# Patient Record
Sex: Female | Born: 1960 | Race: White | Hispanic: No | Marital: Married | Smoking: Never smoker
Health system: Southern US, Community
[De-identification: ages and names within clinical notes are randomized; demographics above are authoritative.]

## PROBLEM LIST (undated history)

## (undated) DIAGNOSIS — C44310 Basal cell carcinoma of skin of unspecified parts of face: Secondary | ICD-10-CM

## (undated) DIAGNOSIS — Z8041 Family history of malignant neoplasm of ovary: Secondary | ICD-10-CM

## (undated) DIAGNOSIS — Z780 Asymptomatic menopausal state: Secondary | ICD-10-CM

## (undated) DIAGNOSIS — N898 Other specified noninflammatory disorders of vagina: Principal | ICD-10-CM

## (undated) DIAGNOSIS — B9689 Other specified bacterial agents as the cause of diseases classified elsewhere: Secondary | ICD-10-CM

## (undated) DIAGNOSIS — N760 Acute vaginitis: Secondary | ICD-10-CM

## (undated) DIAGNOSIS — C4492 Squamous cell carcinoma of skin, unspecified: Secondary | ICD-10-CM

## (undated) DIAGNOSIS — Z808 Family history of malignant neoplasm of other organs or systems: Secondary | ICD-10-CM

## (undated) DIAGNOSIS — Z803 Family history of malignant neoplasm of breast: Secondary | ICD-10-CM

## (undated) DIAGNOSIS — Z923 Personal history of irradiation: Secondary | ICD-10-CM

## (undated) DIAGNOSIS — F329 Major depressive disorder, single episode, unspecified: Principal | ICD-10-CM

## (undated) DIAGNOSIS — Z800 Family history of malignant neoplasm of digestive organs: Secondary | ICD-10-CM

## (undated) DIAGNOSIS — D649 Anemia, unspecified: Secondary | ICD-10-CM

## (undated) DIAGNOSIS — N952 Postmenopausal atrophic vaginitis: Principal | ICD-10-CM

## (undated) HISTORY — DX: Family history of malignant neoplasm of digestive organs: Z80.0

## (undated) HISTORY — DX: Postmenopausal atrophic vaginitis: N95.2

## (undated) HISTORY — DX: Asymptomatic menopausal state: Z78.0

## (undated) HISTORY — DX: Basal cell carcinoma of skin of unspecified parts of face: C44.310

## (undated) HISTORY — PX: BREAST ENHANCEMENT SURGERY: SHX7

## (undated) HISTORY — DX: Personal history of irradiation: Z92.3

## (undated) HISTORY — PX: OTHER SURGICAL HISTORY: SHX169

## (undated) HISTORY — DX: Other specified bacterial agents as the cause of diseases classified elsewhere: B96.89

## (undated) HISTORY — DX: Acute vaginitis: N76.0

## (undated) HISTORY — DX: Major depressive disorder, single episode, unspecified: F32.9

## (undated) HISTORY — DX: Other specified noninflammatory disorders of vagina: N89.8

## (undated) HISTORY — DX: Family history of malignant neoplasm of breast: Z80.3

## (undated) HISTORY — DX: Family history of malignant neoplasm of other organs or systems: Z80.8

## (undated) HISTORY — DX: Squamous cell carcinoma of skin, unspecified: C44.92

## (undated) HISTORY — PX: CARPAL TUNNEL RELEASE: SHX101

## (undated) HISTORY — DX: Family history of malignant neoplasm of ovary: Z80.41

---

## 2001-10-18 ENCOUNTER — Encounter: Payer: Self-pay | Admitting: Family Medicine

## 2001-10-18 ENCOUNTER — Ambulatory Visit (HOSPITAL_COMMUNITY): Admission: RE | Admit: 2001-10-18 | Discharge: 2001-10-18 | Payer: Self-pay | Admitting: Family Medicine

## 2004-12-04 ENCOUNTER — Ambulatory Visit (HOSPITAL_COMMUNITY): Admission: RE | Admit: 2004-12-04 | Discharge: 2004-12-04 | Payer: Self-pay | Admitting: Family Medicine

## 2005-10-21 ENCOUNTER — Ambulatory Visit (HOSPITAL_COMMUNITY): Admission: RE | Admit: 2005-10-21 | Discharge: 2005-10-21 | Payer: Self-pay | Admitting: Family Medicine

## 2006-09-16 ENCOUNTER — Emergency Department (HOSPITAL_COMMUNITY): Admission: EM | Admit: 2006-09-16 | Discharge: 2006-09-16 | Payer: Self-pay | Admitting: Emergency Medicine

## 2006-10-06 ENCOUNTER — Ambulatory Visit (HOSPITAL_COMMUNITY): Admission: RE | Admit: 2006-10-06 | Discharge: 2006-10-06 | Payer: Self-pay | Admitting: Family Medicine

## 2006-10-22 ENCOUNTER — Ambulatory Visit: Payer: Self-pay | Admitting: Orthopaedic Surgery

## 2006-10-26 ENCOUNTER — Ambulatory Visit: Payer: Self-pay | Admitting: Orthopaedic Surgery

## 2008-06-21 ENCOUNTER — Ambulatory Visit (HOSPITAL_COMMUNITY): Admission: RE | Admit: 2008-06-21 | Discharge: 2008-06-21 | Payer: Self-pay | Admitting: Family Medicine

## 2008-10-26 ENCOUNTER — Ambulatory Visit (HOSPITAL_COMMUNITY): Admission: RE | Admit: 2008-10-26 | Discharge: 2008-10-26 | Payer: Self-pay | Admitting: Family Medicine

## 2009-04-25 ENCOUNTER — Encounter: Admission: RE | Admit: 2009-04-25 | Discharge: 2009-04-25 | Payer: Self-pay | Admitting: Family Medicine

## 2010-01-14 ENCOUNTER — Ambulatory Visit (HOSPITAL_COMMUNITY): Admission: RE | Admit: 2010-01-14 | Discharge: 2010-01-14 | Payer: Self-pay | Admitting: Family Medicine

## 2010-04-26 ENCOUNTER — Encounter: Admission: RE | Admit: 2010-04-26 | Discharge: 2010-04-26 | Payer: Self-pay | Admitting: Family Medicine

## 2011-03-03 ENCOUNTER — Ambulatory Visit (HOSPITAL_BASED_OUTPATIENT_CLINIC_OR_DEPARTMENT_OTHER)
Admission: RE | Admit: 2011-03-03 | Discharge: 2011-03-03 | Disposition: A | Discharge status: Home / Self Care | Payer: Managed Care, Other (non HMO) | Source: Ambulatory Visit | Attending: Orthopedic Surgery | Admitting: Orthopedic Surgery

## 2011-03-03 DIAGNOSIS — Z01812 Encounter for preprocedural laboratory examination: Secondary | ICD-10-CM | POA: Insufficient documentation

## 2011-03-03 DIAGNOSIS — G5600 Carpal tunnel syndrome, unspecified upper limb: Secondary | ICD-10-CM | POA: Insufficient documentation

## 2011-03-25 NOTE — Op Note (Signed)
  Melissa Mcgrath, MARION NO.:  [id number]  MEDICAL RECORD NO.:  [id number]  LOCATION:                                 FACILITY:  PHYSICIAN:  Cindee Salt, M.D.       DATE OF BIRTH:  August 23, 1960  DATE OF PROCEDURE:  03/03/2011 DATE OF DISCHARGE:                              OPERATIVE REPORT   PREOPERATIVE DIAGNOSIS:  Carpal tunnel syndrome, right hand.  POSTOPERATIVE DIAGNOSIS:  Carpal tunnel syndrome, right hand.  OPERATION:  Decompression of right median nerve.  SURGEON:  Cindee Salt, MD  ASSISTANT:  None.  ANESTHESIA:  Forearm based IV regional local infiltration.  DATE OF OPERATION:  March 03, 2011  ANESTHESIOLOGIST:  Janetta Hora. Frederick, MD.  HISTORY:  The patient is a 50 year old female with a history of carpal tunnel syndrome, EMG nerve conductions positive which has not responded to conservative treatment.  She has elected to undergo surgical decompression.  Pre, peri, and postoperative course have been discussed along with risks and complications.  She is aware there is no guarantee with the surgery, possibility of infection, recurrence injury to arteries, nerves, tendons incomplete relief of symptoms, dystrophy. Preoperative area, the patient was seen.  The extremity was marked by both the patient and surgeon.  Antibiotic given.  PROCEDURE:  The patient was brought to the operating room where a forearm based IV regional anesthetic was carried out without difficulty. She was prepped using ChloraPrep, supine position, right arm free.  A 3- minute dry time was allowed.  Time-out taken confirming the patient procedure.  A longitudinal incision was made in the palm, carried down through subcutaneous tissue.  Bleeders were electrocauterized.  The palmar fascia was split.  Superficial palmar arch identified.  The flexor tendon to the ring little finger identified to the ulnar side of median nerve.  The carpal retinaculum was incised with sharp  dissection. Right angle and Sewall retractor were placed between skin and forearm fascia.  Fascia was released for approximately a centimeter and half proximal to the wrist crease under direct vision.  Canal was explored. Air compression to the nerve was apparent.  No further lesions were identified.  The wound was irrigated.  The skin was then closed with interrupted 4-0 Vicryl Rapide sutures.  A local infiltration with 0.25% Marcaine without epinephrine was given.  Approximately 5 mL was used.  A sterile compressive dressing was applied with the fingers free.  On deflation of the tourniquet, all fingers immediately pinked.  She was taken to the recovery room for observation in satisfactory condition.  She will be discharged home to return to the [facility] [place] in 1 week on Vicodin.          ______________________________ Cindee Salt, M.D.     GK/MEDQ  D:  03/03/2011  T:  03/04/2011  Job:  [id number]  Electronically Signed by Cindee Salt M.D. on 03/25/2011 04:39:44 PM

## 2011-05-26 ENCOUNTER — Other Ambulatory Visit: Payer: Self-pay | Admitting: Family Medicine

## 2011-05-26 DIAGNOSIS — Z1231 Encounter for screening mammogram for malignant neoplasm of breast: Secondary | ICD-10-CM

## 2011-06-18 ENCOUNTER — Ambulatory Visit: Payer: Managed Care, Other (non HMO)

## 2011-07-03 ENCOUNTER — Ambulatory Visit
Admission: RE | Admit: 2011-07-03 | Discharge: 2011-07-03 | Disposition: A | Discharge status: Home / Self Care | Payer: Managed Care, Other (non HMO) | Source: Ambulatory Visit | Attending: Family Medicine | Admitting: Family Medicine

## 2011-07-03 DIAGNOSIS — Z1231 Encounter for screening mammogram for malignant neoplasm of breast: Secondary | ICD-10-CM

## 2012-10-18 ENCOUNTER — Other Ambulatory Visit: Payer: Self-pay

## 2012-10-18 DIAGNOSIS — Z1231 Encounter for screening mammogram for malignant neoplasm of breast: Secondary | ICD-10-CM

## 2012-11-11 ENCOUNTER — Ambulatory Visit
Admission: RE | Admit: 2012-11-11 | Discharge: 2012-11-11 | Disposition: A | Discharge status: Home / Self Care | Payer: No Typology Code available for payment source | Source: Ambulatory Visit

## 2012-11-11 DIAGNOSIS — Z1231 Encounter for screening mammogram for malignant neoplasm of breast: Secondary | ICD-10-CM

## 2013-03-06 ENCOUNTER — Emergency Department: Payer: Self-pay | Admitting: Emergency Medicine

## 2013-12-20 ENCOUNTER — Encounter: Payer: Self-pay | Admitting: Nurse Practitioner

## 2013-12-20 ENCOUNTER — Ambulatory Visit (INDEPENDENT_AMBULATORY_CARE_PROVIDER_SITE_OTHER): Payer: No Typology Code available for payment source | Admitting: Nurse Practitioner

## 2013-12-20 VITALS — BP 112/76 | Temp 98.4°F | Ht 68.0 in | Wt 153.0 lb

## 2013-12-20 DIAGNOSIS — G609 Hereditary and idiopathic neuropathy, unspecified: Secondary | ICD-10-CM

## 2013-12-20 DIAGNOSIS — R5383 Other fatigue: Principal | ICD-10-CM

## 2013-12-20 DIAGNOSIS — Z13220 Encounter for screening for lipoid disorders: Secondary | ICD-10-CM

## 2013-12-20 DIAGNOSIS — G629 Polyneuropathy, unspecified: Secondary | ICD-10-CM

## 2013-12-20 DIAGNOSIS — R5381 Other malaise: Secondary | ICD-10-CM

## 2013-12-20 DIAGNOSIS — R55 Syncope and collapse: Secondary | ICD-10-CM

## 2013-12-20 DIAGNOSIS — R110 Nausea: Secondary | ICD-10-CM

## 2013-12-20 LAB — HEPATIC FUNCTION PANEL
ALBUMIN: 4.2 g/dL (ref 3.5–5.2)
ALT: 13 U/L (ref 0–35)
AST: 16 U/L (ref 0–37)
Alkaline Phosphatase: 83 U/L (ref 39–117)
Bilirubin, Direct: 0.1 mg/dL (ref 0.0–0.3)
Indirect Bilirubin: 0.4 mg/dL (ref 0.2–1.2)
Total Bilirubin: 0.5 mg/dL (ref 0.2–1.2)
Total Protein: 7 g/dL (ref 6.0–8.3)

## 2013-12-20 LAB — CBC WITH DIFFERENTIAL/PLATELET
BASOS ABS: 0 10*3/uL (ref 0.0–0.1)
BASOS PCT: 1 % (ref 0–1)
EOS ABS: 0.1 10*3/uL (ref 0.0–0.7)
Eosinophils Relative: 2 % (ref 0–5)
HEMATOCRIT: 37.3 % (ref 36.0–46.0)
HEMOGLOBIN: 12.8 g/dL (ref 12.0–15.0)
Lymphocytes Relative: 36 % (ref 12–46)
Lymphs Abs: 1.7 10*3/uL (ref 0.7–4.0)
MCH: 31 pg (ref 26.0–34.0)
MCHC: 34.3 g/dL (ref 30.0–36.0)
MCV: 90.3 fL (ref 78.0–100.0)
MONO ABS: 0.5 10*3/uL (ref 0.1–1.0)
MONOS PCT: 10 % (ref 3–12)
NEUTROS ABS: 2.4 10*3/uL (ref 1.7–7.7)
NEUTROS PCT: 51 % (ref 43–77)
Platelets: 253 10*3/uL (ref 150–400)
RBC: 4.13 MIL/uL (ref 3.87–5.11)
RDW: 13.1 % (ref 11.5–15.5)
WBC: 4.7 10*3/uL (ref 4.0–10.5)

## 2013-12-20 LAB — LIPID PANEL
CHOLESTEROL: 184 mg/dL (ref 0–200)
HDL: 51 mg/dL (ref >39)
LDL Cholesterol: 117 mg/dL — ABNORMAL HIGH (ref 0–99)
TRIGLYCERIDES: 82 mg/dL (ref <150)
Total CHOL/HDL Ratio: 3.6 Ratio
VLDL: 16 mg/dL (ref 0–40)

## 2013-12-20 LAB — BASIC METABOLIC PANEL
BUN: 10 mg/dL (ref 6–23)
CALCIUM: 9.3 mg/dL (ref 8.4–10.5)
CHLORIDE: 105 meq/L (ref 96–112)
CO2: 29 meq/L (ref 19–32)
CREATININE: 0.76 mg/dL (ref 0.50–1.10)
GLUCOSE: 88 mg/dL (ref 70–99)
Potassium: 4.1 mEq/L (ref 3.5–5.3)
Sodium: 142 mEq/L (ref 135–145)

## 2013-12-20 LAB — FOLATE: FOLATE: 18.4 ng/mL

## 2013-12-20 LAB — POCT URINE PREGNANCY: PREG TEST UR: NEGATIVE

## 2013-12-20 LAB — VITAMIN B12: VITAMIN B 12: 380 pg/mL (ref 211–911)

## 2013-12-20 LAB — TSH: TSH: 1.932 u[IU]/mL (ref 0.350–4.500)

## 2013-12-20 NOTE — Patient Instructions (Signed)
Start daily MVI Zantac 150 mg one at night

## 2013-12-21 LAB — VITAMIN D 25 HYDROXY (VIT D DEFICIENCY, FRACTURES): VIT D 25 HYDROXY: 64 ng/mL (ref 30–89)

## 2013-12-23 ENCOUNTER — Encounter: Payer: Self-pay | Admitting: Nurse Practitioner

## 2013-12-24 ENCOUNTER — Encounter: Payer: Self-pay | Admitting: Nurse Practitioner

## 2013-12-24 NOTE — Progress Notes (Signed)
Subjective:  Presents with complaints of prolonged fatigue over the past year. Has had 2 episodes of syncope/near-syncope. Did not have symptoms of heat stroke. Possible heat exhaustion. The first episode was about a year ago while she was at a horse show occurred after eating and drinking. Began feeling faint, "could not hear",.in her vision. According to patient she fainted briefly. Symptoms improved after sitting in air conditioning. The last episode was 3-4 weeks ago at another or show. Felt hot and clammy with Dotson her vision again. Did not pass out this time. Had been eating and drinking that day as well. No menstrual cycle, patient had her Nexplanon removed. No headaches. No fevers. Nausea but no vomiting mainly in the mornings. No weakness of the face arms or legs. No difficulty speaking or swallowing. No chest pain/ischemic type pain shortness of breath or palpitations. Some cold intolerance. Off-and-on pain in both feet, slight numbness on the left side for the past 3-4 weeks. No difficulty walking. Denies back pain. 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 Has been under a lot more stress due to her husband's health issues. Social isolation. Lack of interest in her usual activities including her horses which is very unusual. No abdominal pain. No acid reflux or heartburn.  Objective:   BP 112/76  Temp(Src) 98.4 F (36.9 C) (Oral)  Ht 5\' 8"  (1.727 m)  Wt 153 lb (69.4 kg)  BMI 23.27 kg/m2 NAD. Alert, oriented. Thyroid normal limit to palpation and nontender. Lungs clear. Heart regular rate rhythm. Lower extremities no edema. Strong DP pulses bilaterally. Toes warm with good capillary refill. Decreased sensation on the third and fourth toes left foot with monofilament test. Abdomen soft nondistended nontender.  Assessment: Other malaise and fatigue - Plan: Basic metabolic panel, CBC with Differential, Hepatic function panel, TSH, Vitamin B12, Vit D  25  hydroxy (rtn osteoporosis monitoring), Folate  Peripheral neuropathy - Plan: CBC with Differential, Vitamin B12, Vit D  25 hydroxy (rtn osteoporosis monitoring), Folate  Screening for lipid disorders - Plan: Lipid panel  Nausea alone - Plan: POCT urine pregnancy  Plan: Labs pending. Recommend daily multivitamin. Feel that stress is contributing to her malaise and fatigue. Consider starting daily medication if lab work is normal. Trial of Zantac to see if this will help nausea. Warning signs reviewed. Call back sooner if symptoms worsen.

## 2013-12-27 ENCOUNTER — Encounter: Payer: Self-pay | Admitting: Nurse Practitioner

## 2014-01-02 ENCOUNTER — Ambulatory Visit: Payer: No Typology Code available for payment source | Admitting: Nurse Practitioner

## 2014-01-05 ENCOUNTER — Ambulatory Visit (INDEPENDENT_AMBULATORY_CARE_PROVIDER_SITE_OTHER): Payer: No Typology Code available for payment source | Admitting: Adult Health

## 2014-01-05 ENCOUNTER — Encounter: Payer: Self-pay | Admitting: Adult Health

## 2014-01-05 VITALS — BP 102/70 | Ht 68.0 in | Wt 154.5 lb

## 2014-01-05 DIAGNOSIS — F3289 Other specified depressive episodes: Secondary | ICD-10-CM

## 2014-01-05 DIAGNOSIS — N951 Menopausal and female climacteric states: Secondary | ICD-10-CM

## 2014-01-05 DIAGNOSIS — F32A Depression, unspecified: Secondary | ICD-10-CM

## 2014-01-05 DIAGNOSIS — Z780 Asymptomatic menopausal state: Secondary | ICD-10-CM

## 2014-01-05 DIAGNOSIS — F329 Major depressive disorder, single episode, unspecified: Secondary | ICD-10-CM

## 2014-01-05 HISTORY — DX: Asymptomatic menopausal state: Z78.0

## 2014-01-05 HISTORY — DX: Depression, unspecified: F32.A

## 2014-01-05 MED ORDER — ESCITALOPRAM OXALATE 10 MG PO TABS: 10.0000 mg | ORAL_TABLET | Freq: Every day | ORAL | Status: DC

## 2014-01-05 NOTE — Progress Notes (Signed)
Subjective:     Patient ID: Melissa Mcgrath, female   DOB: 10/20/1960, 53 y.o.   MRN: [id number]  HPI Melissa Mcgrath is a 53 year old white female, married, in complaining of no energy, decrease interest in things she used to like, even her horses and does not want to go places, would rather be home,decrease libido,teary easily and "ill" at times, esp with husband, and her feet hurt.also nausea in am.She denies hot flashes, or night sweats, is actually more cold.No problems sleeping or with memory.Denies chest pain or shortness of breath, has had episodes at horse show where feels faint and did faint once. She has not had a period since nexplanon removed 1 year ago.She works on a farm and Advertising account executive.Her husband has been retired 3 years and has had health issues.She has 2 grown daughters and they are well.She has seen C.Hoskins NP 12/20/13 and had labs that were normal.  Review of Systems See HPI Reviewed past medical,surgical, social and family history. Reviewed medications and allergies.     Objective:   Physical Exam BP 102/70  Ht 5\' 8"  (1.727 m)  Wt 154 lb 8 oz (70.081 kg)  BMI 23.50 kg/m2   Skin warm and dry and tan, feet appear normal no lesions but she says they hurt and cramp, Hoyle Sauer just did exam, feels better in boots or flip flops.Talked about depression/stress and menopause.Discussed HRT and also SSRIs.since she is not having hot flashes,or night sweats, will try lexapro first, but if not better HRT is still option, talked about taking time for self, talk with husband, use good lubricate with sex,try stretches and rolling tennis ball and frozen water bottle for feet several times daily.Don't eat late at night then lay down.And if symptoms persist may get cardiac work up, just because of family history. Spent over 30 minutes talking face to face.  Assessment:     Depression Menopause     Plan:     Rx lexapro 10 mg #30 1 daily with 6 refills Follow up in  4 weeks Review  handout on depression and menopause   Call with any question and concerns

## 2014-01-05 NOTE — Patient Instructions (Signed)
Menopause Menopause is the normal time of life when menstrual periods stop completely. Menopause is complete when you have missed 12 consecutive menstrual periods. It usually occurs between the ages of 48 years and 55 years. Very rarely does a woman develop menopause before the age of 40 years. At menopause, your ovaries stop producing the female hormones estrogen and progesterone. This can cause undesirable symptoms and also affect your health. Sometimes the symptoms may occur 4-5 years before the menopause begins. There is no relationship between menopause and:  Oral contraceptives.  Number of children you had.  Race.  The age your menstrual periods started (menarche). Heavy smokers and very thin women may develop menopause earlier in life. CAUSES  The ovaries stop producing the female hormones estrogen and progesterone.  Other causes include:  Surgery to remove both ovaries.  The ovaries stop functioning for no known reason.  Tumors of the pituitary gland in the brain.  Medical disease that affects the ovaries and hormone production.  Radiation treatment to the abdomen or pelvis.  Chemotherapy that affects the ovaries. SYMPTOMS   Hot flashes.  Night sweats.  Decrease in sex drive.  Vaginal dryness and thinning of the vagina causing painful intercourse.  Dryness of the skin and developing wrinkles.  Headaches.  Tiredness.  Irritability.  Memory problems.  Weight gain.  Bladder infections.  Hair growth of the face and chest.  Infertility. More serious symptoms include:  Loss of bone (osteoporosis) causing breaks (fractures).  Depression.  Hardening and narrowing of the arteries (atherosclerosis) causing heart attacks and strokes. DIAGNOSIS   When the menstrual periods have stopped for 12 straight months.  Physical exam.  Hormone studies of the blood. TREATMENT  There are many treatment choices and nearly as many questions about them. The  decisions to treat or not to treat menopausal changes is an individual choice made with your health care provider. Your health care provider can discuss the treatments with you. Together, you can decide which treatment will work best for you. Your treatment choices may include:   Hormone therapy (estrogen and progesterone).  Non-hormonal medicines.  Treating the individual symptoms with medicine (for example antidepressants for depression).  Herbal medicines that may help specific symptoms.  Counseling by a psychiatrist or psychologist.  Group therapy.  Lifestyle changes including:  Eating healthy.  Regular exercise.  Limiting caffeine and alcohol.  Stress management and meditation.  No treatment. HOME CARE INSTRUCTIONS   Take the medicine your health care provider gives you as directed.  Get plenty of sleep and rest.  Exercise regularly.  Eat a diet that contains calcium (good for the bones) and soy products (acts like estrogen hormone).  Avoid alcoholic beverages.  Do not smoke.  If you have hot flashes, dress in layers.  Take supplements, calcium, and vitamin D to strengthen bones.  You can use over-the-counter lubricants or moisturizers for vaginal dryness.  Group therapy is sometimes very helpful.  Acupuncture may be helpful in some cases. SEEK MEDICAL CARE IF:   You are not sure you are in menopause.  You are having menopausal symptoms and need advice and treatment.  You are still having menstrual periods after age 55 years.  You have pain with intercourse.  Menopause is complete (no menstrual period for 12 months) and you develop vaginal bleeding.  You need a referral to a specialist (gynecologist, psychiatrist, or psychologist) for treatment. SEEK IMMEDIATE MEDICAL CARE IF:   You have severe depression.  You have excessive vaginal bleeding.    You fell and think you have a broken bone.  You have pain when you urinate.  You develop leg or  chest pain.  You have a fast pounding heart beat (palpitations).  You have severe headaches.  You develop vision problems.  You feel a lump in your breast.  You have abdominal pain or severe indigestion. Document Released: 08/30/2003 Document Revised: 02/09/2013 Document Reviewed: 01/06/2013 [facility] Patient Information 2015 [place], [place]. This information is not intended to replace advice given to you by your health care provider. Make sure you discuss any questions you have with your health care provider. Depression, Adult Depression refers to feeling sad, low, down in the dumps, blue, gloomy, or empty. In general, there are two kinds of depression: 1. Depression that we all experience from time to time because of upsetting life experiences, including the loss of a job or the ending of a relationship (normal sadness or normal grief). This kind of depression is considered normal, is short lived, and resolves within a few days to 2 weeks. (Depression experienced after the loss of a loved one is called bereavement. Bereavement often lasts longer than 2 weeks but normally gets better with time.) 2. Clinical depression, which lasts longer than normal sadness or normal grief or interferes with your ability to function at home, at work, and in school. It also interferes with your personal relationships. It affects almost every aspect of your life. Clinical depression is an illness. Symptoms of depression also can be caused by conditions other than normal sadness and grief or clinical depression. Examples of these conditions are listed as follows:  Physical illness--Some physical illnesses, including underactive thyroid gland (hypothyroidism), severe anemia, specific types of cancer, diabetes, uncontrolled seizures, heart and lung problems, strokes, and chronic pain are commonly associated with symptoms of depression.  Side effects of some prescription medicine--In some people, certain types of  prescription medicine can cause symptoms of depression.  Substance abuse--Abuse of alcohol and illicit drugs can cause symptoms of depression. SYMPTOMS Symptoms of normal sadness and normal grief include the following:  Feeling sad or crying for short periods of time.  Not caring about anything (apathy).  Difficulty sleeping or sleeping too much.  No longer able to enjoy the things you used to enjoy.  Desire to be by oneself all the time (social isolation).  Lack of energy or motivation.  Difficulty concentrating or remembering.  Change in appetite or weight.  Restlessness or agitation. Symptoms of clinical depression include the same symptoms of normal sadness or normal grief and also the following symptoms:  Feeling sad or crying all the time.  Feelings of guilt or worthlessness.  Feelings of hopelessness or helplessness.  Thoughts of suicide or the desire to harm yourself (suicidal ideation).  Loss of touch with reality (psychotic symptoms). Seeing or hearing things that are not real (hallucinations) or having false beliefs about your life or the people around you (delusions and paranoia). DIAGNOSIS  The diagnosis of clinical depression usually is based on the severity and duration of the symptoms. Your caregiver also will ask you questions about your medical history and substance use to find out if physical illness, use of prescription medicine, or substance abuse is causing your depression. Your caregiver also may order blood tests. TREATMENT  Typically, normal sadness and normal grief do not require treatment. However, sometimes antidepressant medicine is prescribed for bereavement to ease the depressive symptoms until they resolve. The treatment for clinical depression depends on the severity of your symptoms but  typically includes antidepressant medicine, counseling with a mental health professional, or a combination of both. Your caregiver will help to determine what  treatment is best for you. Depression caused by physical illness usually goes away with appropriate medical treatment of the illness. If prescription medicine is causing depression, talk with your caregiver about stopping the medicine, decreasing the dose, or substituting another medicine. Depression caused by abuse of alcohol or illicit drugs abuse goes away with abstinence from these substances. Some adults need professional help in order to stop drinking or using drugs. SEEK IMMEDIATE CARE IF:  You have thoughts about hurting yourself or others.  You lose touch with reality (have psychotic symptoms).  You are taking medicine for depression and have a serious side effect. FOR MORE INFORMATION National Alliance on Mental Illness: www.nami.[employer] of Mental Health: https://carter.com/ Document Released: 06/06/2000 Document Revised: 12/09/2011 Document Reviewed: 09/08/2011 [facility] Patient Information 2015 [place], [place]. This information is not intended to replace advice given to you by your health care provider. Make sure you discuss any questions you have with your health care provider. Start lexapro Return in 4 weeks

## 2014-01-26 ENCOUNTER — Encounter: Payer: Self-pay | Admitting: Adult Health

## 2014-02-02 ENCOUNTER — Ambulatory Visit: Payer: No Typology Code available for payment source | Admitting: Adult Health

## 2014-04-24 ENCOUNTER — Encounter: Payer: Self-pay | Admitting: Adult Health

## 2014-05-25 ENCOUNTER — Encounter: Payer: Self-pay | Admitting: Adult Health

## 2014-09-15 ENCOUNTER — Encounter: Payer: Self-pay | Admitting: Nurse Practitioner

## 2014-09-15 ENCOUNTER — Ambulatory Visit (INDEPENDENT_AMBULATORY_CARE_PROVIDER_SITE_OTHER): Payer: 59 | Admitting: Nurse Practitioner

## 2014-09-15 VITALS — BP 118/80 | Temp 100.0°F | Ht 68.0 in | Wt 147.8 lb

## 2014-09-15 DIAGNOSIS — J111 Influenza due to unidentified influenza virus with other respiratory manifestations: Secondary | ICD-10-CM | POA: Diagnosis not present

## 2014-09-15 MED ORDER — OSELTAMIVIR PHOSPHATE 75 MG PO CAPS: 75.0000 mg | ORAL_CAPSULE | Freq: Two times a day (BID) | ORAL | Status: DC

## 2014-09-15 MED ORDER — ONDANSETRON 8 MG PO TBDP: 8.0000 mg | ORAL_TABLET | Freq: Three times a day (TID) | ORAL | Status: DC | PRN

## 2014-09-18 ENCOUNTER — Encounter: Payer: Self-pay | Admitting: Nurse Practitioner

## 2014-09-18 NOTE — Progress Notes (Signed)
Subjective:  Presents for complaints of fever chills headache cough muscle aches and fatigue that began less than 48 hours ago. No wheezing. No sore throat or ear pain. Nausea, no vomiting or diarrhea. No abdominal pain. Taking fluids well. Voiding normal limit.  Objective:   BP 118/80 mmHg  Temp(Src) 100 F (37.8 C) (Oral)  Ht 5\' 8"  (1.727 m)  Wt 147 lb 12.8 oz (67.042 kg)  BMI 22.48 kg/m2 NAD. Alert, oriented. Fatigued in appearance. TMs clear effusion, no erythema. Pharynx clear. Neck supple with mild soft anterior adenopathy. Lungs clear. Heart regular rate rhythm. Abdomen soft nondistended nontender.  Assessment: Influenza  Plan:  Meds ordered this encounter  Medications  . oseltamivir (TAMIFLU) 75 MG capsule    Sig: Take 1 capsule (75 mg total) by mouth 2 (two) times daily.    Dispense:  10 capsule    Refill:  0    Order Specific Question:  Supervising Provider    Answer:  Mikey Kirschner [[id number]]  . ondansetron (ZOFRAN-ODT) 8 MG disintegrating tablet    Sig: Take 1 tablet (8 mg total) by mouth every 8 (eight) hours as needed for nausea or vomiting.    Dispense:  20 tablet    Refill:  0    Order Specific Question:  Supervising Provider    Answer:  Mikey Kirschner [[id number]]   Reviewed symptomatic care and warning signs. Call back in 72 hours if no improvement, call or go to ED over the weekend if worse.

## 2014-09-27 ENCOUNTER — Encounter: Payer: Self-pay | Admitting: Nurse Practitioner

## 2014-09-28 ENCOUNTER — Telehealth: Payer: Self-pay | Admitting: Family Medicine

## 2014-09-28 MED ORDER — TRIAMCINOLONE ACETONIDE 0.1 % EX CREA: 1.0000 "application " | TOPICAL_CREAM | Freq: Four times a day (QID) | CUTANEOUS | Status: DC

## 2014-09-28 MED ORDER — FLUCONAZOLE 150 MG PO TABS: 150.0000 mg | ORAL_TABLET | Freq: Once | ORAL | Status: DC

## 2014-09-28 NOTE — Telephone Encounter (Signed)
Per Dr. Nicki Reaper send in Diflucan per protocol and Triamcinolone cream 1% Apply QID prn 60 grams. Do warm sitz baths. Patient verbalized understanding. Med sent to pharmacy.

## 2014-09-28 NOTE — Telephone Encounter (Signed)
Pt is needing diflucan and something for hemmoroids called in.  [facility]

## 2014-10-09 ENCOUNTER — Encounter: Payer: Self-pay | Admitting: Nurse Practitioner

## 2014-10-16 ENCOUNTER — Telehealth: Payer: Self-pay | Admitting: Family Medicine

## 2014-10-16 ENCOUNTER — Other Ambulatory Visit: Payer: Self-pay | Admitting: *Deleted

## 2014-10-16 MED ORDER — CLOTRIMAZOLE 1 % VA CREA: TOPICAL_CREAM | VAGINAL | Status: DC

## 2014-10-16 MED ORDER — FLUCONAZOLE 150 MG PO TABS: ORAL_TABLET | ORAL | Status: DC

## 2014-10-16 NOTE — Telephone Encounter (Signed)
Patient has been treated by Hoyle Sauer for a yeast infection since March.  She has been through 5 rounds of diflucan and even a medicated douche.  She said she does not feel comfortable seeing anyone but Hoyle Sauer for this.  She wants to know if Dr. Richardson Landry has a recommendation for her since Hoyle Sauer is out of the office today?

## 2014-10-16 NOTE — Telephone Encounter (Signed)
clotrimazol 500mg   supp not available change to clotrimazol 1% cream one app daily for 7 days per dr Richardson Landry. diflcan sent to pharm. Pt notified.

## 2014-10-16 NOTE — Telephone Encounter (Signed)
clotrimazol 500 mg supp intravag weekly for a month  Diflucan 150 mg wkly for two months  Did not see any pelvic exam by anyone recently, if persists will need ov with carolyn or jennifer griffin (sees her too)

## 2014-10-27 ENCOUNTER — Ambulatory Visit (INDEPENDENT_AMBULATORY_CARE_PROVIDER_SITE_OTHER): Payer: 59 | Admitting: Adult Health

## 2014-10-27 ENCOUNTER — Encounter: Payer: Self-pay | Admitting: Adult Health

## 2014-10-27 VITALS — BP 100/70 | HR 68 | Ht 68.0 in | Wt 145.0 lb

## 2014-10-27 DIAGNOSIS — N760 Acute vaginitis: Secondary | ICD-10-CM | POA: Diagnosis not present

## 2014-10-27 DIAGNOSIS — B9689 Other specified bacterial agents as the cause of diseases classified elsewhere: Secondary | ICD-10-CM

## 2014-10-27 DIAGNOSIS — F329 Major depressive disorder, single episode, unspecified: Secondary | ICD-10-CM | POA: Diagnosis not present

## 2014-10-27 DIAGNOSIS — L298 Other pruritus: Secondary | ICD-10-CM

## 2014-10-27 DIAGNOSIS — A499 Bacterial infection, unspecified: Secondary | ICD-10-CM | POA: Diagnosis not present

## 2014-10-27 DIAGNOSIS — F32A Depression, unspecified: Secondary | ICD-10-CM

## 2014-10-27 DIAGNOSIS — N898 Other specified noninflammatory disorders of vagina: Secondary | ICD-10-CM

## 2014-10-27 HISTORY — DX: Other specified noninflammatory disorders of vagina: N89.8

## 2014-10-27 HISTORY — DX: Other specified bacterial agents as the cause of diseases classified elsewhere: B96.89

## 2014-10-27 LAB — POCT WET PREP (WET MOUNT): WBC, Wet Prep HPF POC: POSITIVE

## 2014-10-27 MED ORDER — NYSTATIN 100000 UNIT/GM EX CREA: 1.0000 "application " | TOPICAL_CREAM | Freq: Two times a day (BID) | CUTANEOUS | Status: DC

## 2014-10-27 MED ORDER — ESCITALOPRAM OXALATE 10 MG PO TABS: ORAL_TABLET | ORAL | Status: DC

## 2014-10-27 MED ORDER — METRONIDAZOLE 0.75 % VA GEL: 1.0000 | Freq: Every day | VAGINAL | Status: DC

## 2014-10-27 NOTE — Patient Instructions (Signed)
Bacterial Vaginosis Bacterial vaginosis is a vaginal infection that occurs when the normal balance of bacteria in the vagina is disrupted. It results from an overgrowth of certain bacteria. This is the most common vaginal infection in women of childbearing age. Treatment is important to prevent complications, especially in pregnant women, as it can cause a premature delivery. CAUSES  Bacterial vaginosis is caused by an increase in harmful bacteria that are normally present in smaller amounts in the vagina. Several different kinds of bacteria can cause bacterial vaginosis. However, the reason that the condition develops is not fully understood. RISK FACTORS Certain activities or behaviors can put you at an increased risk of developing bacterial vaginosis, including:  Having a new sex partner or multiple sex partners.  Douching.  Using an intrauterine device (IUD) for contraception. Women do not get bacterial vaginosis from toilet seats, bedding, swimming pools, or contact with objects around them. SIGNS AND SYMPTOMS  Some women with bacterial vaginosis have no signs or symptoms. Common symptoms include:  Grey vaginal discharge.  A fishlike odor with discharge, especially after sexual intercourse.  Itching or burning of the vagina and vulva.  Burning or pain with urination. DIAGNOSIS  Your health care provider will take a medical history and examine the vagina for signs of bacterial vaginosis. A sample of vaginal fluid may be taken. Your health care provider will look at this sample under a microscope to check for bacteria and abnormal cells. A vaginal pH test may also be done.  TREATMENT  Bacterial vaginosis may be treated with antibiotic medicines. These may be given in the form of a pill or a vaginal cream. A second round of antibiotics may be prescribed if the condition comes back after treatment.  HOME CARE INSTRUCTIONS   Only take over-the-counter or prescription medicines as  directed by your health care provider.  If antibiotic medicine was prescribed, take it as directed. Make sure you finish it even if you start to feel better.  Do not have sex until treatment is completed.  Tell all sexual partners that you have a vaginal infection. They should see their health care provider and be treated if they have problems, such as a mild rash or itching.  Practice safe sex by using condoms and only having one sex partner. SEEK MEDICAL CARE IF:   Your symptoms are not improving after 3 days of treatment.  You have increased discharge or pain.  You have a fever. MAKE SURE YOU:   Understand these instructions.  Will watch your condition.  Will get help right away if you are not doing well or get worse. FOR MORE INFORMATION  Centers for Disease Control and Prevention, Division of STD Prevention: AppraiserFraud.fi American Sexual Health Association (ASHA): www.ashastd.org  Document Released: 06/09/2005 Document Revised: 03/30/2013 Document Reviewed: 01/19/2013 [facility] Patient Information 2015 [place], [place]. This information is not intended to replace advice given to you by your health care provider. Make sure you discuss any questions you have with your health care provider. Use metrogel at HS x 5 nights Follow up in 2 weks

## 2014-10-27 NOTE — Progress Notes (Signed)
Subjective:     Patient ID: Melissa Mcgrath, female   DOB: [birth year], 54 y.o.   MRN: [id number]  HPI Melissa Mcgrath is a 54 year old white female, married in complaining of vaginal itching since march when had the flu and took tamiflu and then had sinus infection and took Augmentin.She was treated with diflucan and OTC Monistat and is still itching, no discharge.Stooped her lexapro when sick but wants it back has no patience now.  Review of Systems Vaginal itch, no patience,hx depression, all other systems negative  Reviewed past medical,surgical, social and family history. Reviewed medications and allergies.     Objective:   Physical Exam BP 100/70 mmHg  Pulse 68  Ht 5\' 8"  (1.727 m)  Wt 145 lb (65.772 kg)  BMI 22.05 kg/m2   Skin warm and dry.Pelvic: external genitalia is normal in appearance no lesions, vagina: yellowish discharge with odor, has decreased color, moisture and rugae,urethra has no lesions or masses noted, cervix:smooth, uterus: normal size, shape and contour, non tender, no masses felt, adnexa: no masses or tenderness noted. Bladder is non tender and no masses felt. Wet prep: + for clue cells and +WBCs.  Assessment:     Vaginal itch BV  Depression    Plan:     Rx metrogel use 1 applicator at hs x 5 nites with 1 refill Rx nystatin cream to use 2-3 x daily prn with kenalog Rx lexapro 10 mg #60 take 2 daily with 6 refills Return in 2 week for follow up if still complaining may add vaginal estrogen Review handout on BV

## 2014-11-09 ENCOUNTER — Ambulatory Visit (INDEPENDENT_AMBULATORY_CARE_PROVIDER_SITE_OTHER): Payer: 59 | Admitting: Adult Health

## 2014-11-09 ENCOUNTER — Encounter: Payer: Self-pay | Admitting: Adult Health

## 2014-11-09 VITALS — BP 96/78 | HR 76 | Ht 68.0 in | Wt 150.0 lb

## 2014-11-09 DIAGNOSIS — F329 Major depressive disorder, single episode, unspecified: Secondary | ICD-10-CM

## 2014-11-09 DIAGNOSIS — N952 Postmenopausal atrophic vaginitis: Secondary | ICD-10-CM | POA: Diagnosis not present

## 2014-11-09 DIAGNOSIS — F32A Depression, unspecified: Secondary | ICD-10-CM

## 2014-11-09 HISTORY — DX: Postmenopausal atrophic vaginitis: N95.2

## 2014-11-09 MED ORDER — ESTRADIOL 0.1 MG/GM VA CREA: TOPICAL_CREAM | VAGINAL | Status: DC

## 2014-11-09 NOTE — Progress Notes (Signed)
Subjective:     Patient ID: Melissa Mcgrath, female   DOB: 09/12/1960, 54 y.o.   MRN: [id number]  HPI Yasmene is a 54 year old white female back in follow up of vaginal itching and BV and is some better, but as we discussed last visit may add vaginal estrogen to see if helps, and she is feeling better back on lexapro.  Review of Systems Patient denies any headaches, hearing loss, fatigue, blurred vision, shortness of breath, chest pain, abdominal pain, problems with bowel movements, urination, or intercourse(not having sex). No joint pain or mood swings.See HPI for positives . Reviewed past medical,surgical, social and family history. Reviewed medications and allergies.     Objective:   Physical Exam BP 96/78 mmHg  Pulse 76  Ht 5\' 8"  (1.727 m)  Wt 150 lb (68.04 kg)  BMI 22.81 kg/m2   Skin warm and dry.Pelvic: external genitalia is normal in appearance no lesions, vagina: has decreased color, moisture and rugae,urethra has no lesions or masses noted, cervix:smooth, uterus: normal size, shape and contour, non tender, no masses felt, adnexa: no masses or tenderness noted. Bladder is non tender and no masses felt.  Assessment:     Vaginal atrophy Depression     Plan:     Continue lexapro Rx estrace vaginal cream use 1 gm bid x 2 weeks in vagina then 2-3 x weekly as needed 24 gms given lot [id number] exp 9/18 Can try luvena and use astro gilde for sex and try different positions Follow up prn

## 2014-11-09 NOTE — Patient Instructions (Signed)
Use estrace cream Try luvna  use astro glide with sex Follow up prn

## 2014-11-10 ENCOUNTER — Ambulatory Visit: Payer: 59 | Admitting: Adult Health

## 2014-12-14 ENCOUNTER — Ambulatory Visit (INDEPENDENT_AMBULATORY_CARE_PROVIDER_SITE_OTHER): Payer: 59 | Admitting: Family Medicine

## 2014-12-14 ENCOUNTER — Encounter: Payer: Self-pay | Admitting: Family Medicine

## 2014-12-14 VITALS — BP 110/70 | Ht 68.0 in | Wt 144.4 lb

## 2014-12-14 DIAGNOSIS — M7550 Bursitis of unspecified shoulder: Secondary | ICD-10-CM

## 2014-12-14 NOTE — Progress Notes (Signed)
   Subjective:    Patient ID: Melissa Mcgrath, female    DOB: 1961/05/11, 54 y.o.   MRN: [id number]  Shoulder Pain  The pain is present in the right shoulder, right arm, right elbow and right hand. This is a new problem. The current episode started 1 to 4 weeks ago. The problem occurs constantly. The problem has been unchanged. The quality of the pain is described as aching and dull. The pain is at a severity of 4/10. The pain is moderate. The symptoms are aggravated by activity. She has tried NSAIDS and acetaminophen for the symptoms. The treatment provided no relief.   Patient states that she has no other concerns at this time.   Shoulder pain kicked in about a month ago  Deep tooth achey feeling  Five gallons bucket of wter  Three aleave of advil or aleve every six hrs  No hx of shoulder   aching  Sleeps ok Lays on left side    Review of Systems No headache no neck pain no chest pain no back pain    Objective:   Physical Exam  Alert vitals stable no acute distress lungs clear heart rare rhythm right shoulder good range of motion distinct deltoid tenderness to deep palpation      Assessment & Plan:  Impression deltoid bursitis discussed plan patient injected. Patient prepped draped injected. Tolerated procedure well. Local measures discussed range of motion exercises discussed WSL

## 2014-12-16 MED ORDER — METHYLPREDNISOLONE ACETATE 40 MG/ML IJ SUSP: 40.0000 mg | Freq: Once | INTRAMUSCULAR | Status: DC

## 2016-07-08 ENCOUNTER — Encounter: Payer: Self-pay | Admitting: Family Medicine

## 2016-07-14 ENCOUNTER — Telehealth: Payer: Self-pay | Admitting: Family Medicine

## 2016-07-14 NOTE — Telephone Encounter (Signed)
Review results to screening mammogram.

## 2017-04-01 ENCOUNTER — Ambulatory Visit (INDEPENDENT_AMBULATORY_CARE_PROVIDER_SITE_OTHER): Payer: Self-pay

## 2017-04-01 ENCOUNTER — Encounter (INDEPENDENT_AMBULATORY_CARE_PROVIDER_SITE_OTHER): Payer: Self-pay | Admitting: Orthopaedic Surgery

## 2017-04-01 ENCOUNTER — Ambulatory Visit (INDEPENDENT_AMBULATORY_CARE_PROVIDER_SITE_OTHER): Payer: Self-pay | Admitting: Orthopaedic Surgery

## 2017-04-01 VITALS — BP 107/67 | HR 61 | Resp 12 | Ht 66.0 in | Wt 134.0 lb

## 2017-04-01 DIAGNOSIS — M542 Cervicalgia: Secondary | ICD-10-CM

## 2017-04-01 DIAGNOSIS — M25511 Pain in right shoulder: Secondary | ICD-10-CM

## 2017-04-01 DIAGNOSIS — G8929 Other chronic pain: Secondary | ICD-10-CM

## 2017-04-01 NOTE — Progress Notes (Signed)
Office Visit Note   Patient: Melissa Mcgrath           Date of Birth: June 22, 1961           MRN: [id number] Visit Date: 04/01/2017              Requested by: Melissa Mcgrath, [address] [place], [place] [address] PCP: Melissa Kirschner, MD   Assessment & Plan: Visit Diagnoses:  1. Neck pain   2. Chronic right shoulder pain   3. Cervicalgia     Plan: Physical therapy at [facility] for cervical spine arthritis. Robaxin as muscle relaxant. Continue NSAIDs as needed. Follow up 6-8 weeks if necessary. Long discussion regarding osteoarthritis of cervical spine and early arthritis of right shoulder which is minimally symptomatic Follow-Up Instructions: Return if symptoms worsen or fail to improve.   Orders:  Orders Placed This Encounter  Procedures  . XR Shoulder Right  . XR Cervical Spine 2 or 3 views  . Ambulatory referral to Physical Therapy   No orders of the defined types were placed in this encounter.     Procedures: No procedures performed   Clinical Data: No additional findings.   Subjective: Chief Complaint  Patient presents with  . Neck - Pain    Melissa Mcgrath is a 56 y o here for neck and r shoulder pain.  . Right Shoulder - Pain    Pain radiates into her R arm. Pt uses ice, heat,tylenol. Aleve. She rides a [employer].  Melissa Mcgrath noted insidious onset of neck pain approximately 2 years ago. 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 Pain does come and go and rarely does it refer to her right shoulder. Certainly is no pain referred to either upper or lower extremity further distally. Tylenol seems to make a difference. She does have some difficulty sleeping on the left side as she's had some neck pain in that position. She's not had any headaches. Denies any numbness or tingling. Her neck has been stiff and sore. She had a CT scan of her  neck several years ago  Revealing  some degenerative changes. She also has some chronic problems with her back with a history of "scoliosis" and has had some injections. The right shoulder pain head is minimally uncomfortable without a loss of motion. She's not sure if it isn't related to her cervical spine. No history of injury or trauma. No prior treatment for her cervical spine or shoulder  HPI  Review of Systems  Constitutional: Negative for chills, fatigue and fever.  Eyes: Negative for itching.  Respiratory: Negative for chest tightness and shortness of breath.   Cardiovascular: Negative for chest pain, palpitations and leg swelling.  Gastrointestinal: Negative for blood in stool, constipation and diarrhea.  Endocrine: Negative for polyuria.  Genitourinary: Negative for dysuria.  Musculoskeletal: Positive for neck pain and neck stiffness. Negative for back pain and joint swelling.  Allergic/Immunologic: Negative for immunocompromised state.  Neurological: Negative for dizziness and numbness.  Hematological: Does not bruise/bleed easily.  Psychiatric/Behavioral: The patient is not nervous/anxious.      Objective: Vital Signs: BP 107/67   Pulse 61   Resp 12   Ht 5\' 6"  (1.676 m)   Wt 134 lb (60.8 kg)   BMI 21.63 kg/m   Physical Exam  Ortho Exam awake alert and oriented  3. Comfortable sitting. Barely able to touch her chin to her chest was some posterior neck discomfort. Only about 50% of normal  Neck extension related to posterior cervical spine pain. No referred pain to either shoulder. Painless range of motion of right shoulder except with minimal impingement testing. Full overhead quick motion. Able to touch the middle of her back with her hand. No distal edema. Neurovascular exam intact. Good strength. Biceps intact. No localized areas of tenderness about the shoulder No specialty comments available.  Imaging: Xr Cervical Spine 2 Or 3 Views  Result Date: 04/01/2017 Films  of the cervical spine obtained in the AP lateral projections. There is straightening of the normal lower lordotic curve. There are degenerative changes in the disc space at C4-5 C5-6 and C6-7. No listhesis.  Xr Shoulder Right  Result Date: 04/01/2017 Films of the right shoulder obtained in 2 projections standing. There appears to be a very small inferior humeral head spur. No subchondral sclerosis. Joint spaces glenohumeral joint appears to be intact. Normal space between the humeral head and acromion. No ectopic calcification. 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 Vaginal atrophy 11/09/2014  . Vaginal itching 10/27/2014  . BV (bacterial vaginosis) 10/27/2014  . Depression 01/05/2014  . Menopause 01/05/2014   Past Medical History:  Diagnosis Date  . BV (bacterial vaginosis) 10/27/2014  . Depression 01/05/2014  . Menopause 01/05/2014  . Vaginal atrophy 11/09/2014  . Vaginal itching 10/27/2014    Family History  Problem Relation Age of Onset  . Cancer Mother        breast  . Hypertension Father   . Heart attack Father   . Alzheimer's disease Father   . Fibromyalgia Sister   . Diabetes Sister   . Heart attack Sister   . Hypertension Sister   . Hypertension Brother   . Diabetes Brother   . Cancer Maternal Grandmother     Past Surgical History:  Procedure Laterality Date  . BREAST ENHANCEMENT SURGERY     Social History   Occupational History  . Not on file.   Social History Main Topics  . Smoking status: Never Smoker  . Smokeless tobacco: Never Used  . Alcohol use No  . Drug use: No  . Sexual activity: Not Currently    Birth control/ protection: Post-menopausal

## 2017-06-04 ENCOUNTER — Emergency Department (HOSPITAL_COMMUNITY): Payer: Self-pay

## 2017-06-04 ENCOUNTER — Emergency Department (HOSPITAL_COMMUNITY)
Admission: EM | Admit: 2017-06-04 | Discharge: 2017-06-04 | Disposition: A | Discharge status: Home / Self Care | Payer: Self-pay | Attending: Emergency Medicine | Admitting: Emergency Medicine

## 2017-06-04 ENCOUNTER — Encounter (HOSPITAL_COMMUNITY): Payer: Self-pay | Admitting: Emergency Medicine

## 2017-06-04 DIAGNOSIS — Y929 Unspecified place or not applicable: Secondary | ICD-10-CM | POA: Insufficient documentation

## 2017-06-04 DIAGNOSIS — Y939 Activity, unspecified: Secondary | ICD-10-CM | POA: Insufficient documentation

## 2017-06-04 DIAGNOSIS — S92414A Nondisplaced fracture of proximal phalanx of right great toe, initial encounter for closed fracture: Secondary | ICD-10-CM | POA: Insufficient documentation

## 2017-06-04 DIAGNOSIS — W208XXA Other cause of strike by thrown, projected or falling object, initial encounter: Secondary | ICD-10-CM | POA: Insufficient documentation

## 2017-06-04 DIAGNOSIS — Z79899 Other long term (current) drug therapy: Secondary | ICD-10-CM | POA: Insufficient documentation

## 2017-06-04 DIAGNOSIS — Y998 Other external cause status: Secondary | ICD-10-CM | POA: Insufficient documentation

## 2017-06-04 NOTE — ED Triage Notes (Signed)
Patient reports that she was at [employer] and when she went to put something back the shelf fel landing on her right foot.  Patient has swelling to right big toe.

## 2017-06-04 NOTE — ED Provider Notes (Signed)
[facility] DEPT Provider Note   CSN: [id number] Arrival date & time: 06/04/17  1613     History   Chief Complaint Chief Complaint  Patient presents with  . Foot Injury    right    HPI Melissa Mcgrath is a 56 y.o. female with no significant past medical history presents to ED for evaluation of acute onset, constant, throbbing pain to right big toe that began after a shelf fell directly on her toe. She was wearing leather cowboy boots. Associated symptoms include bruising and swelling. She denies numbness or tingling distally. No previous injury or fracture to this toe. Pain is worse with palpation and weightbearing. Slightly alleviated with rest. No interventions PTA.  HPI  Past Medical History:  Diagnosis Date  . BV (bacterial vaginosis) 10/27/2014  . Depression 01/05/2014  . Menopause 01/05/2014  . Vaginal atrophy 11/09/2014  . Vaginal itching 10/27/2014    Patient Active Problem List   Diagnosis Date Noted  . Vaginal atrophy 11/09/2014  . Vaginal itching 10/27/2014  . BV (bacterial vaginosis) 10/27/2014  . Depression 01/05/2014  . Menopause 01/05/2014    Past Surgical History:  Procedure Laterality Date  . BREAST ENHANCEMENT SURGERY      OB History    Gravida Para Term Preterm AB Living   4 2     2 2    SAB TAB Ectopic Multiple Live Births   2               Home Medications    Prior to Admission medications   Medication Sig Start Date End Date Taking? Authorizing Provider  Multiple Vitamin (MULTIVITAMIN) tablet Take 1 tablet by mouth daily.    [provider]    Family History Family History  Problem Relation Age of Onset  . Cancer Mother        breast  . Hypertension Father   . Heart attack Father   . Alzheimer's disease Father   . Fibromyalgia Sister   . Diabetes Sister   . Heart attack Sister   . Hypertension Sister   . Hypertension Brother   . Diabetes Brother   . Cancer Maternal Grandmother      Social History Social History   Tobacco Use  . Smoking status: Never Smoker  . Smokeless tobacco: Never Used  Substance Use Topics  . Alcohol use: No  . Drug use: No     Allergies   Patient has no known allergies.   Review of Systems Review of Systems  Musculoskeletal: Positive for arthralgias.  Skin: Positive for color change.  All other systems reviewed and are negative.    Physical Exam Updated Vital Signs BP (!) 115/58 (BP Location: Left Arm)   Pulse 72   Temp 98.2 F (36.8 C) (Oral)   Resp 17   Ht 5\' 8"  (1.727 m)   Wt 63.5 kg (140 lb)   SpO2 97%   BMI 21.29 kg/m   Physical Exam  Constitutional: She is oriented to person, place, and time. She appears well-developed and well-nourished. No distress.  NAD.  HENT:  Head: Normocephalic and atraumatic.  Right Ear: External ear normal.  Left Ear: External ear normal.  Nose: Nose normal.  Eyes: Conjunctivae and EOM are normal. No scleral icterus.  Neck: Normal range of motion.  Cardiovascular: Normal rate.  No murmur heard. 2+ DP and PT pulses bilaterally  Pulmonary/Chest: Effort normal.  Musculoskeletal: Normal range of motion. She exhibits edema and tenderness. She exhibits no  deformity.  Focal area of edema, tenderness and ecchymosis over the right proximal phalanx of the great toe. Patient able to wiggle her toes with some pain. No signs of tendon injury, she has full strength with toe flexion and extension against resistance. No focal tenderness to MTP or metatarsals on this foot.  Neurological: She is alert and oriented to person, place, and time.  Sensation to distal toes intact bilaterally  Skin: Skin is warm and dry. Capillary refill takes less than 2 seconds.  Psychiatric: She has a normal mood and affect. Her behavior is normal. Judgment and thought content normal.  Nursing note and vitals reviewed.    ED Treatments / Results  Labs (all labs ordered are listed, but only abnormal results are  displayed) Labs Reviewed - No data to display  EKG  EKG Interpretation None       Radiology Dg Foot Complete Right  Result Date: 06/04/2017 CLINICAL DATA:  Crush injury today.  Pain in the great toe. EXAM: RIGHT FOOT COMPLETE - 3+ VIEW COMPARISON:  None FINDINGS: Transverse fracture of the distal proximal phalanx of the great toe. No angulation or displacement. No involvement of the proximal or distal articular surfaces. IMPRESSION: Transverse fracture of the proximal phalanx of the great toe without angulation or displacement. Electronically Signed   By: Nelson Chimes M.D.   On: 06/04/2017 18:26    Procedures Procedures (including critical care time)  Medications Ordered in ED Medications - No data to display   Initial Impression / Assessment and Plan / ED Course  I have reviewed the triage vital signs and the nursing notes.  Pertinent labs & imaging results that were available during my care of the patient were reviewed by me and considered in my medical decision making (see chart for details).  Clinical Course as of Jun 04 2052  Thu Jun 04, 2017  2013 IMPRESSION: Transverse fracture of the proximal phalanx of the great toe without angulation or displacement. DG Foot Complete Right [CG]    Clinical Course User Index [CG] Kinnie Feil, PA-C   56 year old with right great toe fracture to proximal phalanx. No angulation or displacement. Extremities neurovascularly intact. We will buddy tape toe and placed in postop shoe. Plan is to discharge with high-dose NSAIDs, ice, rest and follow-up with podiatry in the next 2-3 weeks for reevaluation to ensure appropriate healing without complications. Patient verbalized understanding and is agreeable with plan. Final Clinical Impressions(s) / ED Diagnoses   Final diagnoses:  Closed nondisplaced fracture of proximal phalanx of right great toe, initial encounter    ED Discharge Orders    None       Arlean Hopping 06/04/17 2053    Tanna Furry, MD 06/12/17 0001

## 2017-06-04 NOTE — Discharge Instructions (Signed)
You have a nondisplaced transverse fracture of your proximal phalanx of your right great toe. Wear your toes taped and your postop shoe for at least 2-4 weeks and/or until range of motion, walking and palpation of your toe causes no pain. An uncomplicated toe fracture typically heals within 2-4 weeks. You need to follow up with podiatry in 2-4 weeks if your pain is not improving. For pain you can take 1000 mg of Tylenol +600 mg of ibuprofen every 6-8 hours. Ice. Elevate.

## 2018-06-22 ENCOUNTER — Ambulatory Visit: Payer: Self-pay | Admitting: Family Medicine

## 2018-08-02 ENCOUNTER — Encounter: Payer: Self-pay | Admitting: Family Medicine

## 2018-09-08 ENCOUNTER — Encounter: Payer: Self-pay | Admitting: Family Medicine

## 2018-09-09 ENCOUNTER — Other Ambulatory Visit: Payer: Self-pay | Admitting: *Deleted

## 2018-09-09 MED ORDER — ETODOLAC 400 MG PO TABS: 400.0000 mg | ORAL_TABLET | Freq: Two times a day (BID) | ORAL | 0 refills | Status: DC

## 2018-09-09 MED ORDER — TIZANIDINE HCL 4 MG PO TABS: 4.0000 mg | ORAL_TABLET | Freq: Three times a day (TID) | ORAL | 0 refills | Status: DC | PRN

## 2018-09-09 NOTE — Telephone Encounter (Signed)
Discussed with pt. Pt verbalized understanding. meds sent to pharm.  

## 2018-09-27 ENCOUNTER — Other Ambulatory Visit: Payer: Self-pay | Admitting: Adult Health

## 2018-10-13 ENCOUNTER — Encounter: Payer: Self-pay | Admitting: Family Medicine

## 2018-10-13 MED ORDER — GENTAMICIN SULFATE 0.3 % OP SOLN: 2.0000 [drp] | Freq: Four times a day (QID) | OPHTHALMIC | 0 refills | Status: DC

## 2019-04-20 ENCOUNTER — Other Ambulatory Visit: Payer: Self-pay

## 2019-06-20 ENCOUNTER — Encounter: Payer: Self-pay | Admitting: Family Medicine

## 2019-06-21 NOTE — Telephone Encounter (Signed)
Melissa Kirschner, MD  You 21 minutes ago (9:13 AM)   Would be a good idea to connect with a dermatologist, but not an emergency. I recall pt has seend derms n thr past. If she needs [place] to do ref to new one we can

## 2019-07-27 ENCOUNTER — Encounter: Payer: Self-pay | Admitting: Family Medicine

## 2019-10-06 ENCOUNTER — Other Ambulatory Visit: Payer: Self-pay | Admitting: Family Medicine

## 2019-10-06 ENCOUNTER — Encounter: Payer: Self-pay | Admitting: Family Medicine

## 2019-10-06 NOTE — Telephone Encounter (Signed)
Pt is hoping Dr. Nicki Reaper would weigh in since Dr. Richardson Landry is not here today. She has been doing warm compress on her left eye but it is not helping much and almost swollen shut

## 2019-10-07 ENCOUNTER — Telehealth: Payer: Self-pay | Admitting: Family Medicine

## 2019-10-07 MED ORDER — CEPHALEXIN 500 MG PO CAPS: 500.0000 mg | ORAL_CAPSULE | Freq: Four times a day (QID) | ORAL | 0 refills | Status: DC

## 2019-10-07 NOTE — Telephone Encounter (Signed)
Given the history of Melissa Mcgrath swollen shut in addition to the eyedrops I would recommend Keflex 500 mg 4 times daily for 5 days along with warm compresses if not doing better by early next week recheck if worse over the weekend or any urgent issues go to urgent care

## 2019-10-07 NOTE — Addendum Note (Signed)
Addended by: Dairl Ponder on: 10/07/2019 05:02 PM   Modules accepted: Orders

## 2019-10-07 NOTE — Telephone Encounter (Signed)
Prescription sent electronically to pharmacy. Patient notified and stated her eye is doing much better with the drops and warm compresses and she does not feel she will need to pick up the oral antibiotic.

## 2019-11-23 ENCOUNTER — Other Ambulatory Visit: Payer: Self-pay

## 2019-11-23 ENCOUNTER — Encounter (INDEPENDENT_AMBULATORY_CARE_PROVIDER_SITE_OTHER): Payer: Self-pay | Admitting: Internal Medicine

## 2019-11-23 ENCOUNTER — Ambulatory Visit (INDEPENDENT_AMBULATORY_CARE_PROVIDER_SITE_OTHER): Payer: Self-pay | Admitting: Internal Medicine

## 2019-11-23 VITALS — BP 110/70 | HR 68 | Temp 98.1°F | Ht 66.0 in | Wt 144.4 lb

## 2019-11-23 DIAGNOSIS — F520 Hypoactive sexual desire disorder: Secondary | ICD-10-CM

## 2019-11-23 DIAGNOSIS — R6882 Decreased libido: Secondary | ICD-10-CM

## 2019-11-23 NOTE — Progress Notes (Signed)
Metrics: Intervention Frequency ACO  Documented Smoking Status Yearly  Screened one or more times in 24 months  Cessation Counseling or  Active cessation medication Past 24 months  Past 24 months   Guideline developer: UpToDate (See UpToDate for funding source) Date Released: 2014       Wellness Office Visit  Subjective:  Patient ID: Melissa Mcgrath, female    DOB: 1960-07-30  Age: 59 y.o. MRN: [id number]  CC: This delightful 59 year old lady comes to our practice to establish care.  Her physician that she has been going to for years has retired. HPI  She is on no medication.  She tells me that for the last year or so, she is finding that she has very little energy. On closer questioning, she also describes virtually no libido. Her husband, who is about 59 years her senior and has had prostate cancer, also has had difficulty with sexual function. Past Medical History:  Diagnosis Date  . BV (bacterial vaginosis) 10/27/2014  . Depression 01/05/2014  . Menopause 01/05/2014  . Vaginal atrophy 11/09/2014  . Vaginal itching 10/27/2014   Past Surgical History:  Procedure Laterality Date  . BREAST ENHANCEMENT SURGERY       Family History  Problem Relation Age of Onset  . Cancer Mother        breast  . Hypertension Father   . Heart attack Father   . Alzheimer's disease Father   . Fibromyalgia Sister   . Diabetes Sister   . Heart attack Sister   . Hypertension Sister   . Hypertension Brother   . Diabetes Brother   . 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.   Social History   Tobacco Use  . Smoking status: Never Smoker  . Smokeless tobacco: Never Used  Substance Use Topics  . Alcohol use: No    Current Meds  Medication Sig  . Multiple Vitamin (MULTIVITAMIN) tablet Take 1 tablet by mouth daily.       No flowsheet data found.   Objective:   Today's Vitals:  BP 110/70 (BP Location: Right Arm, Patient Position: Sitting, Cuff Size: Normal)   Pulse 68   Temp 98.1 F (36.7 C) (Temporal)   Ht 5\' 6"  (1.676 m)   Wt 144 lb 6.4 oz (65.5 kg)   SpO2 99%   BMI 23.31 kg/m  Vitals with BMI 11/23/2019 06/04/2017 06/04/2017  Height 5\' 6"  - 5\' 8"   Weight 144 lbs 6 oz - 140 lbs  BMI 123XX123 - 99991111  Systolic [id number] 123XX123 -  Diastolic 70 69 -  Pulse 68 70 -     Physical Exam She looks healthy.  Blood pressure excellent.      Assessment   1. Decreased libido   2. Hypoactive sexual desire disorder       Tests ordered Orders Placed This Encounter  Procedures  . Testos,Total,Free and SHBG (Female)  . Progesterone  . Estradiol     Plan: 1. We discussed the possibility of hormonal deficiencies causing her symptoms and we will check the levels , especially testosterone. 2. I will see her in about 3 weeks to discuss the results and further recommendations. 3. Today I spent 45 minutes with the patient discussing COVID-19 vaccination (she was hesitant about this) as well as her overall health and my philosophy of practice.   No orders of the defined types were placed in this encounter.  Doree Albee, MD

## 2019-12-04 LAB — TESTOS,TOTAL,FREE AND SHBG (FEMALE)
Free Testosterone: 2.2 pg/mL (ref 0.1–6.4)
Sex Hormone Binding: 47 nmol/L (ref 14–73)
Testosterone, Total, LC-MS-MS: 19 ng/dL (ref 2–45)

## 2019-12-04 LAB — PROGESTERONE: Progesterone: <0.5 ng/mL

## 2019-12-04 LAB — ESTRADIOL: Estradiol: <15 pg/mL

## 2019-12-14 ENCOUNTER — Other Ambulatory Visit: Payer: Self-pay

## 2019-12-14 ENCOUNTER — Encounter (INDEPENDENT_AMBULATORY_CARE_PROVIDER_SITE_OTHER): Payer: Self-pay | Admitting: Internal Medicine

## 2019-12-14 ENCOUNTER — Ambulatory Visit (INDEPENDENT_AMBULATORY_CARE_PROVIDER_SITE_OTHER): Payer: Self-pay | Admitting: Internal Medicine

## 2019-12-14 VITALS — BP 115/70 | HR 86 | Temp 97.3°F | Ht 66.0 in | Wt 142.6 lb

## 2019-12-14 DIAGNOSIS — F520 Hypoactive sexual desire disorder: Secondary | ICD-10-CM

## 2019-12-14 DIAGNOSIS — R6882 Decreased libido: Secondary | ICD-10-CM

## 2019-12-14 DIAGNOSIS — Z124 Encounter for screening for malignant neoplasm of cervix: Secondary | ICD-10-CM

## 2019-12-14 MED ORDER — FIRST-TESTOSTERONE MC 2 % TD CREA: 5.0000 mg | TOPICAL_CREAM | Freq: Every day | TRANSDERMAL | 0 refills | Status: DC

## 2019-12-14 NOTE — Addendum Note (Signed)
Addended by: Doree Albee on: 12/14/2019 06:29 PM   Modules accepted: Orders

## 2019-12-14 NOTE — Progress Notes (Addendum)
Metrics: Intervention Frequency ACO  Documented Smoking Status Yearly  Screened one or more times in 24 months  Cessation Counseling or  Active cessation medication Past 24 months  Past 24 months   Guideline developer: UpToDate (See UpToDate for funding source) Date Released: 2014       Wellness Office Visit  Subjective:  Patient ID: Melissa Mcgrath, female    DOB: 07-25-1960  Age: 59 y.o. MRN: [id number]  CC: This lady comes in for follow-up regarding her blood work and further recommendations. HPI  She complains of decreased libido and vagina dryness.  I reviewed her blood work with her which shows nonexistent estradiol and progesterone levels and suboptimal testosterone levels. Past Medical History:  Diagnosis Date  . BV (bacterial vaginosis) 10/27/2014  . Depression 01/05/2014  . Menopause 01/05/2014  . Vaginal atrophy 11/09/2014  . Vaginal itching 10/27/2014   Past Surgical History:  Procedure Laterality Date  . BREAST ENHANCEMENT SURGERY       Family History  Problem Relation Age of Onset  . Cancer Mother        breast  . Hypertension Father   . Heart attack Father   . Alzheimer's disease Father   . Fibromyalgia Sister   . Diabetes Sister   . Heart attack Sister   . Hypertension Sister   . Hypertension Brother   . Diabetes Brother   . 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.   Social History   Tobacco Use  . Smoking status: Never Smoker  . Smokeless tobacco: Never Used  Substance Use Topics  . Alcohol use: No    Current Meds  Medication Sig  . Multiple Vitamin (MULTIVITAMIN) tablet Take 1 tablet by mouth daily.       No flowsheet data found.   Objective:   Today's Vitals: BP 115/70 (BP Location: Left Arm, Patient Position: Sitting, Cuff Size: Normal)   Pulse 86   Temp (!) 97.3 F (36.3 C) (Temporal)   Ht 5\' 6"  (1.676 m)   Wt 142 lb 9.6  oz (64.7 kg)   SpO2 98%   BMI 23.02 kg/m  Vitals with BMI 12/14/2019 11/23/2019 06/04/2017  Height 5\' 6"  5\' 6"  -  Weight 142 lbs 10 oz 144 lbs 6 oz -  BMI 82.99 37.16 -  Systolic 967 893 810  Diastolic 70 70 69  Pulse 86 68 70     Physical Exam   She looks systemically well.  No new physical findings.    Assessment   1. Decreased libido   2. Hypoactive sexual desire disorder   3. Cervical cancer screening       Tests ordered Orders Placed This Encounter  Procedures  . Ambulatory referral to Gynecology     Plan: 1. I discussed all the results in detail. 2. After long discussion and FDA warnings, side effects and benefits of testosterone, she is electing to start testosterone cream and I am going to call this into [place].  She will take testosterone cream 5 mg apply to the labia every day.  I discussed with her side effects and how to apply it and what she should expect. 3. I will see her in about 1 month's time to see how she is doing and we will check levels then. 4. Today I spent 30 minutes with this patient discussing testosterone therapy and its benefits and side effects.  Meds ordered this encounter  Medications  . Testosterone Propionate (FIRST-TESTOSTERONE MC) 2 % CREA    Sig: Place 5 mg onto the skin daily.    Dispense:  30 g    Refill:  0    Eland Lamantia Luther Parody, MD

## 2019-12-14 NOTE — Addendum Note (Signed)
Addended by: Doree Albee on: 12/14/2019 06:25 PM   Modules accepted: Orders

## 2020-01-12 ENCOUNTER — Ambulatory Visit (INDEPENDENT_AMBULATORY_CARE_PROVIDER_SITE_OTHER): Payer: Self-pay | Admitting: Internal Medicine

## 2020-01-12 ENCOUNTER — Encounter (INDEPENDENT_AMBULATORY_CARE_PROVIDER_SITE_OTHER): Payer: Self-pay | Admitting: Internal Medicine

## 2020-01-12 ENCOUNTER — Other Ambulatory Visit: Payer: Self-pay

## 2020-01-12 VITALS — BP 100/60 | HR 77 | Temp 97.5°F | Ht 66.0 in | Wt 141.6 lb

## 2020-01-12 DIAGNOSIS — F520 Hypoactive sexual desire disorder: Secondary | ICD-10-CM

## 2020-01-12 MED ORDER — ESTRADIOL 0.5 MG PO TABS: 0.5000 mg | ORAL_TABLET | Freq: Every day | ORAL | 3 refills | Status: DC

## 2020-01-12 MED ORDER — PROGESTERONE MICRONIZED 100 MG PO CAPS: 100.0000 mg | ORAL_CAPSULE | Freq: Every day | ORAL | 3 refills | Status: DC

## 2020-01-12 NOTE — Progress Notes (Signed)
Metrics: Intervention Frequency ACO  Documented Smoking Status Yearly  Screened one or more times in 24 months  Cessation Counseling or  Active cessation medication Past 24 months  Past 24 months   Guideline developer: UpToDate (See UpToDate for funding source) Date Released: 2014       Wellness Office Visit  Subjective:  Patient ID: Melissa Mcgrath, female    DOB: 1961/03/10  Age: 59 y.o. MRN: [id number]  CC: This lady comes in for follow-up of testosterone therapy. HPI  She feels that her libido is improved, vagina dryness is also improved.  Her energy is also improved.  However, she would like more energy and libido.  She has not experienced any side effects from testosterone therapy. Past Medical History:  Diagnosis Date  . BV (bacterial vaginosis) 10/27/2014  . Depression 01/05/2014  . Menopause 01/05/2014  . Vaginal atrophy 11/09/2014  . Vaginal itching 10/27/2014   Past Surgical History:  Procedure Laterality Date  . BREAST ENHANCEMENT SURGERY       Family History  Problem Relation Age of Onset  . Cancer Mother        breast  . Hypertension Father   . Heart attack Father   . Alzheimer's disease Father   . Fibromyalgia Sister   . Diabetes Sister   . Heart attack Sister   . Hypertension Sister   . Hypertension Brother   . Diabetes Brother   . 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.   Social History   Tobacco Use  . Smoking status: Never Smoker  . Smokeless tobacco: Never Used  Substance Use Topics  . Alcohol use: No    Current Meds  Medication Sig  . Multiple Vitamin (MULTIVITAMIN) tablet Take 1 tablet by mouth daily.  . Testosterone Propionate (FIRST-TESTOSTERONE MC) 2 % CREA Place 5 mg onto the skin daily.      No flowsheet data found.   Objective:   Today's Vitals: BP (!) 100/60 (BP Location: Left Arm, Patient Position: Sitting, Cuff  Size: Normal)   Pulse 77   Temp (!) 97.5 F (36.4 C) (Temporal)   Ht 5\' 6"  (1.676 m)   Wt 141 lb 9.6 oz (64.2 kg)   SpO2 97%   BMI 22.85 kg/m  Vitals with BMI 01/12/2020 12/14/2019 11/23/2019  Height 5\' 6"  5\' 6"  5\' 6"   Weight 141 lbs 10 oz 142 lbs 10 oz 144 lbs 6 oz  BMI 22.87 78.29 56.21  Systolic [phone]  Diastolic 60 70 70  Pulse 77 86 68     Physical Exam   She looks systemically well.  Weight is stable.  Blood pressure is excellent.    Assessment   1. Hypoactive sexual desire disorder       Tests ordered Orders Placed This Encounter  Procedures  . Testos,Total,Free and SHBG (Female)     Plan: 1. I will check testosterone levels today to see if we need to adjust the dose further, I suspect we will. 2. I also want to start her on the other bioidentical hormones with estradiol and progesterone.  We had a discussion of this before and she is agreeable to starting them now.  I have sent prescriptions of this to her pharmacy. 3. Follow-up in 2 months and see how she is doing then.   Meds ordered this encounter  Medications  . estradiol (ESTRACE) 0.5 MG tablet  Sig: Take 1 tablet (0.5 mg total) by mouth daily.    Dispense:  30 tablet    Refill:  3  . progesterone (PROMETRIUM) 100 MG capsule    Sig: Take 1 capsule (100 mg total) by mouth daily.    Dispense:  30 capsule    Refill:  3    Donja Tipping Luther Parody, MD

## 2020-01-17 LAB — TESTOS,TOTAL,FREE AND SHBG (FEMALE)
Free Testosterone: 6.5 pg/mL — ABNORMAL HIGH (ref 0.1–6.4)
Sex Hormone Binding: 53 nmol/L (ref 14–73)
Testosterone, Total, LC-MS-MS: 60 ng/dL — ABNORMAL HIGH (ref 2–45)

## 2020-01-26 ENCOUNTER — Encounter: Payer: Self-pay | Admitting: Internal Medicine

## 2020-03-26 ENCOUNTER — Encounter (INDEPENDENT_AMBULATORY_CARE_PROVIDER_SITE_OTHER): Payer: Self-pay | Admitting: Internal Medicine

## 2020-03-26 ENCOUNTER — Other Ambulatory Visit: Payer: Self-pay

## 2020-03-26 ENCOUNTER — Ambulatory Visit (INDEPENDENT_AMBULATORY_CARE_PROVIDER_SITE_OTHER): Payer: Self-pay | Admitting: Internal Medicine

## 2020-03-26 VITALS — BP 130/68 | HR 77 | Temp 97.3°F | Resp 18 | Ht 68.0 in | Wt 140.6 lb

## 2020-03-26 DIAGNOSIS — R6882 Decreased libido: Secondary | ICD-10-CM

## 2020-03-26 DIAGNOSIS — F520 Hypoactive sexual desire disorder: Secondary | ICD-10-CM

## 2020-03-26 MED ORDER — PROGESTERONE 200 MG PO CAPS: 200.0000 mg | ORAL_CAPSULE | Freq: Every day | ORAL | 1 refills | Status: DC

## 2020-03-26 MED ORDER — ESTRADIOL 1 MG PO TABS: 1.0000 mg | ORAL_TABLET | Freq: Every day | ORAL | 1 refills | Status: DC

## 2020-03-26 NOTE — Progress Notes (Signed)
Metrics: Intervention Frequency ACO  Documented Smoking Status Yearly  Screened one or more times in 24 months  Cessation Counseling or  Active cessation medication Past 24 months  Past 24 months   Guideline developer: UpToDate (See UpToDate for funding source) Date Released: 2014       Wellness Office Visit  Subjective:  Patient ID: Melissa Mcgrath, female    DOB: Sep 17, 1960  Age: 59 y.o. MRN: [id number]  CC: This lady comes in for follow-up regarding her menopausal symptoms, testosterone therapy. HPI  She has tolerated estradiol and progesterone and now she sleeps much better when she is ever done.  Unfortunately, she discontinued testosterone cream because she was getting breakout in the labial area. Since stopping testosterone therapy which was a couple of months ago, she still has some vagina moistness maintained using estradiol and progesterone but not as well as she would like. Past Medical History:  Diagnosis Date  . BV (bacterial vaginosis) 10/27/2014  . Depression 01/05/2014  . Menopause 01/05/2014  . Vaginal atrophy 11/09/2014  . Vaginal itching 10/27/2014   Past Surgical History:  Procedure Laterality Date  . BREAST ENHANCEMENT SURGERY       Family History  Problem Relation Age of Onset  . Cancer Mother        breast  . Hypertension Father   . Heart attack Father   . Alzheimer's disease Father   . Fibromyalgia Sister   . Diabetes Sister   . Heart attack Sister   . Hypertension Sister   . Hypertension Brother   . Diabetes Brother   . 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.   Social History   Tobacco Use  . Smoking status: Never Smoker  . Smokeless tobacco: Never Used  Substance Use Topics  . Alcohol use: No    Current Meds  Medication Sig  . estradiol (ESTRACE) 0.5 MG tablet Take 1 tablet (0.5 mg total) by mouth daily.  . Multiple Vitamin  (MULTIVITAMIN) tablet Take 1 tablet by mouth daily.  . progesterone (PROMETRIUM) 100 MG capsule Take 1 capsule (100 mg total) by mouth daily.  . Testosterone Propionate (FIRST-TESTOSTERONE MC) 2 % CREA Place 5 mg onto the skin daily.      Depression screen PHQ 2/9 03/26/2020  Decreased Interest 0  Down, Depressed, Hopeless 1  PHQ - 2 Score 1     Objective:   Today's Vitals: BP 130/68 (BP Location: Right Arm, Patient Position: Sitting, Cuff Size: Normal)   Pulse 77   Temp (!) 97.3 F (36.3 C) (Temporal)   Resp 18   Ht 5\' 8"  (1.727 m)   Wt 140 lb 9.6 oz (63.8 kg)   SpO2 97%   BMI 21.38 kg/m  Vitals with BMI 03/26/2020 01/12/2020 12/14/2019  Height 5\' 8"  5\' 6"  5\' 6"   Weight 140 lbs 10 oz 141 lbs 10 oz 142 lbs 10 oz  BMI 21.38 23.55 73.22  Systolic 025 427 062  Diastolic 68 60 70  Pulse 77 77 86     Physical Exam   She looks systemically well.  No new physical findings.    Assessment   1. Decreased libido   2. Hypoactive sexual desire disorder       Tests ordered No orders of the defined types were placed in this encounter.    Plan: 1. We decided to increase the estradiol and progesterone doses so that  she will be taking estradiol 1 mg in the morning and progesterone 200 mg at night. 2. Later on, we can discuss testosterone injections which is another good option. 3. Follow-up in 2 months.   Meds ordered this encounter  Medications  . estradiol (ESTRACE) 1 MG tablet    Sig: Take 1 tablet (1 mg total) by mouth daily.    Dispense:  90 tablet    Refill:  1  . progesterone (PROMETRIUM) 200 MG capsule    Sig: Take 1 capsule (200 mg total) by mouth daily.    Dispense:  90 capsule    Refill:  1    Jariel Drost Luther Parody, MD

## 2020-06-04 ENCOUNTER — Other Ambulatory Visit: Payer: Self-pay

## 2020-06-04 ENCOUNTER — Encounter (INDEPENDENT_AMBULATORY_CARE_PROVIDER_SITE_OTHER): Payer: Self-pay | Admitting: Internal Medicine

## 2020-06-04 ENCOUNTER — Ambulatory Visit (INDEPENDENT_AMBULATORY_CARE_PROVIDER_SITE_OTHER): Payer: Self-pay | Admitting: Internal Medicine

## 2020-06-04 VITALS — BP 108/66 | HR 69 | Temp 97.5°F | Ht 68.0 in | Wt 142.8 lb

## 2020-06-04 DIAGNOSIS — N951 Menopausal and female climacteric states: Secondary | ICD-10-CM

## 2020-06-04 DIAGNOSIS — F520 Hypoactive sexual desire disorder: Secondary | ICD-10-CM

## 2020-06-04 MED ORDER — NONFORMULARY OR COMPOUNDED ITEM: 5.0000 mg | 3 refills | Status: DC

## 2020-06-04 MED ORDER — TESTOSTERONE 1.62 % TD GEL: 5.0000 mg | Freq: Every day | TRANSDERMAL | 1 refills | Status: DC

## 2020-06-04 NOTE — Progress Notes (Signed)
Metrics: Intervention Frequency ACO  Documented Smoking Status Yearly  Screened one or more times in 24 months  Cessation Counseling or  Active cessation medication Past 24 months  Past 24 months   Guideline developer: UpToDate (See UpToDate for funding source) Date Released: 2014       Wellness Office Visit  Subjective:  Patient ID: Melissa Mcgrath, female    DOB: 07/27/1960  Age: 59 y.o. MRN: [id number]  CC: This lady comes in for follow-up of bioidentical hormone therapy. HPI  She tolerated the higher dose of estradiol and progesterone and she sleeps extremely well now with the progesterone especially.  There are no side effects from taking estradiol at a higher dose of 1 mg daily. She did not tolerate testosterone cream as she had a breakout.  She is willing to try testosterone injections.  She does describe anxiety around her husband who seems to rely on her to pick up after her.  This is causing a lot of stress and resentment.  They have been married for 40 years.  She is planning to discuss all this with him. She denies any vagina dryness now, the symptoms of completely resolved. Past Medical History:  Diagnosis Date  . BV (bacterial vaginosis) 10/27/2014  . Depression 01/05/2014  . Menopause 01/05/2014  . Vaginal atrophy 11/09/2014  . Vaginal itching 10/27/2014   Past Surgical History:  Procedure Laterality Date  . BREAST ENHANCEMENT SURGERY       Family History  Problem Relation Age of Onset  . Cancer Mother        breast  . Hypertension Father   . Heart attack Father   . Alzheimer's disease Father   . Fibromyalgia Sister   . Diabetes Sister   . Heart attack Sister   . Hypertension Sister   . Hypertension Brother   . Diabetes Brother   . 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.   Social History   Tobacco Use  . Smoking status: Never Smoker  . Smokeless  tobacco: Never Used  Substance Use Topics  . Alcohol use: No    Current Meds  Medication Sig  . estradiol (ESTRACE) 1 MG tablet Take 1 tablet (1 mg total) by mouth daily.  . Multiple Vitamin (MULTIVITAMIN) tablet Take 1 tablet by mouth daily.  . progesterone (PROMETRIUM) 200 MG capsule Take 1 capsule (200 mg total) by mouth daily.      Depression screen [facility] 2/9 06/04/2020 03/26/2020  Decreased Interest 0 0  Down, Depressed, Hopeless 0 1  PHQ - 2 Score 0 1  Altered sleeping 0 -  Tired, decreased energy 0 -  Change in appetite 0 -  Feeling bad or failure about yourself  0 -  Trouble concentrating 0 -  Moving slowly or fidgety/restless 0 -  Suicidal thoughts 0 -  PHQ-9 Score 0 -  Difficult doing work/chores Not difficult at all -     Objective:   Today's Vitals: BP 108/66   Pulse 69   Temp (!) 97.5 F (36.4 C) (Temporal)   Ht 5\' 8"  (1.727 m)   Wt 142 lb 12.8 oz (64.8 kg)   SpO2 97%   BMI 21.71 kg/m  Vitals with BMI 06/04/2020 03/26/2020 01/12/2020  Height 5\' 8"  5\' 8"  5\' 6"   Weight 142 lbs 13 oz 140 lbs 10 oz 141 lbs 10 oz  BMI 21.72 95.62 13.08  Systolic  370 488 891  Diastolic 66 68 60  Pulse 69 77 77     Physical Exam   She looks systemically well.   Assessment   1. Hypoactive sexual desire disorder   2. Vaginal dryness, menopausal       Tests ordered No orders of the defined types were placed in this encounter.    Plan: 1. She will continue with estradiol and progesterone at the current doses. 2. We discussed testosterone injections and she is willing to and agreeable to start these based on the discussion we had previously on the benefits and side effects and long-term effects in terms of prevention of disease.  She will start taking testosterone 5 mg intramuscular once a week. 3. Follow-up in 2 months to see how she is doing and then at that time we will draw blood work.  I spent 30 minutes with this patient discussing her anxiety symptoms as well  as testosterone therapy.   Meds ordered this encounter  Medications  . Testosterone 1.62 % GEL    Sig: Place 5 mg onto the skin daily.    Dispense:  30 g    Refill:  1  . NONFORMULARY OR COMPOUNDED ITEM    Sig: Inject 5 mg into the muscle every 7 (seven) days. Testosterone cypionate in olive  oil (25 mg/mL).  Dispense 5 mL vial.    Dispense:  1 each    Refill:  3    Spyros Winch Luther Parody, MD

## 2020-06-05 ENCOUNTER — Encounter (INDEPENDENT_AMBULATORY_CARE_PROVIDER_SITE_OTHER): Payer: Self-pay | Admitting: Internal Medicine

## 2020-08-08 ENCOUNTER — Ambulatory Visit (INDEPENDENT_AMBULATORY_CARE_PROVIDER_SITE_OTHER): Payer: Self-pay | Admitting: Internal Medicine

## 2020-08-21 ENCOUNTER — Encounter (INDEPENDENT_AMBULATORY_CARE_PROVIDER_SITE_OTHER): Payer: Self-pay | Admitting: Internal Medicine

## 2020-08-23 ENCOUNTER — Telehealth: Payer: Self-pay | Admitting: Oncology

## 2020-08-23 NOTE — Telephone Encounter (Signed)
Spoke with patient to confirm morning clinic appointment for 3/9, will email packet

## 2020-08-24 ENCOUNTER — Encounter: Payer: Self-pay | Admitting: *Deleted

## 2020-08-24 ENCOUNTER — Encounter: Payer: Self-pay | Admitting: Obstetrics and Gynecology

## 2020-08-27 NOTE — Progress Notes (Signed)
Radiation Oncology         [phone] ________________________________  Multidisciplinary Breast Oncology Clinic [facility]) Initial Outpatient Consultation  Name: Melissa Mcgrath MRN: [id number]  Date: 08/29/2020  DOB: [birth year]  [id number], Melissa Johns, MD  Stark Klein, MD   REFERRING PHYSICIAN: Stark Klein, MD  DIAGNOSIS: The encounter diagnosis was Malignant neoplasm of upper-outer quadrant of right breast in female, estrogen receptor positive ([place]).  Stage II-A/B Right Breast UOQ, invasive ductal carcinoma carcinoma, ER+ / PR+ / Her2-, Grade 3    ICD-10-CM   1. Malignant neoplasm of upper-outer quadrant of right breast in female, estrogen receptor positive ([place])  C50.411    Z17.0     HISTORY OF PRESENT ILLNESS::Melissa Mcgrath is a 60 y.o. female who is presenting to the office today for evaluation of her newly diagnosed breast cancer. She is accompanied by her sister Melissa Mcgrath. She is doing well overall.   She underwent bilateral diagnostic mammography with tomography and right breast ultrasonography at [facility] on 08/20/2020 for evaluation of palpable right breast lump since September of 2021. Results showed a 2.1 x 1.6 x 0.8 cm irregular mass in the right breast at the 9 o'clock position that was highly suggestive of malignancy.  Biopsy on 08/21/2020 showed: Invasive ductal carcinoma. Prognostic indicators significant for: estrogen receptor, 90% positive and progesterone receptor, 2% positive. Proliferation marker Ki67 at 40%. HER2 negative.   The biopsied lymph node in the right axillary tail also showed invasive ductal carcinoma. No lymph node tissue was identified, possibly representing n entirely replaced lymph node.   The patient was referred today for presentation in the multidisciplinary conference.  Radiology studies and pathology slides were presented there for review and discussion of treatment options.  A consensus was discussed regarding potential next  steps.  GYNECOLOGIC HISTORY:  No LMP recorded. Patient is postmenopausal. Menarche: 60 years old Age at first live birth: 60 years old [place] P 2 LMP 2010 Contraceptive: used for approx. 20 years HRT used from 11/2019 until diagnosis Hysterectomy? no BSO? no  PREVIOUS RADIATION THERAPY: No  PAST MEDICAL HISTORY:  Past Medical History:  Diagnosis Date  . BV (bacterial vaginosis) 10/27/2014  . Depression 01/05/2014  . Facial basal cell cancer   . Family history of breast cancer   . Family history of melanoma   . Family history of ovarian cancer   . Family history of pancreatic cancer   . Menopause 01/05/2014  . Vaginal atrophy 11/09/2014  . Vaginal itching 10/27/2014    PAST SURGICAL HISTORY: Past Surgical History:  Procedure Laterality Date  . BREAST ENHANCEMENT SURGERY    . CARPAL TUNNEL RELEASE Right   . refractive lensectomy Bilateral     FAMILY HISTORY:  Family History  Problem Relation Age of Onset  . Breast cancer Mother 11       breast  . Hypertension Father   . Heart attack Father   . Alzheimer's disease Father   . Fibromyalgia Sister   . Diabetes Sister   . Heart attack Sister   . Hypertension Sister   . Hypertension Brother   . Diabetes Brother   . Pancreatic cancer Maternal Grandmother 43       Pancreatic  . Melanoma Paternal Uncle        dx 22s  . Melanoma Paternal Uncle        dx 68s  . 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 Husband Melissa Mcgrath "Melissa Mcgrath" is retired from working for [employer]. For exercise, she walks daily, rides horses every other week, and does farm work daily. Daughter Melissa Mcgrath, age 80, is a horse trainer/instructor. Daughter Melissa Mcgrath, age 43, is a Copywriter, advertising in [place]. Melissa Mcgrath has one grandchild. She attends [employer].    Social History   Socioeconomic History  . Marital status: Married    Spouse name: Not on file  . Number of children: Not on file  .  Years of education: Not on file  . Highest education level: Not on file  Occupational History  . Not on file  Tobacco Use  . Smoking status: Never Smoker  . Smokeless tobacco: Never Used  Vaping Use  . Vaping Use: Never used  Substance and Sexual Activity  . Alcohol use: No  . Drug use: No  . Sexual activity: Not Currently    Birth control/protection: Post-menopausal  Other Topics Concern  . Not on file  Social History Narrative   Married 11 years,lives with husband.Own [place] [employer].   Social Determinants of Health   Financial Resource Strain: Low Risk   . Difficulty of Paying Living Expenses: Not very hard  Food Insecurity: No Food Insecurity  . Worried About Charity fundraiser in the Last Year: Never true  . Ran Out of Food in the Last Year: Never true  Transportation Needs: No Transportation Needs  . Lack of Transportation (Medical): No  . Lack of Transportation (Non-Medical): No  Physical Activity: Not on file  Stress: Not on file  Social Connections: Not on file    ALLERGIES: No Known Allergies  MEDICATIONS:  Current Outpatient Medications  Medication Sig Dispense Refill  . dexamethasone (DECADRON) 4 MG tablet Take 2 tablets (8 mg total) by mouth daily. Take daily for 3 days after chemo. Take with food. 30 tablet 1  . estradiol (ESTRACE) 1 MG tablet Take 1 tablet (1 mg total) by mouth daily. 90 tablet 1  . loratadine (CLARITIN) 10 MG tablet Take 1 tablet (10 mg total) by mouth daily. 90 tablet 0  . LORazepam (ATIVAN) 0.5 MG tablet Take 1 tablet (0.5 mg total) by mouth at bedtime as needed (Nausea or vomiting). 20 tablet 0  . Multiple Vitamin (MULTIVITAMIN) tablet Take 1 tablet by mouth daily. (Patient not taking: Reported on 08/29/2020)    . NONFORMULARY OR COMPOUNDED ITEM Inject 5 mg into the muscle every 7 (seven) days. Testosterone cypionate in olive  oil (25 mg/mL).  Dispense 5 mL vial. (Patient not taking: Reported on 08/29/2020) 1 each 3  .  prochlorperazine (COMPAZINE) 10 MG tablet Take 1 tablet (10 mg total) by mouth every 6 (six) hours as needed (Nausea or vomiting). 30 tablet 1  . progesterone (PROMETRIUM) 200 MG capsule Take 1 capsule (200 mg total) by mouth daily. (Patient not taking: Reported on 08/29/2020) 90 capsule 1   No current facility-administered medications for this encounter.    REVIEW OF SYSTEMS: A 10+ POINT REVIEW OF SYSTEMS WAS OBTAINED including neurology, dermatology, psychiatry, cardiac, respiratory, lymph, extremities, GI, GU, musculoskeletal, constitutional, reproductive, HEENT. On the provided questionnaire, Chrisie reports loss of sleep and palpable breast lump. The patient denies unusual headaches, visual changes, nausea, vomiting, stiff neck, dizziness, or gait imbalance. There has been no cough, phlegm production, or pleurisy, no chest pain or pressure, and no change in bowel or bladder habits. The patient denies fever, rash, bleeding, unexplained fatigue or unexplained weight loss. A  detailed review of systems was otherwise entirely negative.  PHYSICAL EXAM:  Vitals:   08/29/20 0845  BP: (!) 116/58  Pulse: 74  Resp: 20  Temp: 97.9 F (36.6 C)  SpO2: 100%     Body mass index is 21.51 kg/m.      Wt Readings from Last 3 Encounters:  08/29/20 139 lb 6.4 oz (63.2 kg)  06/04/20 142 lb 12.8 oz (64.8 kg)    Lungs are clear to auscultation bilaterally. Heart has regular rate and rhythm. No palpable cervical, supraclavicular, or axillary adenopathy. Abdomen soft, non-tender, normal bowel sounds. Left breast with no palpable mass, nipple discharge, or bleeding.  Cosmetic implant in place Right breast with status post recent biopsy.  Mild ecchymosis present.  Palpable mass versus induration from biopsy. cosmetic implant in place.   KPS = 100  100 - Normal; no complaints; no evidence of disease. 90   - Able to carry on normal activity; minor signs or symptoms of disease. 80   - Normal activity with effort;  some signs or symptoms of disease. 40   - Cares for self; unable to carry on normal activity or to do active work. 60   - Requires occasional assistance, but is able to care for most of his personal needs. 50   - Requires considerable assistance and frequent medical care. 33   - Disabled; requires special care and assistance. 4   - Severely disabled; hospital admission is indicated although death not imminent. 41   - Very sick; hospital admission necessary; active supportive treatment necessary. 10   - Moribund; fatal processes progressing rapidly. 0     - Dead  Karnofsky DA, Abelmann Taylor, Craver LS and Burchenal [facility] [phone]) The use of the nitrogen mustards in the palliative treatment of carcinoma: with particular reference to bronchogenic carcinoma Cancer 1 634-56  LABORATORY DATA:  Lab Results  Component Value Date   WBC 4.9 08/29/2020   HGB 12.1 08/29/2020   HCT 36.5 08/29/2020   MCV 94.1 08/29/2020   PLT 260 08/29/2020   Lab Results  Component Value Date   NA 141 08/29/2020   K 3.9 08/29/2020   CL 107 08/29/2020   CO2 26 08/29/2020   Lab Results  Component Value Date   ALT 12 08/29/2020   AST 12 (L) 08/29/2020   ALKPHOS 68 08/29/2020   BILITOT 0.6 08/29/2020    PULMONARY FUNCTION TEST:   Recent Review Flowsheet Data   There is no flowsheet data to display.     RADIOGRAPHY: No results found.    IMPRESSION: Stage II-A/B Right Breast UOQ, invasive ductal carcinoma carcinoma, ER+ / PR+ / Her2-, Grade 3  The patient will be a good candidate for breast conservation with radiotherapy to the right breast. We discussed the general course of radiation, potential side effects, and toxicities with radiation and the patient is interested in this approach.  We also discussed the implications of cosmetic implants and potential for capsular fibrosis.  We also discussed indications for postmastectomy radiation therapy   PLAN:   (1) genetics testing  (2) neoadjuvant chemotherapy  will consist of cyclophosphamide and doxorubicin in dose dense fashion x4 starting 09/18/2020, to be followed by weekly paclitaxel x12  (3) definitive surgery to follow  (4) adjuvant radiation  (5) adjuvant antiestrogens   ------------------------------------------------  Blair Promise, PhD, MD  This document serves as a record of services personally performed by Gery Pray, MD. It was created on his behalf by Clerance Lav, a  trained medical scribe. The creation of this record is based on the scribe's personal observations and the provider's statements to them. This document has been checked and approved by the attending provider.

## 2020-08-28 ENCOUNTER — Telehealth (INDEPENDENT_AMBULATORY_CARE_PROVIDER_SITE_OTHER): Payer: Self-pay

## 2020-08-28 ENCOUNTER — Other Ambulatory Visit: Payer: Self-pay | Admitting: *Deleted

## 2020-08-28 DIAGNOSIS — Z170 Estrogen receptor positive status [ER+]: Secondary | ICD-10-CM | POA: Insufficient documentation

## 2020-08-28 DIAGNOSIS — C50411 Malignant neoplasm of upper-outer quadrant of right female breast: Secondary | ICD-10-CM | POA: Insufficient documentation

## 2020-08-28 NOTE — Telephone Encounter (Signed)
Please let her know that I am sorry to hear that and I understand.

## 2020-08-28 NOTE — Telephone Encounter (Signed)
Patient has been advised and will contact us if she needs anything.

## 2020-08-28 NOTE — Telephone Encounter (Signed)
Patient called to let us know that she is going to wait before she schedules a follow up with [place] because she has breast cancer and has had to stop all of the medication. Sending as Juluis Rainier.

## 2020-08-28 NOTE — Progress Notes (Signed)
[place]  [phone]) [phone] [phone]) [phone]     ID: Melissa Mcgrath DOB: 07-03-1960  MR#: [id number]  VCB#:[id number]  Patient Care Team: Melissa Albee, MD as PCP - General (Internal Medicine) Melissa Kaufmann, RN as Oncology Nurse Navigator Melissa Germany, RN as Oncology Nurse Navigator Mcgrath, Melissa Dad, MD as Consulting Physician (Oncology) Melissa Klein, MD as Consulting Physician (General Surgery) Melissa Pray, MD as Consulting Physician (Radiation Oncology) Melissa Queen, MD as Consulting Physician (Obstetrics and Gynecology) Melissa Gallo, MD as Consulting Physician (Dermatology) Melissa Cruel, MD OTHER MD:  CHIEF COMPLAINT: Estrogen receptor positive breast cancer  CURRENT TREATMENT: Neoadjuvant chemotherapy   HISTORY OF CURRENT ILLNESS: Melissa Mcgrath herself palpated an upper-outer right breast lump and noted associated intermittent pain and itching. She underwent bilateral diagnostic mammography with tomography and right breast ultrasonography at [facility] on 08/20/2020 showing: breast density category B; palpable 2.1 cm irregular mass in right breast at 9 o'clock; three abnormal-appearing right axillary tail nodes; indeterminate 4 mm oval mass located 1.8 cm lateral to dominant mass.  Accordingly on 08/21/2020 she proceeded to biopsy of the right breast area in question. The pathology from this procedure ([id number]) showed: invasive mammary carcinoma, e-cadherin positive, grade 3. Prognostic indicators significant for: estrogen receptor, 90% positive with strong staining intensity and progesterone receptor, 2% positive with weak staining intensity. Proliferation marker Ki67 at 40%. HER2 equivocal by immunohistochemistry (2+), but negative by fluorescent in situ hybridization (signals ratio and number per cell not available today).  The biopsied lymph node in the right axillary tail also showed invasive ductal carcinoma. No lymph node  tissue was identified, possibly representing n entirely replaced lymph node.  Cancer Staging Malignant neoplasm of upper-outer quadrant of right breast in female, estrogen receptor positive ([place]) Staging form: Breast, AJCC 8th Edition - Clinical stage from 08/29/2020: Stage IIB (cT2, cN1, cM0, G3, ER+, PR+, HER2-) - Signed by Melissa Cruel, MD on 08/29/2020 Stage prefix: Initial diagnosis Stage used in treatment planning: Yes National guidelines used in treatment planning: Yes Type of national guideline used in treatment planning: NCCN   The patient's subsequent history is as detailed below.   INTERVAL HISTORY: Melissa Mcgrath was evaluated in the multidisciplinary breast cancer clinic on 08/29/2020 accompanied by her sister Melissa Mcgrath. Her case was also presented at the multidisciplinary breast cancer conference on the same day. At that time a preliminary plan was proposed: Neoadjuvant chemotherapy, MammaPrint which likely will show high risk, genetics testing, adjuvant radiation, and antiestrogens   REVIEW OF SYSTEMS: On the provided questionnaire, Melissa Mcgrath reports loss of sleep and palpable breast lump. The patient denies unusual headaches, visual changes, nausea, vomiting, stiff neck, dizziness, or gait imbalance. There has been no cough, phlegm production, or pleurisy, no chest pain or pressure, and no change in bowel or bladder habits. The patient denies fever, rash, bleeding, unexplained fatigue or unexplained weight loss. A detailed review of systems was otherwise entirely negative.   COVID 19 VACCINATION STATUS: Melissa Mcgrath x2, most recently 01/2020, no booster as of 08/29/2020   PAST MEDICAL HISTORY: Past Medical History:  Diagnosis Date  . BV (bacterial vaginosis) 10/27/2014  . Depression 01/05/2014  . Facial basal cell cancer   . Family history of breast cancer   . Family history of melanoma   . Family history of ovarian cancer   . Family history of pancreatic cancer   . Menopause 01/05/2014  .  Vaginal atrophy 11/09/2014  . Vaginal itching 10/27/2014  PAST SURGICAL HISTORY: Past Surgical History:  Procedure Laterality Date  . BREAST ENHANCEMENT SURGERY    . CARPAL TUNNEL RELEASE Right   . refractive lensectomy Bilateral     FAMILY HISTORY: Family History  Problem Relation Age of Onset  . Breast cancer Mother 82       breast  . Hypertension Father   . Heart attack Father   . Alzheimer's disease Father   . Fibromyalgia Sister   . Diabetes Sister   . Heart attack Sister   . Hypertension Sister   . Hypertension Brother   . Diabetes Brother   . Pancreatic cancer Maternal Grandmother 77       Pancreatic  . Melanoma Paternal Uncle        dx 63s  . Melanoma Paternal Uncle        dx 67s  . Ovarian cancer Paternal Aunt        dx 50s/60s  Her father died at age 78 from Alzheimer's. Her mother died at age 87 from metastatic breast cancer. She was initially diagnosed with breast cancer at age 6. Melissa Mcgrath has two brothers and one sister. In addition to her mother, she reports cancer in a paternal aunt (ovarian in mid 32's) and in her maternal grandmother (pancreatic at age 3).    GYNECOLOGIC HISTORY:  No LMP recorded. Patient is postmenopausal. Menarche: 60 years old Age at first live birth: 60 years old [place] P 2 LMP 2010 Contraceptive: used for approx. 20 years HRT used from 11/2019 until diagnosis  Hysterectomy? no BSO? 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 Husband Melissa Mcgrath "Melissa Mcgrath" is retired from working for [employer]. For exercise, she walks daily, rides horses every other week, and does farm work daily. Daughter Melissa Mcgrath, age 71, is a horse trainer/instructor. Daughter Melissa Mcgrath, age 49, is a Copywriter, advertising in [place]. Melissa Mcgrath has one grandchild. She attends [employer]gr[employer].    ADVANCED DIRECTIVES: In the absence of any documentation to the contrary, the patient's spouse is their HCPOA.    HEALTH MAINTENANCE: Social History    Tobacco Use  . Smoking status: Never Smoker  . Smokeless tobacco: Never Used  Vaping Use  . Vaping Use: Never used  Substance Use Topics  . Alcohol use: No  . Drug use: No     Colonoscopy: never done  PAP: 01/2020  Bone density: never done   No Known Allergies  Current Outpatient Medications  Medication Sig Dispense Refill  . estradiol (ESTRACE) 1 MG tablet Take 1 tablet (1 mg total) by mouth daily. 90 tablet 1  . Multiple Vitamin (MULTIVITAMIN) tablet Take 1 tablet by mouth daily. (Patient not taking: Reported on 08/29/2020)    . NONFORMULARY OR COMPOUNDED ITEM Inject 5 mg into the muscle every 7 (seven) days. Testosterone cypionate in olive  oil (25 mg/mL).  Dispense 5 mL vial. (Patient not taking: Reported on 08/29/2020) 1 each 3  . progesterone (PROMETRIUM) 200 MG capsule Take 1 capsule (200 mg total) by mouth daily. (Patient not taking: Reported on 08/29/2020) 90 capsule 1   No current facility-administered medications for this visit.    OBJECTIVE: White woman in no acute distress  Vitals:   08/29/20 0845  BP: (!) 116/58  Pulse: 74  Resp: 20  Temp: 97.9 F (36.6 C)  SpO2: 100%     Body mass index is 21.51 kg/m.   Wt Readings from Last 3 Encounters:  08/29/20 139 lb 6.4 oz (  63.2 kg)  06/04/20 142 lb 12.8 oz (64.8 kg)  03/26/20 140 lb 9.6 oz (63.8 kg)      ECOG FS:1 - Symptomatic but completely ambulatory  Ocular: Sclerae unicteric, pupils round and equal Ear-nose-throat: Wearing a mask Lymphatic: No cervical or supraclavicular adenopathy Lungs no rales or rhonchi Heart regular rate and rhythm Abd soft, nontender, positive bowel sounds MSK no focal spinal tenderness, no joint edema Neuro: non-focal, well-oriented, appropriate affect Breasts: The right breast is status post recent biopsy.  There is a mild ecchymosis.  I do not palpate a well-defined mass.  Left breast and both axillae are benign.   LAB RESULTS:  CMP     Component Value Date/Time   NA 141  08/29/2020 0820   K 3.9 08/29/2020 0820   CL 107 08/29/2020 0820   CO2 26 08/29/2020 0820   GLUCOSE 84 08/29/2020 0820   BUN 13 08/29/2020 0820   CREATININE 0.87 08/29/2020 0820   CREATININE 0.76 12/20/2013 1207   CALCIUM 9.1 08/29/2020 0820   PROT 7.2 08/29/2020 0820   ALBUMIN 4.0 08/29/2020 0820   AST 12 (L) 08/29/2020 0820   ALT 12 08/29/2020 0820   ALKPHOS 68 08/29/2020 0820   BILITOT 0.6 08/29/2020 0820   GFRNONAA >60 08/29/2020 0820    No results found for: TOTALPROTELP, ALBUMINELP, A1GS, A2GS, BETS, BETA2SER, GAMS, MSPIKE, SPEI  Lab Results  Component Value Date   WBC 4.9 08/29/2020   NEUTROABS 2.6 08/29/2020   HGB 12.1 08/29/2020   HCT 36.5 08/29/2020   MCV 94.1 08/29/2020   PLT 260 08/29/2020    No results found for: LABCA2  No components found for: [id number]  No results for input(s): INR in the last 168 hours.  No results found for: LABCA2  No results found for: VOP929  No results found for: WKM628  No results found for: MNO177  No results found for: CA2729  No components found for: HGQUANT  No results found for: CEA1 / No results found for: CEA1   No results found for: AFPTUMOR  No results found for: CHROMOGRNA  No results found for: KPAFRELGTCHN, LAMBDASER, KAPLAMBRATIO (kappa/lambda light chains)  No results found for: HGBA, HGBA2QUANT, HGBFQUANT, HGBSQUAN (Hemoglobinopathy evaluation)   No results found for: LDH  No results found for: IRON, TIBC, IRONPCTSAT (Iron and TIBC)  No results found for: FERRITIN  Urinalysis No results found for: COLORURINE, APPEARANCEUR, LABSPEC, PHURINE, GLUCOSEU, HGBUR, BILIRUBINUR, KETONESUR, PROTEINUR, UROBILINOGEN, NITRITE, LEUKOCYTESUR   STUDIES: No results found.   ELIGIBLE FOR AVAILABLE RESEARCH PROTOCOL: no  ASSESSMENT: 60 y.o. [place] woman status post right breast upper outer quadrant biopsy 08/21/2020 for a clinical mT2 N1, stage IIA/B invasive ductal carcinoma, grade 3, estrogen  receptor positive, progesterone receptor weakly positive, HER-2 not amplified, with an MIB-1 of 40%  (1) genetics testing  (2) neoadjuvant chemotherapy will consist of cyclophosphamide and doxorubicin in dose dense fashion x4 starting 09/18/2020, to be followed by weekly paclitaxel x12  (3) definitive surgery to follow  (4) adjuvant radiation  (5) adjuvant antiestrogens  PLAN: I met today with Melissa Mcgrath to review her new diagnosis. Specifically we discussed the biology of her breast cancer, its diagnosis, staging, treatment  options and prognosis. We first reviewed the fact that cancer is not one disease but more than 100 different diseases and that it is important to keep them separate-- otherwise when friends and relatives discuss their own cancer experiences with Melissa Mcgrath confusion can result. Similarly we explained that if breast cancer spreads to  the bone or liver, the patient would not have bone cancer or liver cancer, but breast cancer in the bone and breast cancer in the liver: one cancer in three places-- not 3 different cancers which otherwise would have to be treated in 3 different ways.  We discussed the difference between local and systemic therapy. In terms of loco-regional treatment, lumpectomy plus radiation is equivalent to mastectomy as far as survival is concerned. For this reason, and because the cosmetic results are generally superior, we recommend breast conserving surgery.   We also noted that in terms of sequencing of treatments, whether systemic therapy or surgery is done first does not affect the ultimate outcome.  This is relevant to Melissa Mcgrath's situation since we believe she will benefit from neoadjuvant treatment, which will make the surgery easier, give her time to plan regarding which surgery she wants (she is considering bilateral mastectomies) and of course give her prognostic information based on response  We then discussed the rationale for systemic therapy. There is some risk  that this cancer may have already spread to other parts of her body. Patients frequently ask at this point about bone scans, CAT scans and PET scans to find out if they have occult breast cancer somewhere else. The problem is that in early stage disease we are much more likely to find false positives then true cancers and this would expose the patient to unnecessary procedures as well as unnecessary radiation. Scans cannot answer the question the patient really would like to know, which is whether she has microscopic disease elsewhere in her body. For those reasons we do not recommend them.  Of course we would proceed to aggressive evaluation of any symptoms that might suggest metastatic disease, but that is not the case here.  Next we went over the options for systemic therapy which are anti-estrogens, anti-HER-2 immunotherapy, and chemotherapy. Melissa Mcgrath does not meet criteria for anti-HER-2 immunotherapy. She is a good candidate for anti-estrogens.  The question of chemotherapy is more complicated. Chemotherapy is most effective in rapidly growing, aggressive tumors like Melissa Mcgrath 's and for that reason I anticipate she will need chemotherapy.  However we are going to request an Oncotype from the definitive surgical sample, as suggested by NCCN guidelines.  Anticipate that we will confirm the need for chemotherapy and we specifically discussed doxorubicin and cyclophosphamide in dose dense fashion x4 followed by weekly paclitaxel x12.  We are hoping to start treatment 09/18/2020  Melissa Mcgrath has a good understanding of the overall plan. She agrees with it. She knows the goal of treatment in her case is cure. She will call with any problems that may develop before her next visit here.  Total encounter time 65 minutes.Sarajane Jews C. Magrinat, MD 08/29/2020 5:16 PM Medical Oncology and Hematology [facility] [place], [place] [address] Tel. [phone]    Fax. [phone]   This document  serves as a record of services personally performed by Lurline Del, MD. It was created on his behalf by [name], a trained medical scribe. The creation of this record is based on the scribe's personal observations and the provider's statements to them.   I, Lurline Del MD, have reviewed the above documentation for accuracy and completeness, and I agree with the above.    *Total Encounter Time as defined by the Centers for Medicare and Medicaid Services includes, in addition to the face-to-face time of a patient visit (documented in the note above) non-face-to-face time: obtaining and  reviewing outside history, ordering and reviewing medications, tests or procedures, care coordination (communications with other health care professionals or caregivers) and documentation in the medical record.

## 2020-08-29 ENCOUNTER — Encounter: Payer: Self-pay | Admitting: Licensed Clinical Social Worker

## 2020-08-29 ENCOUNTER — Inpatient Hospital Stay: Payer: No Typology Code available for payment source | Attending: Oncology | Admitting: Oncology

## 2020-08-29 ENCOUNTER — Encounter: Payer: Self-pay | Admitting: Physical Therapy

## 2020-08-29 ENCOUNTER — Ambulatory Visit
Admission: RE | Admit: 2020-08-29 | Discharge: 2020-08-29 | Disposition: A | Discharge status: Home / Self Care | Payer: No Typology Code available for payment source | Source: Ambulatory Visit | Attending: Radiation Oncology | Admitting: Radiation Oncology

## 2020-08-29 ENCOUNTER — Other Ambulatory Visit: Payer: Self-pay | Admitting: *Deleted

## 2020-08-29 ENCOUNTER — Encounter: Payer: Self-pay | Admitting: *Deleted

## 2020-08-29 ENCOUNTER — Encounter: Payer: Self-pay | Admitting: Genetic Counselor

## 2020-08-29 ENCOUNTER — Inpatient Hospital Stay: Payer: No Typology Code available for payment source

## 2020-08-29 ENCOUNTER — Other Ambulatory Visit: Payer: Self-pay

## 2020-08-29 ENCOUNTER — Ambulatory Visit: Payer: No Typology Code available for payment source | Attending: General Surgery | Admitting: Physical Therapy

## 2020-08-29 ENCOUNTER — Other Ambulatory Visit: Payer: Self-pay | Admitting: General Surgery

## 2020-08-29 ENCOUNTER — Inpatient Hospital Stay (HOSPITAL_BASED_OUTPATIENT_CLINIC_OR_DEPARTMENT_OTHER): Payer: No Typology Code available for payment source | Admitting: Genetic Counselor

## 2020-08-29 VITALS — BP 116/58 | HR 74 | Temp 97.9°F | Resp 20 | Ht 67.5 in | Wt 139.4 lb

## 2020-08-29 DIAGNOSIS — Z808 Family history of malignant neoplasm of other organs or systems: Secondary | ICD-10-CM

## 2020-08-29 DIAGNOSIS — C50411 Malignant neoplasm of upper-outer quadrant of right female breast: Secondary | ICD-10-CM

## 2020-08-29 DIAGNOSIS — Z170 Estrogen receptor positive status [ER+]: Secondary | ICD-10-CM

## 2020-08-29 DIAGNOSIS — R293 Abnormal posture: Secondary | ICD-10-CM | POA: Diagnosis present

## 2020-08-29 DIAGNOSIS — Z800 Family history of malignant neoplasm of digestive organs: Secondary | ICD-10-CM | POA: Insufficient documentation

## 2020-08-29 DIAGNOSIS — Z8041 Family history of malignant neoplasm of ovary: Secondary | ICD-10-CM

## 2020-08-29 DIAGNOSIS — Z5189 Encounter for other specified aftercare: Secondary | ICD-10-CM | POA: Insufficient documentation

## 2020-08-29 DIAGNOSIS — Z803 Family history of malignant neoplasm of breast: Secondary | ICD-10-CM | POA: Diagnosis not present

## 2020-08-29 DIAGNOSIS — C773 Secondary and unspecified malignant neoplasm of axilla and upper limb lymph nodes: Secondary | ICD-10-CM | POA: Diagnosis not present

## 2020-08-29 DIAGNOSIS — Z5111 Encounter for antineoplastic chemotherapy: Secondary | ICD-10-CM | POA: Diagnosis present

## 2020-08-29 LAB — CMP (CANCER CENTER ONLY)
ALT: 12 U/L (ref 0–44)
AST: 12 U/L — ABNORMAL LOW (ref 15–41)
Albumin: 4 g/dL (ref 3.5–5.0)
Alkaline Phosphatase: 68 U/L (ref 38–126)
Anion gap: 8 (ref 5–15)
BUN: 13 mg/dL (ref 6–20)
CO2: 26 mmol/L (ref 22–32)
Calcium: 9.1 mg/dL (ref 8.9–10.3)
Chloride: 107 mmol/L (ref 98–111)
Creatinine: 0.87 mg/dL (ref 0.44–1.00)
GFR, Estimated: >60 mL/min (ref >60)
Glucose, Bld: 84 mg/dL (ref 70–99)
Potassium: 3.9 mmol/L (ref 3.5–5.1)
Sodium: 141 mmol/L (ref 135–145)
Total Bilirubin: 0.6 mg/dL (ref 0.3–1.2)
Total Protein: 7.2 g/dL (ref 6.5–8.1)

## 2020-08-29 LAB — CBC WITH DIFFERENTIAL (CANCER CENTER ONLY)
Abs Immature Granulocytes: 0.01 10*3/uL (ref 0.00–0.07)
Basophils Absolute: 0.1 10*3/uL (ref 0.0–0.1)
Basophils Relative: 2 %
Eosinophils Absolute: 0.1 10*3/uL (ref 0.0–0.5)
Eosinophils Relative: 3 %
HCT: 36.5 % (ref 36.0–46.0)
Hemoglobin: 12.1 g/dL (ref 12.0–15.0)
Immature Granulocytes: 0 %
Lymphocytes Relative: 31 %
Lymphs Abs: 1.6 10*3/uL (ref 0.7–4.0)
MCH: 31.2 pg (ref 26.0–34.0)
MCHC: 33.2 g/dL (ref 30.0–36.0)
MCV: 94.1 fL (ref 80.0–100.0)
Monocytes Absolute: 0.5 10*3/uL (ref 0.1–1.0)
Monocytes Relative: 11 %
Neutro Abs: 2.6 10*3/uL (ref 1.7–7.7)
Neutrophils Relative %: 53 %
Platelet Count: 260 10*3/uL (ref 150–400)
RBC: 3.88 MIL/uL (ref 3.87–5.11)
RDW: 11.9 % (ref 11.5–15.5)
WBC Count: 4.9 10*3/uL (ref 4.0–10.5)
nRBC: 0 % (ref 0.0–0.2)

## 2020-08-29 LAB — GENETIC SCREENING ORDER

## 2020-08-29 MED ORDER — DEXAMETHASONE 4 MG PO TABS: 8.0000 mg | ORAL_TABLET | Freq: Every day | ORAL | 1 refills | Status: DC

## 2020-08-29 MED ORDER — PROCHLORPERAZINE MALEATE 10 MG PO TABS: 10.0000 mg | ORAL_TABLET | Freq: Four times a day (QID) | ORAL | 1 refills | Status: DC | PRN

## 2020-08-29 MED ORDER — LORATADINE 10 MG PO TABS: 10.0000 mg | ORAL_TABLET | Freq: Every day | ORAL | 0 refills | Status: DC

## 2020-08-29 MED ORDER — LORAZEPAM 0.5 MG PO TABS: 0.5000 mg | ORAL_TABLET | Freq: Every evening | ORAL | 0 refills | Status: DC | PRN

## 2020-08-29 NOTE — Progress Notes (Signed)
START ON PATHWAY REGIMEN - Breast ? ? ?  Cycles 1 through 4: A cycle is every 14 days: ?    Doxorubicin  ?    Cyclophosphamide  ?    Pegfilgrastim-xxxx  ?  Cycles 5 through 16: A cycle is every 7 days: ?    Paclitaxel  ? ?**Always confirm dose/schedule in your pharmacy ordering system** ? ?Patient Characteristics: ?Preoperative or Nonsurgical Candidate (Clinical Staging), Neoadjuvant Therapy followed by Surgery, Invasive Disease, Chemotherapy, HER2 Negative/Unknown/Equivocal, ER Positive ?Therapeutic Status: Preoperative or Nonsurgical Candidate (Clinical Staging) ?AJCC M Category: cM0 ?AJCC Grade: G3 ?Breast Surgical Plan: Neoadjuvant Therapy followed by Surgery ?ER Status: Positive (+) ?AJCC 8 Stage Grouping: IIIA ?HER2 Status: Negative (-) ?AJCC T Category: cT2 ?AJCC N Category: cN1 ?PR Status: Negative (-) ?Intent of Therapy: ?Curative Intent, Discussed with Patient ?

## 2020-08-29 NOTE — Progress Notes (Signed)
[place] Work  Initial Assessment   Melissa Mcgrath is a 60 y.o. year old female accompanied by patient and sister Melissa Mcgrath. Clinical Social Work was referred by [facility] for assessment of psychosocial needs.   SDOH (Social Determinants of Health) assessments performed: Yes SDOH Interventions   Flowsheet Row Most Recent Value  SDOH Interventions   Food Insecurity Interventions Intervention Not Indicated  Financial Strain Interventions Intervention Not Indicated  Housing Interventions Intervention Not Indicated  Transportation Interventions Intervention Not Indicated      Distress Screen completed: Yes ONCBCN DISTRESS SCREENING 08/29/2020  Screening Type Initial Screening  Distress experienced in past week (1-10) 3  Practical problem type Insurance  Family Problem type Partner  Emotional problem type Adjusting to illness;Adjusting to appearance changes  Information Concerns Type Lack of info about diagnosis    Family/Social Information:  . Housing Arrangement: patient lives with husband, daughter Melissa Mcgrath. Other daughter and grandson (59 mo) live in [place] . Family members/support persons in your life? Family (sister, daughters) . Transportation concerns: no  . Employment: Works on their farm. Income source: Employment . Financial concerns: Not currently. Figuring out insurance expenses o Type of concern: potentially medical depending on OOP max . Food access concerns: no . Services Currently in place:  n/a  Coping/ Adjustment to diagnosis: . Patient understands treatment plan and what happens next? yes . Concerns about diagnosis and/or treatment: General adjustment to having an illness (normally is very healthy and the caretaker for her husband) . 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 Patient enjoys gardening, being outside, time with family/ friends and going to horse shows . Current coping skills/ strengths: Capable of independent  living, Motivation for treatment/growth, Physical Health and Supportive family/friends    SUMMARY: Current SDOH Barriers:  . No significant SDOH barriers noted today  Clinical Social Work Clinical Goal(s):  Marland Kitchen Patient will find out OOP max for insurance. CSW will send resources on breast cancer foundations to utilize as needed  Interventions: . Discussed common feeling and emotions when being diagnosed with cancer, and the importance of support during treatment . Informed patient of the support team roles and support services at [facility] . Provided CSW contact information and encouraged patient to call with any questions or concerns . Provided patient with information about breast cancer foundations   Follow Up Plan: Patient will determine OOP max and use resources provided for assistance if needed Patient verbalizes understanding of plan: Yes    Christeen Douglas , LCSW

## 2020-08-29 NOTE — Therapy (Signed)
[place], [place], [address] Phone: [phone]   Fax:  [phone]  Physical Therapy Evaluation  Patient Details  Name: Melissa Mcgrath MRN: [id number] Date of Birth: 11-Jun-1961 Referring Provider (PT): Dr. Stark Klein   Encounter Date: 08/29/2020   PT End of Session - 08/29/20 1124    Visit Number 1    Number of Visits 2    Date for PT Re-Evaluation 03/01/21    PT Start Time 1017    PT Stop Time 1050    PT Time Calculation (min) 33 min    Activity Tolerance Patient tolerated treatment well    Behavior During Therapy [facility] for tasks assessed/performed           Past Medical History:  Diagnosis Date  . BV (bacterial vaginosis) 10/27/2014  . Depression 01/05/2014  . Facial basal cell cancer   . Menopause 01/05/2014  . Vaginal atrophy 11/09/2014  . Vaginal itching 10/27/2014    Past Surgical History:  Procedure Laterality Date  . BREAST ENHANCEMENT SURGERY    . CARPAL TUNNEL RELEASE Right   . refractive lensectomy Bilateral     There were no vitals filed for this visit.    Subjective Assessment - 08/29/20 1116    Subjective Patient reports she is here today to be seen by her medical team for her newly diagnosed right breast cancer.    Patient is accompained by: Family member    Pertinent History Patient was diagnosed on 08/21/2020 with right grade III invasive ductal carcinoma breast cancer. It measures 2.1 cm and is located in the upper outer quadrant. It is ER/PR positive and HER2 negative with a Ki67 of 40%. She has 3 abnormal appearing axillary lymph nodes. One was biopsied and is positive for cancer. She had breast implants placed in 1999.    Patient Stated Goals Reduce lymphedema risk and learn post op shoulder ROM HEP    Currently in Pain? No/denies              [facility] PT Assessment - 08/29/20 0001      Assessment   Medical Diagnosis Right breast cancer    Referring Provider (PT)  Dr. Stark Klein    Onset Date/Surgical Date 08/21/20    Hand Dominance Right    Prior Therapy none      Precautions   Precautions Other (comment)    Precaution Comments active cancer      Restrictions   Weight Bearing Restrictions No      Balance Screen   Has the patient fallen in the past 6 months No    Has the patient had a decrease in activity level because of a fear of falling?  No    Is the patient reluctant to leave their home because of a fear of falling?  No      Home Environment   Living Environment Private residence    Living Arrangements Spouse/significant other    Available Help at Discharge Family      Prior Function   Level of Independence Independent    Vocation Full time employment    Vocation Requirements Owns horse barn; boards horses    Leisure She rides horses and walks 3x/week for 45 min      Cognition   Overall Cognitive Status Within Functional Limits for tasks assessed      Posture/Postural Control   Posture/Postural Control Postural limitations    Postural Limitations Rounded Shoulders;Forward head  ROM / Strength   AROM / PROM / Strength AROM;Strength      AROM   Overall AROM Comments Cervical AROM is WNL    AROM Assessment Site Shoulder    Right/Left Shoulder Right;Left    Right Shoulder Extension 48 Degrees    Right Shoulder Flexion 156 Degrees    Right Shoulder ABduction 165 Degrees    Right Shoulder Internal Rotation 73 Degrees    Right Shoulder External Rotation 81 Degrees    Left Shoulder Extension 56 Degrees    Left Shoulder Flexion 155 Degrees    Left Shoulder ABduction 177 Degrees    Left Shoulder Internal Rotation 69 Degrees    Left Shoulder External Rotation 88 Degrees      Strength   Overall Strength Within functional limits for tasks performed             LYMPHEDEMA/ONCOLOGY QUESTIONNAIRE - 08/29/20 0001      Type   Cancer Type Right breast      Lymphedema Assessments   Lymphedema Assessments Upper  extremities      Right Upper Extremity Lymphedema   10 cm Proximal to Olecranon Process 27.7 cm    Olecranon Process 24.7 cm    10 cm Proximal to Ulnar Styloid Process 22.3 cm    Just Proximal to Ulnar Styloid Process 15.5 cm    Across Hand at [employer] 19 cm    At [place] of 2nd Digit 6 cm      Left Upper Extremity Lymphedema   10 cm Proximal to Olecranon Process 26.8 cm    Olecranon Process 23.9 cm    10 cm Proximal to Ulnar Styloid Process 20.6 cm    Just Proximal to Ulnar Styloid Process 15.4 cm    Across Hand at [employer] 18.1 cm    At [place] of 2nd Digit 5.8 cm           L-DEX FLOWSHEETS - 08/29/20 1100      L-DEX LYMPHEDEMA SCREENING   Measurement Type Unilateral    L-DEX MEASUREMENT EXTREMITY Upper Extremity    POSITION  Standing    DOMINANT SIDE Right    At Risk Side Right    BASELINE SCORE (UNILATERAL) 3.9           The patient was assessed using the L-Dex machine today to produce a lymphedema index baseline score. The patient will be reassessed on a regular basis (typically every 3 months) to obtain new L-Dex scores. If the score is > 6.5 points away from his/her baseline score indicating onset of subclinical lymphedema, it will be recommended to wear a compression garment for 4 weeks, 12 hours per day and then be reassessed. If the score continues to be > 6.5 points from baseline at reassessment, we will initiate lymphedema treatment. Assessing in this manner has a 95% rate of preventing clinically significant lymphedema.      Katina Dung - 08/29/20 0001    Open a tight or new jar Mild difficulty    Do heavy household chores (wash walls, wash floors) No difficulty    Carry a shopping bag or briefcase No difficulty    Wash your back No difficulty    Use a knife to cut food No difficulty    Recreational activities in which you take some force or impact through your arm, shoulder, or hand (golf, hammering, tennis) No difficulty    During the past week, to  what extent has your arm, shoulder or  hand problem interfered with your normal social activities with family, friends, neighbors, or groups? Not at all    During the past week, to what extent has your arm, shoulder or hand problem limited your work or other regular daily activities Not at all    Arm, shoulder, or hand pain. None    Tingling (pins and needles) in your arm, shoulder, or hand None    Difficulty Sleeping No difficulty    DASH Score 2.27 %            Objective measurements completed on examination: See above findings.        Patient was instructed today in a home exercise program today for post op shoulder range of motion. These included active assist shoulder flexion in sitting, scapular retraction, wall walking with shoulder abduction, and hands behind head external rotation.  She was encouraged to do these twice a day, holding 3 seconds and repeating 5 times when permitted by her physician.           PT Education - 08/29/20 1123    Education Details Lymphedema risk reduction and post op shoulder ROM HEP    Person(s) Educated Patient;Other (comment)   sister   Methods Explanation;Demonstration;Handout    Comprehension Returned demonstration;Verbalized understanding               PT Long Term Goals - 08/29/20 1131      PT LONG TERM GOAL #1   Title Patient will demonstrate she has regained full shoulder ROM and function post operatively compared to baselines.    Time 6    Period Months    Status New    Target Date 03/01/21           Breast Clinic Goals - 08/29/20 1131      Patient will be able to verbalize understanding of pertinent lymphedema risk reduction practices relevant to her diagnosis specifically related to skin care.   Time 1    Period Days    Status Achieved      Patient will be able to return demonstrate and/or verbalize understanding of the post-op home exercise program related to regaining shoulder range of motion.   Time 1     Period Days    Status Achieved      Patient will be able to verbalize understanding of the importance of attending the postoperative After Breast Cancer Class for further lymphedema risk reduction education and therapeutic exercise.   Time 1    Period Days    Status Achieved                 Plan - 08/29/20 1125    Clinical Impression Statement Patient was diagnosed on 08/21/2020 with right grade III invasive ductal carcinoma breast cancer. It measures 2.1 cm and is located in the upper outer quadrant. It is ER/PR positive and HER2 negative with a Ki67 of 40%. She has 3 abnormal appearing axillary lymph nodes. One was biopsied and is positive for cancer. She had breast implants placed in 1999. Her multidisciplinary medical team met prior to her assessments to determine a recommended treatment plan. She is planning to have Mammaprint testing to determine the need for chemo. If high risk, she will likely undergo neoadjuvant chemotherapy followed by a bilateral mastectomy with a right targeted node dissection, radiation, and anti-estrogen therapy. She will benefit from a post op PT reassessment to determine needs and from L-Dex screens every 3 months for 2 years to detect subclinical lymphedema.  Stability/Clinical Decision Making Stable/Uncomplicated    Clinical Decision Making Low    Rehab Potential Excellent    PT Frequency --   Eval and 1 f/u visit   PT Treatment/Interventions ADLs/Self Care Home Management;Therapeutic exercise;Patient/family education    PT Next Visit Plan Will reassess 3-4 weeks post op    PT Home Exercise Plan Post op shoulder ROM HEP    Consulted and Agree with Plan of Care Patient;Family member/caregiver    Family Member Consulted Sister           Patient will benefit from skilled therapeutic intervention in order to improve the following deficits and impairments:  Postural dysfunction,Decreased range of motion,Decreased knowledge of precautions,Pain,Impaired UE  functional use  Visit Diagnosis: Malignant neoplasm of upper-outer quadrant of right breast in female, estrogen receptor positive ([place]) - Plan: PT plan of care cert/re-cert  Abnormal posture - Plan: PT plan of care cert/re-cert   Patient will follow up at outpatient cancer rehab 3-4 weeks following surgery.  If the patient requires physical therapy at that time, a specific plan will be dictated and sent to the referring physician for approval. The patient was educated today on appropriate basic range of motion exercises to begin post operatively and the importance of attending the After Breast Cancer class following surgery.  Patient was educated today on lymphedema risk reduction practices as it pertains to recommendations that will benefit the patient immediately following surgery.  She verbalized good understanding.      Problem List Patient Active Problem List   Diagnosis Date Noted  . Malignant neoplasm of upper-outer quadrant of right breast in female, estrogen receptor positive ([place]) 08/28/2020  . Vaginal atrophy 11/09/2014  . Vaginal itching 10/27/2014  . BV (bacterial vaginosis) 10/27/2014  . Depression 01/05/2014  . Menopause 01/05/2014   Annia Friendly, PT 08/29/20 12:18 PM   [place], [place], [address] Phone: [phone]   Fax:  [phone]  Name: DIMPLE BASTYR MRN: [id number] Date of Birth: February 10, 1961

## 2020-08-29 NOTE — Progress Notes (Unsigned)
REFERRING PROVIDER: Chauncey Cruel, MD [address] [place],  [place] [address]  PRIMARY PROVIDER:  Doree Albee, MD  PRIMARY REASON FOR VISIT:  1. Malignant neoplasm of upper-outer quadrant of right breast in female, estrogen receptor positive ([place])   2. Family history of breast cancer   3. Family history of pancreatic cancer   4. Family history of melanoma   5. Family history of ovarian cancer      HISTORY OF PRESENT ILLNESS:   Melissa Mcgrath, a 60 y.o. female, was seen for a [place] cancer genetics consultation at the request of Dr. Jana Hakim due to a personal and family history of cancer.  Melissa Mcgrath presents to clinic today to discuss the possibility of a hereditary predisposition to cancer, genetic testing, and to further clarify her future cancer risks, as well as potential cancer risks for family members.   In March of 2022, at the age of 53, Melissa Mcgrath was diagnosed with invasive ductal carcinoma of the right breast. The tumor is ER+/PR+/Her2-. The treatment plan includes neoadjuvant chemotherapy, surgery, radiation therapy, and antiestrogen therapy.   Melissa Mcgrath also has a history of a basal cell carcinoma removed from her face in August of 2021.   CANCER HISTORY:  Oncology History  Malignant neoplasm of upper-outer quadrant of right breast in female, estrogen receptor positive ([place])  08/28/2020 Initial Diagnosis   Malignant neoplasm of upper-outer quadrant of right breast in female, estrogen receptor positive ([place])   08/29/2020 Cancer Staging   Staging form: Breast, AJCC 8th Edition - Clinical stage from 08/29/2020: Stage IIB (cT2, cN1, cM0, G3, ER+, PR+, HER2-) - Signed by Chauncey Cruel, MD on 08/29/2020 Stage prefix: Initial diagnosis Stage used in treatment planning: Yes National guidelines used in treatment planning: Yes Type of national guideline used in treatment planning: NCCN     RISK FACTORS:  Menarche was at age 65.  First live  birth at age 10.  OCP use for approximately 20 years.  Ovaries intact: yes.  Hysterectomy: no.  Menopausal status: postmenopausal.  HRT use: less than 1 year (11/2019-08/2020). Colonoscopy: no; not examined. Mammogram within the last year: yes.   Past Medical History:  Diagnosis Date  . BV (bacterial vaginosis) 10/27/2014  . Depression 01/05/2014  . Facial basal cell cancer   . Family history of breast cancer   . Family history of melanoma   . Family history of ovarian cancer   . Family history of pancreatic cancer   . Menopause 01/05/2014  . Vaginal atrophy 11/09/2014  . Vaginal itching 10/27/2014    Past Surgical History:  Procedure Laterality Date  . BREAST ENHANCEMENT SURGERY    . CARPAL TUNNEL RELEASE Right   . refractive lensectomy Bilateral     Social History   Socioeconomic History  . Marital status: Married    Spouse name: Not on file  . Number of children: Not on file  . Years of education: Not on file  . Highest education level: Not on file  Occupational History  . Not on file  Tobacco Use  . Smoking status: Never Smoker  . Smokeless tobacco: Never Used  Vaping Use  . Vaping Use: Never used  Substance and Sexual Activity  . Alcohol use: No  . Drug use: No  . Sexual activity: Not Currently    Birth control/protection: Post-menopausal  Other Topics Concern  . 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.  Social Determinants of Health   Financial Resource Strain: Low Risk   . Difficulty of Paying Living Expenses: Not very hard  Food Insecurity: No Food Insecurity  . Worried About Charity fundraiser in the Last Year: Never true  . Ran Out of Food in the Last Year: Never true  Transportation Needs: No Transportation Needs  . Lack of Transportation (Medical): No  . Lack of Transportation (Non-Medical): No  Physical Activity: Not on file  Stress: Not on file  Social Connections: Not on file      FAMILY HISTORY:  We obtained a detailed, 4-generation family history.  Significant diagnoses are listed below: Family History  Problem Relation Age of Onset  . Breast cancer Mother 38       breast  . Hypertension Father   . Heart attack Father   . Alzheimer's disease Father   . Fibromyalgia Sister   . Diabetes Sister   . Heart attack Sister   . Hypertension Sister   . Hypertension Brother   . Diabetes Brother   . Pancreatic cancer Maternal Grandmother 6       Pancreatic  . Melanoma Paternal Uncle        dx 42s  . Melanoma Paternal Uncle        dx 75s  . Ovarian cancer Paternal Aunt        dx 50s/60s   Melissa Mcgrath has two daughters (ages 72 and 37). She has two brothers (ages 32 and 5) and one sister (age 51). None of these relatives have had cancer.  Melissa Mcgrath mother died at age 46 from metastatic breast cancer (diagnosed at age 60). There is one maternal aunt, who has had three breast biopsies but has not had cancer. There is no known cancer among maternal cousins. Melissa Mcgrath maternal grandmother died at age 55 from pancreatic cancer. Her maternal grandfather died older than 92 without cancer.  Melissa Mcgrath father died at age 5 without cancer. There were four paternal aunts and four paternal uncles. One aunt died from ovarian cancer (diagnosed in her 27s or 73s). Two uncles (half-brothers to her father) had melanoma (one diagnosed in his 97s, the other diagnosed in his 67s). There is no known cancer among paternal cousins. Melissa Mcgrath paternal grandmother died in her 69s without cancer. Her paternal grandfather died at age 78 without cancer.  Melissa Mcgrath is unaware of previous family history of genetic testing for hereditary cancer risks. Patient's ancestors are of unknown descent. There is no reported Ashkenazi Jewish ancestry. There is no known consanguinity.  GENETIC COUNSELING ASSESSMENT: Melissa Mcgrath is a 60 y.o. female with a personal history of  breast cancer and a family history of breast cancer, pancreatic cancer, ovarian cancer, and melanoma, which is somewhat suggestive of a hereditary cancer syndrome and predisposition to cancer. We, therefore, discussed and recommended the following at today's visit.   DISCUSSION: We discussed that approximately 5-10% of breast cancer is hereditary, with most cases associated with the BRCA1 and BRCA2 genes. There are other genes that can be associated with hereditary breast cancer syndromes. These include ATM, CHEK2, PALB2, etc. We discussed that testing is beneficial for several reasons, including knowing about other cancer risks, identifying potential screening and risk-reduction options that may be appropriate, and to understand if other family members could be at risk for cancer and allow them to undergo genetic testing.  We reviewed the characteristics, features and inheritance patterns of hereditary cancer syndromes. We also discussed genetic testing,  including the appropriate family members to test, the process of testing, insurance coverage and turn-around-time for results. We discussed the implications of a negative, positive and/or variant of uncertain significant result. We recommended Melissa Mcgrath pursue genetic testing for the [facility] CancerNext-Expanded + RNAinsight gene panel.   The CancerNext-Expanded + RNAinsight gene panel offered by [employer] and includes sequencing and rearrangement analysis for the following 77 genes: AIP, ALK, APC, ATM, AXIN2, BAP1, BARD1, BLM, BMPR1A, BRCA1, BRCA2, BRIP1, CDC73, CDH1, CDK4, CDKN1B, CDKN2A, CHEK2, CTNNA1, DICER1, FANCC, FH, FLCN, GALNT12, KIF1B, LZTR1, MAX, MEN1, MET, MLH1, MSH2, MSH3, MSH6, MUTYH, NBN, NF1, NF2, NTHL1, PALB2, PHOX2B, PMS2, POT1, PRKAR1A, PTCH1, PTEN, RAD51C, RAD51D, RB1, RECQL, RET, SDHA, SDHAF2, SDHB, SDHC, SDHD, SMAD4, SMARCA4, SMARCB1, SMARCE1, STK11, SUFU, TMEM127, TP53, TSC1, TSC2, VHL and XRCC2 (sequencing and  deletion/duplication); EGFR, EGLN1, HOXB13, KIT, MITF, PDGFRA, POLD1 and POLE (sequencing only); EPCAM and GREM1 (deletion/duplication only). RNA data is routinely analyzed for use in variant interpretation for all genes.  Based on Melissa Mcgrath's personal and family history of cancer, she meets medical criteria for genetic testing. Despite that she meets criteria, there may still be an out of pocket cost.   PLAN: After considering the risks, benefits, and limitations, Melissa Mcgrath provided informed consent to pursue genetic testing and the blood sample was sent to [facility] for analysis of the CancerNext-Expanded + RNAinsight panel. Results should be available within approximately two-three weeks' time, at which point they will be disclosed by telephone to Melissa Mcgrath, as will any additional recommendations warranted by these results. Melissa Mcgrath will receive a summary of her genetic counseling visit and a copy of her results once available. This information will also be available in Epic.   Melissa Mcgrath questions were answered to her satisfaction today. Our contact information was provided should additional questions or concerns arise. Thank you for the referral and allowing [place] to share in the care of your patient.   Melissa Mcgrath, [place], [facility] Licensed, Certified Dispensing [email or web address] Phone: [phone]  The patient was seen for a total of 25 minutes in face-to-face genetic counseling.  This patient was discussed with Drs. Magrinat, Lindi Adie and/or Burr Medico who agrees with the above.    _______________________________________________________________________ For Office Staff:  Number of people involved in session: 1 Was an Intern/ student involved with case: no

## 2020-08-29 NOTE — Patient Instructions (Signed)

## 2020-08-30 ENCOUNTER — Other Ambulatory Visit: Payer: Self-pay | Admitting: *Deleted

## 2020-08-30 ENCOUNTER — Telehealth: Payer: Self-pay | Admitting: Oncology

## 2020-08-30 DIAGNOSIS — Z170 Estrogen receptor positive status [ER+]: Secondary | ICD-10-CM

## 2020-08-30 DIAGNOSIS — C50411 Malignant neoplasm of upper-outer quadrant of right female breast: Secondary | ICD-10-CM

## 2020-08-30 NOTE — Telephone Encounter (Signed)
Scheduled appts per 3/9 los. Pt confirmed appt dates/times.

## 2020-08-31 ENCOUNTER — Telehealth: Payer: Self-pay

## 2020-08-31 DIAGNOSIS — Z9882 Breast implant status: Secondary | ICD-10-CM | POA: Insufficient documentation

## 2020-08-31 NOTE — Telephone Encounter (Signed)
Returned call to pt regarding her question about date of port placement. I let pt know that Dr, Marlowe Aschoff office would be contacting her with instructions and date of placement. Pt verbalized understanding.

## 2020-09-05 ENCOUNTER — Other Ambulatory Visit: Payer: Self-pay

## 2020-09-05 ENCOUNTER — Other Ambulatory Visit: Payer: Self-pay | Admitting: General Surgery

## 2020-09-05 ENCOUNTER — Ambulatory Visit (HOSPITAL_COMMUNITY)
Admission: RE | Admit: 2020-09-05 | Discharge: 2020-09-05 | Disposition: A | Discharge status: Home / Self Care | Payer: No Typology Code available for payment source | Source: Ambulatory Visit | Attending: Oncology | Admitting: Oncology

## 2020-09-05 DIAGNOSIS — C50411 Malignant neoplasm of upper-outer quadrant of right female breast: Secondary | ICD-10-CM

## 2020-09-05 DIAGNOSIS — Z170 Estrogen receptor positive status [ER+]: Secondary | ICD-10-CM | POA: Diagnosis present

## 2020-09-05 MED ORDER — GADOBUTROL 1 MMOL/ML IV SOLN
6.0000 mL | Freq: Once | INTRAVENOUS | Status: AC | PRN
  Administered 2020-09-05: 6 mL via INTRAVENOUS

## 2020-09-06 ENCOUNTER — Inpatient Hospital Stay: Payer: No Typology Code available for payment source

## 2020-09-06 ENCOUNTER — Other Ambulatory Visit: Payer: Self-pay

## 2020-09-06 ENCOUNTER — Telehealth: Payer: Self-pay | Admitting: *Deleted

## 2020-09-06 ENCOUNTER — Encounter: Payer: Self-pay | Admitting: *Deleted

## 2020-09-06 MED ORDER — LIDOCAINE-PRILOCAINE 2.5-2.5 % EX CREA: 1.0000 "application " | TOPICAL_CREAM | CUTANEOUS | 1 refills | Status: DC | PRN

## 2020-09-06 NOTE — Telephone Encounter (Signed)
Spoke with patient to follow up from [facility] 3/9 and assess navigation needs.  Also informed her of the results of her recent MRI and let her know due to having more lymph nodes a mammaprint will not be needed. She is already scheduled and ready to start chemo on 3/29.  Informed her also that Dr. Jana Hakim may want to do scans (CT chest/bone scan) due to number of suspicious lymph nodes and that I have sent him a message concerning this and will let her know what he says.  Patient verbalized understanding and encouraged her to call with any questions or concerns.

## 2020-09-07 ENCOUNTER — Encounter: Payer: Self-pay | Admitting: *Deleted

## 2020-09-07 ENCOUNTER — Other Ambulatory Visit: Payer: Self-pay | Admitting: *Deleted

## 2020-09-07 ENCOUNTER — Other Ambulatory Visit: Payer: Self-pay | Admitting: Oncology

## 2020-09-07 DIAGNOSIS — Z170 Estrogen receptor positive status [ER+]: Secondary | ICD-10-CM

## 2020-09-07 DIAGNOSIS — C50411 Malignant neoplasm of upper-outer quadrant of right female breast: Secondary | ICD-10-CM

## 2020-09-10 ENCOUNTER — Other Ambulatory Visit: Payer: Self-pay

## 2020-09-10 ENCOUNTER — Encounter (HOSPITAL_BASED_OUTPATIENT_CLINIC_OR_DEPARTMENT_OTHER): Payer: Self-pay | Admitting: General Surgery

## 2020-09-11 ENCOUNTER — Ambulatory Visit (HOSPITAL_COMMUNITY)
Admission: RE | Admit: 2020-09-11 | Discharge: 2020-09-11 | Disposition: A | Discharge status: Home / Self Care | Payer: No Typology Code available for payment source | Source: Ambulatory Visit | Attending: Oncology | Admitting: Oncology

## 2020-09-11 DIAGNOSIS — Z01810 Encounter for preprocedural cardiovascular examination: Secondary | ICD-10-CM | POA: Diagnosis not present

## 2020-09-11 DIAGNOSIS — Z0189 Encounter for other specified special examinations: Secondary | ICD-10-CM | POA: Diagnosis not present

## 2020-09-11 DIAGNOSIS — Z170 Estrogen receptor positive status [ER+]: Secondary | ICD-10-CM | POA: Insufficient documentation

## 2020-09-11 DIAGNOSIS — C50411 Malignant neoplasm of upper-outer quadrant of right female breast: Secondary | ICD-10-CM

## 2020-09-11 LAB — ECHOCARDIOGRAM COMPLETE
Area-P 1/2: 2.07 cm2
S' Lateral: 2.8 cm

## 2020-09-11 NOTE — Progress Notes (Signed)
Pharmacist Chemotherapy Monitoring - Initial Assessment    Anticipated start date: 09/18/20 Regimen:  . Are orders appropriate based on the patient's diagnosis, regimen, and cycle? Yes . Does the plan date match the patient's scheduled date? Yes . Is the sequencing of drugs appropriate? Yes . Are the premedications appropriate for the patient's regimen? Yes . Prior Authorization for treatment is: Approved o If applicable, is the correct biosimilar selected based on the patient's insurance? not applicable  Organ Function and Labs: Marland Kitchen Are dose adjustments needed based on the patient's renal function, hepatic function, or hematologic function? Yes . Are appropriate labs ordered prior to the start of patient's treatment? Yes . Other organ system assessment, if indicated: doxorubicin liposomal: Echo/ MUGA . The following baseline labs, if indicated, have been ordered: N/A  Dose Assessment: . Are the drug doses appropriate? Yes . Are the following correct: o Drug concentrations Yes o IV fluid compatible with drug Yes o Administration routes Yes o Timing of therapy Yes . If applicable, does the patient have documented access for treatment and/or plans for port-a-cath placement? not applicable . If applicable, have lifetime cumulative doses been properly documented and assessed? not applicable Lifetime Dose Tracking  No doses have been documented on this patient for the following tracked chemicals: Doxorubicin, Epirubicin, Idarubicin, Daunorubicin, Mitoxantrone, Bleomycin, Oxaliplatin, Carboplatin, Liposomal Doxorubicin  o   Toxicity Monitoring/Prevention: . The patient has the following take home antiemetics prescribed: Prochlorperazine and dexamethasone .  Marland Kitchen The patient has the following take home medications prescribed: N/A . Medication allergies and previous infusion related reactions, if applicable, have been reviewed and addressed. No . The patient's current medication list has been  assessed for drug-drug interactions with their chemotherapy regimen. no significant drug-drug interactions were identified on review.  Order Review: . Are the treatment plan orders signed? Yes . Is the patient scheduled to see a provider prior to their treatment? Yes  I verify that I have reviewed each item in the above checklist and answered each question accordingly.  Larene Beach, [place], 09/11/2020  4:01 PM

## 2020-09-11 NOTE — Progress Notes (Signed)
  Echocardiogram 2D Echocardiogram has been performed.  Jennette Dubin 09/11/2020, 11:07 AM

## 2020-09-11 NOTE — Progress Notes (Signed)
The following biosimilar Ziextenzo (pegfilgrastim-bmez) has been selected for use in this patient.  Kennith Center, Pharm.D., CPP 09/11/2020@9 :43 AM

## 2020-09-12 MED ORDER — CHLORHEXIDINE GLUCONATE CLOTH 2 % EX PADS: 6.0000 | MEDICATED_PAD | Freq: Once | CUTANEOUS | Status: DC

## 2020-09-12 NOTE — H&P (Signed)
Ilean China Appointment: 08/29/2020 9:00 AM Location: [facility] Patient #: [id number] DOB: [birth year] Undefined / Language: Cleophus Molt / Race: White Female   History of Present Illness Stark Klein MD; 08/29/2020 12:14 PM) The patient is a 60 year old female who presents with breast cancer. Pt is a lovely 60 yo F who presents with a new diagnosis of right breast cancer 08/21/2020. She had a palpable mass for several months and thought it was probably related to her implants (20 years go by Harlow Mares, saline, retropectoral). It seemed to be more prominent and she talked to Dr. Helane Rima who sent her to get diagnostic imaging. She was seen to have a 2.1 cm mass at 9 o'clock on the right with a small satellite mass adjacent to it. There were 3 abnormal appearing nodes as well. 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 The node was also positive.   She is interested in getting the implants removed and having bilateral mastectomies, +/- reconstruction.   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 She is quite active wtih riding and caring for the animals, but has employee that does almost all the cleaning and heavy lifting.   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 She had a mother that had metastatic breast cancer at age 85 and a maternal grandmother wtih pancreatic cancer. Maternal aunt also had breast cancer age 72. She had menarche at age 89, and menopause age 41. She used HRT for around a year. She is a Advertising copywriter and used hormonal contraception for around 20 years. She hasn't had a colonoscopy or bone density study, but has had annual mammography.   Imaging is from [facility] 08/20/20. Films and reports are reviewed in multidisciplinary fashion and are described  above.  pathology also described above, dated 08/21/2020 and from [facility].    CBC, CMET essentially normal 08/29/2020.    Past Surgical History Conni Slipper, RN; 08/29/2020 8:24 AM) Breast Augmentation  Bilateral. Mammoplasty; Reduction  Bilateral.  Diagnostic Studies History Conni Slipper, RN; 08/29/2020 8:24 AM) Colonoscopy  never Mammogram  within last year Pap Smear  1-5 years ago  Medication History Conni Slipper, RN; 08/29/2020 8:24 AM) Medications Reconciled  Social History Conni Slipper, RN; 08/29/2020 8:24 AM) Caffeine use  Tea. No alcohol use  No drug use  Tobacco use  Never smoker.  Family History Conni Slipper, RN; 08/29/2020 8:24 AM) Breast Cancer  Family Members In General, Mother. Cancer  Family Members In General. Diabetes Mellitus  Family Members In General, Sister. Heart Disease  Father. Hypertension  Father, Sister. Respiratory Condition  Brother. Thyroid problems  Sister.  Pregnancy / Birth History Conni Slipper, RN; 08/29/2020 8:24 AM) Age at menarche  27 years. Age of menopause  [age] Contraceptive History  Contraceptive implant, Oral contraceptives. Gravida  3 Length (months) of breastfeeding  12-24 Maternal age  54-30 Para  2 Regular periods   Other Problems Conni Slipper, RN; 08/29/2020 8:24 AM) Cancer     Review of Systems Conni Slipper RN; 08/29/2020 8:24 AM) General Present- Appetite Loss and Fatigue. Not Present- Chills, Fever, Night Sweats, Weight Gain and Weight Loss. Skin Not Present- Change in Wart/Mole, Dryness, Hives, Jaundice, New Lesions, Non-Healing Wounds, Rash and Ulcer. HEENT Not Present- Earache, Hearing Loss, Hoarseness, Nose Bleed, Oral  Ulcers, Ringing in the Ears, Seasonal Allergies, Sinus Pain, Sore Throat, Visual Disturbances, Wears glasses/contact lenses and Yellow Eyes. Respiratory Not Present- Bloody sputum, Chronic Cough, Difficulty Breathing, Snoring and Wheezing. Breast Present- Breast Mass. Not Present-  Breast Pain, Nipple Discharge and Skin Changes. Cardiovascular Not Present- Chest Pain, Difficulty Breathing Lying Down, Leg Cramps, Palpitations, Rapid Heart Rate, Shortness of Breath and Swelling of Extremities. Gastrointestinal Not Present- Abdominal Pain, Bloating, Bloody Stool, Change in Bowel Habits, Chronic diarrhea, Constipation, Difficulty Swallowing, Excessive gas, Gets full quickly at meals, Hemorrhoids, Indigestion, Nausea, Rectal Pain and Vomiting. Female Genitourinary Not Present- Frequency, Nocturia, Painful Urination, Pelvic Pain and Urgency. Neurological Not Present- Decreased Memory, Fainting, Headaches, Numbness, Seizures, Tingling, Tremor, Trouble walking and Weakness. Psychiatric Present- Change in Sleep Pattern. Not Present- Anxiety, Bipolar, Depression, Fearful and Frequent crying. Endocrine Not Present- Cold Intolerance, Excessive Hunger, Hair Changes, Heat Intolerance, Hot flashes and New Diabetes. Hematology Not Present- Blood Thinners, Easy Bruising, Excessive bleeding, Gland problems, HIV and Persistent Infections.  Vitals Stark Klein MD; 08/29/2020 12:04 PM) 08/29/2020 12:03 PM Weight: 139.4 lb Height: 67in Body Surface Area: 1.73 m Body Mass Index: 21.83 kg/m  Temp.: 97.51F  Pulse: 74 (Regular)  Resp.: 20 (Unlabored)  BP: 116/58(Sitting, Left Arm, Standard)       Physical Exam Stark Klein MD; 08/29/2020 12:16 PM) General Mental Status-Alert. General Appearance-Consistent with stated age. Hydration-Well hydrated. Voice-Normal.  Integumentary Note: very tan wtih sun damaged skin   Head and Neck Head-normocephalic, atraumatic with no lesions or palpable masses. Trachea-midline. Thyroid Gland Characteristics - normal size and consistency.  Eye Eyeball - Bilateral-Extraocular movements intact. Sclera/Conjunctiva - Bilateral-No scleral icterus.  Chest and Lung Exam Chest and lung exam reveals -quiet, even and easy  respiratory effort with no use of accessory muscles and on auscultation, normal breath sounds, no adventitious sounds and normal vocal resonance. Inspection Chest Wall - Normal. Back - normal.  Breast Note: implants present. vaguely palpable right mass at 9 o'clock. Feels almost like just dense breast tissue. Palpable LN in right axilla. no nipple retraction or nipple discharge. No skin dimpling. Bruising at biopsy site. left breast without abnormality.   Cardiovascular Cardiovascular examination reveals -normal heart sounds, regular rate and rhythm with no murmurs and normal pedal pulses bilaterally.  Abdomen Inspection Inspection of the abdomen reveals - No Hernias. Palpation/Percussion Palpation and Percussion of the abdomen reveal - Soft, Non Tender, No Rebound tenderness, No Rigidity (guarding) and No hepatosplenomegaly. Auscultation Auscultation of the abdomen reveals - Bowel sounds normal.  Neurologic Neurologic evaluation reveals -alert and oriented x 3 with no impairment of recent or remote memory. Mental Status-Normal.  Musculoskeletal Global Assessment -Note: no gross deformities.  Normal Exam - Left-Upper Extremity Strength Normal and Lower Extremity Strength Normal. Normal Exam - Right-Upper Extremity Strength Normal and Lower Extremity Strength Normal.  Lymphatic Head & Neck  General Head & Neck Lymphatics: Bilateral - Description - Normal. Axillary  General Axillary Region: Bilateral - Description - Normal. Tenderness - Non Tender. Femoral & Inguinal  Generalized Femoral & Inguinal Lymphatics: Bilateral - Description - No Generalized lymphadenopathy.    Assessment & Plan Stark Klein MD; 08/29/2020 12:20 PM) MALIGNANT NEOPLASM OF UPPER-OUTER QUADRANT OF RIGHT BREAST IN FEMALE, ESTROGEN RECEPTOR POSITIVE (C50.411) Impression: Patient has a new diagnosis of right breast cancer, cT2N1. Given the number of nodes that are positive, will plan on  neoadjuvant chemotherapy. Will get MRI given a satellite nodule, dense breast tissue and presence of implant.  She would like bilateral mastectomies  given her family history, diagnosis of cancer, breast density, desire for symmetry and desire to get implants removed. This is very reasonable. For this reason, we would only do additional biopsies on the left if abnormality found on MRI.  I discussed port placement with a model of the port as well as review of surgery and risks. Discussed risk of pneumothorax, clot, malposition, displacement, heart/lung issues and more.  I briefly reviewed bilateral mastectomies. We will refer to plastic surgery for her to start making considerations for type of reconstruction. FAMILY HISTORY OF BREAST CANCER (Z80.3) Impression: Seeing genetics today. HISTORY OF BILATERAL SALINE BREAST IMPLANTS (Z98.82) Impression: She would like them removed at surgery.    Signed electronically by Stark Klein, MD (08/29/2020 12:20 PM)

## 2020-09-12 NOTE — Progress Notes (Signed)
      Enhanced Recovery after Surgery for Orthopedics Enhanced Recovery after Surgery is a protocol used to improve the stress on your body and your recovery after surgery.  Patient Instructions  . The night before surgery:  o No food after midnight. ONLY clear liquids after midnight  . The day of surgery (if you do NOT have diabetes):  o Drink ONE (1) Pre-Surgery Clear Ensure as directed.   o This drink was given to you during your hospital  pre-op appointment visit. o The pre-op nurse will instruct you on the time to drink the  Pre-Surgery Ensure depending on your surgery time. o Finish the drink at the designated time by the pre-op nurse.  o Nothing else to drink after completing the  Pre-Surgery Clear Ensure.  . The day of surgery (if you have diabetes): o Drink ONE (1) Gatorade 2 (G2) as directed. o This drink was given to you during your hospital  pre-op appointment visit.  o The pre-op nurse will instruct you on the time to drink the   Gatorade 2 (G2) depending on your surgery time. o Color of the Gatorade may vary. Red is not allowed. o Nothing else to drink after completing the  Gatorade 2 (G2).         If you have questions, please contact your surgeon's office.  Patient's sister provided soap and given instructions. Verbalized understanding.

## 2020-09-13 ENCOUNTER — Other Ambulatory Visit (HOSPITAL_COMMUNITY)
Admission: RE | Admit: 2020-09-13 | Discharge: 2020-09-13 | Disposition: A | Discharge status: Home / Self Care | Payer: No Typology Code available for payment source | Source: Ambulatory Visit | Attending: General Surgery | Admitting: General Surgery

## 2020-09-13 DIAGNOSIS — Z20822 Contact with and (suspected) exposure to covid-19: Secondary | ICD-10-CM | POA: Diagnosis not present

## 2020-09-13 DIAGNOSIS — Z01812 Encounter for preprocedural laboratory examination: Secondary | ICD-10-CM | POA: Insufficient documentation

## 2020-09-13 LAB — SARS CORONAVIRUS 2 (TAT 6-24 HRS): SARS Coronavirus 2: NEGATIVE

## 2020-09-14 ENCOUNTER — Ambulatory Visit (HOSPITAL_COMMUNITY)
Admission: RE | Admit: 2020-09-14 | Discharge: 2020-09-14 | Disposition: A | Discharge status: Home / Self Care | Payer: No Typology Code available for payment source | Source: Ambulatory Visit | Attending: Oncology | Admitting: Oncology

## 2020-09-14 ENCOUNTER — Telehealth: Payer: Self-pay

## 2020-09-14 ENCOUNTER — Encounter (HOSPITAL_COMMUNITY)
Admission: RE | Admit: 2020-09-14 | Discharge: 2020-09-14 | Disposition: A | Discharge status: Home / Self Care | Payer: No Typology Code available for payment source | Source: Ambulatory Visit | Attending: Oncology | Admitting: Oncology

## 2020-09-14 ENCOUNTER — Encounter: Payer: Self-pay | Admitting: Oncology

## 2020-09-14 ENCOUNTER — Other Ambulatory Visit: Payer: Self-pay

## 2020-09-14 ENCOUNTER — Encounter (HOSPITAL_COMMUNITY): Payer: Self-pay

## 2020-09-14 DIAGNOSIS — Z170 Estrogen receptor positive status [ER+]: Secondary | ICD-10-CM | POA: Insufficient documentation

## 2020-09-14 DIAGNOSIS — C50411 Malignant neoplasm of upper-outer quadrant of right female breast: Secondary | ICD-10-CM | POA: Diagnosis not present

## 2020-09-14 MED ORDER — TECHNETIUM TC 99M MEDRONATE IV KIT
19.8000 | PACK | Freq: Once | INTRAVENOUS | Status: AC | PRN
  Administered 2020-09-14: 19.8 via INTRAVENOUS

## 2020-09-14 MED ORDER — IOHEXOL 300 MG/ML  SOLN
75.0000 mL | Freq: Once | INTRAMUSCULAR | Status: AC | PRN
  Administered 2020-09-14: 75 mL via INTRAVENOUS

## 2020-09-14 NOTE — Telephone Encounter (Signed)
[facility] called report to state pt has splenic infarct. Results called to Dr Lindi Adie. Pt denies s/sx and states she, "feels fine". Pt advised to go to ED if she begins to have vomiting or abdominal pain. Pt verbalized thanks and understanding.

## 2020-09-14 NOTE — Progress Notes (Signed)
Called pt to introduce myself as her Arboriculturist and to discuss copay assistance.  Pt gave me consent to apply in her behalf so I completed theonline application for [employer] for Ziextenzo and she was approved for $10,000 from 09/14/2020 to 06/22/21.  Her OOP for each dose or cycle is $0.  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.  I will give her my card at her next visit for any questions or concernsshe may have in the future.

## 2020-09-17 ENCOUNTER — Encounter (HOSPITAL_BASED_OUTPATIENT_CLINIC_OR_DEPARTMENT_OTHER)
Admission: RE | Disposition: A | Discharge status: Home / Self Care | Payer: Self-pay | Source: Home / Self Care | Attending: General Surgery

## 2020-09-17 ENCOUNTER — Ambulatory Visit (HOSPITAL_BASED_OUTPATIENT_CLINIC_OR_DEPARTMENT_OTHER)
Admission: RE | Admit: 2020-09-17 | Discharge: 2020-09-17 | Disposition: A | Discharge status: Home / Self Care | Payer: No Typology Code available for payment source | Attending: General Surgery | Admitting: General Surgery

## 2020-09-17 ENCOUNTER — Other Ambulatory Visit: Payer: Self-pay

## 2020-09-17 ENCOUNTER — Ambulatory Visit (HOSPITAL_COMMUNITY): Payer: No Typology Code available for payment source

## 2020-09-17 ENCOUNTER — Encounter: Payer: Self-pay | Admitting: *Deleted

## 2020-09-17 ENCOUNTER — Encounter (HOSPITAL_BASED_OUTPATIENT_CLINIC_OR_DEPARTMENT_OTHER): Payer: Self-pay | Admitting: General Surgery

## 2020-09-17 ENCOUNTER — Ambulatory Visit (HOSPITAL_BASED_OUTPATIENT_CLINIC_OR_DEPARTMENT_OTHER): Payer: No Typology Code available for payment source | Admitting: Certified Registered"

## 2020-09-17 DIAGNOSIS — Z803 Family history of malignant neoplasm of breast: Secondary | ICD-10-CM | POA: Diagnosis not present

## 2020-09-17 DIAGNOSIS — C50911 Malignant neoplasm of unspecified site of right female breast: Secondary | ICD-10-CM | POA: Diagnosis present

## 2020-09-17 DIAGNOSIS — Z9882 Breast implant status: Secondary | ICD-10-CM | POA: Diagnosis not present

## 2020-09-17 DIAGNOSIS — Z85828 Personal history of other malignant neoplasm of skin: Secondary | ICD-10-CM | POA: Insufficient documentation

## 2020-09-17 DIAGNOSIS — Z809 Family history of malignant neoplasm, unspecified: Secondary | ICD-10-CM | POA: Diagnosis not present

## 2020-09-17 DIAGNOSIS — Z170 Estrogen receptor positive status [ER+]: Secondary | ICD-10-CM | POA: Insufficient documentation

## 2020-09-17 DIAGNOSIS — Z419 Encounter for procedure for purposes other than remedying health state, unspecified: Secondary | ICD-10-CM

## 2020-09-17 DIAGNOSIS — Z95828 Presence of other vascular implants and grafts: Secondary | ICD-10-CM

## 2020-09-17 HISTORY — PX: PORTACATH PLACEMENT: SHX2246

## 2020-09-17 SURGERY — INSERTION, TUNNELED CENTRAL VENOUS DEVICE, WITH PORT
Anesthesia: General | Site: Breast

## 2020-09-17 MED ORDER — ONDANSETRON HCL 4 MG/2ML IJ SOLN
INTRAMUSCULAR | Status: DC | PRN
  Administered 2020-09-17: 4 mg via INTRAVENOUS

## 2020-09-17 MED ORDER — HEPARIN (PORCINE) IN NACL 1000-0.9 UT/500ML-% IV SOLN
INTRAVENOUS | Status: AC
  Filled 2020-09-17: qty 500

## 2020-09-17 MED ORDER — CEFAZOLIN SODIUM-DEXTROSE 2-4 GM/100ML-% IV SOLN
2.0000 g | INTRAVENOUS | Status: AC
  Administered 2020-09-17: 2 g via INTRAVENOUS

## 2020-09-17 MED ORDER — PHENYLEPHRINE HCL (PRESSORS) 10 MG/ML IV SOLN
INTRAVENOUS | Status: DC | PRN
  Administered 2020-09-17: 80 ug via INTRAVENOUS

## 2020-09-17 MED ORDER — EPHEDRINE SULFATE 50 MG/ML IJ SOLN
INTRAMUSCULAR | Status: DC | PRN
  Administered 2020-09-17 (×2): 10 mg via INTRAVENOUS

## 2020-09-17 MED ORDER — BUPIVACAINE-EPINEPHRINE (PF) 0.25% -1:200000 IJ SOLN
INTRAMUSCULAR | Status: DC | PRN
  Administered 2020-09-17: 10 mL via PERINEURAL

## 2020-09-17 MED ORDER — MIDAZOLAM HCL 5 MG/5ML IJ SOLN
INTRAMUSCULAR | Status: DC | PRN
  Administered 2020-09-17: 2 mg via INTRAVENOUS

## 2020-09-17 MED ORDER — PROPOFOL 10 MG/ML IV BOLUS
INTRAVENOUS | Status: DC | PRN
  Administered 2020-09-17: 110 mg via INTRAVENOUS

## 2020-09-17 MED ORDER — ACETAMINOPHEN 500 MG PO TABS
1000.0000 mg | ORAL_TABLET | ORAL | Status: AC
  Administered 2020-09-17: 1000 mg via ORAL

## 2020-09-17 MED ORDER — FENTANYL CITRATE (PF) 100 MCG/2ML IJ SOLN
INTRAMUSCULAR | Status: DC | PRN
  Administered 2020-09-17: 50 ug via INTRAVENOUS

## 2020-09-17 MED ORDER — LACTATED RINGERS IV SOLN: INTRAVENOUS | Status: DC | PRN

## 2020-09-17 MED ORDER — OXYCODONE HCL 5 MG PO TABS: 2.5000 mg | ORAL_TABLET | Freq: Four times a day (QID) | ORAL | 0 refills | Status: DC | PRN

## 2020-09-17 MED ORDER — FENTANYL CITRATE (PF) 100 MCG/2ML IJ SOLN
INTRAMUSCULAR | Status: AC
  Filled 2020-09-17: qty 2

## 2020-09-17 MED ORDER — HEPARIN SOD (PORK) LOCK FLUSH 100 UNIT/ML IV SOLN
INTRAVENOUS | Status: DC | PRN
  Administered 2020-09-17: 500 [IU] via INTRAVENOUS

## 2020-09-17 MED ORDER — LIDOCAINE HCL 1 % IJ SOLN
INTRAMUSCULAR | Status: DC | PRN
  Administered 2020-09-17: 10 mL

## 2020-09-17 MED ORDER — HEPARIN SOD (PORK) LOCK FLUSH 100 UNIT/ML IV SOLN
INTRAVENOUS | Status: AC
  Filled 2020-09-17: qty 5

## 2020-09-17 MED ORDER — DROPERIDOL 2.5 MG/ML IJ SOLN: 0.6250 mg | Freq: Once | INTRAMUSCULAR | Status: DC | PRN

## 2020-09-17 MED ORDER — MEPERIDINE HCL 25 MG/ML IJ SOLN
6.2500 mg | INTRAMUSCULAR | Status: DC | PRN
Start: 2020-09-17 — End: 2020-09-18

## 2020-09-17 MED ORDER — ACETAMINOPHEN 500 MG PO TABS
ORAL_TABLET | ORAL | Status: AC
  Filled 2020-09-17: qty 2

## 2020-09-17 MED ORDER — BUPIVACAINE-EPINEPHRINE (PF) 0.25% -1:200000 IJ SOLN
INTRAMUSCULAR | Status: AC
  Filled 2020-09-17: qty 30

## 2020-09-17 MED ORDER — HEPARIN (PORCINE) IN NACL 2-0.9 UNITS/ML
INTRAMUSCULAR | Status: AC | PRN
  Administered 2020-09-17: 1 via INTRAVENOUS

## 2020-09-17 MED ORDER — ESMOLOL HCL 100 MG/10ML IV SOLN
INTRAVENOUS | Status: AC
  Filled 2020-09-17: qty 20

## 2020-09-17 MED ORDER — OXYCODONE HCL 5 MG PO TABS: 5.0000 mg | ORAL_TABLET | Freq: Once | ORAL | Status: DC | PRN

## 2020-09-17 MED ORDER — FENTANYL CITRATE (PF) 100 MCG/2ML IJ SOLN: 25.0000 ug | INTRAMUSCULAR | Status: DC | PRN

## 2020-09-17 MED ORDER — OXYCODONE HCL 5 MG/5ML PO SOLN
5.0000 mg | Freq: Once | ORAL | Status: DC | PRN
Start: 2020-09-17 — End: 2020-09-18

## 2020-09-17 MED ORDER — CEFAZOLIN SODIUM-DEXTROSE 2-4 GM/100ML-% IV SOLN
INTRAVENOUS | Status: AC
  Filled 2020-09-17: qty 100

## 2020-09-17 MED ORDER — DEXAMETHASONE SODIUM PHOSPHATE 4 MG/ML IJ SOLN
INTRAMUSCULAR | Status: DC | PRN
  Administered 2020-09-17: 4 mg via INTRAVENOUS

## 2020-09-17 MED ORDER — LIDOCAINE 2% (20 MG/ML) 5 ML SYRINGE
INTRAMUSCULAR | Status: DC | PRN
  Administered 2020-09-17: 40 mg via INTRAVENOUS

## 2020-09-17 MED ORDER — MIDAZOLAM HCL 2 MG/2ML IJ SOLN
INTRAMUSCULAR | Status: AC
  Filled 2020-09-17: qty 2

## 2020-09-17 SURGICAL SUPPLY — 44 items
ADH SKN CLS APL DERMABOND .7 (GAUZE/BANDAGES/DRESSINGS) ×1
APL PRP STRL LF DISP 70% ISPRP (MISCELLANEOUS) ×1
BAG DECANTER FOR FLEXI CONT (MISCELLANEOUS) ×2 IMPLANT
BLADE HEX COATED 2.75 (ELECTRODE) ×2 IMPLANT
BLADE SURG 11 STRL SS (BLADE) ×2 IMPLANT
BLADE SURG 15 STRL LF DISP TIS (BLADE) ×1 IMPLANT
BLADE SURG 15 STRL SS (BLADE) ×2
CHLORAPREP W/TINT 26 (MISCELLANEOUS) ×2 IMPLANT
COVER BACK TABLE 60X90IN (DRAPES) ×2 IMPLANT
COVER MAYO STAND STRL (DRAPES) ×2 IMPLANT
COVER WAND RF STERILE (DRAPES) IMPLANT
DECANTER SPIKE VIAL GLASS SM (MISCELLANEOUS) IMPLANT
DERMABOND ADVANCED (GAUZE/BANDAGES/DRESSINGS) ×1
DERMABOND ADVANCED .7 DNX12 (GAUZE/BANDAGES/DRESSINGS) ×1 IMPLANT
DRAPE C-ARM 42X72 X-RAY (DRAPES) ×2 IMPLANT
DRAPE LAPAROTOMY TRNSV 102X78 (DRAPES) ×2 IMPLANT
DRAPE UTILITY XL STRL (DRAPES) ×2 IMPLANT
DRSG TEGADERM 4X4.75 (GAUZE/BANDAGES/DRESSINGS) ×4 IMPLANT
ELECT REM PT RETURN 9FT ADLT (ELECTROSURGICAL) ×2
ELECTRODE REM PT RTRN 9FT ADLT (ELECTROSURGICAL) ×1 IMPLANT
GAUZE SPONGE 4X4 12PLY STRL LF (GAUZE/BANDAGES/DRESSINGS) IMPLANT
GLOVE SURG ENC MOIS LTX SZ6 (GLOVE) ×1 IMPLANT
GLOVE SURG SS PI 8.5 STRL IVOR (GLOVE) ×1
GLOVE SURG SS PI 8.5 STRL STRW (GLOVE) IMPLANT
GLOVE SURG UNDER POLY LF SZ6.5 (GLOVE) ×2 IMPLANT
GOWN STRL REUS W/ TWL LRG LVL3 (GOWN DISPOSABLE) ×1 IMPLANT
GOWN STRL REUS W/TWL 2XL LVL3 (GOWN DISPOSABLE) ×3 IMPLANT
GOWN STRL REUS W/TWL LRG LVL3 (GOWN DISPOSABLE)
IV CONNECTOR ONE LINK NDLESS (IV SETS) ×4 IMPLANT
KIT PORT POWER 8FR ISP CVUE (Port) ×1 IMPLANT
NDL HYPO 25X1 1.5 SAFETY (NEEDLE) ×1 IMPLANT
NEEDLE HYPO 25X1 1.5 SAFETY (NEEDLE) ×2 IMPLANT
PACK BASIN DAY SURGERY FS (CUSTOM PROCEDURE TRAY) ×2 IMPLANT
PENCIL SMOKE EVACUATOR (MISCELLANEOUS) ×2 IMPLANT
SLEEVE SCD COMPRESS KNEE MED (STOCKING) ×2 IMPLANT
SUT MNCRL AB 4-0 PS2 18 (SUTURE) ×2 IMPLANT
SUT PROLENE 2 0 SH DA (SUTURE) ×4 IMPLANT
SUT VIC AB 3-0 SH 27 (SUTURE) ×2
SUT VIC AB 3-0 SH 27X BRD (SUTURE) ×1 IMPLANT
SUT VICRYL 3-0 CR8 SH (SUTURE) IMPLANT
SYR 10ML LL (SYRINGE) ×3 IMPLANT
SYR 5ML LUER SLIP (SYRINGE) ×2 IMPLANT
SYR CONTROL 10ML LL (SYRINGE) ×2 IMPLANT
TOWEL GREEN STERILE FF (TOWEL DISPOSABLE) ×2 IMPLANT

## 2020-09-17 NOTE — Transfer of Care (Signed)
Immediate Anesthesia Transfer of Care Note  Patient: Melissa Mcgrath  Procedure(s) Performed: INSERTION PORT-A-CATH (N/A Breast)  Patient Location: PACU  Anesthesia Type:General  Level of Consciousness: sedated  Airway & Oxygen Therapy: Patient Spontanous Breathing and Patient connected to face mask oxygen  Post-op Assessment: Report given to RN and Post -op Vital signs reviewed and stable  Post vital signs: Reviewed and stable  Last Vitals:  Vitals Value Taken Time  BP 122/58 09/17/20 1806  Temp 36.4 C 09/17/20 1806  Pulse 59 09/17/20 1809  Resp 10 09/17/20 1809  SpO2 100 % 09/17/20 1809  Vitals shown include unvalidated device data.  Last Pain:  Vitals:   09/17/20 1402  TempSrc: Oral  PainSc: 0-No pain         Complications: No complications documented.

## 2020-09-17 NOTE — Interval H&P Note (Signed)
History and Physical Interval Note:  09/17/2020 3:21 PM  Melissa Mcgrath  has presented today for surgery, with the diagnosis of RIGHT BREAST CANCER.  The various methods of treatment have been discussed with the patient and family. After consideration of risks, benefits and other options for treatment, the patient has consented to  Procedure(s): INSERTION PORT-A-CATH (N/A) as a surgical intervention.  The patient's history has been reviewed, patient examined, no change in status, stable for surgery.  I have reviewed the patient's chart and labs.  Questions were answered to the patient's satisfaction.     Stark Klein

## 2020-09-17 NOTE — Anesthesia Procedure Notes (Signed)
Procedure Name: LMA Insertion Date/Time: 09/17/2020 5:13 PM Performed by: Maryella Shivers, CRNA Pre-anesthesia Checklist: Patient identified, Emergency Drugs available, Suction available and Patient being monitored Patient Re-evaluated:Patient Re-evaluated prior to induction Oxygen Delivery Method: Circle system utilized Preoxygenation: Pre-oxygenation with 100% oxygen Induction Type: IV induction Ventilation: Mask ventilation without difficulty LMA: LMA inserted LMA Size: 4.0 Number of attempts: 1 Airway Equipment and Method: Bite block Placement Confirmation: positive ETCO2 Tube secured with: Tape Dental Injury: Teeth and Oropharynx as per pre-operative assessment

## 2020-09-17 NOTE — Anesthesia Preprocedure Evaluation (Signed)
Anesthesia Evaluation    Reviewed: Allergy & Precautions, Patient's Chart, lab work & pertinent test results, Unable to perform ROS - Chart review only  Airway Mallampati: II  TM Distance: >3 FB Neck ROM: Full    Dental no notable dental hx. (+) Dental Advisory Given, Teeth Intact   Pulmonary neg pulmonary ROS,    Pulmonary exam normal breath sounds clear to auscultation       Cardiovascular negative cardio ROS Normal cardiovascular exam Rhythm:Regular Rate:Normal     Neuro/Psych PSYCHIATRIC DISORDERS Depression negative neurological ROS     GI/Hepatic negative GI ROS, Neg liver ROS,   Endo/Other  negative endocrine ROS  Renal/GU negative Renal ROS     Musculoskeletal negative musculoskeletal ROS (+)   Abdominal   Peds  Hematology negative hematology ROS (+)   Anesthesia Other Findings   Reproductive/Obstetrics                            Anesthesia Physical Anesthesia Plan  ASA: II  Anesthesia Plan: General   Post-op Pain Management:    Induction: Intravenous  PONV Risk Score and Plan: 4 or greater and Ondansetron, Dexamethasone, Midazolam, Diphenhydramine and Treatment may vary due to age or medical condition  Airway Management Planned: LMA  Additional Equipment: None  Intra-op Plan:   Post-operative Plan: Extubation in OR  Informed Consent: I have reviewed the patients History and Physical, chart, labs and discussed the procedure including the risks, benefits and alternatives for the proposed anesthesia with the patient or authorized representative who has indicated his/her understanding and acceptance.     Dental advisory given  Plan Discussed with: CRNA  Anesthesia Plan Comments:         Anesthesia Quick Evaluation

## 2020-09-17 NOTE — Op Note (Addendum)
PREOPERATIVE DIAGNOSIS:  Right breast cancer, cT2N2, grade 3, +/+/-     POSTOPERATIVE DIAGNOSIS:  Same     PROCEDURE: Left subclavian port placement, Bard ClearVue Power Port, MRI safe, 8-French.      SURGEON:  Stark Klein, MD      ANESTHESIA:  General   FINDINGS:  Good venous return, easy flush, and tip of the catheter and   SVC 18.5 cm.      SPECIMEN:  None.      ESTIMATED BLOOD LOSS:  Minimal.      COMPLICATIONS:  None known.      PROCEDURE:  Pt was identified in the holding area and taken to   the operating room, where patient was placed supine on the operating room   table.  General anesthesia was induced.  Patient's arms were tucked and the upper   chest and neck were prepped and draped in sterile fashion.  Time-out was   performed according to the surgical safety check list.  When all was   correct, we continued.   Local anesthetic was administered over this   area at the angle of the clavicle.  The vein was accessed with 1 pass(es) of the needle. There was good venous return and the wire passed easily with no ectopy.   Fluoroscopy was used to confirm that the wire was in the vena cava.      The patient was placed back level and the area for the pocket was anethetized   with local anesthetic.  A 3-cm transverse incision was made with a #15   blade.  Cautery was used to divide the subcutaneous tissues down to the   pectoralis muscle.  An Army-Navy retractor was used to elevate the skin   while a pocket was created on top of the pectoralis fascia.  The port   was placed into the pocket to confirm that it was of adequate size.  The   catheter was preattached to the port.  The port was then secured to the   pectoralis fascia with four 2-0 Prolene sutures.  These were clamped and   not tied down yet.    The catheter was tunneled through to the wire exit   site.  The catheter was placed along the wire to determine what length it should be to be in the SVC.  The catheter was  cut at 18.5 cm.  The tunneler sheath and dilator were passed over the wire and the dilator and wire were removed.  The catheter was advanced through the tunneler sheath and the tunneler sheath was pulled away.  Care was taken to keep the catheter in the tunneler sheath as this occurred. This was advanced and the tunneler sheath was removed.  There was good venous   return and easy flush of the catheter.  The Prolene sutures were tied   down to the pectoral fascia.  The skin was reapproximated using 3-0   Vicryl interrupted deep dermal sutures.    Fluoroscopy was used to re-confirm good position of the catheter.  The skin   was then closed using 4-0 Monocryl in a subcuticular fashion.  The port was flushed with concentrated heparin flush as well.  The wounds were then cleaned, dried, and dressed with Dermabond.   The port was left accessed.     The patient was awakened from anesthesia and taken to the PACU in stable condition.  Needle, sponge, and instrument counts were correct.  Stark Klein, MD

## 2020-09-17 NOTE — Discharge Instructions (Addendum)
[name] Office Phone Number [phone]   POST OP INSTRUCTIONS  Always review your discharge instruction sheet given to you by the facility where your surgery was performed.  IF YOU HAVE DISABILITY OR FAMILY LEAVE FORMS, YOU MUST BRING THEM TO THE OFFICE FOR PROCESSING.  DO NOT GIVE THEM TO YOUR DOCTOR.  1. A prescription for pain medication may be given to you upon discharge.  Take your pain medication as prescribed, if needed.  If narcotic pain medicine is not needed, then you may take acetaminophen (Tylenol) or ibuprofen (Advil) as needed. 2. Take your usually prescribed medications unless otherwise directed 3. If you need a refill on your pain medication, please contact your pharmacy.  They will contact our office to request authorization.  Prescriptions will not be filled after 5pm or on week-ends. 4. You should eat very light the first 24 hours after surgery, such as soup, crackers, pudding, etc.  Resume your normal diet the day after surgery 5. It is common to experience some constipation if taking pain medication after surgery.  Increasing fluid intake and taking a stool softener will usually help or prevent this problem from occurring.  A mild laxative (Milk of Magnesia or Miralax) should be taken according to package directions if there are no bowel movements after 48 hours. 6. You may shower in 48 hours.  The surgical glue will flake off in 2-3 weeks.   7. ACTIVITIES:  No strenuous activity or heavy lifting for 1 week.   a. You may drive when you no longer are taking prescription pain medication, you can comfortably wear a seatbelt, and you can safely maneuver your car and apply brakes. b. RETURN TO WORK:  __________as tolerated _______________ Melissa Mcgrath should see your doctor in the office for a follow-up appointment approximately three-four weeks after your surgery.    WHEN TO CALL YOUR DOCTOR: 1. Fever over 101.0 2. Nausea and/or vomiting. 3. Extreme swelling or  bruising. 4. Continued bleeding from incision. 5. Increased pain, redness, or drainage from the incision.  The clinic staff is available to answer your questions during regular business hours.  Please don't hesitate to call and ask to speak to one of the nurses for clinical concerns.  If you have a medical emergency, go to the nearest emergency room or call 911.  A surgeon from [facility] is always on call at the hospital.  For further questions, please visit [email or web address]    Post Anesthesia Home Care Instructions  Activity: Get plenty of rest for the remainder of the day. A responsible individual must stay with you for 24 hours following the procedure.  For the next 24 hours, DO NOT: -Drive a car -Paediatric nurse -Drink alcoholic beverages -Take any medication unless instructed by your physician -Make any legal decisions or sign important papers.  Meals: Start with liquid foods such as gelatin or soup. Progress to regular foods as tolerated. Avoid greasy, spicy, heavy foods. If nausea and/or vomiting occur, drink only clear liquids until the nausea and/or vomiting subsides. Call your physician if vomiting continues.  Special Instructions/Symptoms: Your throat may feel dry or sore from the anesthesia or the breathing tube placed in your throat during surgery. If this causes discomfort, gargle with warm salt water. The discomfort should disappear within 24 hours.  If you had a scopolamine patch placed behind your ear for the management of post- operative nausea and/or vomiting:  1. The medication in the patch is effective for 72 hours, after which it  should be removed.  Wrap patch in a tissue and discard in the trash. Wash hands thoroughly with soap and water. 2. You may remove the patch earlier than 72 hours if you experience unpleasant side effects which may include dry mouth, dizziness or visual disturbances. 3. Avoid touching the patch. Wash your hands  with soap and water after contact with the patch.    No Tylenol until 8:10PM.

## 2020-09-17 NOTE — Progress Notes (Signed)
[place]  [phone] [phone]) [phone]     ID: Melissa Mcgrath DOB: 10-03-1960  MR#: [id number]  QVZ#:[id number]  Patient Care Team: Doree Albee, MD as PCP - General (Internal Medicine) Mauro Kaufmann, RN as Oncology Nurse Navigator Rockwell Germany, RN as Oncology Nurse Navigator Magrinat, Virgie Dad, MD as Consulting Physician (Oncology) Stark Klein, MD as Consulting Physician (General Surgery) Gery Pray, MD as Consulting Physician (Radiation Oncology) Dian Queen, MD as Consulting Physician (Obstetrics and Gynecology) Ulla Gallo, MD as Consulting Physician (Dermatology) Chauncey Cruel, MD OTHER MD:  CHIEF COMPLAINT: Estrogen receptor positive breast cancer  CURRENT TREATMENT: Neoadjuvant chemotherapy   INTERVAL HISTORY: Melissa Mcgrath returns today for follow up and treatment of her estrogen receptor positive breast cancer. She was evaluated in the multidisciplinary breast cancer clinic on 08/29/2020.  She is accompanied by her mother  Since consultation, she underwent breast MRI on 09/05/2020 showing: breast composition C; 2.6 cm biopsy-proven malignancy in right breast at 9 o'clock with probable invasion of adjacent pectoralis major muscle; 2 adjacent possible metastatic intramammary lymph nodes in lower-outer right breast; 4 right axillary lymph nodes suspicious for metastasis, including recently biopsied metastatic node; no evidence of malignancy on left.  She also underwent echocardiogram on 09/11/2020 showing an ejection fraction of 60-65%.  She also underwent staging chest CT and bone scan on 09/14/2020. 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  Bone scan showed no definite evidence of osseous metastases.  Finally, she underwent port placement yesterday, 09/17/2020.  She is now ready  to begin neoadjuvant chemotherapy, which will consist of cyclophosphamide and doxorubicin in dose dense fashion x4, today. This will be followed by weekly paclitaxel x12   REVIEW OF SYSTEMS: Melissa Mcgrath met with her chemotherapy teaching nurse and greatly appreciated the information.  She has a good understanding on how to take her supportive meds and she has all the drugs on hand.  A detailed review of systems today was otherwise stable   COVID 19 VACCINATION STATUS: [place] x2, most recently 01/2020, no booster as of 08/29/2020   HISTORY OF CURRENT ILLNESS: From the original intake note:  Melissa Mcgrath herself palpated an upper-outer right breast lump and noted associated intermittent pain and itching. She underwent bilateral diagnostic mammography with tomography and right breast ultrasonography at [facility] on 08/20/2020 showing: breast density category B; palpable 2.1 cm irregular mass in right breast at 9 o'clock; three abnormal-appearing right axillary tail nodes; indeterminate 4 mm oval mass located 1.8 cm lateral to dominant mass.  Accordingly on 08/21/2020 she proceeded to biopsy of the right breast area in question. The pathology from this procedure ([id number]) showed: invasive mammary carcinoma, e-cadherin positive, grade 3. Prognostic indicators significant for: estrogen receptor, 90% positive with strong staining intensity and progesterone receptor, 2% positive with weak staining intensity. Proliferation marker Ki67 at 40%. HER2 equivocal by immunohistochemistry (2+), but negative by fluorescent in situ hybridization (signals ratio and number per cell not available today).  The biopsied lymph node in the right axillary tail also showed invasive ductal carcinoma. No lymph node tissue was identified, possibly representing an entirely replaced lymph node.  Cancer Staging Malignant neoplasm of upper-outer quadrant of right breast in female, estrogen receptor positive ([place]) Staging form: Breast,  AJCC 8th Edition - Clinical stage from 08/29/2020: Stage IIIA (cT2, cN2, cM0, G3, ER+, PR+, HER2-) - Signed by  Gardenia Phlegm, NP on 09/07/2020 Stage prefix: Initial diagnosis Stage used in treatment planning: Yes National guidelines used in treatment planning: Yes Type of national guideline used in treatment planning: NCCN  The patient's subsequent history is as detailed below.   PAST MEDICAL HISTORY: Past Medical History:  Diagnosis Date  . BV (bacterial vaginosis) 10/27/2014  . Depression 01/05/2014  . Facial basal cell cancer   . Family history of breast cancer   . Family history of melanoma   . Family history of ovarian cancer   . Family history of pancreatic cancer   . Menopause 01/05/2014  . Vaginal atrophy 11/09/2014  . Vaginal itching 10/27/2014    PAST SURGICAL HISTORY: Past Surgical History:  Procedure Laterality Date  . BREAST ENHANCEMENT SURGERY    . CARPAL TUNNEL RELEASE Right   . refractive lensectomy Bilateral     FAMILY HISTORY: Family History  Problem Relation Age of Onset  . Breast cancer Mother 24       breast  . Hypertension Father   . Heart attack Father   . Alzheimer's disease Father   . Fibromyalgia Sister   . Diabetes Sister   . Heart attack Sister   . Hypertension Sister   . Hypertension Brother   . Diabetes Brother   . Pancreatic cancer Maternal Grandmother 39       Pancreatic  . Melanoma Paternal Uncle        dx 21s  . Melanoma Paternal Uncle        dx 62s  . Ovarian cancer Paternal Aunt        dx 50s/60s  Her father died at age 31 from Alzheimer's. Her mother died at age 4 from metastatic breast cancer. She was initially diagnosed with breast cancer at age 92. Melissa Mcgrath has two brothers and one sister. In addition to her mother, she reports cancer in a paternal aunt (ovarian in mid 38's) and in her maternal grandmother (pancreatic at age 87).    GYNECOLOGIC HISTORY:  No LMP recorded. Patient is postmenopausal. Menarche: 60 years  old Age at first live birth: 60 years old [place] P 2 LMP 2010 Contraceptive: used for approx. 20 years HRT used from 11/2019 until diagnosis  Hysterectomy? no BSO? 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 Husband Herbie [place] "Fritz Pickerel" is retired from working for [employer]. For exercise, she walks daily, rides horses every other week, and does farm work daily. Daughter Cline Cools, age 43, is a horse trainer/instructor. Daughter Lavell Anchors, age 35, is a Copywriter, advertising in [place]. Noelani has one grandchild. She attends [employer].    ADVANCED DIRECTIVES: In the absence of any documentation to the contrary, the patient's spouse is their HCPOA.    HEALTH MAINTENANCE: Social History   Tobacco Use  . Smoking status: Never Smoker  . Smokeless tobacco: Never Used  Vaping Use  . Vaping Use: Never used  Substance Use Topics  . Alcohol use: No  . Drug use: No     Colonoscopy: never done  PAP: 01/2020  Bone density: never done   No Known Allergies  Current Outpatient Medications  Medication Sig Dispense Refill  . dexamethasone (DECADRON) 4 MG tablet Take 2 tablets (8 mg total) by mouth daily. Take daily for 3 days after chemo. Take with food. 30 tablet 1  . lidocaine-prilocaine (EMLA) cream Apply 1 application topically as needed. 30 g 1  . loratadine (CLARITIN) 10 MG tablet Take 1 tablet (  10 mg total) by mouth daily. 90 tablet 0  . LORazepam (ATIVAN) 0.5 MG tablet Take 1 tablet (0.5 mg total) by mouth at bedtime as needed (Nausea or vomiting). 20 tablet 0  . Multiple Vitamin (MULTIVITAMIN) tablet Take 1 tablet by mouth daily.    . NONFORMULARY OR COMPOUNDED ITEM Inject 5 mg into the muscle every 7 (seven) days. Testosterone cypionate in olive  oil (25 mg/mL).  Dispense 5 mL vial. (Patient not taking: Reported on 08/29/2020) 1 each 3  . oxyCODONE (OXY IR/ROXICODONE) 5 MG immediate release tablet Take 0.5-1 tablets (2.5-5 mg total) by mouth every 6 (six) hours  as needed for severe pain. 5 tablet 0  . prochlorperazine (COMPAZINE) 10 MG tablet Take 1 tablet (10 mg total) by mouth every 6 (six) hours as needed (Nausea or vomiting). 30 tablet 1   No current facility-administered medications for this visit.    OBJECTIVE: White woman who appears well  Vitals:   09/18/20 0839  BP: (!) 126/47  Pulse: 86  Resp: 16  Temp: 97.9 F (36.6 C)  SpO2: 100%     Body mass index is 21.12 kg/m.   Wt Readings from Last 3 Encounters:  09/18/20 138 lb 14.4 oz (63 kg)  09/17/20 137 lb 12.6 oz (62.5 kg)  08/29/20 139 lb 6.4 oz (63.2 kg)      ECOG FS:1 - Symptomatic but completely ambulatory  Sclerae unicteric, EOMs intact Wearing a mask No cervical or supraclavicular adenopathy Lungs no rales or rhonchi Heart regular rate and rhythm Abd soft, nontender, positive bowel sounds MSK no focal spinal tenderness, no upper extremity lymphedema Neuro: nonfocal, well oriented, appropriate affect Breasts: The mass in the right breast is easily palpable at 10:00.  It is movable.  There is no skin or nipple involvement.  I do not palpate axillary adenopathy.   LAB RESULTS:  CMP     Component Value Date/Time   NA 141 08/29/2020 0820   K 3.9 08/29/2020 0820   CL 107 08/29/2020 0820   CO2 26 08/29/2020 0820   GLUCOSE 84 08/29/2020 0820   BUN 13 08/29/2020 0820   CREATININE 0.87 08/29/2020 0820   CREATININE 0.76 12/20/2013 1207   CALCIUM 9.1 08/29/2020 0820   PROT 7.2 08/29/2020 0820   ALBUMIN 4.0 08/29/2020 0820   AST 12 (L) 08/29/2020 0820   ALT 12 08/29/2020 0820   ALKPHOS 68 08/29/2020 0820   BILITOT 0.6 08/29/2020 0820   GFRNONAA >60 08/29/2020 0820    No results found for: TOTALPROTELP, ALBUMINELP, A1GS, A2GS, BETS, BETA2SER, GAMS, MSPIKE, SPEI  Lab Results  Component Value Date   WBC 9.6 09/18/2020   NEUTROABS 7.7 09/18/2020   HGB 11.0 (L) 09/18/2020   HCT 33.0 (L) 09/18/2020   MCV 94.3 09/18/2020   PLT 228 09/18/2020    No results  found for: LABCA2  No components found for: MVEHMC947  No results for input(s): INR in the last 168 hours.  No results found for: LABCA2  No results found for: SJG283  No results found for: MOQ947  No results found for: MLY650  No results found for: CA2729  No components found for: HGQUANT  No results found for: CEA1 / No results found for: CEA1   No results found for: AFPTUMOR  No results found for: CHROMOGRNA  No results found for: KPAFRELGTCHN, LAMBDASER, KAPLAMBRATIO (kappa/lambda light chains)  No results found for: HGBA, HGBA2QUANT, HGBFQUANT, HGBSQUAN (Hemoglobinopathy evaluation)   No results found for: LDH  No results  found for: IRON, TIBC, IRONPCTSAT (Iron and TIBC)  No results found for: FERRITIN  Urinalysis No results found for: COLORURINE, APPEARANCEUR, LABSPEC, PHURINE, GLUCOSEU, HGBUR, BILIRUBINUR, KETONESUR, PROTEINUR, UROBILINOGEN, NITRITE, LEUKOCYTESUR   STUDIES: CT Chest W Contrast  Result Date: 09/14/2020 CLINICAL DATA:  Breast cancer staging in a 60 year old female. RIGHT-sided breast cancer by report EXAM: CT CHEST WITH CONTRAST TECHNIQUE: Multidetector CT imaging of the chest was performed during intravenous contrast administration. CONTRAST:  82m OMNIPAQUE IOHEXOL 300 MG/ML  SOLN COMPARISON:  Bone scan evaluation of the same date. FINDINGS: Cardiovascular: Normal caliber thoracic aorta. Normal heart size without substantial pericardial effusion. Central pulmonary vasculature normal caliber. Mediastinum/Nodes: Thoracic inlet structures are normal. No axillary lymphadenopathy. Mildly prominent lymph nodes are seen bilaterally without pathologic enlargement, largest on the RIGHT approximately 7 mm on image 57 of series 2. Largest on the LEFT of similar size. Lymph nodes retain fatty hila. Is esophagus grossly normal. The no mediastinal adenopathy. No hilar adenopathy. Lungs/Pleura: Lungs are clear.  Airways are patent. Upper Abdomen: Imaged portions  of liver, gallbladder, pancreas, adrenal glands and kidneys are unremarkable. Wedge-shaped area of hypoattenuation in the spleen with signs of marginal calcification along the internal margin of this area of hypodensity seen in the spleen with no adjacent stranding. Signs of a splenule adjacent to the splenic hilum. No acute gastrointestinal process on limited evaluation of the upper abdomen. Musculoskeletal: Bilateral breast implants are in place. Soft tissue thickening with biopsy marker in place overlying the RIGHT lateral breast likely the site of tumor. Small lymph node with biopsy marker in place is the node measured above in the RIGHT axilla. No suspicious bone lesion or destructive bone findings. IMPRESSION: 1. Soft tissue thickening with biopsy marker in place overlying the RIGHT lateral breast likely the site of tumor. Small lymph node with biopsy marker in place is the node measured above in the RIGHT axilla. No signs of nodal enlargement with pathologic features by CT criteria. 2. 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 Correlate with any upper abdominal symptoms. There are no priors for comparison area measures approximately 1.7 x 1.4 cm in greatest axial dimension. These results will be called to the ordering clinician or representative by the Radiologist Assistant, and communication documented in the PACS or [employer] Electronically Signed   By: GZetta BillsM.D.   On: 09/14/2020 15:15   NM Bone Scan Whole Body  Result Date: 09/16/2020 CLINICAL DATA:  History of breast cancer. EXAM: NUCLEAR MEDICINE WHOLE BODY BONE SCAN TECHNIQUE: Whole body anterior and posterior images were obtained approximately 3 hours after intravenous injection of radiopharmaceutical. RADIOPHARMACEUTICALS:  19.8 mCi Technetium-957mDP IV  COMPARISON:  CT scan of same day. FINDINGS: Minimal uptake is seen involving both knees consistent with degenerative change. No other definite areas of abnormal uptake are noted. IMPRESSION: No definite scintigraphic evidence of osseous metastases. Electronically Signed   By: JaMarijo Conception.D.   On: 09/16/2020 12:30   MR BREAST BILATERAL W WO CONTRAST INC CAD  Result Date: 09/05/2020 CLINICAL DATA:  Recently diagnosed invasive mammary carcinoma in the 9 o'clock position of the right breast as well as a metastatic right axillary lymph node. There were 3 abnormal appearing right axillary lymph nodes at ultrasound. LABS:  None obtained on site today. EXAM: BILATERAL BREAST MRI WITH AND WITHOUT CONTRAST TECHNIQUE: Multiplanar, multisequence  MR images of both breasts were obtained prior to and following the intravenous administration of 6 ml of Gadavist Three-dimensional MR images were rendered by post-processing of the original MR data on an independent workstation. The three-dimensional MR images were interpreted, and findings are reported in the following complete MRI report for this study. Three dimensional images were evaluated at the independent interpreting workstation using the DynaCAD thin client. COMPARISON:  Recent mammogram, ultrasound and biopsy examinations at [facility] mammography. FINDINGS: Breast composition: c. Heterogeneous fibroglandular tissue. Background parenchymal enhancement: Mild. Right breast: Plaque-like enhancing mass containing a biopsy marker clip artifact in the 9 o'clock position of the right breast, middle depth. On image number 60 series 11, this measures 2.6 x 1.1 cm in maximum dimensions and in the sagittal plane measures 2.0 cm in cephalocaudal dimension. This has a mixture of enhancement kinetics, including rapid wash-in/washout. This corresponds to the recently biopsied malignancy. This is abutting the adjacent pectoralis major muscle with probable invasion of the muscle. More  inferiorly in the lateral aspect of the right breast, in the lower outer quadrant, there are 2 adjacent crescent-shaped, circumscribed, enhancing masses in the mid to posterior aspect of the breast with rapid wash-in/washout kinetics. These have appearances suggesting mildly enlarged intramammary lymph nodes. Intact retropectoral implant. Left breast: No mass or abnormal enhancement. Intact retropectoral implant. Lymph nodes: 2 adjacent prominent intramammary lymph nodes in the lower outer quadrant of the right breast, suspicious for metastatic nodes. There are also 4 right axillary lymph nodes with mild cortical thickening, suspicious for metastatic nodes, including the recently biopsied node containing a biopsy marker clip artifact. Ancillary findings:  None. IMPRESSION: 1. 2.6 x 2.0 x 1.1 cm biopsy-proven invasive mammary carcinoma in the 9 o'clock position of the right breast with probable invasion of the adjacent pectoralis major muscle. 2. 2 adjacent possible metastatic intramammary lymph nodes in lower outer quadrant of the right breast. 3. 4 right axillary lymph nodes suspicious for metastatic nodes, including the recently biopsied metastatic node. 4. No evidence of malignancy on the left. RECOMMENDATION: Treatment plan. BI-RADS CATEGORY  6: Known biopsy-proven malignancy. Electronically Signed   By: Claudie Revering M.D.   On: 09/05/2020 16:17   DG CHEST PORT 1 VIEW  Result Date: 09/17/2020 CLINICAL DATA:  Postop Port-A-Cath EXAM: PORTABLE CHEST 1 VIEW COMPARISON:  06/21/2008 CT chest 09/14/2020 FINDINGS: Left-sided central venous port tip over the SVC. No pneumothorax is visualized. There is no focal airspace disease or effusion. The cardiac size is normal IMPRESSION: Left-sided central venous port tip over the SVC. Lungs clear. No visible pneumothorax. Electronically Signed   By: Donavan Foil M.D.   On: 09/17/2020 18:55   DG Fluoro Guide CV Line-No Report  Result Date: 09/17/2020 Fluoroscopy was  utilized by the requesting physician.  No radiographic interpretation.   ECHOCARDIOGRAM COMPLETE  Result Date: 09/11/2020    ECHOCARDIOGRAM REPORT   Patient Name:   Melissa Mcgrath Date of Exam: 09/11/2020 Medical Rec #:  [id number]        Height:       68.0 in Accession #:    [id number]       Weight:       135.0 lb Date of Birth:  Nov 05, 1960        BSA:          1.729 m Patient Age:    74 years         BP:           116/58 mmHg  Patient Gender: F                HR:           60 bpm. Exam Location:  Inpatient Procedure: 2D Echo Indications:    Chemo Z09  History:        Patient has no prior history of Echocardiogram examinations.  Sonographer:    Mikki Santee RDCS (AE) Referring Phys: [place]  1. Left ventricular ejection fraction, by estimation, is 60 to 65%. The left ventricle has normal function. The left ventricle has no regional wall motion abnormalities. Left ventricular diastolic parameters are consistent with Grade I diastolic dysfunction (impaired relaxation). The average left ventricular global longitudinal strain is -19.5 %. The global longitudinal strain is normal.  2. Right ventricular systolic function is normal. The right ventricular size is normal. There is normal pulmonary artery systolic pressure.  3. The mitral valve is normal in structure. No evidence of mitral valve regurgitation. No evidence of mitral stenosis.  4. The aortic valve is tricuspid. Aortic valve regurgitation is not visualized. No aortic stenosis is present.  5. The inferior vena cava is normal in size with greater than 50% respiratory variability, suggesting right atrial pressure of 3 mmHg. FINDINGS  Left Ventricle: Left ventricular ejection fraction, by estimation, is 60 to 65%. The left ventricle has normal function. The left ventricle has no regional wall motion abnormalities. The average left ventricular global longitudinal strain is -19.5 %. The global longitudinal strain is normal. The left  ventricular internal cavity size was normal in size. There is no left ventricular hypertrophy. Left ventricular diastolic parameters are consistent with Grade I diastolic dysfunction (impaired relaxation). Normal left ventricular filling pressure. Right Ventricle: The right ventricular size is normal. No increase in right ventricular wall thickness. Right ventricular systolic function is normal. There is normal pulmonary artery systolic pressure. The tricuspid regurgitant velocity is 1.69 m/s, and  with an assumed right atrial pressure of 3 mmHg, the estimated right ventricular systolic pressure is 54.6 mmHg. Left Atrium: Left atrial size was normal in size. Right Atrium: Right atrial size was normal in size. Pericardium: There is no evidence of pericardial effusion. Mitral Valve: The mitral valve is normal in structure. No evidence of mitral valve regurgitation. No evidence of mitral valve stenosis. Tricuspid Valve: The tricuspid valve is normal in structure. Tricuspid valve regurgitation is trivial. No evidence of tricuspid stenosis. Aortic Valve: The aortic valve is tricuspid. Aortic valve regurgitation is not visualized. No aortic stenosis is present. Pulmonic Valve: The pulmonic valve was normal in structure. Pulmonic valve regurgitation is not visualized. No evidence of pulmonic stenosis. Aorta: The aortic root is normal in size and structure. Venous: The inferior vena cava is normal in size with greater than 50% respiratory variability, suggesting right atrial pressure of 3 mmHg. IAS/Shunts: No atrial level shunt detected by color flow Doppler.  LEFT VENTRICLE PLAX 2D LVIDd:         4.60 cm  Diastology LVIDs:         2.80 cm  LV e' medial:    8.81 cm/s LV PW:         0.80 cm  LV E/e' medial:  5.8 LV IVS:        0.70 cm  LV e' lateral:   12.80 cm/s LVOT diam:     2.10 cm  LV E/e' lateral: 4.0 LV SV:         68 LV SV Index:  48       2D Longitudinal Strain LVOT Area:     3.46 cm 2D Strain GLS (A2C):   -18.8 %                          2D Strain GLS (A3C):   -21.0 %                         2D Strain GLS (A4C):   -18.8 %                         2D Strain GLS Avg:     -19.5 % RIGHT VENTRICLE RV S prime:     12.60 cm/s TAPSE (M-mode): 2.3 cm LEFT ATRIUM             Index       RIGHT ATRIUM           Index LA diam:        2.70 cm 1.56 cm/m  RA Area:     14.00 cm LA Vol (A2C):   28.9 ml 16.71 ml/m RA Volume:   36.10 ml  20.87 ml/m LA Vol (A4C):   30.6 ml 17.69 ml/m LA Biplane Vol: 29.7 ml 17.17 ml/m  AORTIC VALVE LVOT Vmax:   90.20 cm/s LVOT Vmean:  56.100 cm/s LVOT VTI:    0.197 m  AORTA Ao Root diam: 2.80 cm MITRAL VALVE               TRICUSPID VALVE MV Area (PHT): 2.07 cm    TR Peak grad:   11.4 mmHg MV Decel Time: 366 msec    TR Vmax:        169.00 cm/s MV E velocity: 51.40 cm/s MV A velocity: 66.80 cm/s  SHUNTS MV E/A ratio:  0.77        Systemic VTI:  0.20 m                            Systemic Diam: 2.10 cm Skeet Latch MD Electronically signed by Skeet Latch MD Signature Date/Time: 09/11/2020/12:22:18 PM    Final      ELIGIBLE FOR AVAILABLE RESEARCH PROTOCOL: no  ASSESSMENT: 60 y.o. [place] woman status post right breast upper outer quadrant biopsy 08/21/2020 for a clinical mT2 N1, stage IIA/B invasive ductal carcinoma, grade 3, estrogen receptor positive, progesterone receptor weakly positive, HER-2 not amplified, with an MIB-1 of 40%  (a) breast MRI 09/05/2020 shows a clinical T2 N2, stage IIIA disease  (b) bone scan and chest CT scans 09/14/2020 show no evidence of metastatic disease  (1) genetics testing results pending  (2) neoadjuvant chemotherapy will consist of cyclophosphamide and doxorubicin in dose dense fashion x4 starting 09/18/2020, to be followed by weekly paclitaxel x12  (a) echo 09/11/2020 shows an ejection fraction in the 60-65% range  (3) definitive surgery to follow  (4) adjuvant radiation  (5) adjuvant antiestrogens   PLAN: Melissa Mcgrath is ready to start her  chemotherapy today and I reviewed her scans with her.  I also showed her the images of the MRI.  She has an excellent ejection fraction.  She had no problems from the port.  She has her supportive medications on hand and knows how to take them.  She understands she may have some bone discomfort from the growth factor shot 2 days from now  and I suggested she try Aleve and Tylenol together up to 3 times a day as needed for that.  She has not tolerated Tylenol well in the past she says.  We reviewed the finding in the spleen which she understands is old and she tells me she did get thrown by one of her horses about 3 years ago and possibly it could have been at that which injured the spleen.  That is of no consequence at this point  I am going to see her again in a week just to make sure she tolerated treatment well and to check her nadir counts.  She is going to bring me a symptom diary so we can troubleshoot problems for the subsequent cycles  Total encounter time 30 minutes.Sarajane Jews C. Magrinat, MD 09/18/2020 8:53 AM Medical Oncology and Hematology [facility] [place], [place] [address] Tel. [phone]    Fax. [phone]   This document serves as a record of services personally performed by Lurline Del, MD. It was created on his behalf by [name], a trained medical scribe. The creation of this record is based on the scribe's personal observations and the provider's statements to them.   I, Lurline Del MD, have reviewed the above documentation for accuracy and completeness, and I agree with the above.   *Total Encounter Time as defined by the Centers for Medicare and Medicaid Services includes, in addition to the face-to-face time of a patient visit (documented in the note above) non-face-to-face time: obtaining and reviewing outside history, ordering and reviewing medications, tests or procedures, care coordination (communications with  other health care professionals or caregivers) and documentation in the medical record.

## 2020-09-18 ENCOUNTER — Other Ambulatory Visit: Payer: Self-pay | Admitting: *Deleted

## 2020-09-18 ENCOUNTER — Encounter: Payer: Self-pay | Admitting: *Deleted

## 2020-09-18 ENCOUNTER — Inpatient Hospital Stay (HOSPITAL_BASED_OUTPATIENT_CLINIC_OR_DEPARTMENT_OTHER): Payer: No Typology Code available for payment source | Admitting: Oncology

## 2020-09-18 ENCOUNTER — Inpatient Hospital Stay: Payer: No Typology Code available for payment source

## 2020-09-18 ENCOUNTER — Encounter (HOSPITAL_BASED_OUTPATIENT_CLINIC_OR_DEPARTMENT_OTHER): Payer: Self-pay | Admitting: General Surgery

## 2020-09-18 VITALS — BP 126/47 | HR 86 | Temp 97.9°F | Resp 16 | Ht 68.0 in | Wt 138.9 lb

## 2020-09-18 DIAGNOSIS — C50411 Malignant neoplasm of upper-outer quadrant of right female breast: Secondary | ICD-10-CM

## 2020-09-18 DIAGNOSIS — Z170 Estrogen receptor positive status [ER+]: Secondary | ICD-10-CM

## 2020-09-18 DIAGNOSIS — Z5111 Encounter for antineoplastic chemotherapy: Secondary | ICD-10-CM | POA: Diagnosis not present

## 2020-09-18 DIAGNOSIS — Z1379 Encounter for other screening for genetic and chromosomal anomalies: Secondary | ICD-10-CM | POA: Insufficient documentation

## 2020-09-18 DIAGNOSIS — Z95828 Presence of other vascular implants and grafts: Secondary | ICD-10-CM

## 2020-09-18 LAB — COMPREHENSIVE METABOLIC PANEL
ALT: 11 U/L (ref 0–44)
AST: 14 U/L — ABNORMAL LOW (ref 15–41)
Albumin: 3.8 g/dL (ref 3.5–5.0)
Alkaline Phosphatase: 57 U/L (ref 38–126)
Anion gap: 12 (ref 5–15)
BUN: 12 mg/dL (ref 6–20)
CO2: 24 mmol/L (ref 22–32)
Calcium: 9.3 mg/dL (ref 8.9–10.3)
Chloride: 106 mmol/L (ref 98–111)
Creatinine, Ser: 0.82 mg/dL (ref 0.44–1.00)
GFR, Estimated: >60 mL/min (ref >60)
Glucose, Bld: 135 mg/dL — ABNORMAL HIGH (ref 70–99)
Potassium: 4.1 mmol/L (ref 3.5–5.1)
Sodium: 142 mmol/L (ref 135–145)
Total Bilirubin: 0.5 mg/dL (ref 0.3–1.2)
Total Protein: 6.9 g/dL (ref 6.5–8.1)

## 2020-09-18 LAB — CBC WITH DIFFERENTIAL/PLATELET
Abs Immature Granulocytes: 0.02 10*3/uL (ref 0.00–0.07)
Basophils Absolute: 0 10*3/uL (ref 0.0–0.1)
Basophils Relative: 0 %
Eosinophils Absolute: 0 10*3/uL (ref 0.0–0.5)
Eosinophils Relative: 0 %
HCT: 33 % — ABNORMAL LOW (ref 36.0–46.0)
Hemoglobin: 11 g/dL — ABNORMAL LOW (ref 12.0–15.0)
Immature Granulocytes: 0 %
Lymphocytes Relative: 13 %
Lymphs Abs: 1.2 10*3/uL (ref 0.7–4.0)
MCH: 31.4 pg (ref 26.0–34.0)
MCHC: 33.3 g/dL (ref 30.0–36.0)
MCV: 94.3 fL (ref 80.0–100.0)
Monocytes Absolute: 0.6 10*3/uL (ref 0.1–1.0)
Monocytes Relative: 7 %
Neutro Abs: 7.7 10*3/uL (ref 1.7–7.7)
Neutrophils Relative %: 80 %
Platelets: 228 10*3/uL (ref 150–400)
RBC: 3.5 MIL/uL — ABNORMAL LOW (ref 3.87–5.11)
RDW: 12 % (ref 11.5–15.5)
WBC: 9.6 10*3/uL (ref 4.0–10.5)
nRBC: 0 % (ref 0.0–0.2)

## 2020-09-18 MED ORDER — HEPARIN SOD (PORK) LOCK FLUSH 100 UNIT/ML IV SOLN
500.0000 [IU] | Freq: Once | INTRAVENOUS | Status: AC | PRN
  Administered 2020-09-18: 500 [IU]
  Filled 2020-09-18: qty 5

## 2020-09-18 MED ORDER — SODIUM CHLORIDE 0.9 % IV SOLN
10.0000 mg | Freq: Once | INTRAVENOUS | Status: AC
  Administered 2020-09-18: 10 mg via INTRAVENOUS
  Filled 2020-09-18: qty 1

## 2020-09-18 MED ORDER — PALONOSETRON HCL INJECTION 0.25 MG/5ML
INTRAVENOUS | Status: AC
  Filled 2020-09-18: qty 5

## 2020-09-18 MED ORDER — SODIUM CHLORIDE 0.9 % IV SOLN
600.0000 mg/m2 | Freq: Once | INTRAVENOUS | Status: AC
  Administered 2020-09-18: 1040 mg via INTRAVENOUS
  Filled 2020-09-18: qty 52

## 2020-09-18 MED ORDER — SODIUM CHLORIDE 0.9% FLUSH
10.0000 mL | INTRAVENOUS | Status: DC | PRN
  Administered 2020-09-18: 10 mL via INTRAVENOUS
  Filled 2020-09-18: qty 10

## 2020-09-18 MED ORDER — SODIUM CHLORIDE 0.9% FLUSH
10.0000 mL | INTRAVENOUS | Status: DC | PRN
  Administered 2020-09-18: 10 mL
  Filled 2020-09-18: qty 10

## 2020-09-18 MED ORDER — DOXORUBICIN HCL CHEMO IV INJECTION 2 MG/ML
60.0000 mg/m2 | Freq: Once | INTRAVENOUS | Status: AC
  Administered 2020-09-18: 104 mg via INTRAVENOUS
  Filled 2020-09-18: qty 52

## 2020-09-18 MED ORDER — SODIUM CHLORIDE 0.9 % IV SOLN
Freq: Once | INTRAVENOUS | Status: AC
  Filled 2020-09-18: qty 250

## 2020-09-18 MED ORDER — PALONOSETRON HCL INJECTION 0.25 MG/5ML
0.2500 mg | Freq: Once | INTRAVENOUS | Status: AC
  Administered 2020-09-18: 0.25 mg via INTRAVENOUS

## 2020-09-18 MED ORDER — SODIUM CHLORIDE 0.9 % IV SOLN
150.0000 mg | Freq: Once | INTRAVENOUS | Status: AC
  Administered 2020-09-18: 150 mg via INTRAVENOUS
  Filled 2020-09-18: qty 5

## 2020-09-18 NOTE — Patient Instructions (Signed)
[facility] Discharge Instructions for Patients Receiving Chemotherapy  Today you received the following chemotherapy agents Adriamycin and Cytoxan  To help prevent nausea and vomiting after your treatment, we encourage you to take your nausea medication, Compazine, as directed.   If you develop nausea and vomiting that is not controlled by your nausea medication, call the clinic.   BELOW ARE SYMPTOMS THAT SHOULD BE REPORTED IMMEDIATELY:  *FEVER GREATER THAN 100.5 F  *CHILLS WITH OR WITHOUT FEVER  NAUSEA AND VOMITING THAT IS NOT CONTROLLED WITH YOUR NAUSEA MEDICATION  *UNUSUAL SHORTNESS OF BREATH  *UNUSUAL BRUISING OR BLEEDING  TENDERNESS IN MOUTH AND THROAT WITH OR WITHOUT PRESENCE OF ULCERS  *URINARY PROBLEMS  *BOWEL PROBLEMS  UNUSUAL RASH Items with * indicate a potential emergency and should be followed up as soon as possible.  Feel free to call the clinic should you have any questions or concerns. The clinic phone number is [phone].  Please show the [place] at check-in to the Emergency Department and triage nurse.   Doxorubicin injection What is this medicine? DOXORUBICIN (dox oh ROO bi sin) is a chemotherapy drug. It is used to treat many kinds of cancer like leukemia, lymphoma, neuroblastoma, sarcoma, and Wilms' tumor. It is also used to treat bladder cancer, breast cancer, lung cancer, ovarian cancer, stomach cancer, and thyroid cancer. This medicine may be used for other purposes; ask your health care provider or pharmacist if you have questions. COMMON BRAND NAME(S): Adriamycin, Adriamycin PFS, Adriamycin RDF, Rubex What should I tell my health care provider before I take this medicine? They need to know if you have any of these conditions:  heart disease  history of low blood counts caused by a medicine  liver disease  recent or ongoing radiation therapy  an unusual or allergic reaction to doxorubicin, other chemotherapy  agents, other medicines, foods, dyes, or preservatives  pregnant or trying to get pregnant  breast-feeding How should I use this medicine? This drug is given as an infusion into a vein. It is administered in a hospital or clinic by a specially trained health care professional. If you have pain, swelling, burning or any unusual feeling around the site of your injection, tell your health care professional right away. Talk to your pediatrician regarding the use of this medicine in children. Special care may be needed. Overdosage: If you think you have taken too much of this medicine contact a poison control center or emergency room at once. NOTE: This medicine is only for you. Do not share this medicine with others. What if I miss a dose? It is important not to miss your dose. Call your doctor or health care professional if you are unable to keep an appointment. What may interact with this medicine? This medicine may interact with the following medications:  6-mercaptopurine  paclitaxel  phenytoin  St. John's Wort  trastuzumab  verapamil This list may not describe all possible interactions. Give your health care provider a list of all the medicines, herbs, non-prescription drugs, or dietary supplements you use. Also tell them if you smoke, drink alcohol, or use illegal drugs. Some items may interact with your medicine. What should I watch for while using this medicine? This drug may make you feel generally unwell. This is not uncommon, as chemotherapy can affect healthy cells as well as cancer cells. Report any side effects. Continue your course of treatment even though you feel ill unless your doctor tells you to stop. There is a  maximum amount of this medicine you should receive throughout your life. The amount depends on the medical condition being treated and your overall health. Your doctor will watch how much of this medicine you receive in your lifetime. Tell your doctor if you have  taken this medicine before. You may need blood work done while you are taking this medicine. Your urine may turn red for a few days after your dose. This is not blood. If your urine is dark or brown, call your doctor. In some cases, you may be given additional medicines to help with side effects. Follow all directions for their use. Call your doctor or health care professional for advice if you get a fever, chills or sore throat, or other symptoms of a cold or flu. Do not treat yourself. This drug decreases your body's ability to fight infections. Try to avoid being around people who are sick. This medicine may increase your risk to bruise or bleed. Call your doctor or health care professional if you notice any unusual bleeding. Talk to your doctor about your risk of cancer. You may be more at risk for certain types of cancers if you take this medicine. Do not become pregnant while taking this medicine or for 6 months after stopping it. Women should inform their doctor if they wish to become pregnant or think they might be pregnant. Men should not father a child while taking this medicine and for 6 months after stopping it. There is a potential for serious side effects to an unborn child. Talk to your health care professional or pharmacist for more information. Do not breast-feed an infant while taking this medicine. This medicine has caused ovarian failure in some women and reduced sperm counts in some men This medicine may interfere with the ability to have a child. Talk with your doctor or health care professional if you are concerned about your fertility. This medicine may cause a decrease in Co-Enzyme Q-10. You should make sure that you get enough Co-Enzyme Q-10 while you are taking this medicine. Discuss the foods you eat and the vitamins you take with your health care professional. What side effects may I notice from receiving this medicine? Side effects that you should report to your doctor or  health care professional as soon as possible:  allergic reactions like skin rash, itching or hives, swelling of the face, lips, or tongue  breathing problems  chest pain  fast or irregular heartbeat  low blood counts - this medicine may decrease the number of white blood cells, red blood cells and platelets. You may be at increased risk for infections and bleeding.  pain, redness, or irritation at site where injected  signs of infection - fever or chills, cough, sore throat, pain or difficulty passing urine  signs of decreased platelets or bleeding - bruising, pinpoint red spots on the skin, black, tarry stools, blood in the urine  swelling of the ankles, feet, hands  tiredness  weakness Side effects that usually do not require medical attention (report to your doctor or health care professional if they continue or are bothersome):  diarrhea  hair loss  mouth sores  nail discoloration or damage  nausea  red colored urine  vomiting This list may not describe all possible side effects. Call your doctor for medical advice about side effects. You may report side effects to FDA at 1-800-FDA-1088. Where should I keep my medicine? This drug is given in a hospital or clinic and will not be stored  at home. NOTE: This sheet is a summary. It may not cover all possible information. If you have questions about this medicine, talk to your doctor, pharmacist, or health care provider.  2021 Elsevier/Gold Standard (2017-01-21 11:01:26)   Cyclophosphamide Injection What is this medicine? CYCLOPHOSPHAMIDE (sye kloe FOSS fa mide) is a chemotherapy drug. It slows the growth of cancer cells. This medicine is used to treat many types of cancer like lymphoma, myeloma, leukemia, breast cancer, and ovarian cancer, to name a few. This medicine may be used for other purposes; ask your health care provider or pharmacist if you have questions. COMMON BRAND NAME(S): Cytoxan, Neosar What should I  tell my health care provider before I take this medicine? They need to know if you have any of these conditions:  heart disease  history of irregular heartbeat  infection  kidney disease  liver disease  low blood counts, like white cells, platelets, or red blood cells  on hemodialysis  recent or ongoing radiation therapy  scarring or thickening of the lungs  trouble passing urine  an unusual or allergic reaction to cyclophosphamide, other medicines, foods, dyes, or preservatives  pregnant or trying to get pregnant  breast-feeding How should I use this medicine? This drug is usually given as an injection into a vein or muscle or by infusion into a vein. It is administered in a hospital or clinic by a specially trained health care professional. Talk to your pediatrician regarding the use of this medicine in children. Special care may be needed. Overdosage: If you think you have taken too much of this medicine contact a poison control center or emergency room at once. NOTE: This medicine is only for you. Do not share this medicine with others. What if I miss a dose? It is important not to miss your dose. Call your doctor or health care professional if you are unable to keep an appointment. What may interact with this medicine?  amphotericin B  azathioprine  certain antivirals for HIV or hepatitis  certain medicines for blood pressure, heart disease, irregular heart beat  certain medicines that treat or prevent blood clots like warfarin  certain other medicines for cancer  cyclosporine  etanercept  indomethacin  medicines that relax muscles for surgery  medicines to increase blood counts  metronidazole This list may not describe all possible interactions. Give your health care provider a list of all the medicines, herbs, non-prescription drugs, or dietary supplements you use. Also tell them if you smoke, drink alcohol, or use illegal drugs. Some items may  interact with your medicine. What should I watch for while using this medicine? Your condition will be monitored carefully while you are receiving this medicine. You may need blood work done while you are taking this medicine. Drink water or other fluids as directed. Urinate often, even at night. Some products may contain alcohol. Ask your health care professional if this medicine contains alcohol. Be sure to tell all health care professionals you are taking this medicine. Certain medicines, like metronidazole and disulfiram, can cause an unpleasant reaction when taken with alcohol. The reaction includes flushing, headache, nausea, vomiting, sweating, and increased thirst. The reaction can last from 30 minutes to several hours. Do not become pregnant while taking this medicine or for 1 year after stopping it. Women should inform their health care professional if they wish to become pregnant or think they might be pregnant. Men should not father a child while taking this medicine and for 4 months after stopping  it. There is potential for serious side effects to an unborn child. Talk to your health care professional for more information. Do not breast-feed an infant while taking this medicine or for 1 week after stopping it. This medicine has caused ovarian failure in some women. This medicine may make it more difficult to get pregnant. Talk to your health care professional if you are concerned about your fertility. This medicine has caused decreased sperm counts in some men. This may make it more difficult to father a child. Talk to your health care professional if you are concerned about your fertility. Call your health care professional for advice if you get a fever, chills, or sore throat, or other symptoms of a cold or flu. Do not treat yourself. This medicine decreases your body's ability to fight infections. Try to avoid being around people who are sick. Avoid taking medicines that contain aspirin,  acetaminophen, ibuprofen, naproxen, or ketoprofen unless instructed by your health care professional. These medicines may hide a fever. Talk to your health care professional about your risk of cancer. You may be more at risk for certain types of cancer if you take this medicine. If you are going to need surgery or other procedure, tell your health care professional that you are using this medicine. Be careful brushing or flossing your teeth or using a toothpick because you may get an infection or bleed more easily. If you have any dental work done, tell your dentist you are receiving this medicine. What side effects may I notice from receiving this medicine? Side effects that you should report to your doctor or health care professional as soon as possible:  allergic reactions like skin rash, itching or hives, swelling of the face, lips, or tongue  breathing problems  nausea, vomiting  signs and symptoms of bleeding such as bloody or black, tarry stools; red or dark brown urine; spitting up blood or brown material that looks like coffee grounds; red spots on the skin; unusual bruising or bleeding from the eyes, gums, or nose  signs and symptoms of heart failure like fast, irregular heartbeat, sudden weight gain; swelling of the ankles, feet, hands  signs and symptoms of infection like fever; chills; cough; sore throat; pain or trouble passing urine  signs and symptoms of kidney injury like trouble passing urine or change in the amount of urine  signs and symptoms of liver injury like dark yellow or brown urine; general ill feeling or flu-like symptoms; light-colored stools; loss of appetite; nausea; right upper belly pain; unusually weak or tired; yellowing of the eyes or skin Side effects that usually do not require medical attention (report to your doctor or health care professional if they continue or are bothersome):  confusion  decreased hearing  diarrhea  facial flushing  hair  loss  headache  loss of appetite  missed menstrual periods  signs and symptoms of low red blood cells or anemia such as unusually weak or tired; feeling faint or lightheaded; falls  skin discoloration This list may not describe all possible side effects. Call your doctor for medical advice about side effects. You may report side effects to FDA at 1-800-FDA-1088. Where should I keep my medicine? This drug is given in a hospital or clinic and will not be stored at home. NOTE: This sheet is a summary. It may not cover all possible information. If you have questions about this medicine, talk to your doctor, pharmacist, or health care provider.  2021 Elsevier/Gold Standard (2019-03-14 09:53:29)

## 2020-09-18 NOTE — Patient Instructions (Signed)
Implanted Port Insertion, Care After This sheet gives you information about how to care for yourself after your procedure. Your health care provider may also give you more specific instructions. If you have problems or questions, contact your health care provider. What can I expect after the procedure? After the procedure, it is common to have:  Discomfort at the port insertion site.  Bruising on the skin over the port. This should improve over 3-4 days. Follow these instructions at home: Port care  After your port is placed, you will get a manufacturer's information card. The card has information about your port. Keep this card with you at all times.  Take care of the port as told by your health care provider. Ask your health care provider if you or a family member can get training for taking care of the port at home. A home health care nurse may also take care of the port.  Make sure to remember what type of port you have. Incision care  Follow instructions from your health care provider about how to take care of your port insertion site. Make sure you: ? Wash your hands with soap and water before and after you change your bandage (dressing). If soap and water are not available, use hand sanitizer. ? Change your dressing as told by your health care provider. ? Leave stitches (sutures), skin glue, or adhesive strips in place. These skin closures may need to stay in place for 2 weeks or longer. If adhesive strip edges start to loosen and curl up, you may trim the loose edges. Do not remove adhesive strips completely unless your health care provider tells you to do that.  Check your port insertion site every day for signs of infection. Check for: ? Redness, swelling, or pain. ? Fluid or blood. ? Warmth. ? Pus or a bad smell.      Activity  Return to your normal activities as told by your health care provider. Ask your health care provider what activities are safe for you.  Do not  lift anything that is heavier than 10 lb (4.5 kg), or the limit that you are told, until your health care provider says that it is safe. General instructions  Take over-the-counter and prescription medicines only as told by your health care provider.  Do not take baths, swim, or use a hot tub until your health care provider approves. Ask your health care provider if you may take showers. You may only be allowed to take sponge baths.  Do not drive for 24 hours if you were given a sedative during your procedure.  Wear a medical alert bracelet in case of an emergency. This will tell any health care providers that you have a port.  Keep all follow-up visits as told by your health care provider. This is important. Contact a health care provider if:  You cannot flush your port with saline as directed, or you cannot draw blood from the port.  You have a fever or chills.  You have redness, swelling, or pain around your port insertion site.  You have fluid or blood coming from your port insertion site.  Your port insertion site feels warm to the touch.  You have pus or a bad smell coming from the port insertion site. Get help right away if:  You have chest pain or shortness of breath.  You have bleeding from your port that you cannot control. Summary  Take care of the port as told by your   health care provider. Keep the manufacturer's information card with you at all times.  Change your dressing as told by your health care provider.  Contact a health care provider if you have a fever or chills or if you have redness, swelling, or pain around your port insertion site.  Keep all follow-up visits as told by your health care provider. This information is not intended to replace advice given to you by your health care provider. Make sure you discuss any questions you have with your health care provider. Document Revised: 01/05/2018 Document Reviewed: 01/05/2018 Elsevier Patient Education   2021 Elsevier Inc.  

## 2020-09-18 NOTE — Anesthesia Postprocedure Evaluation (Signed)
Anesthesia Post Note  Patient: Melissa Mcgrath  Procedure(s) Performed: INSERTION PORT-A-CATH (N/A Breast)     Patient location during evaluation: PACU Anesthesia Type: General Level of consciousness: awake and alert Pain management: pain level controlled Vital Signs Assessment: post-procedure vital signs reviewed and stable Respiratory status: spontaneous breathing, nonlabored ventilation, respiratory function stable and patient connected to nasal cannula oxygen Cardiovascular status: blood pressure returned to baseline and stable Postop Assessment: no apparent nausea or vomiting Anesthetic complications: no   No complications documented.  Last Vitals:  Vitals:   09/17/20 1830 09/17/20 1900  BP: (!) 131/59 129/79  Pulse: 84 70  Resp: 16 16  Temp:  36.5 C  SpO2: 96% 100%    Last Pain:  Vitals:   09/17/20 1402  TempSrc: Oral                 Belenda Cruise P Aman Batley

## 2020-09-18 NOTE — Addendum Note (Signed)
Addended by: Chauncey Cruel on: 09/18/2020 09:36 AM   Modules accepted: Orders

## 2020-09-20 ENCOUNTER — Other Ambulatory Visit: Payer: Self-pay | Admitting: *Deleted

## 2020-09-20 ENCOUNTER — Inpatient Hospital Stay: Payer: No Typology Code available for payment source

## 2020-09-20 ENCOUNTER — Other Ambulatory Visit: Payer: Self-pay

## 2020-09-20 VITALS — BP 116/54 | HR 70 | Temp 98.2°F | Resp 18

## 2020-09-20 DIAGNOSIS — Z170 Estrogen receptor positive status [ER+]: Secondary | ICD-10-CM

## 2020-09-20 DIAGNOSIS — Z5111 Encounter for antineoplastic chemotherapy: Secondary | ICD-10-CM | POA: Diagnosis not present

## 2020-09-20 DIAGNOSIS — C50411 Malignant neoplasm of upper-outer quadrant of right female breast: Secondary | ICD-10-CM

## 2020-09-20 MED ORDER — PEGFILGRASTIM-BMEZ 6 MG/0.6ML ~~LOC~~ SOSY
PREFILLED_SYRINGE | SUBCUTANEOUS | Status: AC
  Filled 2020-09-20: qty 0.6

## 2020-09-20 MED ORDER — PEGFILGRASTIM-BMEZ 6 MG/0.6ML ~~LOC~~ SOSY
6.0000 mg | PREFILLED_SYRINGE | Freq: Once | SUBCUTANEOUS | Status: AC
  Administered 2020-09-20: 6 mg via SUBCUTANEOUS

## 2020-09-20 NOTE — Patient Instructions (Signed)
Pegfilgrastim injection What is this medicine? PEGFILGRASTIM (PEG fil gra stim) is a long-acting granulocyte colony-stimulating factor that stimulates the growth of neutrophils, a type of white blood cell important in the body's fight against infection. It is used to reduce the incidence of fever and infection in patients with certain types of cancer who are receiving chemotherapy that affects the bone marrow, and to increase survival after being exposed to high doses of radiation. This medicine may be used for other purposes; ask your health care provider or pharmacist if you have questions. COMMON BRAND NAME(S): Fulphila, Neulasta, Nyvepria, UDENYCA, Ziextenzo What should I tell my health care provider before I take this medicine? They need to know if you have any of these conditions:  kidney disease  latex allergy  ongoing radiation therapy  sickle cell disease  skin reactions to acrylic adhesives (On-Body Injector only)  an unusual or allergic reaction to pegfilgrastim, filgrastim, other medicines, foods, dyes, or preservatives  pregnant or trying to get pregnant  breast-feeding How should I use this medicine? This medicine is for injection under the skin. If you get this medicine at home, you will be taught how to prepare and give the pre-filled syringe or how to use the On-body Injector. Refer to the patient Instructions for Use for detailed instructions. Use exactly as directed. Tell your healthcare provider immediately if you suspect that the On-body Injector may not have performed as intended or if you suspect the use of the On-body Injector resulted in a missed or partial dose. It is important that you put your used needles and syringes in a special sharps container. Do not put them in a trash can. If you do not have a sharps container, call your pharmacist or healthcare provider to get one. Talk to your pediatrician regarding the use of this medicine in children. While this drug  may be prescribed for selected conditions, precautions do apply. Overdosage: If you think you have taken too much of this medicine contact a poison control center or emergency room at once. NOTE: This medicine is only for you. Do not share this medicine with others. What if I miss a dose? It is important not to miss your dose. Call your doctor or health care professional if you miss your dose. If you miss a dose due to an On-body Injector failure or leakage, a new dose should be administered as soon as possible using a single prefilled syringe for manual use. What may interact with this medicine? Interactions have not been studied. This list may not describe all possible interactions. Give your health care provider a list of all the medicines, herbs, non-prescription drugs, or dietary supplements you use. Also tell them if you smoke, drink alcohol, or use illegal drugs. Some items may interact with your medicine. What should I watch for while using this medicine? Your condition will be monitored carefully while you are receiving this medicine. You may need blood work done while you are taking this medicine. Talk to your health care provider about your risk of cancer. You may be more at risk for certain types of cancer if you take this medicine. If you are going to need a MRI, CT scan, or other procedure, tell your doctor that you are using this medicine (On-Body Injector only). What side effects may I notice from receiving this medicine? Side effects that you should report to your doctor or health care professional as soon as possible:  allergic reactions (skin rash, itching or hives, swelling of   the face, lips, or tongue)  back pain  dizziness  fever  pain, redness, or irritation at site where injected  pinpoint red spots on the skin  red or dark-brown urine  shortness of breath or breathing problems  stomach or side pain, or pain at the shoulder  swelling  tiredness  trouble  passing urine or change in the amount of urine  unusual bruising or bleeding Side effects that usually do not require medical attention (report to your doctor or health care professional if they continue or are bothersome):  bone pain  muscle pain This list may not describe all possible side effects. Call your doctor for medical advice about side effects. You may report side effects to FDA at 1-800-FDA-1088. Where should I keep my medicine? Keep out of the reach of children. If you are using this medicine at home, you will be instructed on how to store it. Throw away any unused medicine after the expiration date on the label. NOTE: This sheet is a summary. It may not cover all possible information. If you have questions about this medicine, talk to your doctor, pharmacist, or health care provider.  2021 [employer] (2019-07-01 13:20:51)  

## 2020-09-21 ENCOUNTER — Telehealth: Payer: Self-pay | Admitting: Genetic Counselor

## 2020-09-21 NOTE — Telephone Encounter (Signed)
LVM that her genetic test results are available and requested that she call back to discuss them.  

## 2020-09-22 ENCOUNTER — Other Ambulatory Visit (INDEPENDENT_AMBULATORY_CARE_PROVIDER_SITE_OTHER): Payer: Self-pay | Admitting: Internal Medicine

## 2020-09-23 NOTE — Progress Notes (Signed)
[place]  [phone] [phone]     ID: Melissa Mcgrath DOB: 1960-07-02  MR#: [id number]  NWG#:[id number]  Patient Care Team: Doree Albee, MD as PCP - General (Internal Medicine) Mauro Kaufmann, RN as Oncology Nurse Navigator Rockwell Germany, RN as Oncology Nurse Navigator Jaspreet Bodner, Virgie Dad, MD as Consulting Physician (Oncology) Stark Klein, MD as Consulting Physician (General Surgery) Gery Pray, MD as Consulting Physician (Radiation Oncology) Dian Queen, MD as Consulting Physician (Obstetrics and Gynecology) Ulla Gallo, MD as Consulting Physician (Dermatology) Chauncey Cruel, MD OTHER MD:  CHIEF COMPLAINT: Estrogen receptor positive breast cancer  CURRENT TREATMENT: Neoadjuvant chemotherapy   INTERVAL HISTORY: Melissa Mcgrath returns today for follow up and treatment of her estrogen receptor positive breast cancer.   She began neoadjuvant chemotherapy, consisting of cyclophosphamide and doxorubicin in dose dense fashion x4, at her last visit on 09/18/2020.  Today is day 7 cycle 1  Her genetic testing results are still pending   REVIEW OF SYSTEMS: Melissa Mcgrath did all right with the first couple of days of treatment.  She had her immune booster on day 3 and tolerated that without any immediate problems.  On day 5 she had some bony aches.  All she used for that was a heating pad.  She did not take any pain medicine.  By Sunday however ( day 6) she was having problems with nausea.  She took more Compazine and that made her sleepy and gave her strange dreams.  She also became constipated.  She developed a rash only on the right hip area.  She is not aware of why she would have had an exposure to that area and not elsewhere.  She is having some altered taste but taste is not too bad.  However she is not eating very much and she is drinking very little.  Detailed review of systems was otherwise stable  COVID 19 VACCINATION STATUS: Melissa Mcgrath  x2, most recently 01/2020, no booster as of 08/29/2020   HISTORY OF CURRENT ILLNESS: From the original intake note:  Melissa Mcgrath herself palpated an upper-outer right breast lump and noted associated intermittent pain and itching. She underwent bilateral diagnostic mammography with tomography and right breast ultrasonography at [facility] on 08/20/2020 showing: breast density category B; palpable 2.1 cm irregular mass in right breast at 9 o'clock; three abnormal-appearing right axillary tail nodes; indeterminate 4 mm oval mass located 1.8 cm lateral to dominant mass.  Accordingly on 08/21/2020 she proceeded to biopsy of the right breast area in question. The pathology from this procedure ([id number]) showed: invasive mammary carcinoma, e-cadherin positive, grade 3. Prognostic indicators significant for: estrogen receptor, 90% positive with strong staining intensity and progesterone receptor, 2% positive with weak staining intensity. Proliferation marker Ki67 at 40%. HER2 equivocal by immunohistochemistry (2+), but negative by fluorescent in situ hybridization (signals ratio and number per cell not available today).  The biopsied lymph node in the right axillary tail also showed invasive ductal carcinoma. No lymph node tissue was identified, possibly representing an entirely replaced lymph node.  Cancer Staging Malignant neoplasm of upper-outer quadrant of right breast in female, estrogen receptor positive ([place]) Staging form: Breast, AJCC 8th Edition - Clinical stage from 08/29/2020: Stage IIIA (cT2, cN2, cM0, G3, ER+, PR+, HER2-) - Signed by Gardenia Phlegm, NP on 09/07/2020 Stage prefix: Initial diagnosis Stage used in treatment planning: Yes National guidelines used in treatment planning: Yes Type of national guideline used in treatment planning: NCCN  The  patient's subsequent history is as detailed below.   PAST MEDICAL HISTORY: Past Medical History:  Diagnosis Date  . BV (bacterial  vaginosis) 10/27/2014  . Depression 01/05/2014  . Facial basal cell cancer   . Family history of breast cancer   . Family history of melanoma   . Family history of ovarian cancer   . Family history of pancreatic cancer   . Menopause 01/05/2014  . Vaginal atrophy 11/09/2014  . Vaginal itching 10/27/2014    PAST SURGICAL HISTORY: Past Surgical History:  Procedure Laterality Date  . BREAST ENHANCEMENT SURGERY    . CARPAL TUNNEL RELEASE Right   . PORTACATH PLACEMENT N/A 09/17/2020   Procedure: INSERTION PORT-A-CATH;  Surgeon: Stark Klein, MD;  Location: [facility];  Service: General;  Laterality: N/A;  . refractive lensectomy Bilateral     FAMILY HISTORY: Family History  Problem Relation Age of Onset  . Breast cancer Mother 16       breast  . Hypertension Father   . Heart attack Father   . Alzheimer's disease Father   . Fibromyalgia Sister   . Diabetes Sister   . Heart attack Sister   . Hypertension Sister   . Hypertension Brother   . Diabetes Brother   . Pancreatic cancer Maternal Grandmother 59       Pancreatic  . Melanoma Paternal Uncle        dx 38s  . Melanoma Paternal Uncle        dx 47s  . Ovarian cancer Paternal Aunt        dx 50s/60s  Her father died at age 16 from Alzheimer's. Her mother died at age 3 from metastatic breast cancer. She was initially diagnosed with breast cancer at age 99. Melissa Mcgrath has two brothers and one sister. In addition to her mother, she reports cancer in a paternal aunt (ovarian in mid 69's) and in her maternal grandmother (pancreatic at age 54).    GYNECOLOGIC HISTORY:  No LMP recorded. Patient is postmenopausal. Menarche: 60 years old Age at first live birth: 60 years old [place] P 2 LMP 2010 Contraceptive: used for approx. 20 years HRT used from 11/2019 until diagnosis  Hysterectomy? no BSO? 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 Husband Herbie Baltimore "Fritz Pickerel" is retired from working for [employer]. For  exercise, she walks daily, rides horses every other week, and does farm work daily. Daughter Cline Cools, age 37, is a horse trainer/instructor. Daughter Lavell Anchors, age 71, is a Copywriter, advertising in [place]. Sherrie has one grandchild. She attends [employer].    ADVANCED DIRECTIVES: In the absence of any documentation to the contrary, the patient's spouse is their HCPOA.    HEALTH MAINTENANCE: Social History   Tobacco Use  . Smoking status: Never Smoker  . Smokeless tobacco: Never Used  Vaping Use  . Vaping Use: Never used  Substance Use Topics  . Alcohol use: No  . Drug use: No     Colonoscopy: never done  PAP: 01/2020  Bone density: never done   No Known Allergies  Current Outpatient Medications  Medication Sig Dispense Refill  . dexamethasone (DECADRON) 4 MG tablet Take 2 tablets (8 mg total) by mouth daily. Take daily for 3 days after chemo. Take with food. 30 tablet 1  . lidocaine-prilocaine (EMLA) cream Apply 1 application topically as needed. 30 g 1  . loratadine (CLARITIN) 10 MG tablet Take 1 tablet (10 mg total) by mouth  daily. 90 tablet 0  . LORazepam (ATIVAN) 0.5 MG tablet Take 1 tablet (0.5 mg total) by mouth at bedtime as needed (Nausea or vomiting). 20 tablet 0  . Multiple Vitamin (MULTIVITAMIN) tablet Take 1 tablet by mouth daily.    Marland Kitchen oxyCODONE (OXY IR/ROXICODONE) 5 MG immediate release tablet Take 0.5-1 tablets (2.5-5 mg total) by mouth every 6 (six) hours as needed for severe pain. 5 tablet 0  . prochlorperazine (COMPAZINE) 10 MG tablet Take 1 tablet (10 mg total) by mouth every 6 (six) hours as needed (Nausea or vomiting). 30 tablet 1   No current facility-administered medications for this visit.    OBJECTIVE: White woman who appears well  There were no vitals filed for this visit.   There is no height or weight on file to calculate BMI.   Wt Readings from Last 3 Encounters:  09/18/20 138 lb 14.4 oz (63 kg)  09/17/20 137 lb 12.6 oz (62.5 kg)   08/29/20 139 lb 6.4 oz (63.2 kg)      ECOG FS:1 - Symptomatic but completely ambulatory  Sclerae unicteric, EOMs intact Wearing a mask No cervical or supraclavicular adenopathy Lungs no rales or rhonchi Heart regular rate and rhythm Abd soft, nontender, positive bowel sounds MSK no focal spinal tenderness, no upper extremity lymphedema Neuro: nonfocal, well oriented, appropriate affect Breasts: The mass in the right breast is harder to palpate and appears smaller.  Rash on right hip area 09/25/2018    LAB RESULTS:  CMP     Component Value Date/Time   NA 142 09/18/2020 0826   K 4.1 09/18/2020 0826   CL 106 09/18/2020 0826   CO2 24 09/18/2020 0826   GLUCOSE 135 (H) 09/18/2020 0826   BUN 12 09/18/2020 0826   CREATININE 0.82 09/18/2020 0826   CREATININE 0.87 08/29/2020 0820   CREATININE 0.76 12/20/2013 1207   CALCIUM 9.3 09/18/2020 0826   PROT 6.9 09/18/2020 0826   ALBUMIN 3.8 09/18/2020 0826   AST 14 (L) 09/18/2020 0826   AST 12 (L) 08/29/2020 0820   ALT 11 09/18/2020 0826   ALT 12 08/29/2020 0820   ALKPHOS 57 09/18/2020 0826   BILITOT 0.5 09/18/2020 0826   BILITOT 0.6 08/29/2020 0820   GFRNONAA >60 09/18/2020 0826   GFRNONAA >60 08/29/2020 0820    No results found for: TOTALPROTELP, ALBUMINELP, A1GS, A2GS, BETS, BETA2SER, GAMS, MSPIKE, SPEI  Lab Results  Component Value Date   WBC 9.6 09/18/2020   NEUTROABS 7.7 09/18/2020   HGB 11.0 (L) 09/18/2020   HCT 33.0 (L) 09/18/2020   MCV 94.3 09/18/2020   PLT 228 09/18/2020    No results found for: LABCA2  No components found for: [id number]  No results for input(s): INR in the last 168 hours.  No results found for: LABCA2  No results found for: QJJ941  No results found for: DEY814  No results found for: GYJ856  No results found for: CA2729  No components found for: HGQUANT  No results found for: CEA1 / No results found for: CEA1   No results found for: AFPTUMOR  No results found for:  CHROMOGRNA  No results found for: KPAFRELGTCHN, LAMBDASER, KAPLAMBRATIO (kappa/lambda light chains)  No results found for: HGBA, HGBA2QUANT, HGBFQUANT, HGBSQUAN (Hemoglobinopathy evaluation)   No results found for: LDH  No results found for: IRON, TIBC, IRONPCTSAT (Iron and TIBC)  No results found for: FERRITIN  Urinalysis No results found for: COLORURINE, APPEARANCEUR, LABSPEC, PHURINE, GLUCOSEU, HGBUR, BILIRUBINUR, KETONESUR, [place], [place], NITRITE, LEUKOCYTESUR  STUDIES: CT Chest W Contrast  Result Date: 09/14/2020 CLINICAL DATA:  Breast cancer staging in a 60 year old female. RIGHT-sided breast cancer by report EXAM: CT CHEST WITH CONTRAST TECHNIQUE: Multidetector CT imaging of the chest was performed during intravenous contrast administration. CONTRAST:  22m OMNIPAQUE IOHEXOL 300 MG/ML  SOLN COMPARISON:  Bone scan evaluation of the same date. FINDINGS: Cardiovascular: Normal caliber thoracic aorta. Normal heart size without substantial pericardial effusion. Central pulmonary vasculature normal caliber. Mediastinum/Nodes: Thoracic inlet structures are normal. No axillary lymphadenopathy. Mildly prominent lymph nodes are seen bilaterally without pathologic enlargement, largest on the RIGHT approximately 7 mm on image 57 of series 2. Largest on the LEFT of similar size. Lymph nodes retain fatty hila. Is esophagus grossly normal. The no mediastinal adenopathy. No hilar adenopathy. Lungs/Pleura: Lungs are clear.  Airways are patent. Upper Abdomen: Imaged portions of liver, gallbladder, pancreas, adrenal glands and kidneys are unremarkable. Wedge-shaped area of hypoattenuation in the spleen with signs of marginal calcification along the internal margin of this area of hypodensity seen in the spleen with no adjacent stranding. Signs of a splenule adjacent to the splenic hilum. No acute gastrointestinal process on limited evaluation of the upper abdomen. Musculoskeletal: Bilateral  breast implants are in place. Soft tissue thickening with biopsy marker in place overlying the RIGHT lateral breast likely the site of tumor. Small lymph node with biopsy marker in place is the node measured above in the RIGHT axilla. No suspicious bone lesion or destructive bone findings. IMPRESSION: 1. Soft tissue thickening with biopsy marker in place overlying the RIGHT lateral breast likely the site of tumor. Small lymph node with biopsy marker in place is the node measured above in the RIGHT axilla. No signs of nodal enlargement with pathologic features by CT criteria. 2. 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 Correlate with any upper abdominal symptoms. There are no priors for comparison area measures approximately 1.7 x 1.4 cm in greatest axial dimension. These results will be called to the ordering clinician or representative by the Radiologist Assistant, and communication documented in the PACS or [employer] Electronically Signed   By: GZetta BillsM.D.   On: 09/14/2020 15:15   NM Bone Scan Whole Body  Result Date: 09/16/2020 CLINICAL DATA:  History of breast cancer. EXAM: NUCLEAR MEDICINE WHOLE BODY BONE SCAN TECHNIQUE: Whole body anterior and posterior images were obtained approximately 3 hours after intravenous injection of radiopharmaceutical. RADIOPHARMACEUTICALS:  19.8 mCi Technetium-912mDP IV COMPARISON:  CT scan of same day. FINDINGS: Minimal uptake is seen involving both knees consistent with degenerative change. No other definite areas of abnormal uptake are noted. IMPRESSION: No definite scintigraphic evidence of osseous metastases. Electronically Signed   By: JaMarijo Conception.D.   On: 09/16/2020 12:30   MR BREAST BILATERAL W WO CONTRAST INC CAD  Result Date: 09/05/2020 CLINICAL DATA:  Recently  diagnosed invasive mammary carcinoma in the 9 o'clock position of the right breast as well as a metastatic right axillary lymph node. There were 3 abnormal appearing right axillary lymph nodes at ultrasound. LABS:  None obtained on site today. EXAM: BILATERAL BREAST MRI WITH AND WITHOUT CONTRAST TECHNIQUE: Multiplanar, multisequence MR images of both breasts were obtained prior to and following the intravenous administration of 6 ml of Gadavist Three-dimensional MR images were rendered by post-processing of the original MR data on an independent  workstation. The three-dimensional MR images were interpreted, and findings are reported in the following complete MRI report for this study. Three dimensional images were evaluated at the independent interpreting workstation using the DynaCAD thin client. COMPARISON:  Recent mammogram, ultrasound and biopsy examinations at [facility] mammography. FINDINGS: Breast composition: c. Heterogeneous fibroglandular tissue. Background parenchymal enhancement: Mild. Right breast: Plaque-like enhancing mass containing a biopsy marker clip artifact in the 9 o'clock position of the right breast, middle depth. On image number 60 series 11, this measures 2.6 x 1.1 cm in maximum dimensions and in the sagittal plane measures 2.0 cm in cephalocaudal dimension. This has a mixture of enhancement kinetics, including rapid wash-in/washout. This corresponds to the recently biopsied malignancy. This is abutting the adjacent pectoralis major muscle with probable invasion of the muscle. More inferiorly in the lateral aspect of the right breast, in the lower outer quadrant, there are 2 adjacent crescent-shaped, circumscribed, enhancing masses in the mid to posterior aspect of the breast with rapid wash-in/washout kinetics. These have appearances suggesting mildly enlarged intramammary lymph nodes. Intact retropectoral implant. Left breast: No mass or abnormal enhancement. Intact retropectoral implant.  Lymph nodes: 2 adjacent prominent intramammary lymph nodes in the lower outer quadrant of the right breast, suspicious for metastatic nodes. There are also 4 right axillary lymph nodes with mild cortical thickening, suspicious for metastatic nodes, including the recently biopsied node containing a biopsy marker clip artifact. Ancillary findings:  None. IMPRESSION: 1. 2.6 x 2.0 x 1.1 cm biopsy-proven invasive mammary carcinoma in the 9 o'clock position of the right breast with probable invasion of the adjacent pectoralis major muscle. 2. 2 adjacent possible metastatic intramammary lymph nodes in lower outer quadrant of the right breast. 3. 4 right axillary lymph nodes suspicious for metastatic nodes, including the recently biopsied metastatic node. 4. No evidence of malignancy on the left. RECOMMENDATION: Treatment plan. BI-RADS CATEGORY  6: Known biopsy-proven malignancy. Electronically Signed   By: Claudie Revering M.D.   On: 09/05/2020 16:17   DG CHEST PORT 1 VIEW  Result Date: 09/17/2020 CLINICAL DATA:  Postop Port-A-Cath EXAM: PORTABLE CHEST 1 VIEW COMPARISON:  06/21/2008 CT chest 09/14/2020 FINDINGS: Left-sided central venous port tip over the SVC. No pneumothorax is visualized. There is no focal airspace disease or effusion. The cardiac size is normal IMPRESSION: Left-sided central venous port tip over the SVC. Lungs clear. No visible pneumothorax. Electronically Signed   By: Donavan Foil M.D.   On: 09/17/2020 18:55   DG Fluoro Guide CV Line-No Report  Result Date: 09/17/2020 Fluoroscopy was utilized by the requesting physician.  No radiographic interpretation.   ECHOCARDIOGRAM COMPLETE  Result Date: 09/11/2020    ECHOCARDIOGRAM REPORT   Patient Name:   AJANEE BUREN Date of Exam: 09/11/2020 Medical Rec #:  [id number]        Height:       68.0 in Accession #:    [id number]       Weight:       135.0 lb Date of Birth:  [birth year]        BSA:          1.729 m Patient Age:    42 years         BP:            116/58 mmHg Patient Gender: F                HR:           60 bpm. Exam Location:  Inpatient Procedure: 2D Echo Indications:    Chemo Z09  History:        Patient has no prior history of Echocardiogram examinations.  Sonographer:    Mikki Santee RDCS (AE) Referring Phys: [place]  1. Left ventricular ejection fraction, by estimation, is 60 to 65%. The left ventricle has normal function. The left ventricle has no regional wall motion abnormalities. Left ventricular diastolic parameters are consistent with Grade I diastolic dysfunction (impaired relaxation). The average left ventricular global longitudinal strain is -19.5 %. The global longitudinal strain is normal.  2. Right ventricular systolic function is normal. The right ventricular size is normal. There is normal pulmonary artery systolic pressure.  3. The mitral valve is normal in structure. No evidence of mitral valve regurgitation. No evidence of mitral stenosis.  4. The aortic valve is tricuspid. Aortic valve regurgitation is not visualized. No aortic stenosis is present.  5. The inferior vena cava is normal in size with greater than 50% respiratory variability, suggesting right atrial pressure of 3 mmHg. FINDINGS  Left Ventricle: Left ventricular ejection fraction, by estimation, is 60 to 65%. The left ventricle has normal function. The left ventricle has no regional wall motion abnormalities. The average left ventricular global longitudinal strain is -19.5 %. The global longitudinal strain is normal. The left ventricular internal cavity size was normal in size. There is no left ventricular hypertrophy. Left ventricular diastolic parameters are consistent with Grade I diastolic dysfunction (impaired relaxation). Normal left ventricular filling pressure. Right Ventricle: The right ventricular size is normal. No increase in right ventricular wall thickness. Right ventricular systolic function is normal. There is normal  pulmonary artery systolic pressure. The tricuspid regurgitant velocity is 1.69 m/s, and  with an assumed right atrial pressure of 3 mmHg, the estimated right ventricular systolic pressure is 62.0 mmHg. Left Atrium: Left atrial size was normal in size. Right Atrium: Right atrial size was normal in size. Pericardium: There is no evidence of pericardial effusion. Mitral Valve: The mitral valve is normal in structure. No evidence of mitral valve regurgitation. No evidence of mitral valve stenosis. Tricuspid Valve: The tricuspid valve is normal in structure. Tricuspid valve regurgitation is trivial. No evidence of tricuspid stenosis. Aortic Valve: The aortic valve is tricuspid. Aortic valve regurgitation is not visualized. No aortic stenosis is present. Pulmonic Valve: The pulmonic valve was normal in structure. Pulmonic valve regurgitation is not visualized. No evidence of pulmonic stenosis. Aorta: The aortic root is normal in size and structure. Venous: The inferior vena cava is normal in size with greater than 50% respiratory variability, suggesting right atrial pressure of 3 mmHg. IAS/Shunts: No atrial level shunt detected by color flow Doppler.  LEFT VENTRICLE PLAX 2D LVIDd:         4.60 cm  Diastology LVIDs:         2.80 cm  LV e' medial:    8.81 cm/s LV PW:         0.80 cm  LV E/e' medial:  5.8 LV IVS:        0.70 cm  LV e' lateral:   12.80 cm/s LVOT diam:     2.10 cm  LV E/e' lateral: 4.0 LV SV:         68 LV SV Index:   39       2D Longitudinal Strain LVOT Area:     3.46 cm 2D Strain GLS (A2C):   -18.8 %  2D Strain GLS (A3C):   -21.0 %                         2D Strain GLS (A4C):   -18.8 %                         2D Strain GLS Avg:     -19.5 % RIGHT VENTRICLE RV S prime:     12.60 cm/s TAPSE (M-mode): 2.3 cm LEFT ATRIUM             Index       RIGHT ATRIUM           Index LA diam:        2.70 cm 1.56 cm/m  RA Area:     14.00 cm LA Vol (A2C):   28.9 ml 16.71 ml/m RA Volume:   36.10 ml   20.87 ml/m LA Vol (A4C):   30.6 ml 17.69 ml/m LA Biplane Vol: 29.7 ml 17.17 ml/m  AORTIC VALVE LVOT Vmax:   90.20 cm/s LVOT Vmean:  56.100 cm/s LVOT VTI:    0.197 m  AORTA Ao Root diam: 2.80 cm MITRAL VALVE               TRICUSPID VALVE MV Area (PHT): 2.07 cm    TR Peak grad:   11.4 mmHg MV Decel Time: 366 msec    TR Vmax:        169.00 cm/s MV E velocity: 51.40 cm/s MV A velocity: 66.80 cm/s  SHUNTS MV E/A ratio:  0.77        Systemic VTI:  0.20 m                            Systemic Diam: 2.10 cm Skeet Latch MD Electronically signed by Skeet Latch MD Signature Date/Time: 09/11/2020/12:22:18 PM    Final      ELIGIBLE FOR AVAILABLE RESEARCH PROTOCOL: no  ASSESSMENT: 60 y.o. [place] woman status post right breast upper outer quadrant biopsy 08/21/2020 for a clinical mT2 N1, stage IIA/B invasive ductal carcinoma, grade 3, estrogen receptor positive, progesterone receptor weakly positive, HER-2 not amplified, with an MIB-1 of 40%  (a) breast MRI 09/05/2020 shows a clinical T2 N2, stage IIIA disease  (b) bone scan and chest CT scans 09/14/2020 show no evidence of metastatic disease  (1) genetics testing results pending  (2) neoadjuvant chemotherapy will consist of cyclophosphamide and doxorubicin in dose dense fashion x4 starting 09/18/2020, to be followed by weekly paclitaxel x12  (a) echo 09/11/2020 shows an ejection fraction in the 60-65% range  (3) definitive surgery to follow  (4) adjuvant radiation  (5) adjuvant antiestrogens   PLAN: Melissa Mcgrath tolerated her first cycle of chemotherapy moderately well but we can do better.  We will change the Compazine to 5 mg instead of 10.  Beginning on day 3 if she wishes she can switch to ondansetron instead of Compazine and she understands that may cause some headaches and constipation.  As far as the constipation is concerned she will take MiraLAX daily and to stool softener tablets twice daily until that resolves.  I am not sure why she  would have a rash only on the right hip.  This argues against a drug reaction.  We are going to observe and treat symptomatically.  She is going to try Benadryl cream and a little bit of ice and  see if that helps  Given her low white count she will be on Augmentin twice daily for 5 days and we will repeat that after each [facility] treatment.  (She is intolerant of ciprofloxacin).  We will see her again on day 7 or day 8 after cycle #2 to make sure that went better and to give her some fluids  Total encounter time 35 minutes.  Virgie Dad. Rook Maue, MD 09/24/2020 8:36 AM Medical Oncology and Hematology [facility] [place], [place] [address] Tel. [phone]    Fax. [phone]   This document serves as a record of services personally performed by Lurline Del, MD. It was created on his behalf by [name], a trained medical scribe. The creation of this record is based on the scribe's personal observations and the provider's statements to them.   I, Lurline Del MD, have reviewed the above documentation for accuracy and completeness, and I agree with the above.   *Total Encounter Time as defined by the Centers for Medicare and Medicaid Services includes, in addition to the face-to-face time of a patient visit (documented in the note above) non-face-to-face time: obtaining and reviewing outside history, ordering and reviewing medications, tests or procedures, care coordination (communications with other health care professionals or caregivers) and documentation in the medical record.

## 2020-09-24 ENCOUNTER — Inpatient Hospital Stay: Payer: No Typology Code available for payment source

## 2020-09-24 ENCOUNTER — Encounter: Payer: Self-pay | Admitting: *Deleted

## 2020-09-24 ENCOUNTER — Inpatient Hospital Stay: Payer: No Typology Code available for payment source | Attending: Oncology | Admitting: Oncology

## 2020-09-24 ENCOUNTER — Other Ambulatory Visit: Payer: Self-pay

## 2020-09-24 VITALS — BP 117/69 | HR 84 | Resp 18

## 2020-09-24 VITALS — BP 87/55 | HR 83 | Temp 96.8°F | Resp 17 | Ht 68.0 in | Wt 134.7 lb

## 2020-09-24 DIAGNOSIS — Z95828 Presence of other vascular implants and grafts: Secondary | ICD-10-CM

## 2020-09-24 DIAGNOSIS — Z452 Encounter for adjustment and management of vascular access device: Secondary | ICD-10-CM | POA: Diagnosis not present

## 2020-09-24 DIAGNOSIS — C50411 Malignant neoplasm of upper-outer quadrant of right female breast: Secondary | ICD-10-CM

## 2020-09-24 DIAGNOSIS — Z170 Estrogen receptor positive status [ER+]: Secondary | ICD-10-CM | POA: Diagnosis not present

## 2020-09-24 DIAGNOSIS — Z5111 Encounter for antineoplastic chemotherapy: Secondary | ICD-10-CM | POA: Diagnosis present

## 2020-09-24 DIAGNOSIS — Z5189 Encounter for other specified aftercare: Secondary | ICD-10-CM | POA: Diagnosis not present

## 2020-09-24 DIAGNOSIS — D72819 Decreased white blood cell count, unspecified: Secondary | ICD-10-CM | POA: Insufficient documentation

## 2020-09-24 LAB — COMPREHENSIVE METABOLIC PANEL
ALT: 23 U/L (ref 0–44)
AST: 14 U/L — ABNORMAL LOW (ref 15–41)
Albumin: 3.6 g/dL (ref 3.5–5.0)
Alkaline Phosphatase: 77 U/L (ref 38–126)
Anion gap: 11 (ref 5–15)
BUN: 15 mg/dL (ref 6–20)
CO2: 23 mmol/L (ref 22–32)
Calcium: 8.7 mg/dL — ABNORMAL LOW (ref 8.9–10.3)
Chloride: 105 mmol/L (ref 98–111)
Creatinine, Ser: 0.68 mg/dL (ref 0.44–1.00)
GFR, Estimated: >60 mL/min (ref >60)
Glucose, Bld: 98 mg/dL (ref 70–99)
Potassium: 4.2 mmol/L (ref 3.5–5.1)
Sodium: 139 mmol/L (ref 135–145)
Total Bilirubin: 1 mg/dL (ref 0.3–1.2)
Total Protein: 6.7 g/dL (ref 6.5–8.1)

## 2020-09-24 LAB — CBC WITH DIFFERENTIAL/PLATELET
Abs Immature Granulocytes: 0 10*3/uL (ref 0.00–0.07)
Basophils Absolute: 0 10*3/uL (ref 0.0–0.1)
Basophils Relative: 1 %
Eosinophils Absolute: 0.1 10*3/uL (ref 0.0–0.5)
Eosinophils Relative: 6 %
HCT: 35.5 % — ABNORMAL LOW (ref 36.0–46.0)
Hemoglobin: 11.9 g/dL — ABNORMAL LOW (ref 12.0–15.0)
Lymphocytes Relative: 46 %
Lymphs Abs: 0.7 10*3/uL (ref 0.7–4.0)
MCH: 31.3 pg (ref 26.0–34.0)
MCHC: 33.5 g/dL (ref 30.0–36.0)
MCV: 93.4 fL (ref 80.0–100.0)
Monocytes Absolute: 0 10*3/uL — ABNORMAL LOW (ref 0.1–1.0)
Monocytes Relative: 2 %
Neutro Abs: 0.7 10*3/uL — ABNORMAL LOW (ref 1.7–7.7)
Neutrophils Relative %: 45 %
Platelets: 102 10*3/uL — ABNORMAL LOW (ref 150–400)
RBC: 3.8 MIL/uL — ABNORMAL LOW (ref 3.87–5.11)
RDW: 11.9 % (ref 11.5–15.5)
WBC: 1.6 10*3/uL — ABNORMAL LOW (ref 4.0–10.5)
nRBC: 0 % (ref 0.0–0.2)

## 2020-09-24 MED ORDER — POLYETHYLENE GLYCOL 3350 17 GM/SCOOP PO POWD: 17.0000 g | Freq: Every day | ORAL | 0 refills | Status: DC | PRN

## 2020-09-24 MED ORDER — CIPROFLOXACIN HCL 500 MG PO TABS: 500.0000 mg | ORAL_TABLET | Freq: Two times a day (BID) | ORAL | 0 refills | Status: DC

## 2020-09-24 MED ORDER — SODIUM CHLORIDE 0.9 % IV SOLN
INTRAVENOUS | Status: AC
Start: 2020-09-24 — End: 2020-09-24
  Filled 2020-09-24 (×2): qty 250

## 2020-09-24 MED ORDER — ONDANSETRON HCL 8 MG PO TABS: 8.0000 mg | ORAL_TABLET | Freq: Three times a day (TID) | ORAL | 0 refills | Status: DC | PRN

## 2020-09-24 MED ORDER — HEPARIN SOD (PORK) LOCK FLUSH 100 UNIT/ML IV SOLN
500.0000 [IU] | Freq: Once | INTRAVENOUS | Status: AC
  Administered 2020-09-24: 500 [IU] via INTRAVENOUS
  Filled 2020-09-24: qty 5

## 2020-09-24 MED ORDER — SODIUM CHLORIDE 0.9% FLUSH
10.0000 mL | Freq: Once | INTRAVENOUS | Status: AC
  Administered 2020-09-24: 10 mL via INTRAVENOUS
  Filled 2020-09-24: qty 10

## 2020-09-24 MED ORDER — DOCUSATE SODIUM 100 MG PO TABS: 200.0000 mg | ORAL_TABLET | Freq: Two times a day (BID) | ORAL | 0 refills | Status: DC | PRN

## 2020-09-24 NOTE — Patient Instructions (Signed)
Rehydration, Adult Rehydration is the replacement of body fluids, salts, and minerals (electrolytes) that are lost during dehydration. Dehydration is when there is not enough water or other fluids in the body. This happens when you lose more fluids than you take in. Common causes of dehydration include:  Not drinking enough fluids. This can occur when you are ill or doing activities that require a lot of energy, especially in hot weather.  Conditions that cause loss of water or other fluids, such as diarrhea, vomiting, sweating, or urinating a lot.  Other illnesses, such as fever or infection.  Certain medicines, such as those that remove excess fluid from the body (diuretics). Symptoms of mild or moderate dehydration may include thirst, dry lips and mouth, and dizziness. Symptoms of severe dehydration may include increased heart rate, confusion, fainting, and not urinating. For severe dehydration, you may need to get fluids through an IV at the hospital. For mild or moderate dehydration, you can usually rehydrate at home by drinking certain fluids as told by your health care provider. What are the risks? Generally, rehydration is safe. However, taking in too much fluid (overhydration) can be a problem. This is rare. Overhydration can cause an electrolyte imbalance, kidney failure, or a decrease in salt (sodium) levels in the body. Supplies needed You will need an oral rehydration solution (ORS) if your health care provider tells you to use one. This is a drink to treat dehydration. It can be found in pharmacies and retail stores. How to rehydrate Fluids Follow instructions from your health care provider for rehydration. The kind of fluid and the amount you should drink depend on your condition. In general, you should choose drinks that you prefer.  If told by your health care provider, drink an ORS. ? Make an ORS by following instructions on the package. ? Start by drinking small amounts,  about  cup (120 mL) every 5-10 minutes. ? Slowly increase how much you drink until you have taken the amount recommended by your health care provider.  Drink enough clear fluids to keep your urine pale yellow. If you were told to drink an ORS, finish it first, then start slowly drinking other clear fluids. Drink fluids such as: ? Water. This includes sparkling water and flavored water. Drinking only water can lead to having too little sodium in your body (hyponatremia). Follow the advice of your health care provider. ? Water from ice chips you suck on. ? Fruit juice with water you add to it (diluted). ? Sports drinks. ? Hot or cold herbal teas. ? Broth-based soups. ? Milk or milk products. Food Follow instructions from your health care provider about what to eat while you rehydrate. Your health care provider may recommend that you slowly begin eating regular foods in small amounts.  Eat foods that contain a healthy balance of electrolytes, such as bananas, oranges, potatoes, tomatoes, and spinach.  Avoid foods that are greasy or contain a lot of sugar. In some cases, you may get nutrition through a feeding tube that is passed through your nose and into your stomach (nasogastric tube, or NG tube). This may be done if you have uncontrolled vomiting or diarrhea.   Beverages to avoid Certain beverages may make dehydration worse. While you rehydrate, avoid drinking alcohol.   How to tell if you are recovering from dehydration You may be recovering from dehydration if:  You are urinating more often than before you started rehydrating.  Your urine is pale yellow.  Your energy level   improves.  You vomit less frequently.  You have diarrhea less frequently.  Your appetite improves or returns to normal.  You feel less dizzy or less light-headed.  Your skin tone and color start to look more normal. Follow these instructions at home:  Take over-the-counter and prescription medicines only  as told by your health care provider.  Do not take sodium tablets. Doing this can lead to having too much sodium in your body (hypernatremia). Contact a health care provider if:  You continue to have symptoms of mild or moderate dehydration, such as: ? Thirst. ? Dry lips. ? Slightly dry mouth. ? Dizziness. ? Dark urine or less urine than normal. ? Muscle cramps.  You continue to vomit or have diarrhea. Get help right away if you:  Have symptoms of dehydration that get worse.  Have a fever.  Have a severe headache.  Have been vomiting and the following happens: ? Your vomiting gets worse or does not go away. ? Your vomit includes blood or green matter (bile). ? You cannot eat or drink without vomiting.  Have problems with urination or bowel movements, such as: ? Diarrhea that gets worse or does not go away. ? Blood in your stool (feces). This may cause stool to look black and tarry. ? Not urinating, or urinating only a small amount of very dark urine, within 6-8 hours.  Have trouble breathing.  Have symptoms that get worse with treatment. These symptoms may represent a serious problem that is an emergency. Do not wait to see if the symptoms will go away. Get medical help right away. Call your local emergency services (911 in the [place] Do not drive yourself to the hospital. Summary  Rehydration is the replacement of body fluids and minerals (electrolytes) that are lost during dehydration.  Follow instructions from your health care provider for rehydration. The kind of fluid and amount you should drink depend on your condition.  Slowly increase how much you drink until you have taken the amount recommended by your health care provider.  Contact your health care provider if you continue to show signs of mild or moderate dehydration. This information is not intended to replace advice given to you by your health care provider. Make sure you discuss any questions you have with  your health care provider. Document Revised: 08/10/2019 Document Reviewed: 06/20/2019 Elsevier Patient Education  2021 Elsevier Inc.  

## 2020-09-25 ENCOUNTER — Ambulatory Visit: Payer: Self-pay | Admitting: Genetic Counselor

## 2020-09-25 ENCOUNTER — Telehealth: Payer: Self-pay | Admitting: Oncology

## 2020-09-25 DIAGNOSIS — Z1379 Encounter for other screening for genetic and chromosomal anomalies: Secondary | ICD-10-CM

## 2020-09-25 NOTE — Telephone Encounter (Signed)
Revealed negative genetic testing. Discussed that we do not know why she has breast cancer or why there is cancer in the family. There could be a genetic mutation in the family that Melissa Mcgrath did not inherit. There could also be a mutation in a different gene that we are not testing, or our current technology may not be able to detect certain mutations. It will therefore be important for her to stay in contact with genetics to keep up with whether additional testing may be appropriate in the future.

## 2020-09-25 NOTE — Telephone Encounter (Signed)
Scheduled per 4/4 los. Pt will receive an updated appt calendar per next visit appt notes

## 2020-09-25 NOTE — Progress Notes (Signed)
HPI:  Melissa Mcgrath was previously seen in the [facility] due to a personal and family history of cancer and concerns regarding a hereditary predisposition to cancer. Please refer to our prior cancer genetics clinic note for more information regarding our discussion, assessment and recommendations, at the time. Melissa Mcgrath recent genetic test results were disclosed to her, as were recommendations warranted by these results. These results and recommendations are discussed in more detail below.  CANCER HISTORY:  Oncology History  Malignant neoplasm of upper-outer quadrant of right breast in female, estrogen receptor positive ([place])  08/28/2020 Initial Diagnosis   Malignant neoplasm of upper-outer quadrant of right breast in female, estrogen receptor positive ([place])   08/29/2020 Cancer Staging   Staging form: Breast, AJCC 8th Edition - Clinical stage from 08/29/2020: Stage IIIA (cT2, cN2, cM0, G3, ER+, PR+, HER2-) - Signed by Gardenia Phlegm, NP on 09/07/2020 Stage prefix: Initial diagnosis Stage used in treatment planning: Yes National guidelines used in treatment planning: Yes Type of national guideline used in treatment planning: NCCN   09/18/2020 -  Chemotherapy    Patient is on Treatment Plan: BREAST ADJUVANT DOSE DENSE AC Q14D / PACLITAXEL Q7D      09/18/2020 Genetic Testing   Negative genetic testing:  No pathogenic variants detected on the [facility] CancerNext-Expanded + RNAinsight panel. The report date is 09/18/2020.   The CancerNext-Expanded + RNAinsight gene panel offered by [employer] and includes sequencing and rearrangement analysis for the following 77 genes: AIP, ALK, APC, ATM, AXIN2, BAP1, BARD1, BLM, BMPR1A, BRCA1, BRCA2, BRIP1, CDC73, CDH1, CDK4, CDKN1B, CDKN2A, CHEK2, CTNNA1, DICER1, FANCC, FH, FLCN, GALNT12, KIF1B, LZTR1, MAX, MEN1, MET, MLH1, MSH2, MSH3, MSH6, MUTYH, NBN, NF1, NF2, NTHL1, PALB2, PHOX2B, PMS2, POT1, PRKAR1A, PTCH1, PTEN, RAD51C,  RAD51D, RB1, RECQL, RET, SDHA, SDHAF2, SDHB, SDHC, SDHD, SMAD4, SMARCA4, SMARCB1, SMARCE1, STK11, SUFU, TMEM127, TP53, TSC1, TSC2, VHL and XRCC2 (sequencing and deletion/duplication); EGFR, EGLN1, HOXB13, KIT, MITF, PDGFRA, POLD1 and POLE (sequencing only); EPCAM and GREM1 (deletion/duplication only). RNA data is routinely analyzed for use in variant interpretation for all genes.     FAMILY HISTORY:  We obtained a detailed, 4-generation family history.  Significant diagnoses are listed below: Family History  Problem Relation Age of Onset  . Breast cancer Mother 47       breast  . Hypertension Father   . Heart attack Father   . Alzheimer's disease Father   . Fibromyalgia Sister   . Diabetes Sister   . Heart attack Sister   . Hypertension Sister   . Hypertension Brother   . Diabetes Brother   . Pancreatic cancer Maternal Grandmother 91       Pancreatic  . Melanoma Paternal Uncle        dx 78s  . Melanoma Paternal Uncle        dx 79s  . Ovarian cancer Paternal Aunt        dx 50s/60s   Melissa Mcgrath has two daughters (ages 43 and 82). She has two brothers (ages 55 and 50) and one sister (age 24). None of these relatives have had cancer.  Melissa Mcgrath mother died at age 31 from metastatic breast cancer (diagnosed at age 32). There is one maternal aunt, who has had three breast biopsies but has not had cancer. There is no known cancer among maternal cousins. Melissa Mcgrath maternal grandmother died at age 20 from pancreatic cancer. Her maternal grandfather died older than 53 without cancer.  Melissa Mcgrath father  died at age 52 without cancer. There were four paternal aunts and four paternal uncles. One aunt died from ovarian cancer (diagnosed in her 51s or 18s). Two uncles (half-brothers to her father) had melanoma (one diagnosed in his 61s, the other diagnosed in his 74s). There is no known cancer among paternal cousins. Melissa Mcgrath paternal grandmother died in her 10s without  cancer. Her paternal grandfather died at age 90 without cancer.  Melissa Mcgrath is unaware of previous family history of genetic testing for hereditary cancer risks. Patient's ancestors are of unknown descent. There is no reported Ashkenazi Jewish ancestry. There is no known consanguinity.  GENETIC TEST RESULTS: Genetic testing reported out on 09/18/2020 through the [facility] CancerNext-Expanded + RNAinsight panel. No pathogenic variants were detected.   The CancerNext-Expanded + RNAinsight gene panel offered by [employer] and includes sequencing and rearrangement analysis for the following 77 genes: AIP, ALK, APC, ATM, AXIN2, BAP1, BARD1, BLM, BMPR1A, BRCA1, BRCA2, BRIP1, CDC73, CDH1, CDK4, CDKN1B, CDKN2A, CHEK2, CTNNA1, DICER1, FANCC, FH, FLCN, GALNT12, KIF1B, LZTR1, MAX, MEN1, MET, MLH1, MSH2, MSH3, MSH6, MUTYH, NBN, NF1, NF2, NTHL1, PALB2, PHOX2B, PMS2, POT1, PRKAR1A, PTCH1, PTEN, RAD51C, RAD51D, RB1, RECQL, RET, SDHA, SDHAF2, SDHB, SDHC, SDHD, SMAD4, SMARCA4, SMARCB1, SMARCE1, STK11, SUFU, TMEM127, TP53, TSC1, TSC2, VHL and XRCC2 (sequencing and deletion/duplication); EGFR, EGLN1, HOXB13, KIT, MITF, PDGFRA, POLD1 and POLE (sequencing only); EPCAM and GREM1 (deletion/duplication only). RNA data is routinely analyzed for use in variant interpretation for all genes. The test report will be scanned into EPIC and located under the Molecular Pathology section of the Results Review tab.  A portion of the result report is included below for reference.     We discussed with Melissa Mcgrath that because current genetic testing is not perfect, it is possible there may be a gene mutation in one of these genes that current testing cannot detect, but that chance is small.  We also discussed that there could be another gene that has not yet been discovered, or that we have not yet tested, that is responsible for the cancer diagnoses in the family. It is also possible there is a hereditary cause for the cancer in the  family that Melissa Mcgrath did not inherit and therefore was not identified in her testing.  Therefore, it is important to remain in touch with cancer genetics in the future so that we can continue to offer Melissa Mcgrath the most up to date genetic testing.   CANCER SCREENING RECOMMENDATIONS: Melissa Mcgrath test result is considered negative (normal). This means that we have not identified a hereditary cause for her personal and family history of cancer at this time. While reassuring, this does not definitively rule out a hereditary predisposition to cancer. It is still possible that there could be genetic mutations that are undetectable by current technology. There could be genetic mutations in genes that have not been tested or identified to increase cancer risk. Therefore, it is recommended she continue to follow the cancer management and screening guidelines provided by her oncology and primary healthcare provider.   An individual's cancer risk and medical management are not determined by genetic test results alone. Overall cancer risk assessment incorporates additional factors, including personal medical history, family history, and any available genetic information that may result in a personalized plan for cancer prevention and surveillance.  RECOMMENDATIONS FOR FAMILY MEMBERS:  Individuals in this family might be at some increased risk of developing cancer, over the general population risk, simply due to the family history  of cancer.  We recommended women in this family have a yearly mammogram beginning at age 41, or 13 years younger than the earliest onset of cancer, an annual clinical breast exam, and perform monthly breast self-exams. Women in this family should also have a gynecological exam as recommended by their primary provider. All family members should be referred for colonoscopy starting at age 43.  It is also possible there is a hereditary cause for the cancer in Melissa Mcgrath's family  that she did not inherit and therefore was not identified in her. Based on Melissa Mcgrath's family history, we recommended her maternal aunt and her siblings have genetic counseling and testing. Melissa Mcgrath will let us know if we can be of any assistance in coordinating genetic counseling and/or testing for these family members.   FOLLOW-UP: Lastly, we discussed with Melissa Mcgrath that cancer genetics is a rapidly advancing field and it is possible that new genetic tests will be appropriate for her and/or her family members in the future. We encouraged her to remain in contact with cancer genetics on an annual basis so we can update her personal and family histories and let her know of advances in cancer genetics that may benefit this family.   Our contact number was provided. Ms. Virrueta questions were answered to her satisfaction, and she knows she is welcome to call us at anytime with additional questions or concerns.   Clint Guy, MS, [facility] Genetic Counselor [email or web address] Phone: [phone]

## 2020-09-27 ENCOUNTER — Other Ambulatory Visit: Payer: Self-pay

## 2020-09-27 DIAGNOSIS — Z170 Estrogen receptor positive status [ER+]: Secondary | ICD-10-CM

## 2020-09-27 DIAGNOSIS — C50411 Malignant neoplasm of upper-outer quadrant of right female breast: Secondary | ICD-10-CM

## 2020-09-27 MED ORDER — AMOXICILLIN-POT CLAVULANATE 875-125 MG PO TABS: 1.0000 | ORAL_TABLET | Freq: Two times a day (BID) | ORAL | 0 refills | Status: DC

## 2020-09-28 ENCOUNTER — Other Ambulatory Visit: Payer: Self-pay

## 2020-09-28 ENCOUNTER — Inpatient Hospital Stay (HOSPITAL_BASED_OUTPATIENT_CLINIC_OR_DEPARTMENT_OTHER): Payer: No Typology Code available for payment source | Admitting: Medical

## 2020-09-28 ENCOUNTER — Encounter: Payer: Self-pay | Admitting: Oncology

## 2020-09-28 ENCOUNTER — Telehealth: Payer: Self-pay

## 2020-09-28 VITALS — BP 105/54 | HR 78 | Temp 97.5°F | Resp 19 | Ht 68.0 in | Wt 135.0 lb

## 2020-09-28 DIAGNOSIS — Z5111 Encounter for antineoplastic chemotherapy: Secondary | ICD-10-CM | POA: Diagnosis not present

## 2020-09-28 DIAGNOSIS — K1230 Oral mucositis (ulcerative), unspecified: Secondary | ICD-10-CM

## 2020-09-28 DIAGNOSIS — R21 Rash and other nonspecific skin eruption: Secondary | ICD-10-CM | POA: Diagnosis not present

## 2020-09-28 DIAGNOSIS — Z170 Estrogen receptor positive status [ER+]: Secondary | ICD-10-CM | POA: Diagnosis not present

## 2020-09-28 DIAGNOSIS — C50411 Malignant neoplasm of upper-outer quadrant of right female breast: Secondary | ICD-10-CM

## 2020-09-28 MED ORDER — FLUCONAZOLE 50 MG PO TABS: 100.0000 mg | ORAL_TABLET | Freq: Every day | ORAL | 0 refills | Status: DC

## 2020-09-28 MED ORDER — MAGIC MOUTHWASH: ORAL | 2 refills | Status: DC

## 2020-09-28 MED ORDER — TRIAMCINOLONE ACETONIDE 0.1 % EX LOTN: 1.0000 "application " | TOPICAL_LOTION | Freq: Three times a day (TID) | CUTANEOUS | 3 refills | Status: DC

## 2020-09-28 MED ORDER — PREDNISONE 5 MG PO TABS: ORAL_TABLET | ORAL | 0 refills | Status: DC

## 2020-09-28 NOTE — Telephone Encounter (Signed)
This LPN called pt regarding message she sent. Dr Jana Hakim would like for pt to come in to [facility] today. Pt offered appt with Sandi Mealy, PA at 1200 and pt accepted. Pt understands to arrive at 1145 for check in. Message sent to scheduling.

## 2020-09-28 NOTE — Progress Notes (Signed)
[facility] Progress Note   [name] [id number] 10-13-1960 60 y.o.  Melissa Mcgrath is managed by Dr. Lurline Del  Actively treated with chemotherapy/immunotherapy/hormonal therapy: yes  Current therapy: Adriamycin and Cytoxan with pegfilgrastim support  Last treated: 09/18/2020 (cycle 1, day 1)  Next scheduled appointment with provider: 10/02/2020  Assessment: Plan:    Malignant neoplasm of upper-outer quadrant of right breast in female, estrogen receptor positive ([place])  Rash  Mucositis   ER positive malignant neoplasm of the left breast: Melissa Mcgrath continues to be managed by Dr. Jana Hakim and is status post cycle 1, day 1 of Adriamycin and Cytoxan with pegfilgrastim support.  This was dosed on 09/18/2020.  She is scheduled to return for her next visit on 10/02/2020.  Drug rash suspected secondary to Cytoxan: The patient was given a prednisone taper which she will begin tomorrow.  She was also given a prescription for topical triamcinolone lotion.  She was told to continue using Benadryl as needed and to add oatmeal soap and oatmeal lotion.  Mucositis: The patient was given a prescription for Magic mouthwash.  Please see After Visit Summary for patient specific instructions.  Future Appointments  Date Time Provider [place]  10/02/2020  8:30 AM Magrinat, Virgie Dad, MD [facility] None  10/02/2020  9:15 AM [facility] LAB [facility] None  10/02/2020  9:30 AM [facility] [place] FLUSH [facility] None  10/02/2020 10:30 AM [facility] INFUSION [facility] None  10/04/2020 12:15 PM [facility] [place] FLUSH [facility] None  10/16/2020  8:45 AM [facility] LAB [facility] None  10/16/2020  9:00 AM [facility] [place] FLUSH [facility] None  10/16/2020 10:00 AM [facility] INFUSION [facility] None  10/18/2020 12:30 PM [facility] FLUSH [facility] None  10/30/2020  8:45 AM [facility] LAB [facility] None  10/30/2020  9:00 AM [facility] [place] FLUSH [facility] None   10/30/2020 10:00 AM [facility] INFUSION [facility] None  11/01/2020 12:30 PM [facility] [place] FLUSH [facility] None  06/10/2021 12:30 PM [facility] LAB [facility] None  06/10/2021  1:00 PM Magrinat, Virgie Dad, MD [facility] None  06/10/2021  2:00 PM [facility] INFUSION [facility] None    No orders of the defined types were placed in this encounter.      Subjective:   Patient ID:  Melissa Mcgrath is a 60 y.o. (DOB May 08, 1961) female.  Chief Complaint: No chief complaint on file.   HPI Melissa Mcgrath  is a 60 y.o. female with a diagnosis of an ER positive malignant neoplasm of the left breast.  She is followed by Dr. Jana Hakim and is status post cycle 1, day 1 of Adriamycin and Cytoxan with pegfilgrastim support which was dosed on 09/18/2020.  She presents to the clinic today with a several day rash over her trunk and legs.  The rash is itching.  She has been using topical hydrocortisone cream without benefit.  She denies any changes in other activities.  She is recently been prescribed Augmentin which she continues.  She requests Diflucan today due to the risk of a vaginal yeast infection.  Medications: I have reviewed the patient's current medications.  Allergies: No Known Allergies  Past Medical History:  Diagnosis Date  . BV (bacterial vaginosis) 10/27/2014  . Depression 01/05/2014  . Facial basal cell cancer   . Family history of breast cancer   . Family history of melanoma   . Family history of ovarian cancer   . Family history of pancreatic cancer   . Menopause 01/05/2014  . Vaginal atrophy 11/09/2014  . Vaginal itching 10/27/2014  Past Surgical History:  Procedure Laterality Date  . BREAST ENHANCEMENT SURGERY    . CARPAL TUNNEL RELEASE Right   . PORTACATH PLACEMENT N/A 09/17/2020   Procedure: INSERTION PORT-A-CATH;  Surgeon: Stark Klein, MD;  Location: [facility];  Service: General;  Laterality: N/A;  . refractive lensectomy Bilateral     Family  History  Problem Relation Age of Onset  . Breast cancer Mother 32       breast  . Hypertension Father   . Heart attack Father   . Alzheimer's disease Father   . Fibromyalgia Sister   . Diabetes Sister   . Heart attack Sister   . Hypertension Sister   . Hypertension Brother   . Diabetes Brother   . Pancreatic cancer Maternal Grandmother 59       Pancreatic  . Melanoma Paternal Uncle        dx 58s  . Melanoma Paternal Uncle        dx 1s  . Ovarian cancer Paternal Aunt        dx 50s/60s    Social History   Socioeconomic History  . Marital status: Married    Spouse name: Not on file  . Number of children: Not on file  . Years of education: Not on file  . Highest education level: Not on file  Occupational History  . Not on file  Tobacco Use  . Smoking status: Never Smoker  . Smokeless tobacco: Never Used  Vaping Use  . Vaping Use: Never used  Substance and Sexual Activity  . Alcohol use: No  . Drug use: No  . Sexual activity: Not Currently    Birth control/protection: Post-menopausal  Other Topics Concern  . 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.   Social Determinants of Health   Financial Resource Strain: Low Risk   . Difficulty of Paying Living Expenses: Not very hard  Food Insecurity: No Food Insecurity  . Worried About Charity fundraiser in the Last Year: Never true  . Ran Out of Food in the Last Year: Never true  Transportation Needs: No Transportation Needs  . Lack of Transportation (Medical): No  . Lack of Transportation (Non-Medical): No  Physical Activity: Not on file  Stress: Not on file  Social Connections: Not on file  Intimate Partner Violence: Not on file    Past Medical History, Surgical history, Social history, and Family history were reviewed and updated as appropriate.   Please see review of systems for further details on the patient's review from today.   Review of  Systems:  Review of Systems  Constitutional: Negative for chills, diaphoresis and fever.  HENT: Negative for facial swelling and trouble swallowing.   Respiratory: Negative for cough, chest tightness and shortness of breath.   Cardiovascular: Negative for chest pain.  Skin: Positive for color change and rash.    Objective:   Physical Exam:  BP (!) 105/54 (BP Location: Left Arm, Patient Position: Sitting)   Pulse 78   Temp (!) 97.5 F (36.4 C) (Tympanic)   Resp 19   Ht 5\' 8"  (1.727 m)   Wt 135 lb (61.2 kg)   SpO2 100%   BMI 20.53 kg/m  ECOG: 0  Physical Exam Constitutional:      General: She is not in acute distress.    Appearance: She is not diaphoretic.  HENT:     Head: Normocephalic and atraumatic.  Eyes:  General: No scleral icterus.       Right eye: No discharge.        Left eye: No discharge.  Musculoskeletal:        General: No tenderness or deformity.  Skin:    General: Skin is warm and dry.     Findings: Erythema and rash present.     Comments: The patient is noted to have a diffuse scaling and erythematous rash over her lower extremities.  There are several small pustules noted medial to the left knee.  No exudate is noted.  Neurological:     Mental Status: She is alert.     Coordination: Coordination normal.     Gait: Gait normal.     Lab Review:     Component Value Date/Time   NA 139 09/24/2020 1149   K 4.2 09/24/2020 1149   CL 105 09/24/2020 1149   CO2 23 09/24/2020 1149   GLUCOSE 98 09/24/2020 1149   BUN 15 09/24/2020 1149   CREATININE 0.68 09/24/2020 1149   CREATININE 0.87 08/29/2020 0820   CREATININE 0.76 12/20/2013 1207   CALCIUM 8.7 (L) 09/24/2020 1149   PROT 6.7 09/24/2020 1149   ALBUMIN 3.6 09/24/2020 1149   AST 14 (L) 09/24/2020 1149   AST 12 (L) 08/29/2020 0820   ALT 23 09/24/2020 1149   ALT 12 08/29/2020 0820   ALKPHOS 77 09/24/2020 1149   BILITOT 1.0 09/24/2020 1149   BILITOT 0.6 08/29/2020 0820   GFRNONAA >60 09/24/2020  1149   GFRNONAA >60 08/29/2020 0820       Component Value Date/Time   WBC 1.6 (L) 09/24/2020 1149   RBC 3.80 (L) 09/24/2020 1149   HGB 11.9 (L) 09/24/2020 1149   HGB 12.1 08/29/2020 0820   HCT 35.5 (L) 09/24/2020 1149   PLT 102 (L) 09/24/2020 1149   PLT 260 08/29/2020 0820   MCV 93.4 09/24/2020 1149   MCH 31.3 09/24/2020 1149   MCHC 33.5 09/24/2020 1149   RDW 11.9 09/24/2020 1149   LYMPHSABS 0.7 09/24/2020 1149   MONOABS 0.0 (L) 09/24/2020 1149   EOSABS 0.1 09/24/2020 1149   BASOSABS 0.0 09/24/2020 1149   -------------------------------  Imaging from last 24 hours (if applicable):  Radiology interpretation: CT Chest W Contrast  Result Date: 09/14/2020 CLINICAL DATA:  Breast cancer staging in a 60 year old female. RIGHT-sided breast cancer by report EXAM: CT CHEST WITH CONTRAST TECHNIQUE: Multidetector CT imaging of the chest was performed during intravenous contrast administration. CONTRAST:  60mL OMNIPAQUE IOHEXOL 300 MG/ML  SOLN COMPARISON:  Bone scan evaluation of the same date. FINDINGS: Cardiovascular: Normal caliber thoracic aorta. Normal heart size without substantial pericardial effusion. Central pulmonary vasculature normal caliber. Mediastinum/Nodes: Thoracic inlet structures are normal. No axillary lymphadenopathy. Mildly prominent lymph nodes are seen bilaterally without pathologic enlargement, largest on the RIGHT approximately 7 mm on image 57 of series 2. Largest on the LEFT of similar size. Lymph nodes retain fatty hila. Is esophagus grossly normal. The no mediastinal adenopathy. No hilar adenopathy. Lungs/Pleura: Lungs are clear.  Airways are patent. Upper Abdomen: Imaged portions of liver, gallbladder, pancreas, adrenal glands and kidneys are unremarkable. Wedge-shaped area of hypoattenuation in the spleen with signs of marginal calcification along the internal margin of this area of hypodensity seen in the spleen with no adjacent stranding. Signs of a splenule adjacent  to the splenic hilum. No acute gastrointestinal process on limited evaluation of the upper abdomen. Musculoskeletal: Bilateral breast implants are in place. Soft tissue thickening with biopsy  marker in place overlying the RIGHT lateral breast likely the site of tumor. Small lymph node with biopsy marker in place is the node measured above in the RIGHT axilla. No suspicious bone lesion or destructive bone findings. IMPRESSION: 1. Soft tissue thickening with biopsy marker in place overlying the RIGHT lateral breast likely the site of tumor. Small lymph node with biopsy marker in place is the node measured above in the RIGHT axilla. No signs of nodal enlargement with pathologic features by CT criteria. 2. 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 Correlate with any upper abdominal symptoms. There are no priors for comparison area measures approximately 1.7 x 1.4 cm in greatest axial dimension. These results will be called to the ordering clinician or representative by the Radiologist Assistant, and communication documented in the PACS or [employer]. Electronically Signed   By: Zetta Bills M.D.   On: 09/14/2020 15:15   NM Bone Scan Whole Body  Result Date: 09/16/2020 CLINICAL DATA:  History of breast cancer. EXAM: NUCLEAR MEDICINE WHOLE BODY BONE SCAN TECHNIQUE: Whole body anterior and posterior images were obtained approximately 3 hours after intravenous injection of radiopharmaceutical. RADIOPHARMACEUTICALS:  19.8 mCi Technetium-49m MDP IV COMPARISON:  CT scan of same day. FINDINGS: Minimal uptake is seen involving both knees consistent with degenerative change. No other definite areas of abnormal uptake are noted. IMPRESSION: No definite scintigraphic evidence of osseous metastases. Electronically Signed   By: Marijo Conception  M.D.   On: 09/16/2020 12:30   MR BREAST BILATERAL W WO CONTRAST INC CAD  Result Date: 09/05/2020 CLINICAL DATA:  Recently diagnosed invasive mammary carcinoma in the 9 o'clock position of the right breast as well as a metastatic right axillary lymph node. There were 3 abnormal appearing right axillary lymph nodes at ultrasound. LABS:  None obtained on site today. EXAM: BILATERAL BREAST MRI WITH AND WITHOUT CONTRAST TECHNIQUE: Multiplanar, multisequence MR images of both breasts were obtained prior to and following the intravenous administration of 6 ml of Gadavist Three-dimensional MR images were rendered by post-processing of the original MR data on an independent workstation. The three-dimensional MR images were interpreted, and findings are reported in the following complete MRI report for this study. Three dimensional images were evaluated at the independent interpreting workstation using the DynaCAD thin client. COMPARISON:  Recent mammogram, ultrasound and biopsy examinations at [facility]. FINDINGS: Breast composition: c. Heterogeneous fibroglandular tissue. Background parenchymal enhancement: Mild. Right breast: Plaque-like enhancing mass containing a biopsy marker clip artifact in the 9 o'clock position of the right breast, middle depth. On image number 60 series 11, this measures 2.6 x 1.1 cm in maximum dimensions and in the sagittal plane measures 2.0 cm in cephalocaudal dimension. This has a mixture of enhancement kinetics, including rapid wash-in/washout. This corresponds to the recently biopsied malignancy. This is abutting the adjacent pectoralis major muscle with probable invasion of the muscle. More inferiorly in the lateral aspect of the right breast, in the lower outer quadrant, there are 2 adjacent crescent-shaped, circumscribed, enhancing masses in the mid to posterior aspect of the breast with rapid wash-in/washout kinetics. These have appearances suggesting mildly enlarged  intramammary lymph nodes. Intact retropectoral implant. Left breast: No mass or abnormal enhancement. Intact retropectoral implant. Lymph nodes: 2 adjacent prominent intramammary lymph nodes in the lower outer quadrant of the right breast, suspicious for metastatic nodes. There  are also 4 right axillary lymph nodes with mild cortical thickening, suspicious for metastatic nodes, including the recently biopsied node containing a biopsy marker clip artifact. Ancillary findings:  None. IMPRESSION: 1. 2.6 x 2.0 x 1.1 cm biopsy-proven invasive mammary carcinoma in the 9 o'clock position of the right breast with probable invasion of the adjacent pectoralis major muscle. 2. 2 adjacent possible metastatic intramammary lymph nodes in lower outer quadrant of the right breast. 3. 4 right axillary lymph nodes suspicious for metastatic nodes, including the recently biopsied metastatic node. 4. No evidence of malignancy on the left. RECOMMENDATION: Treatment plan. BI-RADS CATEGORY  6: Known biopsy-proven malignancy. Electronically Signed   By: Claudie Revering M.D.   On: 09/05/2020 16:17   DG CHEST PORT 1 VIEW  Result Date: 09/17/2020 CLINICAL DATA:  Postop Port-A-Cath EXAM: PORTABLE CHEST 1 VIEW COMPARISON:  06/21/2008 CT chest 09/14/2020 FINDINGS: Left-sided central venous port tip over the SVC. No pneumothorax is visualized. There is no focal airspace disease or effusion. The cardiac size is normal IMPRESSION: Left-sided central venous port tip over the SVC. Lungs clear. No visible pneumothorax. Electronically Signed   By: Donavan Foil M.D.   On: 09/17/2020 18:55   DG Fluoro Guide CV Line-No Report  Result Date: 09/17/2020 Fluoroscopy was utilized by the requesting physician.  No radiographic interpretation.   ECHOCARDIOGRAM COMPLETE  Result Date: 09/11/2020    ECHOCARDIOGRAM REPORT   Patient Name:   Melissa Mcgrath Date of Exam: 09/11/2020 Medical Rec #:  [id number]        Height:       68.0 in Accession #:     [id number]       Weight:       135.0 lb Date of Birth:  Jan 28, 1961        BSA:          1.729 m Patient Age:    57 years         BP:           116/58 mmHg Patient Gender: F                HR:           60 bpm. Exam Location:  Inpatient Procedure: 2D Echo Indications:    Chemo Z09  History:        Patient has no prior history of Echocardiogram examinations.  Sonographer:    Mikki Santee RDCS (AE) Referring Phys: [place]  1. Left ventricular ejection fraction, by estimation, is 60 to 65%. The left ventricle has normal function. The left ventricle has no regional wall motion abnormalities. Left ventricular diastolic parameters are consistent with Grade I diastolic dysfunction (impaired relaxation). The average left ventricular global longitudinal strain is -19.5 %. The global longitudinal strain is normal.  2. Right ventricular systolic function is normal. The right ventricular size is normal. There is normal pulmonary artery systolic pressure.  3. The mitral valve is normal in structure. No evidence of mitral valve regurgitation. No evidence of mitral stenosis.  4. The aortic valve is tricuspid. Aortic valve regurgitation is not visualized. No aortic stenosis is present.  5. The inferior vena cava is normal in size with greater than 50% respiratory variability, suggesting right atrial pressure of 3 mmHg. FINDINGS  Left Ventricle: Left ventricular ejection fraction, by estimation, is 60 to 65%. The left ventricle has normal function. The left ventricle has no regional wall motion abnormalities. The average left ventricular global longitudinal strain  is -19.5 %. The global longitudinal strain is normal. The left ventricular internal cavity size was normal in size. There is no left ventricular hypertrophy. Left ventricular diastolic parameters are consistent with Grade I diastolic dysfunction (impaired relaxation). Normal left ventricular filling pressure. Right Ventricle: The right  ventricular size is normal. No increase in right ventricular wall thickness. Right ventricular systolic function is normal. There is normal pulmonary artery systolic pressure. The tricuspid regurgitant velocity is 1.69 m/s, and  with an assumed right atrial pressure of 3 mmHg, the estimated right ventricular systolic pressure is 61.4 mmHg. Left Atrium: Left atrial size was normal in size. Right Atrium: Right atrial size was normal in size. Pericardium: There is no evidence of pericardial effusion. Mitral Valve: The mitral valve is normal in structure. No evidence of mitral valve regurgitation. No evidence of mitral valve stenosis. Tricuspid Valve: The tricuspid valve is normal in structure. Tricuspid valve regurgitation is trivial. No evidence of tricuspid stenosis. Aortic Valve: The aortic valve is tricuspid. Aortic valve regurgitation is not visualized. No aortic stenosis is present. Pulmonic Valve: The pulmonic valve was normal in structure. Pulmonic valve regurgitation is not visualized. No evidence of pulmonic stenosis. Aorta: The aortic root is normal in size and structure. Venous: The inferior vena cava is normal in size with greater than 50% respiratory variability, suggesting right atrial pressure of 3 mmHg. IAS/Shunts: No atrial level shunt detected by color flow Doppler.  LEFT VENTRICLE PLAX 2D LVIDd:         4.60 cm  Diastology LVIDs:         2.80 cm  LV e' medial:    8.81 cm/s LV PW:         0.80 cm  LV E/e' medial:  5.8 LV IVS:        0.70 cm  LV e' lateral:   12.80 cm/s LVOT diam:     2.10 cm  LV E/e' lateral: 4.0 LV SV:         68 LV SV Index:   39       2D Longitudinal Strain LVOT Area:     3.46 cm 2D Strain GLS (A2C):   -18.8 %                         2D Strain GLS (A3C):   -21.0 %                         2D Strain GLS (A4C):   -18.8 %                         2D Strain GLS Avg:     -19.5 % RIGHT VENTRICLE RV S prime:     12.60 cm/s TAPSE (M-mode): 2.3 cm LEFT ATRIUM             Index       RIGHT  ATRIUM           Index LA diam:        2.70 cm 1.56 cm/m  RA Area:     14.00 cm LA Vol (A2C):   28.9 ml 16.71 ml/m RA Volume:   36.10 ml  20.87 ml/m LA Vol (A4C):   30.6 ml 17.69 ml/m LA Biplane Vol: 29.7 ml 17.17 ml/m  AORTIC VALVE LVOT Vmax:   90.20 cm/s LVOT Vmean:  56.100 cm/s LVOT VTI:    0.197 m  AORTA Ao Root diam: 2.80 cm MITRAL VALVE               TRICUSPID VALVE MV Area (PHT): 2.07 cm    TR Peak grad:   11.4 mmHg MV Decel Time: 366 msec    TR Vmax:        169.00 cm/s MV E velocity: 51.40 cm/s MV A velocity: 66.80 cm/s  SHUNTS MV E/A ratio:  0.77        Systemic VTI:  0.20 m                            Systemic Diam: 2.10 cm Skeet Latch MD Electronically signed by Skeet Latch MD Signature Date/Time: 09/11/2020/12:22:18 PM    Final

## 2020-10-01 NOTE — Progress Notes (Signed)
[facility]  [phone] [phone]     ID: Melissa Mcgrath DOB: 05/13/1961  MR#: [id number]  CSN#:[id number]  Patient Care Team: Gosrani, Nimish C, MD as PCP - General (Internal Medicine) Stuart, Dawn C, RN as Oncology Nurse Navigator Martini, Keisha N, RN as Oncology Nurse Navigator Magrinat, Gustav C, MD as Consulting Physician (Oncology) Byerly, Faera, MD as Consulting Physician (General Surgery) Kinard, James, MD as Consulting Physician (Radiation Oncology) Grewal, Michelle, MD as Consulting Physician (Obstetrics and Gynecology) Jewell, Jolene R, MD as Consulting Physician (Dermatology) Gustav C Magrinat, MD OTHER MD:  CHIEF COMPLAINT: Estrogen receptor positive breast cancer  CURRENT TREATMENT: Neoadjuvant chemotherapy   INTERVAL HISTORY: Melissa Mcgrath returns today for follow up and treatment of her estrogen receptor positive breast cancer.  She is accompanied by her sister  She began neoadjuvant chemotherapy, consisting of cyclophosphamide and doxorubicin in dose dense fashion x4, at her last visit on 09/18/2020.  Today is day 1 cycle 2.  Cycle 1 was complicated by rash, which only involve the thighs.  That has largely "dried up" and no longer itches.  She was given a prednisone taper which she tolerated well.    She also had some mucositis.  Magic mouthwash was prescribed but she was not able to get it filled.  She also was instructed to start Augmentin at the last visit but she thought it was to be started today so she never took it.  Her genetic testing results were negative.  We discussed those results today as well   REVIEW OF SYSTEMS: [name] on her hair short.  She understands she will lose the rest of her hair within the next 10days.  She tells me she has had problems with fever blisters before and she takes Valtrex but does not want the generic only the brand name.  She developed some pain in the chest when she lay down at night which is  very likely due to reflux.  She says she would be entirely against taking Prilosec or similar medications because she is already on too many medications.  On the plus side she was able to go to a horse show although the amount of pollen in the air kept her mostly watching from indoor trailer.  Detailed review of systems today was otherwise stable   COVID 19 VACCINATION STATUS: Pfizer x2, most recently 01/2020, no booster as of 08/29/2020   HISTORY OF CURRENT ILLNESS: From the original intake note:  Melissa Mcgrath herself palpated an upper-outer right breast lump and noted associated intermittent pain and itching. She underwent bilateral diagnostic mammography with tomography and right breast ultrasonography at [facility] on 08/20/2020 showing: breast density category B; palpable 2.1 cm irregular mass in right breast at 9 o'clock; three abnormal-appearing right axillary tail nodes; indeterminate 4 mm oval mass located 1.8 cm lateral to dominant mass.  Accordingly on 08/21/2020 she proceeded to biopsy of the right breast area in question. The pathology from this procedure ([id number]) showed: invasive mammary carcinoma, e-cadherin positive, grade 3. Prognostic indicators significant for: estrogen receptor, 90% positive with strong staining intensity and progesterone receptor, 2% positive with weak staining intensity. Proliferation marker Ki67 at 40%. HER2 equivocal by immunohistochemistry (2+), but negative by fluorescent in situ hybridization (signals ratio and number per cell not available today).  The biopsied lymph node in the right axillary tail also showed invasive ductal carcinoma. No lymph node tissue was identified, possibly representing an entirely replaced lymph node.  Cancer Staging Malignant neoplasm of   upper-outer quadrant of right breast in female, estrogen receptor positive ([place]) Staging form: Breast, AJCC 8th Edition - Clinical stage from 08/29/2020: Stage IIIA (cT2, cN2, cM0, G3, ER+, PR+,  HER2-) - Signed by Gardenia Phlegm, NP on 09/07/2020 Stage prefix: Initial diagnosis Stage used in treatment planning: Yes National guidelines used in treatment planning: Yes Type of national guideline used in treatment planning: NCCN  The patient's subsequent history is as detailed below.   PAST MEDICAL HISTORY: Past Medical History:  Diagnosis Date  . BV (bacterial vaginosis) 10/27/2014  . Depression 01/05/2014  . Facial basal cell cancer   . Family history of breast cancer   . Family history of melanoma   . Family history of ovarian cancer   . Family history of pancreatic cancer   . Menopause 01/05/2014  . Vaginal atrophy 11/09/2014  . Vaginal itching 10/27/2014    PAST SURGICAL HISTORY: Past Surgical History:  Procedure Laterality Date  . BREAST ENHANCEMENT SURGERY    . CARPAL TUNNEL RELEASE Right   . PORTACATH PLACEMENT N/A 09/17/2020   Procedure: INSERTION PORT-A-CATH;  Surgeon: Stark Klein, MD;  Location: [facility];  Service: General;  Laterality: N/A;  . refractive lensectomy Bilateral     FAMILY HISTORY: Family History  Problem Relation Age of Onset  . Breast cancer Mother 102       breast  . Hypertension Father   . Heart attack Father   . Alzheimer's disease Father   . Fibromyalgia Sister   . Diabetes Sister   . Heart attack Sister   . Hypertension Sister   . Hypertension Brother   . Diabetes Brother   . Pancreatic cancer Maternal Grandmother 72       Pancreatic  . Melanoma Paternal Uncle        dx 30s  . Melanoma Paternal Uncle        dx 14s  . Ovarian cancer Paternal Aunt        dx 50s/60s  Her father died at age 22 from Alzheimer's. Her mother died at age 78 from metastatic breast cancer. She was initially diagnosed with breast cancer at age 87. Melissa Mcgrath has two brothers and one sister. In addition to her mother, she reports cancer in a paternal aunt (ovarian in mid 6's) and in her maternal grandmother (pancreatic at age 64).     GYNECOLOGIC HISTORY:  No LMP recorded. Patient is postmenopausal. Menarche: 60 years old Age at first live birth: 60 years old [place] P 2 LMP 2010 Contraceptive: used for approx. 20 years HRT used from 11/2019 until diagnosis  Hysterectomy? no BSO? 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 Husband Herbie Baltimore "Fritz Pickerel" is retired from working for [employer]. For exercise, she walks daily, rides horses every other week, and does farm work daily. Daughter Cline Cools, age 70, is a horse trainer/instructor. Daughter Lavell Anchors, age 81, is a Copywriter, advertising in [place]. Monette has one grandchild. She attends [employer].    ADVANCED DIRECTIVES: In the absence of any documentation to the contrary, the patient's spouse is their HCPOA.    HEALTH MAINTENANCE: Social History   Tobacco Use  . Smoking status: Never Smoker  . Smokeless tobacco: Never Used  Vaping Use  . Vaping Use: Never used  Substance Use Topics  . Alcohol use: No  . Drug use: No     Colonoscopy: never done  PAP: 01/2020  Bone density: never done   No Known  Allergies  Current Outpatient Medications  Medication Sig Dispense Refill  . valACYclovir (VALTREX) 1000 MG tablet Take 1 tablet (1,000 mg total) by mouth daily. 30 tablet 0  . amoxicillin-clavulanate (AUGMENTIN) 875-125 MG tablet Take 1 tablet by mouth 2 (two) times daily for 5 days. Start day 8 of each chemotherapy cycle. 10 tablet 0  . dexamethasone (DECADRON) 4 MG tablet Take 2 tablets (8 mg total) by mouth daily. Take daily for 3 days after chemo. Take with food. 30 tablet 1  . Docusate Sodium (STOOL SOFTENER) 100 MG capsule Take 2 tablets (200 mg total) by mouth 2 (two) times daily as needed for constipation. 10 tablet 0  . fluconazole (DIFLUCAN) 50 MG tablet Take 2 tablets (100 mg total) by mouth once for 1 dose. Take days 1-7 of each chemotherapy cycle 30 tablet 0  . lidocaine-prilocaine (EMLA) cream Apply 1 application  topically as needed. 30 g 1  . loratadine (CLARITIN) 10 MG tablet Take 1 tablet (10 mg total) by mouth daily. 90 tablet 0  . LORazepam (ATIVAN) 0.5 MG tablet Take 1 tablet (0.5 mg total) by mouth at bedtime as needed (Nausea or vomiting). 20 tablet 0  . magic mouthwash SOLN 5 ml swish and spit QID prn, oral tenderness 240 mL 2  . Multiple Vitamin (MULTIVITAMIN) tablet Take 1 tablet by mouth daily.    . ondansetron (ZOFRAN) 8 MG tablet Take 1 tablet (8 mg total) by mouth every 8 (eight) hours as needed for nausea or vomiting. Start day 3 after chemotherapy. 20 tablet 0  . oxyCODONE (OXY IR/ROXICODONE) 5 MG immediate release tablet Take 0.5-1 tablets (2.5-5 mg total) by mouth every 6 (six) hours as needed for severe pain. 5 tablet 0  . polyethylene glycol powder (MIRALAX) 17 GM/SCOOP powder Take 17 g by mouth daily as needed. 255 g 0  . prochlorperazine (COMPAZINE) 10 MG tablet Take 0.5 tablets (5 mg total) by mouth every 6 (six) hours as needed (Nausea or vomiting). 30 tablet 1  . triamcinolone lotion (KENALOG) 0.1 % Apply 1 application topically 3 (three) times daily. 120 mL 3   No current facility-administered medications for this visit.    OBJECTIVE: White woman who appears stated age  Vitals:   10/02/20 0846  BP: 119/60  Pulse: 74  Resp: 18  Temp: 97.9 F (36.6 C)  SpO2: 100%     Body mass index is 20.75 kg/m.   Wt Readings from Last 3 Encounters:  10/02/20 136 lb 8 oz (61.9 kg)  09/28/20 135 lb (61.2 kg)  09/24/20 134 lb 11.2 oz (61.1 kg)      ECOG FS:1 - Symptomatic but completely ambulatory  Sclerae unicteric, EOMs intact Wearing a mask No cervical or supraclavicular adenopathy Lungs no rales or rhonchi Heart regular rate and rhythm Abd soft, nontender, positive bowel sounds MSK no focal spinal tenderness, no upper extremity lymphedema Neuro: nonfocal, well oriented, appropriate affect Breasts: The mass in the right breast is palpable at 10:00.  It appears soft.  It is  difficult to differentiate from a bump in the implant.  The left breast and both axillae are benign.  Rash on right hip area 09/25/2018    LAB RESULTS:  CMP     Component Value Date/Time   NA 139 09/24/2020 1149   K 4.2 09/24/2020 1149   CL 105 09/24/2020 1149   CO2 23 09/24/2020 1149   GLUCOSE 98 09/24/2020 1149   BUN 15 09/24/2020 1149   CREATININE 0.68 09/24/2020   1149   CREATININE 0.87 08/29/2020 0820   CREATININE 0.76 12/20/2013 1207   CALCIUM 8.7 (L) 09/24/2020 1149   PROT 6.7 09/24/2020 1149   ALBUMIN 3.6 09/24/2020 1149   AST 14 (L) 09/24/2020 1149   AST 12 (L) 08/29/2020 0820   ALT 23 09/24/2020 1149   ALT 12 08/29/2020 0820   ALKPHOS 77 09/24/2020 1149   BILITOT 1.0 09/24/2020 1149   BILITOT 0.6 08/29/2020 0820   GFRNONAA >60 09/24/2020 1149   GFRNONAA >60 08/29/2020 0820    No results found for: TOTALPROTELP, ALBUMINELP, A1GS, A2GS, BETS, BETA2SER, GAMS, MSPIKE, SPEI  Lab Results  Component Value Date   WBC 1.6 (L) 09/24/2020   NEUTROABS 0.7 (L) 09/24/2020   HGB 11.9 (L) 09/24/2020   HCT 35.5 (L) 09/24/2020   MCV 93.4 09/24/2020   PLT 102 (L) 09/24/2020    No results found for: LABCA2  No components found for: LABCAN125  No results for input(s): INR in the last 168 hours.  No results found for: LABCA2  No results found for: CAN199  No results found for: CAN125  No results found for: CAN153  No results found for: CA2729  No components found for: HGQUANT  No results found for: CEA1 / No results found for: CEA1   No results found for: AFPTUMOR  No results found for: CHROMOGRNA  No results found for: KPAFRELGTCHN, LAMBDASER, KAPLAMBRATIO (kappa/lambda light chains)  No results found for: HGBA, HGBA2QUANT, HGBFQUANT, HGBSQUAN (Hemoglobinopathy evaluation)   No results found for: LDH  No results found for: IRON, TIBC, IRONPCTSAT (Iron and TIBC)  No results found for: FERRITIN  Urinalysis No results found for: COLORURINE,  APPEARANCEUR, LABSPEC, PHURINE, GLUCOSEU, HGBUR, BILIRUBINUR, KETONESUR, PROTEINUR, UROBILINOGEN, NITRITE, LEUKOCYTESUR   STUDIES: CT Chest W Contrast  Result Date: 09/14/2020 CLINICAL DATA:  Breast cancer staging in a 59-year-old female. RIGHT-sided breast cancer by report EXAM: CT CHEST WITH CONTRAST TECHNIQUE: Multidetector CT imaging of the chest was performed during intravenous contrast administration. CONTRAST:  75mL OMNIPAQUE IOHEXOL 300 MG/ML  SOLN COMPARISON:  Bone scan evaluation of the same date. FINDINGS: Cardiovascular: Normal caliber thoracic aorta. Normal heart size without substantial pericardial effusion. Central pulmonary vasculature normal caliber. Mediastinum/Nodes: Thoracic inlet structures are normal. No axillary lymphadenopathy. Mildly prominent lymph nodes are seen bilaterally without pathologic enlargement, largest on the RIGHT approximately 7 mm on image 57 of series 2. Largest on the LEFT of similar size. Lymph nodes retain fatty hila. Is esophagus grossly normal. The no mediastinal adenopathy. No hilar adenopathy. Lungs/Pleura: Lungs are clear.  Airways are patent. Upper Abdomen: Imaged portions of liver, gallbladder, pancreas, adrenal glands and kidneys are unremarkable. Wedge-shaped area of hypoattenuation in the spleen with signs of marginal calcification along the internal margin of this area of hypodensity seen in the spleen with no adjacent stranding. Signs of a splenule adjacent to the splenic hilum. No acute gastrointestinal process on limited evaluation of the upper abdomen. Musculoskeletal: Bilateral breast implants are in place. Soft tissue thickening with biopsy marker in place overlying the RIGHT lateral breast likely the site of tumor. Small lymph node with biopsy marker in place is the node measured above in the RIGHT axilla. No suspicious bone lesion or destructive bone findings. IMPRESSION: 1. Soft tissue thickening with biopsy marker in place overlying the RIGHT  lateral breast likely the site of tumor. Small lymph node with biopsy marker in place is the node measured above in the RIGHT axilla. No signs of nodal enlargement with pathologic features by   CT criteria. 2. 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 Correlate with any upper abdominal symptoms. There are no priors for comparison area measures approximately 1.7 x 1.4 cm in greatest axial dimension. These results will be called to the ordering clinician or representative by the Radiologist Assistant, and communication documented in the PACS or [facility] Dashboard. Electronically Signed   By: Geoffrey  Wile M.D.   On: 09/14/2020 15:15   NM Bone Scan Whole Body  Result Date: 09/16/2020 CLINICAL DATA:  History of breast cancer. EXAM: NUCLEAR MEDICINE WHOLE BODY BONE SCAN TECHNIQUE: Whole body anterior and posterior images were obtained approximately 3 hours after intravenous injection of radiopharmaceutical. RADIOPHARMACEUTICALS:  19.8 mCi Technetium-99m MDP IV COMPARISON:  CT scan of same day. FINDINGS: Minimal uptake is seen involving both knees consistent with degenerative change. No other definite areas of abnormal uptake are noted. IMPRESSION: No definite scintigraphic evidence of osseous metastases. Electronically Signed   By: James  Green Jr M.D.   On: 09/16/2020 12:30   MR BREAST BILATERAL W WO CONTRAST INC CAD  Result Date: 09/05/2020 CLINICAL DATA:  Recently diagnosed invasive mammary carcinoma in the 9 o'clock position of the right breast as well as a metastatic right axillary lymph node. There were 3 abnormal appearing right axillary lymph nodes at ultrasound. LABS:  None obtained on site today. EXAM: BILATERAL BREAST MRI WITH AND WITHOUT CONTRAST TECHNIQUE: Multiplanar, multisequence MR images of both breasts were obtained prior  to and following the intravenous administration of 6 ml of Gadavist Three-dimensional MR images were rendered by post-processing of the original MR data on an independent workstation. The three-dimensional MR images were interpreted, and findings are reported in the following complete MRI report for this study. Three dimensional images were evaluated at the independent interpreting workstation using the DynaCAD thin client. COMPARISON:  Recent mammogram, ultrasound and biopsy examinations at [facility]. FINDINGS: Breast composition: c. Heterogeneous fibroglandular tissue. Background parenchymal enhancement: Mild. Right breast: Plaque-like enhancing mass containing a biopsy marker clip artifact in the 9 o'clock position of the right breast, middle depth. On image number 60 series 11, this measures 2.6 x 1.1 cm in maximum dimensions and in the sagittal plane measures 2.0 cm in cephalocaudal dimension. This has a mixture of enhancement kinetics, including rapid wash-in/washout. This corresponds to the recently biopsied malignancy. This is abutting the adjacent pectoralis major muscle with probable invasion of the muscle. More inferiorly in the lateral aspect of the right breast, in the lower outer quadrant, there are 2 adjacent crescent-shaped, circumscribed, enhancing masses in the mid to posterior aspect of the breast with rapid wash-in/washout kinetics. These have appearances suggesting mildly enlarged intramammary lymph nodes. Intact retropectoral implant. Left breast: No mass or abnormal enhancement. Intact retropectoral implant. Lymph nodes: 2 adjacent prominent intramammary lymph nodes in the lower outer quadrant of the right breast, suspicious for metastatic nodes. There are also 4 right axillary lymph nodes with mild cortical thickening, suspicious for metastatic nodes, including the recently biopsied node containing a biopsy marker clip artifact. Ancillary findings:  None. IMPRESSION: 1. 2.6 x 2.0 x  1.1 cm biopsy-proven invasive mammary carcinoma in the 9 o'clock position of the right breast with probable invasion of the adjacent pectoralis major muscle. 2. 2 adjacent possible metastatic intramammary lymph nodes in lower outer quadrant of the right breast. 3. 4 right axillary lymph nodes suspicious for   metastatic nodes, including the recently biopsied metastatic node. 4. No evidence of malignancy on the left. RECOMMENDATION: Treatment plan. BI-RADS CATEGORY  6: Known biopsy-proven malignancy. Electronically Signed   By: Steven  Reid M.D.   On: 09/05/2020 16:17   DG CHEST PORT 1 VIEW  Result Date: 09/17/2020 CLINICAL DATA:  Postop Port-A-Cath EXAM: PORTABLE CHEST 1 VIEW COMPARISON:  06/21/2008 CT chest 09/14/2020 FINDINGS: Left-sided central venous port tip over the SVC. No pneumothorax is visualized. There is no focal airspace disease or effusion. The cardiac size is normal IMPRESSION: Left-sided central venous port tip over the SVC. Lungs clear. No visible pneumothorax. Electronically Signed   By: Kim  Fujinaga M.D.   On: 09/17/2020 18:55   DG Fluoro Guide CV Line-No Report  Result Date: 09/17/2020 Fluoroscopy was utilized by the requesting physician.  No radiographic interpretation.   ECHOCARDIOGRAM COMPLETE  Result Date: 09/11/2020    ECHOCARDIOGRAM REPORT   Patient Name:   Melissa Mcgrath Date of Exam: 09/11/2020 Medical Rec #:  [id number]        Height:       68.0 in Accession #:    [id number]       Weight:       135.0 lb Date of Birth:  11/02/1960        BSA:          1.729 m Patient Age:    59 years         BP:           116/58 mmHg Patient Gender: F                HR:           60 bpm. Exam Location:  Inpatient Procedure: 2D Echo Indications:    Chemo Z09  History:        Patient has no prior history of Echocardiogram examinations.  Sonographer:    Casey Kirkpatrick RDCS (AE) Referring Phys: [id number] GUSTAV C MAGRINAT IMPRESSIONS  1. Left ventricular ejection fraction, by estimation, is 60 to  65%. The left ventricle has normal function. The left ventricle has no regional wall motion abnormalities. Left ventricular diastolic parameters are consistent with Grade I diastolic dysfunction (impaired relaxation). The average left ventricular global longitudinal strain is -19.5 %. The global longitudinal strain is normal.  2. Right ventricular systolic function is normal. The right ventricular size is normal. There is normal pulmonary artery systolic pressure.  3. The mitral valve is normal in structure. No evidence of mitral valve regurgitation. No evidence of mitral stenosis.  4. The aortic valve is tricuspid. Aortic valve regurgitation is not visualized. No aortic stenosis is present.  5. The inferior vena cava is normal in size with greater than 50% respiratory variability, suggesting right atrial pressure of 3 mmHg. FINDINGS  Left Ventricle: Left ventricular ejection fraction, by estimation, is 60 to 65%. The left ventricle has normal function. The left ventricle has no regional wall motion abnormalities. The average left ventricular global longitudinal strain is -19.5 %. The global longitudinal strain is normal. The left ventricular internal cavity size was normal in size. There is no left ventricular hypertrophy. Left ventricular diastolic parameters are consistent with Grade I diastolic dysfunction (impaired relaxation). Normal left ventricular filling pressure. Right Ventricle: The right ventricular size is normal. No increase in right ventricular wall thickness. Right ventricular systolic function is normal. There is normal pulmonary artery systolic pressure. The tricuspid regurgitant velocity is 1.69 m/s, and  with an assumed right   atrial pressure of 3 mmHg, the estimated right ventricular systolic pressure is 26.9 mmHg. Left Atrium: Left atrial size was normal in size. Right Atrium: Right atrial size was normal in size. Pericardium: There is no evidence of pericardial effusion. Mitral Valve: The  mitral valve is normal in structure. No evidence of mitral valve regurgitation. No evidence of mitral valve stenosis. Tricuspid Valve: The tricuspid valve is normal in structure. Tricuspid valve regurgitation is trivial. No evidence of tricuspid stenosis. Aortic Valve: The aortic valve is tricuspid. Aortic valve regurgitation is not visualized. No aortic stenosis is present. Pulmonic Valve: The pulmonic valve was normal in structure. Pulmonic valve regurgitation is not visualized. No evidence of pulmonic stenosis. Aorta: The aortic root is normal in size and structure. Venous: The inferior vena cava is normal in size with greater than 50% respiratory variability, suggesting right atrial pressure of 3 mmHg. IAS/Shunts: No atrial level shunt detected by color flow Doppler.  LEFT VENTRICLE PLAX 2D LVIDd:         4.60 cm  Diastology LVIDs:         2.80 cm  LV e' medial:    8.81 cm/s LV PW:         0.80 cm  LV E/e' medial:  5.8 LV IVS:        0.70 cm  LV e' lateral:   12.80 cm/s LVOT diam:     2.10 cm  LV E/e' lateral: 4.0 LV SV:         68 LV SV Index:   39       2D Longitudinal Strain LVOT Area:     3.46 cm 2D Strain GLS (A2C):   -18.8 %                         2D Strain GLS (A3C):   -21.0 %                         2D Strain GLS (A4C):   -18.8 %                         2D Strain GLS Avg:     -19.5 % RIGHT VENTRICLE RV S prime:     12.60 cm/s TAPSE (M-mode): 2.3 cm LEFT ATRIUM             Index       RIGHT ATRIUM           Index LA diam:        2.70 cm 1.56 cm/m  RA Area:     14.00 cm LA Vol (A2C):   28.9 ml 16.71 ml/m RA Volume:   36.10 ml  20.87 ml/m LA Vol (A4C):   30.6 ml 17.69 ml/m LA Biplane Vol: 29.7 ml 17.17 ml/m  AORTIC VALVE LVOT Vmax:   90.20 cm/s LVOT Vmean:  56.100 cm/s LVOT VTI:    0.197 m  AORTA Ao Root diam: 2.80 cm MITRAL VALVE               TRICUSPID VALVE MV Area (PHT): 2.07 cm    TR Peak grad:   11.4 mmHg MV Decel Time: 366 msec    TR Vmax:        169.00 cm/s MV E velocity: 51.40 cm/s MV A  velocity: 66.80 cm/s  SHUNTS MV E/A ratio:  0.77        Systemic  VTI:  0.20 m                            Systemic Diam: 2.10 cm Melissa Rawlings MD Electronically signed by Melissa Preston MD Signature Date/Time: 09/11/2020/12:22:18 PM    Final      ELIGIBLE FOR AVAILABLE RESEARCH PROTOCOL: no  ASSESSMENT: 59 y.o. [place] woman status post right breast upper outer quadrant biopsy 08/21/2020 for a clinical mT2 N1, stage IIA/B invasive ductal carcinoma, grade 3, estrogen receptor positive, progesterone receptor weakly positive, HER-2 not amplified, with an MIB-1 of 40%  (a) breast MRI 09/05/2020 shows a clinical T2 N2, stage IIIA disease  (b) bone scan and chest CT scan 09/14/2020 show no evidence of metastatic disease  (1) genetics testing 09/18/2020 through the   CancerNext-Expanded + RNAinsight gene panel offered by [facility] found no deleterious mutations in AIP, ALK, APC, ATM, AXIN2, BAP1, BARD1, BLM, BMPR1A, BRCA1, BRCA2, BRIP1, CDC73, CDH1, CDK4, CDKN1B, CDKN2A, CHEK2, CTNNA1, DICER1, FANCC, FH, FLCN, GALNT12, KIF1B, LZTR1, MAX, MEN1, MET, MLH1, MSH2, MSH3, MSH6, MUTYH, NBN, NF1, NF2, NTHL1, PALB2, PHOX2B, PMS2, POT1, PRKAR1A, PTCH1, PTEN, RAD51C, RAD51D, RB1, RECQL, RET, SDHA, SDHAF2, SDHB, SDHC, SDHD, SMAD4, SMARCA4, SMARCB1, SMARCE1, STK11, SUFU, TMEM127, TP53, TSC1, TSC2, VHL and XRCC2 (sequencing and deletion/duplication); EGFR, EGLN1, HOXB13, KIT, MITF, PDGFRA, POLD1 and POLE (sequencing only); EPCAM and GREM1 (deletion/duplication only). RNA data is routinely analyzed for use in variant interpretation for all genes.   (2) neoadjuvant chemotherapy will consist of cyclophosphamide and doxorubicin in dose dense fashion x4 starting 09/18/2020, to be followed by weekly paclitaxel x12  (a) echo 09/11/2020 shows an ejection fraction in the 60-65% range  (3) definitive surgery to follow  (4) adjuvant radiation  (5) adjuvant antiestrogens   PLAN: Titania tolerated her first cycle of  chemotherapy moderately well but we can do better.  We will change the Compazine to 5 mg instead of 10.  Beginning on day 3 if she wishes she can switch to ondansetron instead of Compazine and she understands that may cause some headaches and constipation.  As far as the constipation is concerned she will take MiraLAX daily and to stool softener tablets twice daily until that resolves.  I am not sure why she would have a rash only on the right hip.  This argues against a drug reaction.  We are going to observe and treat symptomatically.  She is going to try Benadryl cream and a little bit of ice and see if that helps  Given her low white count she will be on Augmentin twice daily for 5 days and we will repeat that after each AC treatment.  (She is intolerant of ciprofloxacin).  We will see her again on day 7 or day 8 after cycle #2 to make sure that went better and to give her some fluids  Total encounter time 35 minutes.   Gustav C. Magrinat, MD 10/02/2020 9:13 AM Medical Oncology and Hematology [facility] [address] [place], [place] [address] Tel. [phone]    Fax. [phone]   This document serves as a record of services personally performed by Gustav Magrinat, MD. It was created on his behalf by Katie Daubenspeck, a trained medical scribe. The creation of this record is based on the scribe's personal observations and the provider's statements to them.   I, Gustav Magrinat MD, have reviewed the above documentation for accuracy and completeness, and I agree with the above.   *  Total Encounter Time as defined by the Centers for Medicare and Medicaid Services includes, in addition to the face-to-face time of a patient visit (documented in the note above) non-face-to-face time: obtaining and reviewing outside history, ordering and reviewing medications, tests or procedures, care coordination (communications with other health care professionals or caregivers) and  documentation in the medical record.  

## 2020-10-02 ENCOUNTER — Inpatient Hospital Stay: Payer: No Typology Code available for payment source

## 2020-10-02 ENCOUNTER — Inpatient Hospital Stay (HOSPITAL_BASED_OUTPATIENT_CLINIC_OR_DEPARTMENT_OTHER): Payer: No Typology Code available for payment source | Admitting: Oncology

## 2020-10-02 ENCOUNTER — Other Ambulatory Visit: Payer: Self-pay

## 2020-10-02 VITALS — BP 119/60 | HR 74 | Temp 97.9°F | Resp 18 | Ht 68.0 in | Wt 136.5 lb

## 2020-10-02 DIAGNOSIS — Z5111 Encounter for antineoplastic chemotherapy: Secondary | ICD-10-CM | POA: Diagnosis not present

## 2020-10-02 DIAGNOSIS — Z170 Estrogen receptor positive status [ER+]: Secondary | ICD-10-CM

## 2020-10-02 DIAGNOSIS — C50411 Malignant neoplasm of upper-outer quadrant of right female breast: Secondary | ICD-10-CM | POA: Diagnosis not present

## 2020-10-02 DIAGNOSIS — Z95828 Presence of other vascular implants and grafts: Secondary | ICD-10-CM

## 2020-10-02 LAB — COMPREHENSIVE METABOLIC PANEL
ALT: 16 U/L (ref 0–44)
AST: 15 U/L (ref 15–41)
Albumin: 3.7 g/dL (ref 3.5–5.0)
Alkaline Phosphatase: 81 U/L (ref 38–126)
Anion gap: 10 (ref 5–15)
BUN: 15 mg/dL (ref 6–20)
CO2: 26 mmol/L (ref 22–32)
Calcium: 9.1 mg/dL (ref 8.9–10.3)
Chloride: 107 mmol/L (ref 98–111)
Creatinine, Ser: 0.73 mg/dL (ref 0.44–1.00)
GFR, Estimated: >60 mL/min (ref >60)
Glucose, Bld: 103 mg/dL — ABNORMAL HIGH (ref 70–99)
Potassium: 3.4 mmol/L — ABNORMAL LOW (ref 3.5–5.1)
Sodium: 143 mmol/L (ref 135–145)
Total Bilirubin: <0.2 mg/dL — ABNORMAL LOW (ref 0.3–1.2)
Total Protein: 6.6 g/dL (ref 6.5–8.1)

## 2020-10-02 LAB — CBC WITH DIFFERENTIAL/PLATELET
Abs Immature Granulocytes: 2.79 10*3/uL — ABNORMAL HIGH (ref 0.00–0.07)
Basophils Absolute: 0.1 10*3/uL (ref 0.0–0.1)
Basophils Relative: 0 %
Eosinophils Absolute: 0 10*3/uL (ref 0.0–0.5)
Eosinophils Relative: 0 %
HCT: 30.4 % — ABNORMAL LOW (ref 36.0–46.0)
Hemoglobin: 10.1 g/dL — ABNORMAL LOW (ref 12.0–15.0)
Immature Granulocytes: 21 %
Lymphocytes Relative: 12 %
Lymphs Abs: 1.6 10*3/uL (ref 0.7–4.0)
MCH: 31.8 pg (ref 26.0–34.0)
MCHC: 33.2 g/dL (ref 30.0–36.0)
MCV: 95.6 fL (ref 80.0–100.0)
Monocytes Absolute: 1 10*3/uL (ref 0.1–1.0)
Monocytes Relative: 7 %
Neutro Abs: 8 10*3/uL — ABNORMAL HIGH (ref 1.7–7.7)
Neutrophils Relative %: 60 %
Platelets: 337 10*3/uL (ref 150–400)
RBC: 3.18 MIL/uL — ABNORMAL LOW (ref 3.87–5.11)
RDW: 12.3 % (ref 11.5–15.5)
WBC: 13.4 10*3/uL — ABNORMAL HIGH (ref 4.0–10.5)
nRBC: 0.4 % — ABNORMAL HIGH (ref 0.0–0.2)

## 2020-10-02 MED ORDER — AMOXICILLIN-POT CLAVULANATE 875-125 MG PO TABS: 1.0000 | ORAL_TABLET | Freq: Two times a day (BID) | ORAL | 0 refills | Status: DC

## 2020-10-02 MED ORDER — SODIUM CHLORIDE 0.9% FLUSH
10.0000 mL | INTRAVENOUS | Status: AC | PRN
Start: 2020-10-02 — End: 2020-10-02
  Administered 2020-10-02: 10 mL
  Filled 2020-10-02: qty 10

## 2020-10-02 MED ORDER — PALONOSETRON HCL INJECTION 0.25 MG/5ML
0.2500 mg | Freq: Once | INTRAVENOUS | Status: AC
  Administered 2020-10-02: 0.25 mg via INTRAVENOUS

## 2020-10-02 MED ORDER — HEPARIN SOD (PORK) LOCK FLUSH 100 UNIT/ML IV SOLN
500.0000 [IU] | Freq: Once | INTRAVENOUS | Status: AC | PRN
  Administered 2020-10-02: 500 [IU]
  Filled 2020-10-02: qty 5

## 2020-10-02 MED ORDER — SODIUM CHLORIDE 0.9% FLUSH
10.0000 mL | INTRAVENOUS | Status: DC | PRN
  Administered 2020-10-02: 10 mL
  Filled 2020-10-02: qty 10

## 2020-10-02 MED ORDER — HEPARIN SOD (PORK) LOCK FLUSH 100 UNIT/ML IV SOLN
500.0000 [IU] | INTRAVENOUS | Status: DC | PRN
  Filled 2020-10-02: qty 5

## 2020-10-02 MED ORDER — FLUCONAZOLE 50 MG PO TABS: 100.0000 mg | ORAL_TABLET | Freq: Once | ORAL | 0 refills | Status: AC

## 2020-10-02 MED ORDER — ALTEPLASE 2 MG IJ SOLR
2.0000 mg | Freq: Once | INTRAMUSCULAR | Status: DC | PRN
  Filled 2020-10-02: qty 2

## 2020-10-02 MED ORDER — SODIUM CHLORIDE 0.9 % IV SOLN
Freq: Once | INTRAVENOUS | Status: AC
Start: 2020-10-02 — End: 2020-10-02
  Filled 2020-10-02: qty 250

## 2020-10-02 MED ORDER — SODIUM CHLORIDE 0.9 % IV SOLN
150.0000 mg | Freq: Once | INTRAVENOUS | Status: AC
  Administered 2020-10-02: 150 mg via INTRAVENOUS
  Filled 2020-10-02: qty 150

## 2020-10-02 MED ORDER — MAGIC MOUTHWASH: ORAL | 2 refills | Status: DC

## 2020-10-02 MED ORDER — PALONOSETRON HCL INJECTION 0.25 MG/5ML
INTRAVENOUS | Status: AC
  Filled 2020-10-02: qty 5

## 2020-10-02 MED ORDER — VALACYCLOVIR HCL 1 G PO TABS: 1000.0000 mg | ORAL_TABLET | Freq: Every day | ORAL | 0 refills | Status: DC

## 2020-10-02 MED ORDER — SODIUM CHLORIDE 0.9 % IV SOLN
600.0000 mg/m2 | Freq: Once | INTRAVENOUS | Status: AC
  Administered 2020-10-02: 1040 mg via INTRAVENOUS
  Filled 2020-10-02: qty 52

## 2020-10-02 MED ORDER — SODIUM CHLORIDE 0.9 % IV SOLN
10.0000 mg | Freq: Once | INTRAVENOUS | Status: AC
  Administered 2020-10-02: 10 mg via INTRAVENOUS
  Filled 2020-10-02: qty 10

## 2020-10-02 MED ORDER — DOXORUBICIN HCL CHEMO IV INJECTION 2 MG/ML
60.0000 mg/m2 | Freq: Once | INTRAVENOUS | Status: AC
  Administered 2020-10-02: 104 mg via INTRAVENOUS
  Filled 2020-10-02: qty 52

## 2020-10-02 NOTE — Patient Instructions (Addendum)
[facility] Discharge Instructions for Patients Receiving Chemotherapy  Today you received the following chemotherapy agents: Doxorubicin (Adriamycin) and Cyclophosphamide (Cytoxan)  To help prevent nausea and vomiting after your treatment, we encourage you to take your nausea medication as directed by your MD.   If you develop nausea and vomiting that is not controlled by your nausea medication, call the clinic.   BELOW ARE SYMPTOMS THAT SHOULD BE REPORTED IMMEDIATELY:  *FEVER GREATER THAN 100.5 F  *CHILLS WITH OR WITHOUT FEVER  NAUSEA AND VOMITING THAT IS NOT CONTROLLED WITH YOUR NAUSEA MEDICATION  *UNUSUAL SHORTNESS OF BREATH  *UNUSUAL BRUISING OR BLEEDING  TENDERNESS IN MOUTH AND THROAT WITH OR WITHOUT PRESENCE OF ULCERS  *URINARY PROBLEMS  *BOWEL PROBLEMS  UNUSUAL RASH Items with * indicate a potential emergency and should be followed up as soon as possible.  Feel free to call the clinic should you have any questions or concerns. The clinic phone number is [phone].  Please show the [facility] at check-in to the Emergency Department and triage nurse.   Hypokalemia Hypokalemia means that the amount of potassium in the blood is lower than normal. Potassium is a chemical (electrolyte) that helps regulate the amount of fluid in the body. It also stimulates muscle tightening (contraction) and helps nerves work properly. Normally, most of the body's potassium is inside cells, and only a very small amount is in the blood. Because the amount in the blood is so small, minor changes to potassium levels in the blood can be life-threatening. What are the causes? This condition may be caused by:  Antibiotic medicine.  Diarrhea or vomiting. Taking too much of a medicine that helps you have a bowel movement (laxative) can cause diarrhea and lead to hypokalemia.  Chronic kidney disease (CKD).  Medicines that help the body get rid of excess fluid  (diuretics).  Eating disorders, such as bulimia.  Low magnesium levels in the body.  Sweating a lot. What are the signs or symptoms? Symptoms of this condition include:  Weakness.  Constipation.  Fatigue.  Muscle cramps.  Mental confusion.  Skipped heartbeats or irregular heartbeat (palpitations).  Tingling or numbness. How is this diagnosed? This condition is diagnosed with a blood test. How is this treated? This condition may be treated by:  Taking potassium supplements by mouth.  Adjusting the medicines that you take.  Eating more foods that contain a lot of potassium. If your potassium level is very low, you may need to get potassium through an IV and be monitored in the hospital. Follow these instructions at home:  Take over-the-counter and prescription medicines only as told by your health care provider. This includes vitamins and supplements.  Eat a healthy diet. A healthy diet includes fresh fruits and vegetables, whole grains, healthy fats, and lean proteins.  If instructed, eat more foods that contain a lot of potassium. This includes: ? Nuts, such as peanuts and pistachios. ? Seeds, such as sunflower seeds and pumpkin seeds. ? Peas, lentils, and lima beans. ? Whole grain and bran cereals and breads. ? Fresh fruits and vegetables, such as apricots, avocado, bananas, cantaloupe, kiwi, oranges, tomatoes, asparagus, and potatoes. ? Orange juice. ? Tomato juice. ? Red meats. ? Yogurt.  Keep all follow-up visits as told by your health care provider. This is important.   Contact a health care provider if you:  Have weakness that gets worse.  Feel your heart pounding or racing.  Vomit.  Have diarrhea.  Have diabetes (  diabetes mellitus) and you have trouble keeping your blood sugar (glucose) in your target range. Get help right away if you:  Have chest pain.  Have shortness of breath.  Have vomiting or diarrhea that lasts for more than 2  days.  Faint. Summary  Hypokalemia means that the amount of potassium in the blood is lower than normal.  This condition is diagnosed with a blood test.  Hypokalemia may be treated by taking potassium supplements, adjusting the medicines that you take, or eating more foods that are high in potassium.  If your potassium level is very low, you may need to get potassium through an IV and be monitored in the hospital. This information is not intended to replace advice given to you by your health care provider. Make sure you discuss any questions you have with your health care provider. Document Revised: 01/20/2018 Document Reviewed: 01/20/2018 Elsevier Patient Education  [employer].

## 2020-10-04 ENCOUNTER — Other Ambulatory Visit: Payer: Self-pay

## 2020-10-04 ENCOUNTER — Telehealth: Payer: Self-pay | Admitting: Oncology

## 2020-10-04 ENCOUNTER — Inpatient Hospital Stay: Payer: No Typology Code available for payment source

## 2020-10-04 VITALS — BP 131/56 | HR 71 | Temp 96.0°F | Resp 17 | Wt 136.8 lb

## 2020-10-04 DIAGNOSIS — Z5111 Encounter for antineoplastic chemotherapy: Secondary | ICD-10-CM | POA: Diagnosis not present

## 2020-10-04 DIAGNOSIS — Z95828 Presence of other vascular implants and grafts: Secondary | ICD-10-CM | POA: Insufficient documentation

## 2020-10-04 DIAGNOSIS — C50411 Malignant neoplasm of upper-outer quadrant of right female breast: Secondary | ICD-10-CM

## 2020-10-04 HISTORY — DX: Presence of other vascular implants and grafts: Z95.828

## 2020-10-04 LAB — CBC WITH DIFFERENTIAL/PLATELET
Abs Immature Granulocytes: 0.16 10*3/uL — ABNORMAL HIGH (ref 0.00–0.07)
Basophils Absolute: 0 10*3/uL (ref 0.0–0.1)
Basophils Relative: 0 %
Eosinophils Absolute: 0 10*3/uL (ref 0.0–0.5)
Eosinophils Relative: 0 %
HCT: 30.7 % — ABNORMAL LOW (ref 36.0–46.0)
Hemoglobin: 10.2 g/dL — ABNORMAL LOW (ref 12.0–15.0)
Immature Granulocytes: 2 %
Lymphocytes Relative: 4 %
Lymphs Abs: 0.4 10*3/uL — ABNORMAL LOW (ref 0.7–4.0)
MCH: 31.9 pg (ref 26.0–34.0)
MCHC: 33.2 g/dL (ref 30.0–36.0)
MCV: 95.9 fL (ref 80.0–100.0)
Monocytes Absolute: 0.3 10*3/uL (ref 0.1–1.0)
Monocytes Relative: 3 %
Neutro Abs: 8.6 10*3/uL — ABNORMAL HIGH (ref 1.7–7.7)
Neutrophils Relative %: 91 %
Platelets: 348 10*3/uL (ref 150–400)
RBC: 3.2 MIL/uL — ABNORMAL LOW (ref 3.87–5.11)
RDW: 12.6 % (ref 11.5–15.5)
WBC: 9.4 10*3/uL (ref 4.0–10.5)
nRBC: 0 % (ref 0.0–0.2)

## 2020-10-04 LAB — COMPREHENSIVE METABOLIC PANEL
ALT: 18 U/L (ref 0–44)
AST: 14 U/L — ABNORMAL LOW (ref 15–41)
Albumin: 3.7 g/dL (ref 3.5–5.0)
Alkaline Phosphatase: 77 U/L (ref 38–126)
Anion gap: 10 (ref 5–15)
BUN: 21 mg/dL — ABNORMAL HIGH (ref 6–20)
CO2: 26 mmol/L (ref 22–32)
Calcium: 8.8 mg/dL — ABNORMAL LOW (ref 8.9–10.3)
Chloride: 103 mmol/L (ref 98–111)
Creatinine, Ser: 0.7 mg/dL (ref 0.44–1.00)
GFR, Estimated: >60 mL/min (ref >60)
Glucose, Bld: 109 mg/dL — ABNORMAL HIGH (ref 70–99)
Potassium: 3.9 mmol/L (ref 3.5–5.1)
Sodium: 139 mmol/L (ref 135–145)
Total Bilirubin: 0.3 mg/dL (ref 0.3–1.2)
Total Protein: 6.5 g/dL (ref 6.5–8.1)

## 2020-10-04 MED ORDER — HEPARIN SOD (PORK) LOCK FLUSH 100 UNIT/ML IV SOLN
500.0000 [IU] | Freq: Once | INTRAVENOUS | Status: AC
  Administered 2020-10-04: 500 [IU] via INTRAVENOUS
  Filled 2020-10-04: qty 5

## 2020-10-04 MED ORDER — SODIUM CHLORIDE 0.9% FLUSH
10.0000 mL | INTRAVENOUS | Status: DC | PRN
  Administered 2020-10-04 (×2): 10 mL via INTRAVENOUS
  Filled 2020-10-04: qty 10

## 2020-10-04 MED ORDER — SODIUM CHLORIDE 0.9 % IV SOLN
INTRAVENOUS | Status: AC
Start: 2020-10-04 — End: 2020-10-04
  Filled 2020-10-04 (×2): qty 250

## 2020-10-04 MED ORDER — PEGFILGRASTIM-BMEZ 6 MG/0.6ML ~~LOC~~ SOSY
PREFILLED_SYRINGE | SUBCUTANEOUS | Status: AC
  Filled 2020-10-04: qty 0.6

## 2020-10-04 MED ORDER — PEGFILGRASTIM-BMEZ 6 MG/0.6ML ~~LOC~~ SOSY
6.0000 mg | PREFILLED_SYRINGE | Freq: Once | SUBCUTANEOUS | Status: AC
Start: 2020-10-04 — End: 2020-10-04
  Administered 2020-10-04: 6 mg via SUBCUTANEOUS

## 2020-10-04 NOTE — Patient Instructions (Signed)
Dehydration, Adult Dehydration is condition in which there is not enough water or other fluids in the body. This happens when a person loses more fluids than he or she takes in. Important body parts cannot work right without the right amount of fluids. Any loss of fluids from the body can cause dehydration. Dehydration can be mild, worse, or very bad. It should be treated right away to keep it from getting very bad. What are the causes? This condition may be caused by:  Conditions that cause loss of water or other fluids, such as: ? Watery poop (diarrhea). ? Vomiting. ? Sweating a lot. ? Peeing (urinating) a lot.  Not drinking enough fluids, especially when you: ? Are ill. ? Are doing things that take a lot of energy to do.  Other illnesses and conditions, such as fever or infection.  Certain medicines, such as medicines that take extra fluid out of the body (diuretics).  Lack of safe drinking water.  Not being able to get enough water and food. What increases the risk? The following factors may make you more likely to develop this condition:  Having a long-term (chronic) illness that has not been treated the right way, such as: ? Diabetes. ? Heart disease. ? Kidney disease.  Being 65 years of age or older.  Having a disability.  Living in a place that is high above the ground or sea (high in altitude). The thinner, dried air causes more fluid loss.  Doing exercises that put stress on your body for a long time. What are the signs or symptoms? Symptoms of dehydration depend on how bad it is. Mild or worse dehydration  Thirst.  Dry lips or dry mouth.  Feeling dizzy or light-headed, especially when you stand up from sitting.  Muscle cramps.  Your body making: ? Dark pee (urine). Pee may be the color of tea. ? Less pee than normal. ? Less tears than normal.  Headache. Very bad dehydration  Changes in skin. Skin may: ? Be cold to the touch (clammy). ? Be blotchy  or pale. ? Not go back to normal right after you lightly pinch it and let it go.  Little or no tears, pee, or sweat.  Changes in vital signs, such as: ? Fast breathing. ? Low blood pressure. ? Weak pulse. ? Pulse that is more than 100 beats a minute when you are sitting still.  Other changes, such as: ? Feeling very thirsty. ? Eyes that look hollow (sunken). ? Cold hands and feet. ? Being mixed up (confused). ? Being very tired (lethargic) or having trouble waking from sleep. ? Short-term weight loss. ? Loss of consciousness. How is this treated? Treatment for this condition depends on how bad it is. Treatment should start right away. Do not wait until your condition gets very bad. Very bad dehydration is an emergency. You will need to go to a hospital.  Mild or worse dehydration can be treated at home. You may be asked to: ? Drink more fluids. ? Drink an oral rehydration solution (ORS). This drink helps get the right amounts of fluids and salts and minerals in the blood (electrolytes).  Very bad dehydration can be treated: ? With fluids through an IV tube. ? By getting normal levels of salts and minerals in your blood. This is often done by giving salts and minerals through a tube. The tube is passed through your nose and into your stomach. ? By treating the root cause. Follow these instructions at   home: Oral rehydration solution If told by your doctor, drink an ORS:  Make an ORS. Use instructions on the package.  Start by drinking small amounts, about  cup (120 mL) every 5-10 minutes.  Slowly drink more until you have had the amount that your doctor said to have. Eating and drinking  Drink enough clear fluid to keep your pee pale yellow. If you were told to drink an ORS, finish the ORS first. Then, start slowly drinking other clear fluids. Drink fluids such as: ? Water. Do not drink only water. Doing that can make the salt (sodium) level in your body get too low. ? Water  from ice chips you suck on. ? Fruit juice that you have added water to (diluted). ? Low-calorie sports drinks.  Eat foods that have the right amounts of salts and minerals, such as: ? Bananas. ? Oranges. ? Potatoes. ? Tomatoes. ? Spinach.  Do not drink alcohol.  Avoid: ? Drinks that have a lot of sugar. These include:  High-calorie sports drinks.  Fruit juice that you did not add water to.  Soda.  Caffeine. ? Foods that are greasy or have a lot of fat or sugar.         General instructions  Take over-the-counter and prescription medicines only as told by your doctor.  Do not take salt tablets. Doing that can make the salt level in your body get too high.  Return to your normal activities as told by your doctor. Ask your doctor what activities are safe for you.  Keep all follow-up visits as told by your doctor. This is important. Contact a doctor if:  You have pain in your belly (abdomen) and the pain: ? Gets worse. ? Stays in one place.  You have a rash.  You have a stiff neck.  You get angry or annoyed (irritable) more easily than normal.  You are more tired or have a harder time waking than normal.  You feel: ? Weak or dizzy. ? Very thirsty. Get help right away if you have:  Any symptoms of very bad dehydration.  Symptoms of vomiting, such as: ? You cannot eat or drink without vomiting. ? Your vomiting gets worse or does not go away. ? Your vomit has blood or green stuff in it.  Symptoms that get worse with treatment.  A fever.  A very bad headache.  Problems with peeing or pooping (having a bowel movement), such as: ? Watery poop that gets worse or does not go away. ? Blood in your poop (stool). This may cause poop to look black and tarry. ? Not peeing in 6-8 hours. ? Peeing only a small amount of very dark pee in 6-8 hours.  Trouble breathing. These symptoms may be an emergency. Do not wait to see if the symptoms will go away. Get  medical help right away. Call your local emergency services (911 in the [place] Do not drive yourself to the hospital. Summary  Dehydration is a condition in which there is not enough water or other fluids in the body. This happens when a person loses more fluids than he or she takes in.  Treatment for this condition depends on how bad it is. Treatment should be started right away. Do not wait until your condition gets very bad.  Drink enough clear fluid to keep your pee pale yellow. If you were told to drink an oral rehydration solution (ORS), finish the ORS first. Then, start slowly drinking other clear fluids.    Take over-the-counter and prescription medicines only as told by your doctor.  Get help right away if you have any symptoms of very bad dehydration. This information is not intended to replace advice given to you by your health care provider. Make sure you discuss any questions you have with your health care provider. Document Revised: 01/20/2019 Document Reviewed: 01/20/2019 Elsevier Patient Education  2021 [employer]  

## 2020-10-04 NOTE — Telephone Encounter (Signed)
Scheduled per 4/12 los. Pt will receive an updated appt calendar per next visit

## 2020-10-05 ENCOUNTER — Inpatient Hospital Stay: Payer: No Typology Code available for payment source

## 2020-10-05 ENCOUNTER — Other Ambulatory Visit: Payer: Self-pay

## 2020-10-05 DIAGNOSIS — Z170 Estrogen receptor positive status [ER+]: Secondary | ICD-10-CM

## 2020-10-05 DIAGNOSIS — C50411 Malignant neoplasm of upper-outer quadrant of right female breast: Secondary | ICD-10-CM

## 2020-10-05 DIAGNOSIS — Z5111 Encounter for antineoplastic chemotherapy: Secondary | ICD-10-CM | POA: Diagnosis not present

## 2020-10-05 MED ORDER — SODIUM CHLORIDE 0.9 % IV SOLN
INTRAVENOUS | Status: AC
  Filled 2020-10-05 (×2): qty 250

## 2020-10-05 MED ORDER — SODIUM CHLORIDE 0.9% FLUSH
10.0000 mL | Freq: Once | INTRAVENOUS | Status: AC
  Administered 2020-10-05: 10 mL via INTRAVENOUS
  Filled 2020-10-05: qty 10

## 2020-10-05 MED ORDER — SODIUM CHLORIDE 0.9 % IV SOLN
INTRAVENOUS | Status: DC
  Filled 2020-10-05: qty 250

## 2020-10-05 MED ORDER — HEPARIN SOD (PORK) LOCK FLUSH 100 UNIT/ML IV SOLN
500.0000 [IU] | Freq: Once | INTRAVENOUS | Status: AC
  Administered 2020-10-05: 500 [IU] via INTRAVENOUS
  Filled 2020-10-05: qty 5

## 2020-10-05 NOTE — Progress Notes (Signed)
LATE ENTRY-When Mrs. Melissa Mcgrath came to get her Ziextenzo injection in our flush room, she told me she did not feel well and I called Dr. Jana Hakim.  Dr. Jana Hakim came to see the patient and gave a verbal order to draw a CBC, CMP, and run 500 mL over 1 hour for the patient as this made her feel better last time.  At the end of the appt, she did not feel much better and I scheduled her for fluids the next day.  MD aware and patient given appt times. Patient may need fluids on Saturday as well. Gardiner Rhyme, RN

## 2020-10-05 NOTE — Patient Instructions (Signed)
Rehydration, Adult Rehydration is the replacement of body fluids, salts, and minerals (electrolytes) that are lost during dehydration. Dehydration is when there is not enough water or other fluids in the body. This happens when you lose more fluids than you take in. Common causes of dehydration include:  Not drinking enough fluids. This can occur when you are ill or doing activities that require a lot of energy, especially in hot weather.  Conditions that cause loss of water or other fluids, such as diarrhea, vomiting, sweating, or urinating a lot.  Other illnesses, such as fever or infection.  Certain medicines, such as those that remove excess fluid from the body (diuretics). Symptoms of mild or moderate dehydration may include thirst, dry lips and mouth, and dizziness. Symptoms of severe dehydration may include increased heart rate, confusion, fainting, and not urinating. For severe dehydration, you may need to get fluids through an IV at the hospital. For mild or moderate dehydration, you can usually rehydrate at home by drinking certain fluids as told by your health care provider. What are the risks? Generally, rehydration is safe. However, taking in too much fluid (overhydration) can be a problem. This is rare. Overhydration can cause an electrolyte imbalance, kidney failure, or a decrease in salt (sodium) levels in the body. Supplies needed You will need an oral rehydration solution (ORS) if your health care provider tells you to use one. This is a drink to treat dehydration. It can be found in pharmacies and retail stores. How to rehydrate Fluids Follow instructions from your health care provider for rehydration. The kind of fluid and the amount you should drink depend on your condition. In general, you should choose drinks that you prefer.  If told by your health care provider, drink an ORS. ? Make an ORS by following instructions on the package. ? Start by drinking small amounts,  about  cup (120 mL) every 5-10 minutes. ? Slowly increase how much you drink until you have taken the amount recommended by your health care provider.  Drink enough clear fluids to keep your urine pale yellow. If you were told to drink an ORS, finish it first, then start slowly drinking other clear fluids. Drink fluids such as: ? Water. This includes sparkling water and flavored water. Drinking only water can lead to having too little sodium in your body (hyponatremia). Follow the advice of your health care provider. ? Water from ice chips you suck on. ? Fruit juice with water you add to it (diluted). ? Sports drinks. ? Hot or cold herbal teas. ? Broth-based soups. ? Milk or milk products. Food Follow instructions from your health care provider about what to eat while you rehydrate. Your health care provider may recommend that you slowly begin eating regular foods in small amounts.  Eat foods that contain a healthy balance of electrolytes, such as bananas, oranges, potatoes, tomatoes, and spinach.  Avoid foods that are greasy or contain a lot of sugar. In some cases, you may get nutrition through a feeding tube that is passed through your nose and into your stomach (nasogastric tube, or NG tube). This may be done if you have uncontrolled vomiting or diarrhea.   Beverages to avoid Certain beverages may make dehydration worse. While you rehydrate, avoid drinking alcohol.   How to tell if you are recovering from dehydration You may be recovering from dehydration if:  You are urinating more often than before you started rehydrating.  Your urine is pale yellow.  Your energy level   improves.  You vomit less frequently.  You have diarrhea less frequently.  Your appetite improves or returns to normal.  You feel less dizzy or less light-headed.  Your skin tone and color start to look more normal. Follow these instructions at home:  Take over-the-counter and prescription medicines only  as told by your health care provider.  Do not take sodium tablets. Doing this can lead to having too much sodium in your body (hypernatremia). Contact a health care provider if:  You continue to have symptoms of mild or moderate dehydration, such as: ? Thirst. ? Dry lips. ? Slightly dry mouth. ? Dizziness. ? Dark urine or less urine than normal. ? Muscle cramps.  You continue to vomit or have diarrhea. Get help right away if you:  Have symptoms of dehydration that get worse.  Have a fever.  Have a severe headache.  Have been vomiting and the following happens: ? Your vomiting gets worse or does not go away. ? Your vomit includes blood or green matter (bile). ? You cannot eat or drink without vomiting.  Have problems with urination or bowel movements, such as: ? Diarrhea that gets worse or does not go away. ? Blood in your stool (feces). This may cause stool to look black and tarry. ? Not urinating, or urinating only a small amount of very dark urine, within 6-8 hours.  Have trouble breathing.  Have symptoms that get worse with treatment. These symptoms may represent a serious problem that is an emergency. Do not wait to see if the symptoms will go away. Get medical help right away. Call your local emergency services (911 in the [place] Do not drive yourself to the hospital. Summary  Rehydration is the replacement of body fluids and minerals (electrolytes) that are lost during dehydration.  Follow instructions from your health care provider for rehydration. The kind of fluid and amount you should drink depend on your condition.  Slowly increase how much you drink until you have taken the amount recommended by your health care provider.  Contact your health care provider if you continue to show signs of mild or moderate dehydration. This information is not intended to replace advice given to you by your health care provider. Make sure you discuss any questions you have with  your health care provider. Document Revised: 08/10/2019 Document Reviewed: 06/20/2019 Elsevier Patient Education  2021 Elsevier Inc.  

## 2020-10-06 ENCOUNTER — Other Ambulatory Visit: Payer: Self-pay

## 2020-10-06 ENCOUNTER — Inpatient Hospital Stay: Payer: No Typology Code available for payment source

## 2020-10-06 VITALS — BP 102/68 | HR 70 | Temp 98.1°F | Resp 18

## 2020-10-06 DIAGNOSIS — C50411 Malignant neoplasm of upper-outer quadrant of right female breast: Secondary | ICD-10-CM

## 2020-10-06 DIAGNOSIS — Z95828 Presence of other vascular implants and grafts: Secondary | ICD-10-CM

## 2020-10-06 DIAGNOSIS — Z5111 Encounter for antineoplastic chemotherapy: Secondary | ICD-10-CM | POA: Diagnosis not present

## 2020-10-06 DIAGNOSIS — Z170 Estrogen receptor positive status [ER+]: Secondary | ICD-10-CM

## 2020-10-06 MED ORDER — SODIUM CHLORIDE 0.9 % IV SOLN
INTRAVENOUS | Status: DC
  Filled 2020-10-06 (×2): qty 250

## 2020-10-06 MED ORDER — HEPARIN SOD (PORK) LOCK FLUSH 100 UNIT/ML IV SOLN
500.0000 [IU] | Freq: Once | INTRAVENOUS | Status: AC
  Administered 2020-10-06: 500 [IU] via INTRAVENOUS
  Filled 2020-10-06: qty 5

## 2020-10-06 MED ORDER — SODIUM CHLORIDE 0.9% FLUSH
10.0000 mL | INTRAVENOUS | Status: DC | PRN
  Administered 2020-10-06: 10 mL via INTRAVENOUS
  Filled 2020-10-06: qty 10

## 2020-10-06 NOTE — Patient Instructions (Signed)
Rehydration, Adult Rehydration is the replacement of body fluids, salts, and minerals (electrolytes) that are lost during dehydration. Dehydration is when there is not enough water or other fluids in the body. This happens when you lose more fluids than you take in. Common causes of dehydration include:  Not drinking enough fluids. This can occur when you are ill or doing activities that require a lot of energy, especially in hot weather.  Conditions that cause loss of water or other fluids, such as diarrhea, vomiting, sweating, or urinating a lot.  Other illnesses, such as fever or infection.  Certain medicines, such as those that remove excess fluid from the body (diuretics). Symptoms of mild or moderate dehydration may include thirst, dry lips and mouth, and dizziness. Symptoms of severe dehydration may include increased heart rate, confusion, fainting, and not urinating. For severe dehydration, you may need to get fluids through an IV at the hospital. For mild or moderate dehydration, you can usually rehydrate at home by drinking certain fluids as told by your health care provider. What are the risks? Generally, rehydration is safe. However, taking in too much fluid (overhydration) can be a problem. This is rare. Overhydration can cause an electrolyte imbalance, kidney failure, or a decrease in salt (sodium) levels in the body. Supplies needed You will need an oral rehydration solution (ORS) if your health care provider tells you to use one. This is a drink to treat dehydration. It can be found in pharmacies and retail stores. How to rehydrate Fluids Follow instructions from your health care provider for rehydration. The kind of fluid and the amount you should drink depend on your condition. In general, you should choose drinks that you prefer.  If told by your health care provider, drink an ORS. ? Make an ORS by following instructions on the package. ? Start by drinking small amounts,  about  cup (120 mL) every 5-10 minutes. ? Slowly increase how much you drink until you have taken the amount recommended by your health care provider.  Drink enough clear fluids to keep your urine pale yellow. If you were told to drink an ORS, finish it first, then start slowly drinking other clear fluids. Drink fluids such as: ? Water. This includes sparkling water and flavored water. Drinking only water can lead to having too little sodium in your body (hyponatremia). Follow the advice of your health care provider. ? Water from ice chips you suck on. ? Fruit juice with water you add to it (diluted). ? Sports drinks. ? Hot or cold herbal teas. ? Broth-based soups. ? Milk or milk products. Food Follow instructions from your health care provider about what to eat while you rehydrate. Your health care provider may recommend that you slowly begin eating regular foods in small amounts.  Eat foods that contain a healthy balance of electrolytes, such as bananas, oranges, potatoes, tomatoes, and spinach.  Avoid foods that are greasy or contain a lot of sugar. In some cases, you may get nutrition through a feeding tube that is passed through your nose and into your stomach (nasogastric tube, or NG tube). This may be done if you have uncontrolled vomiting or diarrhea.   Beverages to avoid Certain beverages may make dehydration worse. While you rehydrate, avoid drinking alcohol.   How to tell if you are recovering from dehydration You may be recovering from dehydration if:  You are urinating more often than before you started rehydrating.  Your urine is pale yellow.  Your energy level   improves.  You vomit less frequently.  You have diarrhea less frequently.  Your appetite improves or returns to normal.  You feel less dizzy or less light-headed.  Your skin tone and color start to look more normal. Follow these instructions at home:  Take over-the-counter and prescription medicines only  as told by your health care provider.  Do not take sodium tablets. Doing this can lead to having too much sodium in your body (hypernatremia). Contact a health care provider if:  You continue to have symptoms of mild or moderate dehydration, such as: ? Thirst. ? Dry lips. ? Slightly dry mouth. ? Dizziness. ? Dark urine or less urine than normal. ? Muscle cramps.  You continue to vomit or have diarrhea. Get help right away if you:  Have symptoms of dehydration that get worse.  Have a fever.  Have a severe headache.  Have been vomiting and the following happens: ? Your vomiting gets worse or does not go away. ? Your vomit includes blood or green matter (bile). ? You cannot eat or drink without vomiting.  Have problems with urination or bowel movements, such as: ? Diarrhea that gets worse or does not go away. ? Blood in your stool (feces). This may cause stool to look black and tarry. ? Not urinating, or urinating only a small amount of very dark urine, within 6-8 hours.  Have trouble breathing.  Have symptoms that get worse with treatment. These symptoms may represent a serious problem that is an emergency. Do not wait to see if the symptoms will go away. Get medical help right away. Call your local emergency services (911 in the [place] Do not drive yourself to the hospital. Summary  Rehydration is the replacement of body fluids and minerals (electrolytes) that are lost during dehydration.  Follow instructions from your health care provider for rehydration. The kind of fluid and amount you should drink depend on your condition.  Slowly increase how much you drink until you have taken the amount recommended by your health care provider.  Contact your health care provider if you continue to show signs of mild or moderate dehydration. This information is not intended to replace advice given to you by your health care provider. Make sure you discuss any questions you have with  your health care provider. Document Revised: 08/10/2019 Document Reviewed: 06/20/2019 Elsevier Patient Education  2021 Elsevier Inc.  

## 2020-10-08 ENCOUNTER — Telehealth: Payer: Self-pay

## 2020-10-08 NOTE — Progress Notes (Signed)
[place]  [phone] [phone]     ID: Melissa Mcgrath DOB: May 29, 1961  MR#: [id number]  ZHY#:[id number]  Patient Care Team: Doree Albee, MD as PCP - General (Internal Medicine) Mauro Kaufmann, RN as Oncology Nurse Navigator Rockwell Germany, RN as Oncology Nurse Navigator Magrinat, Virgie Dad, MD as Consulting Physician (Oncology) Stark Klein, MD as Consulting Physician (General Surgery) Gery Pray, MD as Consulting Physician (Radiation Oncology) Dian Queen, MD as Consulting Physician (Obstetrics and Gynecology) Ulla Gallo, MD as Consulting Physician (Dermatology) Chauncey Cruel, MD OTHER MD:  I connected with Melissa Mcgrath on 10/09/20 at  2:15 PM EDT by video enabled telemedicine visit and verified that I am speaking with the correct person using two identifiers.   I discussed the limitations, risks, security and privacy concerns of performing an evaluation and management service by telemedicine and the availability of in-person appointments. I also discussed with the patient that there may be a patient responsible charge related to this service. The patient expressed understanding and agreed to proceed.   Other persons participating in the visit and their role in the encounter: Patient's sister  Patient's location: Home Provider's location: [place]  Total time spent: 15 min   CHIEF COMPLAINT: Estrogen receptor positive breast cancer  CURRENT TREATMENT: Neoadjuvant chemotherapy   INTERVAL HISTORY: Jermia was contacted today for follow up of her estrogen receptor positive breast cancer.  Her sister participated in today's televisit  She began neoadjuvant chemotherapy, consisting of cyclophosphamide and doxorubicin in dose dense fashion x4, on 09/18/2020.  Today is day 8 cycle 2.  She says this is "a rough week".  She received fluids for 3 days and that "held her steady".  This morning her blood  pressure was still low at 87/58.  By now it is improved, 16/57 most recently.  She is just starting to take her prophylactic antibiotics.   REVIEW OF SYSTEMS: Alexiz tells me her taste is okay but she has no appetite.  She has no mouth sores.  She is constipated.  The rash is "over with".  She remains very fatigued.  A detailed review of systems was otherwise stable   COVID 19 VACCINATION STATUS: [place] x2, most recently 01/2020, no booster as of 08/29/2020   HISTORY OF CURRENT ILLNESS: From the original intake note:  Melissa Mcgrath herself palpated an upper-outer right breast lump and noted associated intermittent pain and itching. She underwent bilateral diagnostic mammography with tomography and right breast ultrasonography at [facility] on 08/20/2020 showing: breast density category B; palpable 2.1 cm irregular mass in right breast at 9 o'clock; three abnormal-appearing right axillary tail nodes; indeterminate 4 mm oval mass located 1.8 cm lateral to dominant mass.  Accordingly on 08/21/2020 she proceeded to biopsy of the right breast area in question. The pathology from this procedure ([id number]) showed: invasive mammary carcinoma, e-cadherin positive, grade 3. Prognostic indicators significant for: estrogen receptor, 90% positive with strong staining intensity and progesterone receptor, 2% positive with weak staining intensity. Proliferation marker Ki67 at 40%. HER2 equivocal by immunohistochemistry (2+), but negative by fluorescent in situ hybridization (signals ratio and number per cell not available today).  The biopsied lymph node in the right axillary tail also showed invasive ductal carcinoma. No lymph node tissue was identified, possibly representing an entirely replaced lymph node.  Cancer Staging Malignant neoplasm of upper-outer quadrant of right breast in female, estrogen receptor positive ([place]) Staging form: Breast, AJCC 8th Edition -  Clinical stage from 08/29/2020: Stage IIIA (cT2, cN2,  cM0, G3, ER+, PR+, HER2-) - Signed by Gardenia Phlegm, NP on 09/07/2020 Stage prefix: Initial diagnosis Stage used in treatment planning: Yes National guidelines used in treatment planning: Yes Type of national guideline used in treatment planning: NCCN  The patient's subsequent history is as detailed below.   PAST MEDICAL HISTORY: Past Medical History:  Diagnosis Date  . BV (bacterial vaginosis) 10/27/2014  . Depression 01/05/2014  . Facial basal cell cancer   . Family history of breast cancer   . Family history of melanoma   . Family history of ovarian cancer   . Family history of pancreatic cancer   . Menopause 01/05/2014  . Vaginal atrophy 11/09/2014  . Vaginal itching 10/27/2014    PAST SURGICAL HISTORY: Past Surgical History:  Procedure Laterality Date  . BREAST ENHANCEMENT SURGERY    . CARPAL TUNNEL RELEASE Right   . PORTACATH PLACEMENT N/A 09/17/2020   Procedure: INSERTION PORT-A-CATH;  Surgeon: Stark Klein, MD;  Location: [facility];  Service: General;  Laterality: N/A;  . refractive lensectomy Bilateral     FAMILY HISTORY: Family History  Problem Relation Age of Onset  . Breast cancer Mother 34       breast  . Hypertension Father   . Heart attack Father   . Alzheimer's disease Father   . Fibromyalgia Sister   . Diabetes Sister   . Heart attack Sister   . Hypertension Sister   . Hypertension Brother   . Diabetes Brother   . Pancreatic cancer Maternal Grandmother 51       Pancreatic  . Melanoma Paternal Uncle        dx 39s  . Melanoma Paternal Uncle        dx 67s  . Ovarian cancer Paternal Aunt        dx 50s/60s  Her father died at age 60 from Alzheimer's. Her mother died at age 21 from metastatic breast cancer. She was initially diagnosed with breast cancer at age 38. Mikele has two brothers and one sister. In addition to her mother, she reports cancer in a paternal aunt (ovarian in mid 2's) and in her maternal grandmother (pancreatic  at age 67).    GYNECOLOGIC HISTORY:  No LMP recorded. Patient is postmenopausal. Menarche: 60 years old Age at first live birth: 60 years old [place] P 2 LMP 2010 Contraceptive: used for approx. 20 years HRT used from 11/2019 until diagnosis  Hysterectomy? no BSO? 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 Husband Herbie Baltimore "Fritz Pickerel" is retired from working for [employer]. For exercise, she walks daily, rides horses every other week, and does farm work daily. Daughter Cline Cools, age 23, is a horse trainer/instructor. Daughter Lavell Anchors, age 26, is a Copywriter, advertising in [place]. Kahlyn has one grandchild. She attends [employer].    ADVANCED DIRECTIVES: In the absence of any documentation to the contrary, the patient's spouse is their HCPOA.    HEALTH MAINTENANCE: Social History   Tobacco Use  . Smoking status: Never Smoker  . Smokeless tobacco: Never Used  Vaping Use  . Vaping Use: Never used  Substance Use Topics  . Alcohol use: No  . Drug use: No     Colonoscopy: never done  PAP: 01/2020  Bone density: never done   No Known Allergies  Current Outpatient Medications  Medication Sig Dispense Refill  . dexamethasone (DECADRON) 4 MG tablet Take  2 tablets (8 mg total) by mouth daily. Take daily for 3 days after chemo. Take with food. 30 tablet 1  . Docusate Sodium (STOOL SOFTENER) 100 MG capsule Take 2 tablets (200 mg total) by mouth 2 (two) times daily as needed for constipation. 10 tablet 0  . lidocaine-prilocaine (EMLA) cream Apply 1 application topically as needed. 30 g 1  . loratadine (CLARITIN) 10 MG tablet Take 1 tablet (10 mg total) by mouth daily. 90 tablet 0  . LORazepam (ATIVAN) 0.5 MG tablet Take 1 tablet (0.5 mg total) by mouth at bedtime as needed (Nausea or vomiting). 20 tablet 0  . magic mouthwash SOLN 5 ml swish and spit QID prn, oral tenderness 240 mL 2  . Multiple Vitamin (MULTIVITAMIN) tablet Take 1 tablet by mouth daily.     . ondansetron (ZOFRAN) 8 MG tablet Take 1 tablet (8 mg total) by mouth every 8 (eight) hours as needed for nausea or vomiting. Start day 3 after chemotherapy. 20 tablet 0  . oxyCODONE (OXY IR/ROXICODONE) 5 MG immediate release tablet Take 0.5-1 tablets (2.5-5 mg total) by mouth every 6 (six) hours as needed for severe pain. 5 tablet 0  . polyethylene glycol powder (MIRALAX) 17 GM/SCOOP powder Take 17 g by mouth daily as needed. 255 g 0  . prochlorperazine (COMPAZINE) 10 MG tablet Take 0.5 tablets (5 mg total) by mouth every 6 (six) hours as needed (Nausea or vomiting). 30 tablet 1  . triamcinolone lotion (KENALOG) 0.1 % Apply 1 application topically 3 (three) times daily. 120 mL 3  . valACYclovir (VALTREX) 1000 MG tablet Take 1 tablet (1,000 mg total) by mouth daily. 30 tablet 0   No current facility-administered medications for this visit.    OBJECTIVE: White woman who appears stated age  There were no vitals filed for this visit.   There is no height or weight on file to calculate BMI.   Wt Readings from Last 3 Encounters:  10/04/20 136 lb 12.8 oz (62.1 kg)  10/02/20 136 lb 8 oz (61.9 kg)  09/28/20 135 lb (61.2 kg)      ECOG FS:1 - Symptomatic but completely ambulatory  Telemedicine visit 10/09/2020  LAB RESULTS:  CMP     Component Value Date/Time   NA 139 10/04/2020 1240   K 3.9 10/04/2020 1240   CL 103 10/04/2020 1240   CO2 26 10/04/2020 1240   GLUCOSE 109 (H) 10/04/2020 1240   BUN 21 (H) 10/04/2020 1240   CREATININE 0.70 10/04/2020 1240   CREATININE 0.87 08/29/2020 0820   CREATININE 0.76 12/20/2013 1207   CALCIUM 8.8 (L) 10/04/2020 1240   PROT 6.5 10/04/2020 1240   ALBUMIN 3.7 10/04/2020 1240   AST 14 (L) 10/04/2020 1240   AST 12 (L) 08/29/2020 0820   ALT 18 10/04/2020 1240   ALT 12 08/29/2020 0820   ALKPHOS 77 10/04/2020 1240   BILITOT 0.3 10/04/2020 1240   BILITOT 0.6 08/29/2020 0820   GFRNONAA >60 10/04/2020 1240   GFRNONAA >60 08/29/2020 0820    No results  found for: TOTALPROTELP, ALBUMINELP, A1GS, A2GS, BETS, BETA2SER, GAMS, MSPIKE, SPEI  Lab Results  Component Value Date   WBC 9.4 10/04/2020   NEUTROABS 8.6 (H) 10/04/2020   HGB 10.2 (L) 10/04/2020   HCT 30.7 (L) 10/04/2020   MCV 95.9 10/04/2020   PLT 348 10/04/2020    No results found for: LABCA2  No components found for: BVQXIH038  No results for input(s): INR in the last 168 hours.  No  results found for: LABCA2  No results found for: MOQ947  No results found for: MLY650  No results found for: PTW656  No results found for: CA2729  No components found for: HGQUANT  No results found for: CEA1 / No results found for: CEA1   No results found for: AFPTUMOR  No results found for: CHROMOGRNA  No results found for: KPAFRELGTCHN, LAMBDASER, KAPLAMBRATIO (kappa/lambda light chains)  No results found for: HGBA, HGBA2QUANT, HGBFQUANT, HGBSQUAN (Hemoglobinopathy evaluation)   No results found for: LDH  No results found for: IRON, TIBC, IRONPCTSAT (Iron and TIBC)  No results found for: FERRITIN  Urinalysis No results found for: COLORURINE, APPEARANCEUR, LABSPEC, PHURINE, GLUCOSEU, HGBUR, BILIRUBINUR, KETONESUR, PROTEINUR, UROBILINOGEN, NITRITE, LEUKOCYTESUR   STUDIES: CT Chest W Contrast  Result Date: 09/14/2020 CLINICAL DATA:  Breast cancer staging in a 60 year old female. RIGHT-sided breast cancer by report EXAM: CT CHEST WITH CONTRAST TECHNIQUE: Multidetector CT imaging of the chest was performed during intravenous contrast administration. CONTRAST:  61m OMNIPAQUE IOHEXOL 300 MG/ML  SOLN COMPARISON:  Bone scan evaluation of the same date. FINDINGS: Cardiovascular: Normal caliber thoracic aorta. Normal heart size without substantial pericardial effusion. Central pulmonary vasculature normal caliber. Mediastinum/Nodes: Thoracic inlet structures are normal. No axillary lymphadenopathy. Mildly prominent lymph nodes are seen bilaterally without pathologic enlargement,  largest on the RIGHT approximately 7 mm on image 57 of series 2. Largest on the LEFT of similar size. Lymph nodes retain fatty hila. Is esophagus grossly normal. The no mediastinal adenopathy. No hilar adenopathy. Lungs/Pleura: Lungs are clear.  Airways are patent. Upper Abdomen: Imaged portions of liver, gallbladder, pancreas, adrenal glands and kidneys are unremarkable. Wedge-shaped area of hypoattenuation in the spleen with signs of marginal calcification along the internal margin of this area of hypodensity seen in the spleen with no adjacent stranding. Signs of a splenule adjacent to the splenic hilum. No acute gastrointestinal process on limited evaluation of the upper abdomen. Musculoskeletal: Bilateral breast implants are in place. Soft tissue thickening with biopsy marker in place overlying the RIGHT lateral breast likely the site of tumor. Small lymph node with biopsy marker in place is the node measured above in the RIGHT axilla. No suspicious bone lesion or destructive bone findings. IMPRESSION: 1. Soft tissue thickening with biopsy marker in place overlying the RIGHT lateral breast likely the site of tumor. Small lymph node with biopsy marker in place is the node measured above in the RIGHT axilla. No signs of nodal enlargement with pathologic features by CT criteria. 2. 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 Correlate with any upper abdominal symptoms. There are no priors for comparison area measures approximately 1.7 x 1.4 cm in greatest axial dimension. These results will be called to the ordering clinician or representative by the Radiologist Assistant, and communication documented in the PACS or [employer] Electronically Signed   By: GZetta BillsM.D.   On: 09/14/2020 15:15   NM Bone Scan Whole Body  Result  Date: 09/16/2020 CLINICAL DATA:  History of breast cancer. EXAM: NUCLEAR MEDICINE WHOLE BODY BONE SCAN TECHNIQUE: Whole body anterior and posterior images were obtained approximately 3 hours after intravenous injection of radiopharmaceutical. RADIOPHARMACEUTICALS:  19.8 mCi Technetium-974mDP IV COMPARISON:  CT scan of same day. FINDINGS: Minimal uptake is seen involving both knees consistent with degenerative change. No other definite areas of abnormal uptake are noted. IMPRESSION:  No definite scintigraphic evidence of osseous metastases. Electronically Signed   By: Marijo Conception M.D.   On: 09/16/2020 12:30   DG CHEST PORT 1 VIEW  Result Date: 09/17/2020 CLINICAL DATA:  Postop Port-A-Cath EXAM: PORTABLE CHEST 1 VIEW COMPARISON:  06/21/2008 CT chest 09/14/2020 FINDINGS: Left-sided central venous port tip over the SVC. No pneumothorax is visualized. There is no focal airspace disease or effusion. The cardiac size is normal IMPRESSION: Left-sided central venous port tip over the SVC. Lungs clear. No visible pneumothorax. Electronically Signed   By: Donavan Foil M.D.   On: 09/17/2020 18:55   DG Fluoro Guide CV Line-No Report  Result Date: 09/17/2020 Fluoroscopy was utilized by the requesting physician.  No radiographic interpretation.   ECHOCARDIOGRAM COMPLETE  Result Date: 09/11/2020    ECHOCARDIOGRAM REPORT   Patient Name:   Melissa Mcgrath Date of Exam: 09/11/2020 Medical Rec #:  [id number]        Height:       68.0 in Accession #:    [id number]       Weight:       135.0 lb Date of Birth:  04-15-1961        BSA:          1.729 m Patient Age:    60 years         BP:           116/58 mmHg Patient Gender: F                HR:           60 bpm. Exam Location:  Inpatient Procedure: 2D Echo Indications:    Chemo Z09  History:        Patient has no prior history of Echocardiogram examinations.  Sonographer:    Mikki Santee RDCS (AE) Referring Phys: [place]  1. Left ventricular  ejection fraction, by estimation, is 60 to 65%. The left ventricle has normal function. The left ventricle has no regional wall motion abnormalities. Left ventricular diastolic parameters are consistent with Grade I diastolic dysfunction (impaired relaxation). The average left ventricular global longitudinal strain is -19.5 %. The global longitudinal strain is normal.  2. Right ventricular systolic function is normal. The right ventricular size is normal. There is normal pulmonary artery systolic pressure.  3. The mitral valve is normal in structure. No evidence of mitral valve regurgitation. No evidence of mitral stenosis.  4. The aortic valve is tricuspid. Aortic valve regurgitation is not visualized. No aortic stenosis is present.  5. The inferior vena cava is normal in size with greater than 50% respiratory variability, suggesting right atrial pressure of 3 mmHg. FINDINGS  Left Ventricle: Left ventricular ejection fraction, by estimation, is 60 to 65%. The left ventricle has normal function. The left ventricle has no regional wall motion abnormalities. The average left ventricular global longitudinal strain is -19.5 %. The global longitudinal strain is normal. The left ventricular internal cavity size was normal in size. There is no left ventricular hypertrophy. Left ventricular diastolic parameters are consistent with Grade I diastolic dysfunction (impaired relaxation). Normal left ventricular filling pressure. Right Ventricle: The right ventricular size is normal. No increase in right ventricular wall thickness. Right ventricular systolic function is normal. There is normal pulmonary artery systolic pressure. The tricuspid regurgitant velocity is 1.69 m/s, and  with an assumed right atrial pressure of 3 mmHg, the estimated right ventricular systolic pressure is 46.5 mmHg. Left Atrium: Left atrial  size was normal in size. Right Atrium: Right atrial size was normal in size. Pericardium: There is no evidence of  pericardial effusion. Mitral Valve: The mitral valve is normal in structure. No evidence of mitral valve regurgitation. No evidence of mitral valve stenosis. Tricuspid Valve: The tricuspid valve is normal in structure. Tricuspid valve regurgitation is trivial. No evidence of tricuspid stenosis. Aortic Valve: The aortic valve is tricuspid. Aortic valve regurgitation is not visualized. No aortic stenosis is present. Pulmonic Valve: The pulmonic valve was normal in structure. Pulmonic valve regurgitation is not visualized. No evidence of pulmonic stenosis. Aorta: The aortic root is normal in size and structure. Venous: The inferior vena cava is normal in size with greater than 50% respiratory variability, suggesting right atrial pressure of 3 mmHg. IAS/Shunts: No atrial level shunt detected by color flow Doppler.  LEFT VENTRICLE PLAX 2D LVIDd:         4.60 cm  Diastology LVIDs:         2.80 cm  LV e' medial:    8.81 cm/s LV PW:         0.80 cm  LV E/e' medial:  5.8 LV IVS:        0.70 cm  LV e' lateral:   12.80 cm/s LVOT diam:     2.10 cm  LV E/e' lateral: 4.0 LV SV:         68 LV SV Index:   39       2D Longitudinal Strain LVOT Area:     3.46 cm 2D Strain GLS (A2C):   -18.8 %                         2D Strain GLS (A3C):   -21.0 %                         2D Strain GLS (A4C):   -18.8 %                         2D Strain GLS Avg:     -19.5 % RIGHT VENTRICLE RV S prime:     12.60 cm/s TAPSE (M-mode): 2.3 cm LEFT ATRIUM             Index       RIGHT ATRIUM           Index LA diam:        2.70 cm 1.56 cm/m  RA Area:     14.00 cm LA Vol (A2C):   28.9 ml 16.71 ml/m RA Volume:   36.10 ml  20.87 ml/m LA Vol (A4C):   30.6 ml 17.69 ml/m LA Biplane Vol: 29.7 ml 17.17 ml/m  AORTIC VALVE LVOT Vmax:   90.20 cm/s LVOT Vmean:  56.100 cm/s LVOT VTI:    0.197 m  AORTA Ao Root diam: 2.80 cm MITRAL VALVE               TRICUSPID VALVE MV Area (PHT): 2.07 cm    TR Peak grad:   11.4 mmHg MV Decel Time: 366 msec    TR Vmax:         169.00 cm/s MV E velocity: 51.40 cm/s MV A velocity: 66.80 cm/s  SHUNTS MV E/A ratio:  0.77        Systemic VTI:  0.20 m  Systemic Diam: 2.10 cm Skeet Latch MD Electronically signed by Skeet Latch MD Signature Date/Time: 09/11/2020/12:22:18 PM    Final      ELIGIBLE FOR AVAILABLE RESEARCH PROTOCOL: no  ASSESSMENT: 60 y.o. [place] woman status post right breast upper outer quadrant biopsy 08/21/2020 for a clinical mT2 N1, stage IIA/B invasive ductal carcinoma, grade 3, estrogen receptor positive, progesterone receptor weakly positive, HER-2 not amplified, with an MIB-1 of 40%  (a) breast MRI 09/05/2020 shows a clinical T2 N2, stage IIIA disease  (b) bone scan and chest CT scan 09/14/2020 show no evidence of metastatic disease  (1) genetics testing 09/18/2020 through the   CancerNext-Expanded + RNAinsight gene panel offered by [employer] found no deleterious mutations in AIP, ALK, APC, ATM, AXIN2, BAP1, BARD1, BLM, BMPR1A, BRCA1, BRCA2, BRIP1, CDC73, CDH1, CDK4, CDKN1B, CDKN2A, CHEK2, CTNNA1, DICER1, FANCC, FH, FLCN, GALNT12, KIF1B, LZTR1, MAX, MEN1, MET, MLH1, MSH2, MSH3, MSH6, MUTYH, NBN, NF1, NF2, NTHL1, PALB2, PHOX2B, PMS2, POT1, PRKAR1A, PTCH1, PTEN, RAD51C, RAD51D, RB1, RECQL, RET, SDHA, SDHAF2, SDHB, SDHC, SDHD, SMAD4, SMARCA4, SMARCB1, SMARCE1, STK11, SUFU, TMEM127, TP53, TSC1, TSC2, VHL and XRCC2 (sequencing and deletion/duplication); EGFR, EGLN1, HOXB13, KIT, MITF, PDGFRA, POLD1 and POLE (sequencing only); EPCAM and GREM1 (deletion/duplication only). RNA data is routinely analyzed for use in variant interpretation for all genes.   (2) neoadjuvant chemotherapy will consist of cyclophosphamide and doxorubicin in dose dense fashion x4 starting 09/18/2020, to be followed by weekly paclitaxel x12  (a) echo 09/11/2020 shows an ejection fraction in the 60-65% range  (3) definitive surgery to follow  (4) adjuvant radiation  (5) adjuvant  antiestrogens   PLAN: Quaneshia barely managed to get through the first 2 cycles and she is still really is only beginning to recover from cycle 2.  I think it would be prudent to give her an extra week in between treatments at least between treatments #2 and 3.  She is very agreeable to this.  Accordingly instead of being treated on 5 / 3 her treatment will be on 10/30/2020.  I have moved all the subsequent dates accordingly.  I will see her with the next treatment and we will provide additional IV fluids as necessary  Sarajane Jews C. Magrinat, MD 10/09/2020 5:32 PM Medical Oncology and Hematology [facility] [place], [place] [address] Tel. [phone]    Fax. [phone]   This document serves as a record of services personally performed by Lurline Del, MD. It was created on his behalf by [name], a trained medical scribe. The creation of this record is based on the scribe's personal observations and the provider's statements to them.   I, Lurline Del MD, have reviewed the above documentation for accuracy and completeness, and I agree with the above.   *Total Encounter Time as defined by the Centers for Medicare and Medicaid Services includes, in addition to the face-to-face time of a patient visit (documented in the note above) non-face-to-face time: obtaining and reviewing outside history, ordering and reviewing medications, tests or procedures, care coordination (communications with other health care professionals or caregivers) and documentation in the medical record.

## 2020-10-08 NOTE — Telephone Encounter (Signed)
Pt called stating she is unsure if she will be able to swallow Augmentin prescribed, as she is not able to eat and this pill is "too big." This LPN advised pt that as long as it is not XR, it can be crushed and put in pudding or apple sauce. Pt verbalized thanks and stated she would let Dr Jana Hakim know how she is feeling tomorrow during her video visit.

## 2020-10-09 ENCOUNTER — Inpatient Hospital Stay (HOSPITAL_BASED_OUTPATIENT_CLINIC_OR_DEPARTMENT_OTHER): Payer: No Typology Code available for payment source | Admitting: Oncology

## 2020-10-09 DIAGNOSIS — C50411 Malignant neoplasm of upper-outer quadrant of right female breast: Secondary | ICD-10-CM | POA: Diagnosis not present

## 2020-10-09 DIAGNOSIS — Z170 Estrogen receptor positive status [ER+]: Secondary | ICD-10-CM | POA: Diagnosis not present

## 2020-10-10 ENCOUNTER — Encounter: Payer: Self-pay | Admitting: Oncology

## 2020-10-12 ENCOUNTER — Encounter: Payer: Self-pay | Admitting: Oncology

## 2020-10-15 ENCOUNTER — Telehealth: Payer: Self-pay | Admitting: Oncology

## 2020-10-15 NOTE — Telephone Encounter (Signed)
Scheduled per 4/22 staff msg. Called and left pt a msg

## 2020-10-16 ENCOUNTER — Ambulatory Visit: Payer: No Typology Code available for payment source | Admitting: Oncology

## 2020-10-16 ENCOUNTER — Other Ambulatory Visit: Payer: No Typology Code available for payment source

## 2020-10-16 ENCOUNTER — Ambulatory Visit: Payer: No Typology Code available for payment source

## 2020-10-18 ENCOUNTER — Ambulatory Visit: Payer: No Typology Code available for payment source

## 2020-10-18 ENCOUNTER — Telehealth: Payer: Self-pay | Admitting: Oncology

## 2020-10-18 NOTE — Telephone Encounter (Signed)
Scheduled per 4/21 los. Called pt and left a msg

## 2020-10-24 NOTE — Progress Notes (Signed)
[place]  [phone] [phone]) [phone]     ID: Melissa Mcgrath DOB: 22-Sep-1960  MR#: [id number]  QMV#:[id number]  Patient Care Team: Doree Albee, MD as PCP - General (Internal Medicine) Mauro Kaufmann, RN as Oncology Nurse Navigator Rockwell Germany, RN as Oncology Nurse Navigator Divinity Kyler, Virgie Dad, MD as Consulting Physician (Oncology) Stark Klein, MD as Consulting Physician (General Surgery) Gery Pray, MD as Consulting Physician (Radiation Oncology) Dian Queen, MD as Consulting Physician (Obstetrics and Gynecology) Ulla Gallo, MD as Consulting Physician (Dermatology) Chauncey Cruel, MD OTHER MD:   CHIEF COMPLAINT: Estrogen receptor positive breast cancer  CURRENT TREATMENT: Neoadjuvant chemotherapy   INTERVAL HISTORY: Melissa Mcgrath returns today for follow up and treatment of her estrogen receptor positive breast cancer.  She is accompanied by her mother  She began neoadjuvant chemotherapy, consisting of cyclophosphamide and doxorubicin in dose dense fashion x4, on 09/18/2020.  We delayed cycle 3, which will be given today, because she had a great deal of difficulty tolerating the first 2 cycles.    REVIEW OF SYSTEMS: Melissa Mcgrath is feeling considerably better.  She has gained weight.  She is very active with all her animals.  She does have problems with allergies and is taking Zyrtec with some success.  She was able to visit her sister in [place], for staying at the [place] and greatly enjoyed that.  She feels ready to get back under treatment but does think it would be better if her final cycle would be given 3 weeks from now rather than 2.     COVID 19 VACCINATION STATUS: [place] x2, most recently 01/2020, no booster as of 08/29/2020   HISTORY OF CURRENT ILLNESS: From the original intake note:  Melissa Mcgrath herself palpated an upper-outer right breast lump and noted associated intermittent pain and  itching. She underwent bilateral diagnostic mammography with tomography and right breast ultrasonography at [facility] on 08/20/2020 showing: breast density category B; palpable 2.1 cm irregular mass in right breast at 9 o'clock; three abnormal-appearing right axillary tail nodes; indeterminate 4 mm oval mass located 1.8 cm lateral to dominant mass.  Accordingly on 08/21/2020 she proceeded to biopsy of the right breast area in question. The pathology from this procedure ([id number]) showed: invasive mammary carcinoma, e-cadherin positive, grade 3. Prognostic indicators significant for: estrogen receptor, 90% positive with strong staining intensity and progesterone receptor, 2% positive with weak staining intensity. Proliferation marker Ki67 at 40%. HER2 equivocal by immunohistochemistry (2+), but negative by fluorescent in situ hybridization (signals ratio and number per cell not available today).  The biopsied lymph node in the right axillary tail also showed invasive ductal carcinoma. No lymph node tissue was identified, possibly representing an entirely replaced lymph node.  Cancer Staging Malignant neoplasm of upper-outer quadrant of right breast in female, estrogen receptor positive ([place]) Staging form: Breast, AJCC 8th Edition - Clinical stage from 08/29/2020: Stage IIIA (cT2, cN2, cM0, G3, ER+, PR+, HER2-) - Signed by Gardenia Phlegm, NP on 09/07/2020 Stage prefix: Initial diagnosis Stage used in treatment planning: Yes National guidelines used in treatment planning: Yes Type of national guideline used in treatment planning: NCCN  The patient's subsequent history is as detailed below.   PAST MEDICAL HISTORY: Past Medical History:  Diagnosis Date  . BV (bacterial vaginosis) 10/27/2014  . Depression 01/05/2014  . Facial basal cell cancer   . Family history of breast cancer   . Family history of melanoma   .  Family history of ovarian cancer   . Family history of pancreatic cancer   .  Menopause 01/05/2014  . Vaginal atrophy 11/09/2014  . Vaginal itching 10/27/2014    PAST SURGICAL HISTORY: Past Surgical History:  Procedure Laterality Date  . BREAST ENHANCEMENT SURGERY    . CARPAL TUNNEL RELEASE Right   . PORTACATH PLACEMENT N/A 09/17/2020   Procedure: INSERTION PORT-A-CATH;  Surgeon: Stark Klein, MD;  Location: [facility];  Service: General;  Laterality: N/A;  . refractive lensectomy Bilateral     FAMILY HISTORY: Family History  Problem Relation Age of Onset  . Breast cancer Mother 40       breast  . Hypertension Father   . Heart attack Father   . Alzheimer's disease Father   . Fibromyalgia Sister   . Diabetes Sister   . Heart attack Sister   . Hypertension Sister   . Hypertension Brother   . Diabetes Brother   . Pancreatic cancer Maternal Grandmother 29       Pancreatic  . Melanoma Paternal Uncle        dx 64s  . Melanoma Paternal Uncle        dx 61s  . Ovarian cancer Paternal Aunt        dx 50s/60s  Her father died at age 69 from Alzheimer's. Her mother died at age 20 from metastatic breast cancer. She was initially diagnosed with breast cancer at age 74. Melissa Mcgrath has two brothers and one sister. In addition to her mother, she reports cancer in a paternal aunt (ovarian in mid 10's) and in her maternal grandmother (pancreatic at age 47).    GYNECOLOGIC HISTORY:  No LMP recorded. Patient is postmenopausal. Menarche: 60 years old Age at first live birth: 60 years old [place] P 2 LMP 2010 Contraceptive: used for approx. 20 years HRT used from 11/2019 until diagnosis  Hysterectomy? no BSO? 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 Husband Herbie Baltimore "Fritz Pickerel" is retired from working for [employer]. For exercise, she walks daily, rides horses every other week, and does farm work daily. Daughter Melissa Mcgrath, age 96, is a horse trainer/instructor. Daughter Melissa Mcgrath, age 48, is a Copywriter, advertising in [place]. Melissa Mcgrath has one  grandchild. She attends [employer].    ADVANCED DIRECTIVES: In the absence of any documentation to the contrary, the patient's spouse is their HCPOA.    HEALTH MAINTENANCE: Social History   Tobacco Use  . Smoking status: Never Smoker  . Smokeless tobacco: Never Used  Vaping Use  . Vaping Use: Never used  Substance Use Topics  . Alcohol use: No  . Drug use: No     Colonoscopy: never done  PAP: 01/2020  Bone density: never done   No Known Allergies  Current Outpatient Medications  Medication Sig Dispense Refill  . dexamethasone (DECADRON) 4 MG tablet Take 2 tablets (8 mg total) by mouth daily. Take daily for 3 days after chemo. Take with food. 30 tablet 1  . Docusate Sodium (STOOL SOFTENER) 100 MG capsule Take 2 tablets (200 mg total) by mouth 2 (two) times daily as needed for constipation. 10 tablet 0  . lidocaine-prilocaine (EMLA) cream Apply 1 application topically as needed. 30 g 1  . loratadine (CLARITIN) 10 MG tablet Take 1 tablet (10 mg total) by mouth daily. 90 tablet 0  . LORazepam (ATIVAN) 0.5 MG tablet Take 1 tablet (0.5 mg total) by mouth at bedtime as needed (Nausea  or vomiting). 20 tablet 0  . magic mouthwash SOLN 5 ml swish and spit QID prn, oral tenderness 240 mL 2  . Multiple Vitamin (MULTIVITAMIN) tablet Take 1 tablet by mouth daily.    . ondansetron (ZOFRAN) 8 MG tablet Take 1 tablet (8 mg total) by mouth every 8 (eight) hours as needed for nausea or vomiting. Start day 3 after chemotherapy. 20 tablet 0  . oxyCODONE (OXY IR/ROXICODONE) 5 MG immediate release tablet Take 0.5-1 tablets (2.5-5 mg total) by mouth every 6 (six) hours as needed for severe pain. 5 tablet 0  . polyethylene glycol powder (MIRALAX) 17 GM/SCOOP powder Take 17 g by mouth daily as needed. 255 g 0  . prochlorperazine (COMPAZINE) 10 MG tablet Take 0.5 tablets (5 mg total) by mouth every 6 (six) hours as needed (Nausea or vomiting). 30 tablet 1  . triamcinolone lotion (KENALOG) 0.1 %  Apply 1 application topically 3 (three) times daily. 120 mL 3  . valACYclovir (VALTREX) 1000 MG tablet Take 1 tablet (1,000 mg total) by mouth daily. 30 tablet 0   No current facility-administered medications for this visit.    OBJECTIVE: White woman in no acute distress  Vitals:   10/25/20 1353  BP: (!) 102/54  Pulse: 75  Resp: 17  Temp: (!) 97 F (36.1 C)  SpO2: 100%     Body mass index is 20.45 kg/m.   Wt Readings from Last 3 Encounters:  10/25/20 134 lb 8 oz (61 kg)  10/04/20 136 lb 12.8 oz (62.1 kg)  10/02/20 136 lb 8 oz (61.9 kg)      ECOG FS:1 - Symptomatic but completely ambulatory  Sclerae unicteric, EOMs intact Wearing a mask No cervical or supraclavicular adenopathy Lungs no rales or rhonchi Heart regular rate and rhythm Abd soft, nontender, positive bowel sounds MSK no focal spinal tenderness, no upper extremity lymphedema Neuro: nonfocal, well oriented, appropriate affect Breasts: I can still palpate the right breast mass at approximately 930 o'clock.  It is movable, not very firm, and actually may be a part of her implant rather than the breast mass.  Both axillae are benign.   LAB RESULTS:  CMP     Component Value Date/Time   NA 139 10/04/2020 1240   K 3.9 10/04/2020 1240   CL 103 10/04/2020 1240   CO2 26 10/04/2020 1240   GLUCOSE 109 (H) 10/04/2020 1240   BUN 21 (H) 10/04/2020 1240   CREATININE 0.70 10/04/2020 1240   CREATININE 0.87 08/29/2020 0820   CREATININE 0.76 12/20/2013 1207   CALCIUM 8.8 (L) 10/04/2020 1240   PROT 6.5 10/04/2020 1240   ALBUMIN 3.7 10/04/2020 1240   AST 14 (L) 10/04/2020 1240   AST 12 (L) 08/29/2020 0820   ALT 18 10/04/2020 1240   ALT 12 08/29/2020 0820   ALKPHOS 77 10/04/2020 1240   BILITOT 0.3 10/04/2020 1240   BILITOT 0.6 08/29/2020 0820   GFRNONAA >60 10/04/2020 1240   GFRNONAA >60 08/29/2020 0820    No results found for: TOTALPROTELP, ALBUMINELP, A1GS, A2GS, BETS, BETA2SER, GAMS, MSPIKE, SPEI  Lab Results   Component Value Date   WBC 6.2 10/25/2020   NEUTROABS 4.0 10/25/2020   HGB 10.8 (L) 10/25/2020   HCT 32.7 (L) 10/25/2020   MCV 97.6 10/25/2020   PLT 343 10/25/2020    No results found for: LABCA2  No components found for: [id number]  No results for input(s): INR in the last 168 hours.  No results found for: LABCA2  No results  found for: PXT062  No results found for: IRS854  No results found for: OEV035  No results found for: CA2729  No components found for: HGQUANT  No results found for: CEA1 / No results found for: CEA1   No results found for: AFPTUMOR  No results found for: CHROMOGRNA  No results found for: KPAFRELGTCHN, LAMBDASER, KAPLAMBRATIO (kappa/lambda light chains)  No results found for: HGBA, HGBA2QUANT, HGBFQUANT, HGBSQUAN (Hemoglobinopathy evaluation)   No results found for: LDH  No results found for: IRON, TIBC, IRONPCTSAT (Iron and TIBC)  No results found for: FERRITIN  Urinalysis No results found for: COLORURINE, APPEARANCEUR, LABSPEC, PHURINE, GLUCOSEU, HGBUR, BILIRUBINUR, KETONESUR, PROTEINUR, UROBILINOGEN, NITRITE, LEUKOCYTESUR   STUDIES: No results found.   ELIGIBLE FOR AVAILABLE RESEARCH PROTOCOL: no  ASSESSMENT: 60 y.o. [place] woman status post right breast upper outer quadrant biopsy 08/21/2020 for a clinical mT2 N1, stage IIA/B invasive ductal carcinoma, grade 3, estrogen receptor positive, progesterone receptor weakly positive, HER-2 not amplified, with an MIB-1 of 40%  (a) breast MRI 09/05/2020 shows a clinical T2 N2, stage IIIA disease  (b) bone scan and chest CT scan 09/14/2020 show no evidence of metastatic disease  (1) genetics testing 09/18/2020 through the   CancerNext-Expanded + RNAinsight gene panel offered by [facility] found no deleterious mutations in AIP, ALK, APC, ATM, AXIN2, BAP1, BARD1, BLM, BMPR1A, BRCA1, BRCA2, BRIP1, CDC73, CDH1, CDK4, CDKN1B, CDKN2A, CHEK2, CTNNA1, DICER1, FANCC, FH, FLCN, GALNT12,  KIF1B, LZTR1, MAX, MEN1, MET, MLH1, MSH2, MSH3, MSH6, MUTYH, NBN, NF1, NF2, NTHL1, PALB2, PHOX2B, PMS2, POT1, PRKAR1A, PTCH1, PTEN, RAD51C, RAD51D, RB1, RECQL, RET, SDHA, SDHAF2, SDHB, SDHC, SDHD, SMAD4, SMARCA4, SMARCB1, SMARCE1, STK11, SUFU, TMEM127, TP53, TSC1, TSC2, VHL and XRCC2 (sequencing and deletion/duplication); EGFR, EGLN1, HOXB13, KIT, MITF, PDGFRA, POLD1 and POLE (sequencing only); EPCAM and GREM1 (deletion/duplication only). RNA data is routinely analyzed for use in variant interpretation for all genes.   (2) neoadjuvant chemotherapy consisting of cyclophosphamide and doxorubicin in dose dense fashion x4 starting 09/18/2020, to be followed by weekly paclitaxel x12  (a) echo 09/11/2020 shows an ejection fraction in the 60-65% range  (b) cycles 3 and 4 were each delayed by 1 week given multiple side effects.  (3) definitive surgery to follow  (4) adjuvant radiation  (5) adjuvant antiestrogens   PLAN: Melissa Mcgrath has made a very good recovery from her initial chemotherapy and is ready to proceed to treatment.  I do think she will benefit from waiting 3 weeks before giving her the final cycle as she had a very hard time being treated 14 days after cycle 1.  She will also benefit from intravenous fluids which can be given days 3 4 and 6 after this cycle.  She has a good understanding of how to manage side effects and prophylax for vomiting and other complications.  I have encouraged her to call us with any questions or concerns.  She will return to see me in 3 weeks for her final AC cycle  Total encounter time 25 minutes.Sarajane Jews C. Wesson Stith, MD 10/25/2020 2:04 PM Medical Oncology and Hematology [facility] [place], [place] [address] Tel. [phone]    Fax. [phone]   This document serves as a record of services personally performed by Lurline Del, MD. It was created on his behalf by [name], a trained medical scribe. The creation of  this record is based on the scribe's personal observations and the provider's statements to them.   Lindie Spruce MD,  have reviewed the above documentation for accuracy and completeness, and I agree with the above.   *Total Encounter Time as defined by the Centers for Medicare and Medicaid Services includes, in addition to the face-to-face time of a patient visit (documented in the note above) non-face-to-face time: obtaining and reviewing outside history, ordering and reviewing medications, tests or procedures, care coordination (communications with other health care professionals or caregivers) and documentation in the medical record.

## 2020-10-25 ENCOUNTER — Other Ambulatory Visit: Payer: Self-pay

## 2020-10-25 ENCOUNTER — Inpatient Hospital Stay: Payer: No Typology Code available for payment source

## 2020-10-25 ENCOUNTER — Inpatient Hospital Stay: Payer: No Typology Code available for payment source | Attending: Oncology | Admitting: Oncology

## 2020-10-25 VITALS — BP 102/54 | HR 75 | Temp 97.0°F | Resp 17 | Wt 134.5 lb

## 2020-10-25 DIAGNOSIS — Z5111 Encounter for antineoplastic chemotherapy: Secondary | ICD-10-CM | POA: Diagnosis not present

## 2020-10-25 DIAGNOSIS — C50411 Malignant neoplasm of upper-outer quadrant of right female breast: Secondary | ICD-10-CM | POA: Insufficient documentation

## 2020-10-25 DIAGNOSIS — Z452 Encounter for adjustment and management of vascular access device: Secondary | ICD-10-CM | POA: Insufficient documentation

## 2020-10-25 DIAGNOSIS — Z95828 Presence of other vascular implants and grafts: Secondary | ICD-10-CM

## 2020-10-25 DIAGNOSIS — Z170 Estrogen receptor positive status [ER+]: Secondary | ICD-10-CM

## 2020-10-25 DIAGNOSIS — Z5189 Encounter for other specified aftercare: Secondary | ICD-10-CM | POA: Insufficient documentation

## 2020-10-25 LAB — COMPREHENSIVE METABOLIC PANEL
ALT: 18 U/L (ref 0–44)
AST: 21 U/L (ref 15–41)
Albumin: 4 g/dL (ref 3.5–5.0)
Alkaline Phosphatase: 63 U/L (ref 38–126)
Anion gap: 10 (ref 5–15)
BUN: 14 mg/dL (ref 6–20)
CO2: 28 mmol/L (ref 22–32)
Calcium: 9.5 mg/dL (ref 8.9–10.3)
Chloride: 107 mmol/L (ref 98–111)
Creatinine, Ser: 0.77 mg/dL (ref 0.44–1.00)
GFR, Estimated: >60 mL/min (ref >60)
Glucose, Bld: 94 mg/dL (ref 70–99)
Potassium: 4.4 mmol/L (ref 3.5–5.1)
Sodium: 145 mmol/L (ref 135–145)
Total Bilirubin: 0.3 mg/dL (ref 0.3–1.2)
Total Protein: 6.9 g/dL (ref 6.5–8.1)

## 2020-10-25 LAB — CBC WITH DIFFERENTIAL/PLATELET
Abs Immature Granulocytes: 0.05 K/uL (ref 0.00–0.07)
Basophils Absolute: 0.2 K/uL — ABNORMAL HIGH (ref 0.0–0.1)
Basophils Relative: 2 %
Eosinophils Absolute: 0 K/uL (ref 0.0–0.5)
Eosinophils Relative: 0 %
HCT: 32.7 % — ABNORMAL LOW (ref 36.0–46.0)
Hemoglobin: 10.8 g/dL — ABNORMAL LOW (ref 12.0–15.0)
Immature Granulocytes: 1 %
Lymphocytes Relative: 16 %
Lymphs Abs: 1 K/uL (ref 0.7–4.0)
MCH: 32.2 pg (ref 26.0–34.0)
MCHC: 33 g/dL (ref 30.0–36.0)
MCV: 97.6 fL (ref 80.0–100.0)
Monocytes Absolute: 1 K/uL (ref 0.1–1.0)
Monocytes Relative: 16 %
Neutro Abs: 4 K/uL (ref 1.7–7.7)
Neutrophils Relative %: 65 %
Platelets: 343 K/uL (ref 150–400)
RBC: 3.35 MIL/uL — ABNORMAL LOW (ref 3.87–5.11)
RDW: 14.8 % (ref 11.5–15.5)
WBC: 6.2 K/uL (ref 4.0–10.5)
nRBC: 0 % (ref 0.0–0.2)

## 2020-10-25 MED ORDER — PALONOSETRON HCL INJECTION 0.25 MG/5ML
0.2500 mg | Freq: Once | INTRAVENOUS | Status: AC
Start: 2020-10-25 — End: 2020-10-25
  Administered 2020-10-25: 0.25 mg via INTRAVENOUS

## 2020-10-25 MED ORDER — HEPARIN SOD (PORK) LOCK FLUSH 100 UNIT/ML IV SOLN
500.0000 [IU] | Freq: Once | INTRAVENOUS | Status: AC | PRN
  Administered 2020-10-25: 500 [IU]
  Filled 2020-10-25: qty 5

## 2020-10-25 MED ORDER — SODIUM CHLORIDE 0.9% FLUSH
10.0000 mL | INTRAVENOUS | Status: DC | PRN
  Administered 2020-10-25: 10 mL
  Filled 2020-10-25: qty 10

## 2020-10-25 MED ORDER — ALTEPLASE 2 MG IJ SOLR
INTRAMUSCULAR | Status: AC
  Filled 2020-10-25: qty 2

## 2020-10-25 MED ORDER — ALTEPLASE 2 MG IJ SOLR
2.0000 mg | Freq: Once | INTRAMUSCULAR | Status: AC
  Administered 2020-10-25: 2 mg
  Filled 2020-10-25: qty 2

## 2020-10-25 MED ORDER — PALONOSETRON HCL INJECTION 0.25 MG/5ML
INTRAVENOUS | Status: AC
  Filled 2020-10-25: qty 5

## 2020-10-25 MED ORDER — SODIUM CHLORIDE 0.9 % IV SOLN
600.0000 mg/m2 | Freq: Once | INTRAVENOUS | Status: AC
  Administered 2020-10-25: 1040 mg via INTRAVENOUS
  Filled 2020-10-25: qty 52

## 2020-10-25 MED ORDER — DOXORUBICIN HCL CHEMO IV INJECTION 2 MG/ML
60.0000 mg/m2 | Freq: Once | INTRAVENOUS | Status: AC
  Administered 2020-10-25: 104 mg via INTRAVENOUS
  Filled 2020-10-25: qty 52

## 2020-10-25 MED ORDER — SODIUM CHLORIDE 0.9 % IV SOLN
10.0000 mg | Freq: Once | INTRAVENOUS | Status: AC
  Administered 2020-10-25: 10 mg via INTRAVENOUS
  Filled 2020-10-25: qty 10

## 2020-10-25 MED ORDER — SODIUM CHLORIDE 0.9% FLUSH
10.0000 mL | Freq: Once | INTRAVENOUS | Status: AC
  Administered 2020-10-25: 10 mL
  Filled 2020-10-25: qty 10

## 2020-10-25 MED ORDER — SODIUM CHLORIDE 0.9 % IV SOLN
Freq: Once | INTRAVENOUS | Status: AC
  Filled 2020-10-25: qty 250

## 2020-10-25 MED ORDER — SODIUM CHLORIDE 0.9 % IV SOLN
10.0000 mg | Freq: Once | INTRAVENOUS | Status: DC
  Filled 2020-10-25: qty 1

## 2020-10-25 MED ORDER — FOSAPREPITANT DIMEGLUMINE INJECTION 150 MG
150.0000 mg | Freq: Once | INTRAVENOUS | Status: AC
  Administered 2020-10-25: 150 mg via INTRAVENOUS
  Filled 2020-10-25: qty 150

## 2020-10-25 NOTE — Progress Notes (Signed)
Unable to get blood return from pt's port. Cathflo was given at 1334 by Stanton Kidney, RN. Doctor made aware.

## 2020-10-25 NOTE — Patient Instructions (Signed)
Implanted Port Insertion, Care After This sheet gives you information about how to care for yourself after your procedure. Your health care provider may also give you more specific instructions. If you have problems or questions, contact your health care provider. What can I expect after the procedure? After the procedure, it is common to have:  Discomfort at the port insertion site.  Bruising on the skin over the port. This should improve over 3-4 days. Follow these instructions at home: Port care  After your port is placed, you will get a manufacturer's information card. The card has information about your port. Keep this card with you at all times.  Take care of the port as told by your health care provider. Ask your health care provider if you or a family member can get training for taking care of the port at home. A home health care nurse may also take care of the port.  Make sure to remember what type of port you have. Incision care  Follow instructions from your health care provider about how to take care of your port insertion site. Make sure you: ? Wash your hands with soap and water before and after you change your bandage (dressing). If soap and water are not available, use hand sanitizer. ? Change your dressing as told by your health care provider. ? Leave stitches (sutures), skin glue, or adhesive strips in place. These skin closures may need to stay in place for 2 weeks or longer. If adhesive strip edges start to loosen and curl up, you may trim the loose edges. Do not remove adhesive strips completely unless your health care provider tells you to do that.  Check your port insertion site every day for signs of infection. Check for: ? Redness, swelling, or pain. ? Fluid or blood. ? Warmth. ? Pus or a bad smell.      Activity  Return to your normal activities as told by your health care provider. Ask your health care provider what activities are safe for you.  Do not  lift anything that is heavier than 10 lb (4.5 kg), or the limit that you are told, until your health care provider says that it is safe. General instructions  Take over-the-counter and prescription medicines only as told by your health care provider.  Do not take baths, swim, or use a hot tub until your health care provider approves. Ask your health care provider if you may take showers. You may only be allowed to take sponge baths.  Do not drive for 24 hours if you were given a sedative during your procedure.  Wear a medical alert bracelet in case of an emergency. This will tell any health care providers that you have a port.  Keep all follow-up visits as told by your health care provider. This is important. Contact a health care provider if:  You cannot flush your port with saline as directed, or you cannot draw blood from the port.  You have a fever or chills.  You have redness, swelling, or pain around your port insertion site.  You have fluid or blood coming from your port insertion site.  Your port insertion site feels warm to the touch.  You have pus or a bad smell coming from the port insertion site. Get help right away if:  You have chest pain or shortness of breath.  You have bleeding from your port that you cannot control. Summary  Take care of the port as told by your   health care provider. Keep the manufacturer's information card with you at all times.  Change your dressing as told by your health care provider.  Contact a health care provider if you have a fever or chills or if you have redness, swelling, or pain around your port insertion site.  Keep all follow-up visits as told by your health care provider. This information is not intended to replace advice given to you by your health care provider. Make sure you discuss any questions you have with your health care provider. Document Revised: 01/05/2018 Document Reviewed: 01/05/2018 Elsevier Patient Education   2021 Elsevier Inc.  

## 2020-10-25 NOTE — Patient Instructions (Signed)
[facility] MEDICAL [facility]COLOG[facility]  Discharge Instructions: °Thank you for choosing [facility] to provide your oncology and hematology care.  ° °If you have a lab appointment with the Cancer Center, please go directly to the Cancer Center and check in at the registration area. °  °Wear comfortable clothing and clothing appropriate for easy access to any Portacath or PICC line.  ° °We strive to give you quality time with your provider. You may need to reschedule your appointment if you arrive late (15 or more minutes).  Arriving late affects you and other patients whose appointments are after yours.  Also, if you miss three or more appointments without notifying the office, you may be dismissed from the clinic at the provider’s discretion.    °  °For prescription refill requests, have your pharmacy contact our office and allow 72 hours for refills to be completed.   ° °Today you received the following chemotherapy and/or immunotherapy agents: Adriamycin and Cytoxan    °  °To help prevent nausea and vomiting after your treatment, we encourage you to take your nausea medication as directed. ° °BELOW ARE SYMPTOMS THAT SHOULD BE REPORTED IMMEDIATELY: °*FEVER GREATER THAN 100.4 F (38 °C) OR HIGHER °*CHILLS OR SWEATING °*NAUSEA AND VOMITING THAT IS NOT CONTROLLED WITH YOUR NAUSEA MEDICATION °*UNUSUAL SHORTNESS OF BREATH °*UNUSUAL BRUISING OR BLEEDING °*URINARY PROBLEMS (pain or burning when urinating, or frequent urination) °*BOWEL PROBLEMS (unusual diarrhea, constipation, pain near the anus) °TENDERNESS IN MOUTH AND THROAT WITH OR WITHOUT PRESENCE OF ULCERS (sore throat, sores in mouth, or a toothache) °UNUSUAL RASH, SWELLING OR PAIN  °UNUSUAL VAGINAL DISCHARGE OR ITCHING  ° °Items with * indicate a potential emergency and should be followed up as soon as possible or go to the Emergency Department if any problems should occur. ° °Please show the CHEMOTHERAPY ALERT CARD or IMMUNOTHERAPY ALERT CARD at  check-in to the Emergency Department and triage nurse. ° °Should you have questions after your visit or need to cancel or reschedule your appointment, please contact [facility]  Dept: [phone]  and follow the prompts.  Office hours are 8:00 a.m. to 4:30 p.m. Monday - Friday. Please note that voicemails left after 4:00 p.m. may not be returned until the following business day.  We are closed weekends and major holidays. You have access to a nurse at all times for urgent questions. Please call the main number to the clinic Dept: [phone] and follow the prompts. ° ° °For any non-urgent questions, you may also contact your provider using MyChart. We now offer e-Visits for anyone 18 and older to request care online for non-urgent symptoms. For details visit [email or web address]. °  °Also download the MyChart app! Go to the app store, search "MyChart", open the app, select [facility], and log in with your MyChart username and password. ° °Due to Covid, a mask is required upon entering the hospital/clinic. If you do not have a mask, one will be given to you upon arrival. For doctor visits, patients may have 1 support person aged 18 or older with them. For treatment visits, patients cannot have anyone with them due to current Covid guidelines and our immunocompromised population.  ° °

## 2020-10-26 ENCOUNTER — Encounter: Payer: Self-pay | Admitting: Oncology

## 2020-10-26 ENCOUNTER — Other Ambulatory Visit: Payer: Self-pay | Admitting: *Deleted

## 2020-10-26 DIAGNOSIS — C50411 Malignant neoplasm of upper-outer quadrant of right female breast: Secondary | ICD-10-CM

## 2020-10-26 DIAGNOSIS — E860 Dehydration: Secondary | ICD-10-CM

## 2020-10-26 DIAGNOSIS — Z170 Estrogen receptor positive status [ER+]: Secondary | ICD-10-CM

## 2020-10-26 MED ORDER — AMOXICILLIN-POT CLAVULANATE 875-125 MG PO TABS: 1.0000 | ORAL_TABLET | Freq: Two times a day (BID) | ORAL | 0 refills | Status: AC

## 2020-10-26 MED ORDER — SODIUM CHLORIDE 0.9 % IV SOLN
INTRAVENOUS | Status: DC
  Filled 2020-10-26: qty 250

## 2020-10-26 NOTE — Progress Notes (Signed)
n

## 2020-10-27 ENCOUNTER — Inpatient Hospital Stay: Payer: No Typology Code available for payment source

## 2020-10-27 ENCOUNTER — Ambulatory Visit: Payer: No Typology Code available for payment source

## 2020-10-27 ENCOUNTER — Other Ambulatory Visit: Payer: Self-pay

## 2020-10-27 VITALS — BP 91/51 | HR 67 | Temp 97.5°F | Resp 18

## 2020-10-27 DIAGNOSIS — Z5111 Encounter for antineoplastic chemotherapy: Secondary | ICD-10-CM | POA: Diagnosis not present

## 2020-10-27 DIAGNOSIS — Z95828 Presence of other vascular implants and grafts: Secondary | ICD-10-CM

## 2020-10-27 DIAGNOSIS — Z170 Estrogen receptor positive status [ER+]: Secondary | ICD-10-CM

## 2020-10-27 DIAGNOSIS — C50411 Malignant neoplasm of upper-outer quadrant of right female breast: Secondary | ICD-10-CM

## 2020-10-27 MED ORDER — SODIUM CHLORIDE 0.9% FLUSH
10.0000 mL | INTRAVENOUS | Status: AC
  Administered 2020-10-27: 10 mL via INTRAVENOUS
  Filled 2020-10-27: qty 10

## 2020-10-27 MED ORDER — SODIUM CHLORIDE 0.9 % IV SOLN
INTRAVENOUS | Status: DC
  Filled 2020-10-27: qty 250

## 2020-10-27 MED ORDER — PEGFILGRASTIM-BMEZ 6 MG/0.6ML ~~LOC~~ SOSY
6.0000 mg | PREFILLED_SYRINGE | Freq: Once | SUBCUTANEOUS | Status: AC
  Administered 2020-10-27: 6 mg via SUBCUTANEOUS

## 2020-10-27 MED ORDER — SODIUM CHLORIDE 0.9 % IV SOLN
INTRAVENOUS | Status: AC
  Filled 2020-10-27: qty 250

## 2020-10-27 MED ORDER — HEPARIN SOD (PORK) LOCK FLUSH 100 UNIT/ML IV SOLN
500.0000 [IU] | Freq: Once | INTRAVENOUS | Status: AC
  Administered 2020-10-27: 500 [IU] via INTRAVENOUS
  Filled 2020-10-27: qty 5

## 2020-10-27 NOTE — Patient Instructions (Addendum)
Rehydration, Adult Rehydration is the replacement of body fluids, salts, and minerals (electrolytes) that are lost during dehydration. Dehydration is when there is not enough water or other fluids in the body. This happens when you lose more fluids than you take in. Common causes of dehydration include:  Not drinking enough fluids. This can occur when you are ill or doing activities that require a lot of energy, especially in hot weather.  Conditions that cause loss of water or other fluids, such as diarrhea, vomiting, sweating, or urinating a lot.  Other illnesses, such as fever or infection.  Certain medicines, such as those that remove excess fluid from the body (diuretics). Symptoms of mild or moderate dehydration may include thirst, dry lips and mouth, and dizziness. Symptoms of severe dehydration may include increased heart rate, confusion, fainting, and not urinating. For severe dehydration, you may need to get fluids through an IV at the hospital. For mild or moderate dehydration, you can usually rehydrate at home by drinking certain fluids as told by your health care provider. What are the risks? Generally, rehydration is safe. However, taking in too much fluid (overhydration) can be a problem. This is rare. Overhydration can cause an electrolyte imbalance, kidney failure, or a decrease in salt (sodium) levels in the body. Supplies needed You will need an oral rehydration solution (ORS) if your health care provider tells you to use one. This is a drink to treat dehydration. It can be found in pharmacies and retail stores. How to rehydrate Fluids Follow instructions from your health care provider for rehydration. The kind of fluid and the amount you should drink depend on your condition. In general, you should choose drinks that you prefer.  If told by your health care provider, drink an ORS. ? Make an ORS by following instructions on the package. ? Start by drinking small amounts,  about  cup (120 mL) every 5-10 minutes. ? Slowly increase how much you drink until you have taken the amount recommended by your health care provider.  Drink enough clear fluids to keep your urine pale yellow. If you were told to drink an ORS, finish it first, then start slowly drinking other clear fluids. Drink fluids such as: ? Water. This includes sparkling water and flavored water. Drinking only water can lead to having too little sodium in your body (hyponatremia). Follow the advice of your health care provider. ? Water from ice chips you suck on. ? Fruit juice with water you add to it (diluted). ? Sports drinks. ? Hot or cold herbal teas. ? Broth-based soups. ? Milk or milk products. Food Follow instructions from your health care provider about what to eat while you rehydrate. Your health care provider may recommend that you slowly begin eating regular foods in small amounts.  Eat foods that contain a healthy balance of electrolytes, such as bananas, oranges, potatoes, tomatoes, and spinach.  Avoid foods that are greasy or contain a lot of sugar. In some cases, you may get nutrition through a feeding tube that is passed through your nose and into your stomach (nasogastric tube, or NG tube). This may be done if you have uncontrolled vomiting or diarrhea.   Beverages to avoid Certain beverages may make dehydration worse. While you rehydrate, avoid drinking alcohol.   How to tell if you are recovering from dehydration You may be recovering from dehydration if:  You are urinating more often than before you started rehydrating.  Your urine is pale yellow.  Your energy level   improves.  You vomit less frequently.  You have diarrhea less frequently.  Your appetite improves or returns to normal.  You feel less dizzy or less light-headed.  Your skin tone and color start to look more normal. Follow these instructions at home:  Take over-the-counter and prescription medicines only  as told by your health care provider.  Do not take sodium tablets. Doing this can lead to having too much sodium in your body (hypernatremia). Contact a health care provider if:  You continue to have symptoms of mild or moderate dehydration, such as: ? Thirst. ? Dry lips. ? Slightly dry mouth. ? Dizziness. ? Dark urine or less urine than normal. ? Muscle cramps.  You continue to vomit or have diarrhea. Get help right away if you:  Have symptoms of dehydration that get worse.  Have a fever.  Have a severe headache.  Have been vomiting and the following happens: ? Your vomiting gets worse or does not go away. ? Your vomit includes blood or green matter (bile). ? You cannot eat or drink without vomiting.  Have problems with urination or bowel movements, such as: ? Diarrhea that gets worse or does not go away. ? Blood in your stool (feces). This may cause stool to look black and tarry. ? Not urinating, or urinating only a small amount of very dark urine, within 6-8 hours.  Have trouble breathing.  Have symptoms that get worse with treatment. These symptoms may represent a serious problem that is an emergency. Do not wait to see if the symptoms will go away. Get medical help right away. Call your local emergency services (911 in the [place] Do not drive yourself to the hospital. Summary  Rehydration is the replacement of body fluids and minerals (electrolytes) that are lost during dehydration.  Follow instructions from your health care provider for rehydration. The kind of fluid and amount you should drink depend on your condition.  Slowly increase how much you drink until you have taken the amount recommended by your health care provider.  Contact your health care provider if you continue to show signs of mild or moderate dehydration. This information is not intended to replace advice given to you by your health care provider. Make sure you discuss any questions you have with  your health care provider. Document Revised: 08/10/2019 Document Reviewed: 06/20/2019 Else[employer] Patient Education  2021 [employer]  Pegfilgrastim injection What is this medicine? PEGFILGRASTIM (PEG fil gra stim) is a long-acting granulocyte colony-stimulating factor that stimulates the growth of neutrophils, a type of white blood cell important in the body's fight against infection. It is used to reduce the incidence of fever and infection in patients with certain types of cancer who are receiving chemotherapy that affects the bone marrow, and to increase survival after being exposed to high doses of radiation. This medicine may be used for other purposes; ask your health care provider or pharmacist if you have questions. COMMON BRAND NAME(S): Fulphila, Neulasta, Nyvepria, UDENYCA, Ziextenzo What should I tell my health care provider before I take this medicine? They need to know if you have any of these conditions:  kidney disease  latex allergy  ongoing radiation therapy  sickle cell disease  skin reactions to acrylic adhesives (On-Body Injector only)  an unusual or allergic reaction to pegfilgrastim, filgrastim, other medicines, foods, dyes, or preservatives  pregnant or trying to get pregnant  breast-feeding How should I use this medicine? This medicine is for injection under the skin. If you   get this medicine at home, you will be taught how to prepare and give the pre-filled syringe or how to use the On-body Injector. Refer to the patient Instructions for Use for detailed instructions. Use exactly as directed. Tell your healthcare provider immediately if you suspect that the On-body Injector may not have performed as intended or if you suspect the use of the On-body Injector resulted in a missed or partial dose. It is important that you put your used needles and syringes in a special sharps container. Do not put them in a trash can. If you do not have a sharps container, call  your pharmacist or healthcare provider to get one. Talk to your pediatrician regarding the use of this medicine in children. While this drug may be prescribed for selected conditions, precautions do apply. Overdosage: If you think you have taken too much of this medicine contact a poison control center or emergency room at once. NOTE: This medicine is only for you. Do not share this medicine with others. What if I miss a dose? It is important not to miss your dose. Call your doctor or health care professional if you miss your dose. If you miss a dose due to an On-body Injector failure or leakage, a new dose should be administered as soon as possible using a single prefilled syringe for manual use. What may interact with this medicine? Interactions have not been studied. This list may not describe all possible interactions. Give your health care provider a list of all the medicines, herbs, non-prescription drugs, or dietary supplements you use. Also tell them if you smoke, drink alcohol, or use illegal drugs. Some items may interact with your medicine. What should I watch for while using this medicine? Your condition will be monitored carefully while you are receiving this medicine. You may need blood work done while you are taking this medicine. Talk to your health care provider about your risk of cancer. You may be more at risk for certain types of cancer if you take this medicine. If you are going to need a MRI, CT scan, or other procedure, tell your doctor that you are using this medicine (On-Body Injector only). What side effects may I notice from receiving this medicine? Side effects that you should report to your doctor or health care professional as soon as possible:  allergic reactions (skin rash, itching or hives, swelling of the face, lips, or tongue)  back pain  dizziness  fever  pain, redness, or irritation at site where injected  pinpoint red spots on the skin  red or  dark-brown urine  shortness of breath or breathing problems  stomach or side pain, or pain at the shoulder  swelling  tiredness  trouble passing urine or change in the amount of urine  unusual bruising or bleeding Side effects that usually do not require medical attention (report to your doctor or health care professional if they continue or are bothersome):  bone pain  muscle pain This list may not describe all possible side effects. Call your doctor for medical advice about side effects. You may report side effects to FDA at 1-800-FDA-1088. Where should I keep my medicine? Keep out of the reach of children. If you are using this medicine at home, you will be instructed on how to store it. Throw away any unused medicine after the expiration date on the label. NOTE: This sheet is a summary. It may not cover all possible information. If you have questions about this medicine,   talk to your doctor, pharmacist, or health care provider.  2021 [employer] Standard (2019-07-01 13:20:51)  

## 2020-10-28 ENCOUNTER — Encounter: Payer: Self-pay | Admitting: Oncology

## 2020-10-29 ENCOUNTER — Encounter: Payer: Self-pay | Admitting: *Deleted

## 2020-10-29 ENCOUNTER — Other Ambulatory Visit: Payer: Self-pay

## 2020-10-29 ENCOUNTER — Inpatient Hospital Stay: Payer: No Typology Code available for payment source

## 2020-10-29 VITALS — BP 101/64 | HR 78 | Temp 97.8°F | Resp 18

## 2020-10-29 DIAGNOSIS — Z170 Estrogen receptor positive status [ER+]: Secondary | ICD-10-CM

## 2020-10-29 DIAGNOSIS — C50411 Malignant neoplasm of upper-outer quadrant of right female breast: Secondary | ICD-10-CM

## 2020-10-29 DIAGNOSIS — Z95828 Presence of other vascular implants and grafts: Secondary | ICD-10-CM

## 2020-10-29 DIAGNOSIS — Z5111 Encounter for antineoplastic chemotherapy: Secondary | ICD-10-CM | POA: Diagnosis not present

## 2020-10-29 MED ORDER — HEPARIN SOD (PORK) LOCK FLUSH 100 UNIT/ML IV SOLN
500.0000 [IU] | Freq: Once | INTRAVENOUS | Status: AC
Start: 2020-10-29 — End: 2020-10-29
  Administered 2020-10-29: 500 [IU]
  Filled 2020-10-29: qty 5

## 2020-10-29 MED ORDER — SODIUM CHLORIDE 0.9% FLUSH
10.0000 mL | Freq: Once | INTRAVENOUS | Status: AC
  Administered 2020-10-29: 10 mL
  Filled 2020-10-29: qty 10

## 2020-10-29 MED ORDER — SODIUM CHLORIDE 0.9 % IV SOLN
INTRAVENOUS | Status: DC
  Filled 2020-10-29 (×2): qty 250

## 2020-10-29 NOTE — Patient Instructions (Signed)
Rehydration, Adult Rehydration is the replacement of body fluids, salts, and minerals (electrolytes) that are lost during dehydration. Dehydration is when there is not enough water or other fluids in the body. This happens when you lose more fluids than you take in. Common causes of dehydration include:  Not drinking enough fluids. This can occur when you are ill or doing activities that require a lot of energy, especially in hot weather.  Conditions that cause loss of water or other fluids, such as diarrhea, vomiting, sweating, or urinating a lot.  Other illnesses, such as fever or infection.  Certain medicines, such as those that remove excess fluid from the body (diuretics). Symptoms of mild or moderate dehydration may include thirst, dry lips and mouth, and dizziness. Symptoms of severe dehydration may include increased heart rate, confusion, fainting, and not urinating. For severe dehydration, you may need to get fluids through an IV at the hospital. For mild or moderate dehydration, you can usually rehydrate at home by drinking certain fluids as told by your health care provider. What are the risks? Generally, rehydration is safe. However, taking in too much fluid (overhydration) can be a problem. This is rare. Overhydration can cause an electrolyte imbalance, kidney failure, or a decrease in salt (sodium) levels in the body. Supplies needed You will need an oral rehydration solution (ORS) if your health care provider tells you to use one. This is a drink to treat dehydration. It can be found in pharmacies and retail stores. How to rehydrate Fluids Follow instructions from your health care provider for rehydration. The kind of fluid and the amount you should drink depend on your condition. In general, you should choose drinks that you prefer.  If told by your health care provider, drink an ORS. ? Make an ORS by following instructions on the package. ? Start by drinking small amounts,  about  cup (120 mL) every 5-10 minutes. ? Slowly increase how much you drink until you have taken the amount recommended by your health care provider.  Drink enough clear fluids to keep your urine pale yellow. If you were told to drink an ORS, finish it first, then start slowly drinking other clear fluids. Drink fluids such as: ? Water. This includes sparkling water and flavored water. Drinking only water can lead to having too little sodium in your body (hyponatremia). Follow the advice of your health care provider. ? Water from ice chips you suck on. ? Fruit juice with water you add to it (diluted). ? Sports drinks. ? Hot or cold herbal teas. ? Broth-based soups. ? Milk or milk products. Food Follow instructions from your health care provider about what to eat while you rehydrate. Your health care provider may recommend that you slowly begin eating regular foods in small amounts.  Eat foods that contain a healthy balance of electrolytes, such as bananas, oranges, potatoes, tomatoes, and spinach.  Avoid foods that are greasy or contain a lot of sugar. In some cases, you may get nutrition through a feeding tube that is passed through your nose and into your stomach (nasogastric tube, or NG tube). This may be done if you have uncontrolled vomiting or diarrhea.   Beverages to avoid Certain beverages may make dehydration worse. While you rehydrate, avoid drinking alcohol.   How to tell if you are recovering from dehydration You may be recovering from dehydration if:  You are urinating more often than before you started rehydrating.  Your urine is pale yellow.  Your energy level   improves.  You vomit less frequently.  You have diarrhea less frequently.  Your appetite improves or returns to normal.  You feel less dizzy or less light-headed.  Your skin tone and color start to look more normal. Follow these instructions at home:  Take over-the-counter and prescription medicines only  as told by your health care provider.  Do not take sodium tablets. Doing this can lead to having too much sodium in your body (hypernatremia). Contact a health care provider if:  You continue to have symptoms of mild or moderate dehydration, such as: ? Thirst. ? Dry lips. ? Slightly dry mouth. ? Dizziness. ? Dark urine or less urine than normal. ? Muscle cramps.  You continue to vomit or have diarrhea. Get help right away if you:  Have symptoms of dehydration that get worse.  Have a fever.  Have a severe headache.  Have been vomiting and the following happens: ? Your vomiting gets worse or does not go away. ? Your vomit includes blood or green matter (bile). ? You cannot eat or drink without vomiting.  Have problems with urination or bowel movements, such as: ? Diarrhea that gets worse or does not go away. ? Blood in your stool (feces). This may cause stool to look black and tarry. ? Not urinating, or urinating only a small amount of very dark urine, within 6-8 hours.  Have trouble breathing.  Have symptoms that get worse with treatment. These symptoms may represent a serious problem that is an emergency. Do not wait to see if the symptoms will go away. Get medical help right away. Call your local emergency services (911 in the [place] Do not drive yourself to the hospital. Summary  Rehydration is the replacement of body fluids and minerals (electrolytes) that are lost during dehydration.  Follow instructions from your health care provider for rehydration. The kind of fluid and amount you should drink depend on your condition.  Slowly increase how much you drink until you have taken the amount recommended by your health care provider.  Contact your health care provider if you continue to show signs of mild or moderate dehydration. This information is not intended to replace advice given to you by your health care provider. Make sure you discuss any questions you have with  your health care provider. Document Revised: 08/10/2019 Document Reviewed: 06/20/2019 Elsevier Patient Education  2021 Elsevier Inc.  

## 2020-10-30 ENCOUNTER — Ambulatory Visit: Payer: No Typology Code available for payment source | Admitting: Oncology

## 2020-10-30 ENCOUNTER — Inpatient Hospital Stay: Payer: No Typology Code available for payment source

## 2020-10-30 ENCOUNTER — Ambulatory Visit: Payer: No Typology Code available for payment source

## 2020-10-30 ENCOUNTER — Other Ambulatory Visit: Payer: No Typology Code available for payment source

## 2020-10-30 VITALS — BP 105/65 | HR 74 | Temp 98.0°F | Resp 18

## 2020-10-30 DIAGNOSIS — Z95828 Presence of other vascular implants and grafts: Secondary | ICD-10-CM

## 2020-10-30 DIAGNOSIS — Z5111 Encounter for antineoplastic chemotherapy: Secondary | ICD-10-CM | POA: Diagnosis not present

## 2020-10-30 DIAGNOSIS — E860 Dehydration: Secondary | ICD-10-CM

## 2020-10-30 MED ORDER — SODIUM CHLORIDE 0.9% FLUSH
10.0000 mL | Freq: Once | INTRAVENOUS | Status: AC
  Administered 2020-10-30: 10 mL
  Filled 2020-10-30: qty 10

## 2020-10-30 MED ORDER — SODIUM CHLORIDE 0.9 % IV SOLN
INTRAVENOUS | Status: DC
  Filled 2020-10-30 (×2): qty 250

## 2020-10-30 MED ORDER — HEPARIN SOD (PORK) LOCK FLUSH 100 UNIT/ML IV SOLN
500.0000 [IU] | Freq: Once | INTRAVENOUS | Status: AC
Start: 2020-10-30 — End: 2020-10-30
  Administered 2020-10-30: 500 [IU]
  Filled 2020-10-30: qty 5

## 2020-10-30 NOTE — Patient Instructions (Signed)
Rehydration, Adult Rehydration is the replacement of body fluids, salts, and minerals (electrolytes) that are lost during dehydration. Dehydration is when there is not enough water or other fluids in the body. This happens when you lose more fluids than you take in. Common causes of dehydration include:  Not drinking enough fluids. This can occur when you are ill or doing activities that require a lot of energy, especially in hot weather.  Conditions that cause loss of water or other fluids, such as diarrhea, vomiting, sweating, or urinating a lot.  Other illnesses, such as fever or infection.  Certain medicines, such as those that remove excess fluid from the body (diuretics). Symptoms of mild or moderate dehydration may include thirst, dry lips and mouth, and dizziness. Symptoms of severe dehydration may include increased heart rate, confusion, fainting, and not urinating. For severe dehydration, you may need to get fluids through an IV at the hospital. For mild or moderate dehydration, you can usually rehydrate at home by drinking certain fluids as told by your health care provider. What are the risks? Generally, rehydration is safe. However, taking in too much fluid (overhydration) can be a problem. This is rare. Overhydration can cause an electrolyte imbalance, kidney failure, or a decrease in salt (sodium) levels in the body. Supplies needed You will need an oral rehydration solution (ORS) if your health care provider tells you to use one. This is a drink to treat dehydration. It can be found in pharmacies and retail stores. How to rehydrate Fluids Follow instructions from your health care provider for rehydration. The kind of fluid and the amount you should drink depend on your condition. In general, you should choose drinks that you prefer.  If told by your health care provider, drink an ORS. ? Make an ORS by following instructions on the package. ? Start by drinking small amounts,  about  cup (120 mL) every 5-10 minutes. ? Slowly increase how much you drink until you have taken the amount recommended by your health care provider.  Drink enough clear fluids to keep your urine pale yellow. If you were told to drink an ORS, finish it first, then start slowly drinking other clear fluids. Drink fluids such as: ? Water. This includes sparkling water and flavored water. Drinking only water can lead to having too little sodium in your body (hyponatremia). Follow the advice of your health care provider. ? Water from ice chips you suck on. ? Fruit juice with water you add to it (diluted). ? Sports drinks. ? Hot or cold herbal teas. ? Broth-based soups. ? Milk or milk products. Food Follow instructions from your health care provider about what to eat while you rehydrate. Your health care provider may recommend that you slowly begin eating regular foods in small amounts.  Eat foods that contain a healthy balance of electrolytes, such as bananas, oranges, potatoes, tomatoes, and spinach.  Avoid foods that are greasy or contain a lot of sugar. In some cases, you may get nutrition through a feeding tube that is passed through your nose and into your stomach (nasogastric tube, or NG tube). This may be done if you have uncontrolled vomiting or diarrhea.   Beverages to avoid Certain beverages may make dehydration worse. While you rehydrate, avoid drinking alcohol.   How to tell if you are recovering from dehydration You may be recovering from dehydration if:  You are urinating more often than before you started rehydrating.  Your urine is pale yellow.  Your energy level   improves.  You vomit less frequently.  You have diarrhea less frequently.  Your appetite improves or returns to normal.  You feel less dizzy or less light-headed.  Your skin tone and color start to look more normal. Follow these instructions at home:  Take over-the-counter and prescription medicines only  as told by your health care provider.  Do not take sodium tablets. Doing this can lead to having too much sodium in your body (hypernatremia). Contact a health care provider if:  You continue to have symptoms of mild or moderate dehydration, such as: ? Thirst. ? Dry lips. ? Slightly dry mouth. ? Dizziness. ? Dark urine or less urine than normal. ? Muscle cramps.  You continue to vomit or have diarrhea. Get help right away if you:  Have symptoms of dehydration that get worse.  Have a fever.  Have a severe headache.  Have been vomiting and the following happens: ? Your vomiting gets worse or does not go away. ? Your vomit includes blood or green matter (bile). ? You cannot eat or drink without vomiting.  Have problems with urination or bowel movements, such as: ? Diarrhea that gets worse or does not go away. ? Blood in your stool (feces). This may cause stool to look black and tarry. ? Not urinating, or urinating only a small amount of very dark urine, within 6-8 hours.  Have trouble breathing.  Have symptoms that get worse with treatment. These symptoms may represent a serious problem that is an emergency. Do not wait to see if the symptoms will go away. Get medical help right away. Call your local emergency services (911 in the [place] Do not drive yourself to the hospital. Summary  Rehydration is the replacement of body fluids and minerals (electrolytes) that are lost during dehydration.  Follow instructions from your health care provider for rehydration. The kind of fluid and amount you should drink depend on your condition.  Slowly increase how much you drink until you have taken the amount recommended by your health care provider.  Contact your health care provider if you continue to show signs of mild or moderate dehydration. This information is not intended to replace advice given to you by your health care provider. Make sure you discuss any questions you have with  your health care provider. Document Revised: 08/10/2019 Document Reviewed: 06/20/2019 Elsevier Patient Education  2021 Elsevier Inc.  

## 2020-11-01 ENCOUNTER — Ambulatory Visit: Payer: No Typology Code available for payment source

## 2020-11-08 ENCOUNTER — Ambulatory Visit: Payer: No Typology Code available for payment source

## 2020-11-08 ENCOUNTER — Ambulatory Visit: Payer: No Typology Code available for payment source | Admitting: Oncology

## 2020-11-08 ENCOUNTER — Other Ambulatory Visit: Payer: No Typology Code available for payment source

## 2020-11-10 ENCOUNTER — Ambulatory Visit: Payer: No Typology Code available for payment source

## 2020-11-12 ENCOUNTER — Encounter: Payer: Self-pay | Admitting: Oncology

## 2020-11-14 ENCOUNTER — Telehealth: Payer: Self-pay | Admitting: Oncology

## 2020-11-14 ENCOUNTER — Other Ambulatory Visit: Payer: Self-pay | Admitting: Medical Oncology

## 2020-11-14 NOTE — Progress Notes (Signed)
[facility]  [phone]) [phone] [phone]     ID: Melissa Mcgrath DOB: 1961/04/23  MR#: [id number]  DEY#:[id number]  Patient Care Team: Doree Albee, MD as PCP - General (Internal Medicine) Mauro Kaufmann, RN as Oncology Nurse Navigator Rockwell Germany, RN as Oncology Nurse Navigator Danel Requena, Virgie Dad, MD as Consulting Physician (Oncology) Stark Klein, MD as Consulting Physician (General Surgery) Gery Pray, MD as Consulting Physician (Radiation Oncology) Dian Queen, MD as Consulting Physician (Obstetrics and Gynecology) Ulla Gallo, MD as Consulting Physician (Dermatology) Chauncey Cruel, MD OTHER MD:   CHIEF COMPLAINT: Estrogen receptor positive breast cancer  CURRENT TREATMENT: Neoadjuvant chemotherapy   INTERVAL HISTORY: Melissa Mcgrath returns today for follow up and treatment of her estrogen receptor positive breast cancer.  She is accompanied by her mother  She began neoadjuvant chemotherapy, consisting of cyclophosphamide and doxorubicin in dose dense fashion x4, on 09/18/2020. Today is her fourth and final cycle, to be followed by weekly paclitaxel x12.    REVIEW OF SYSTEMS: Melissa Mcgrath found cycle 3 "rougher" than the earlier ones.  She was "sick" for about 8 days.  By that she means she had a stomach upset although she did not vomit or have diarrhea.  She just could not eat or drink she says.  She is helped by the fluids she gets after treatment but I think she actually will need more than she has been getting.  She also had some constipation.  After about 10 days as she started feeling a little better and then she gained most of the weight that she lost in the interim.  Quite aside from all that she is having some knee pain.  She has had some visual changes.  A detailed review of systems was otherwise stable   COVID 19 VACCINATION STATUS: [place] x2, most recently 01/2020, no booster as of 08/29/2020   HISTORY OF CURRENT  ILLNESS: From the original intake note:  Melissa Mcgrath herself palpated an upper-outer right breast lump and noted associated intermittent pain and itching. She underwent bilateral diagnostic mammography with tomography and right breast ultrasonography at [facility] on Mcgrath/28/2022 showing: breast density category B; palpable Mcgrath.1 cm irregular mass in right breast at 9 o'clock; three abnormal-appearing right axillary tail nodes; indeterminate 4 mm oval mass located 1.8 cm lateral to dominant mass.  Accordingly on 08/21/2020 she proceeded to biopsy of the right breast area in question. The pathology from this procedure ([id number]) showed: invasive mammary carcinoma, e-cadherin positive, grade 3. Prognostic indicators significant for: estrogen receptor, 90% positive with strong staining intensity and progesterone receptor, Mcgrath% positive with weak staining intensity. Proliferation marker Ki67 at 40%. HER2 equivocal by immunohistochemistry (Mcgrath+), but negative by fluorescent in situ hybridization (signals ratio and number per cell not available today).  The biopsied lymph node in the right axillary tail also showed invasive ductal carcinoma. No lymph node tissue was identified, possibly representing an entirely replaced lymph node.  Cancer Staging Malignant neoplasm of upper-outer quadrant of right breast in female, estrogen receptor positive ([place]) Staging form: Breast, AJCC 8th Edition - Clinical stage from 08/29/2020: Stage IIIA (cT2, cN2, cM0, G3, ER+, PR+, HER2-) - Signed by Gardenia Phlegm, NP on 09/07/2020 Stage prefix: Initial diagnosis Stage used in treatment planning: Yes National guidelines used in treatment planning: Yes Type of national guideline used in treatment planning: NCCN  The patient's subsequent history is as detailed below.   PAST MEDICAL HISTORY: Past Medical History:  Diagnosis Date  .  BV (bacterial vaginosis) 10/27/2014  . Depression 01/05/2014  . Facial basal cell cancer   .  Family history of breast cancer   . Family history of melanoma   . Family history of ovarian cancer   . Family history of pancreatic cancer   . Menopause 01/05/2014  . Vaginal atrophy 11/09/2014  . Vaginal itching 10/27/2014    PAST SURGICAL HISTORY: Past Surgical History:  Procedure Laterality Date  . BREAST ENHANCEMENT SURGERY    . CARPAL TUNNEL RELEASE Right   . PORTACATH PLACEMENT N/A 09/17/2020   Procedure: INSERTION PORT-A-CATH;  Surgeon: Stark Klein, MD;  Location: [facility];  Service: General;  Laterality: N/A;  . refractive lensectomy Bilateral     FAMILY HISTORY: Family History  Problem Relation Age of Onset  . Breast cancer Mother 37       breast  . Hypertension Father   . Heart attack Father   . Alzheimer's disease Father   . Fibromyalgia Sister   . Diabetes Sister   . Heart attack Sister   . Hypertension Sister   . Hypertension Brother   . Diabetes Brother   . Pancreatic cancer Maternal Grandmother 24       Pancreatic  . Melanoma Paternal Uncle        dx 56s  . Melanoma Paternal Uncle        dx 29s  . Ovarian cancer Paternal Aunt        dx 50s/60s  Her father died at age Mcgrath from Alzheimer's. Her mother died at age 38 from metastatic breast cancer. She was initially diagnosed with breast cancer at age 66. Melissa Mcgrath has two brothers and one sister. In addition to her mother, she reports cancer in a paternal aunt (ovarian in mid 25's) and in her maternal grandmother (pancreatic at age 59).    GYNECOLOGIC HISTORY:  No LMP recorded. Patient is postmenopausal. Menarche: 60 years old Age at first live birth: 60 years old Melissa Mcgrath LMP 2010 Contraceptive: used for approx. 20 years HRT used from 11/2019 until diagnosis  Hysterectomy? no BSO? 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 Husband Melissa [place] "Fritz Pickerel" is retired from working for [employer]. For exercise, she walks daily, rides horses every other week, and does farm work  daily. Daughter Melissa Mcgrath, age 19, is a horse trainer/instructor. Daughter Melissa Mcgrath, age 42, is a Copywriter, advertising in [place]. Melissa Mcgrath has one grandchild. She attends [employer].    ADVANCED DIRECTIVES: In the absence of any documentation to the contrary, the patient's spouse is their HCPOA.    HEALTH MAINTENANCE: Social History   Tobacco Use  . Smoking status: Never Smoker  . Smokeless tobacco: Never Used  Vaping Use  . Vaping Use: Never used  Substance Use Topics  . Alcohol use: No  . Drug use: No     Colonoscopy: never done  PAP: 01/2020  Bone density: never done   No Known Allergies  Current Outpatient Medications  Medication Sig Dispense Refill  . dexamethasone (DECADRON) 4 MG tablet Take Mcgrath tablets (8 mg total) by mouth daily. Take daily for 3 days after chemo. Take with food. 30 tablet 1  . Docusate Sodium (STOOL SOFTENER) 100 MG capsule Take Mcgrath tablets (200 mg total) by mouth Mcgrath (two) times daily as needed for constipation. 10 tablet 0  . lidocaine-prilocaine (EMLA) cream Apply 1 application topically as needed. 30 g 1  . loratadine (CLARITIN) 10 MG tablet  Take 1 tablet (10 mg total) by mouth daily. 90 tablet 0  . LORazepam (ATIVAN) 0.5 MG tablet Take 1 tablet (0.5 mg total) by mouth at bedtime as needed (Nausea or vomiting). 20 tablet 0  . magic mouthwash SOLN 5 ml swish and spit QID prn, oral tenderness 240 mL Mcgrath  . Multiple Vitamin (MULTIVITAMIN) tablet Take 1 tablet by mouth daily.    . ondansetron (ZOFRAN) 8 MG tablet Take 1 tablet (8 mg total) by mouth every 8 (eight) hours as needed for nausea or vomiting. Start day 3 after chemotherapy. 20 tablet 0  . oxyCODONE (OXY IR/ROXICODONE) 5 MG immediate release tablet Take 0.5-1 tablets (Mcgrath.5-5 mg total) by mouth every 6 (six) hours as needed for severe pain. 5 tablet 0  . polyethylene glycol powder (MIRALAX) 17 GM/SCOOP powder Take 17 g by mouth daily as needed. 255 g 0  . prochlorperazine (COMPAZINE) 10 MG tablet  Take 0.5 tablets (5 mg total) by mouth every 6 (six) hours as needed (Nausea or vomiting). 30 tablet 1  . triamcinolone lotion (KENALOG) 0.1 % Apply 1 application topically 3 (three) times daily. 120 mL 3  . valACYclovir (VALTREX) 1000 MG tablet Take 1 tablet (1,000 mg total) by mouth daily. 30 tablet 0   No current facility-administered medications for this visit.   Facility-Administered Medications Ordered in Other Visits  Medication Dose Route Frequency Provider Last Rate Last Admin  . cyclophosphamide (CYTOXAN) 1,040 mg in sodium chloride 0.9 % 250 mL chemo infusion  600 mg/m2 (Treatment Plan Recorded) Intravenous Once Manuel Dall, Virgie Dad, MD      . DOXOrubicin (ADRIAMYCIN) chemo injection 104 mg  60 mg/m2 (Treatment Plan Recorded) Intravenous Once Jaydah Stahle, Virgie Dad, MD      . fosaprepitant (EMEND) 150 mg in sodium chloride 0.9 % 145 mL IVPB  150 mg Intravenous Once Leianna Barga, Virgie Dad, MD 450 mL/hr at 11/15/20 1352 150 mg at 11/15/20 1352    OBJECTIVE: White woman in no acute distress  Vitals:   11/15/20 1209  BP: 116/77  Pulse: 83  Resp: 18  Temp: 97.9 F (36.6 C)  SpO2: 100%     Body mass index is 20.57 kg/m.   Wt Readings from Last 3 Encounters:  11/15/20 135 lb 4.8 oz (61.4 kg)  10/25/20 134 lb 8 oz (61 kg)  10/04/20 136 lb 12.8 oz (62.1 kg)      ECOG FS:1 - Symptomatic but completely ambulatory  Sclerae unicteric, EOMs intact Wearing a mask No cervical or supraclavicular adenopathy Lungs no rales or rhonchi Heart regular rate and rhythm Abd soft, nontender, positive bowel sounds MSK no focal spinal tenderness, no upper extremity lymphedema Neuro: nonfocal, well oriented, appropriate affect Breasts: The mass in the right breast at the 930 o'clock position is much harder to palpate today.  Both axillae are benign.  The left breast is benign.   LAB RESULTS:  CMP     Component Value Date/Time   NA 141 11/15/2020 1204   K 4.0 11/15/2020 1204   CL 106 11/15/2020  1204   CO2 27 11/15/2020 1204   GLUCOSE 94 11/15/2020 1204   BUN 14 11/15/2020 1204   CREATININE 0.71 11/15/2020 1204   CREATININE 0.87 08/29/2020 0820   CREATININE 0.76 12/20/2013 1207   CALCIUM 9.3 11/15/2020 1204   PROT 6.7 11/15/2020 1204   ALBUMIN 3.8 11/15/2020 1204   AST 23 11/15/2020 1204   AST 12 (L) 08/29/2020 0820   ALT 23 11/15/2020 1204   ALT  12 08/29/2020 0820   ALKPHOS 66 11/15/2020 1204   BILITOT 0.3 11/15/2020 1204   BILITOT 0.6 08/29/2020 0820   GFRNONAA >60 11/15/2020 1204   GFRNONAA >60 08/29/2020 0820    No results found for: TOTALPROTELP, ALBUMINELP, A1GS, A2GS, BETS, BETA2SER, GAMS, MSPIKE, SPEI  Lab Results  Component Value Date   WBC 4.1 11/15/2020   NEUTROABS Mcgrath.3 11/15/2020   HGB 10.3 (L) 11/15/2020   HCT 31.4 (L) 11/15/2020   MCV 98.4 11/15/2020   PLT 340 11/15/2020    No results found for: LABCA2  No components found for: LJQGBE010  No results for input(s): INR in the last 168 hours.  No results found for: LABCA2  No results found for: OFH219  No results found for: XJO832  No results found for: PQD826  No results found for: CA2729  No components found for: HGQUANT  No results found for: CEA1 / No results found for: CEA1   No results found for: AFPTUMOR  No results found for: CHROMOGRNA  No results found for: KPAFRELGTCHN, LAMBDASER, KAPLAMBRATIO (kappa/lambda light chains)  No results found for: HGBA, HGBA2QUANT, HGBFQUANT, HGBSQUAN (Hemoglobinopathy evaluation)   No results found for: LDH  No results found for: IRON, TIBC, IRONPCTSAT (Iron and TIBC)  No results found for: FERRITIN  Urinalysis No results found for: COLORURINE, APPEARANCEUR, LABSPEC, PHURINE, GLUCOSEU, HGBUR, BILIRUBINUR, KETONESUR, PROTEINUR, UROBILINOGEN, NITRITE, LEUKOCYTESUR   STUDIES: No results found.   ELIGIBLE FOR AVAILABLE RESEARCH PROTOCOL: no  ASSESSMENT: 60 y.o. [place] woman status post right breast upper outer quadrant biopsy  08/21/2020 for a clinical mT2 N1, stage IIA/B invasive ductal carcinoma, grade 3, estrogen receptor positive, progesterone receptor weakly positive, HER-Mcgrath not amplified, with an MIB-1 of 40%  (a) breast MRI 09/05/2020 shows a clinical T2 N2, stage IIIA disease  (b) bone scan and chest CT scan 09/14/2020 show no evidence of metastatic disease  (1) genetics testing 09/18/2020 through the   CancerNext-Expanded + RNAinsight gene panel offered by [employer] found no deleterious mutations in AIP, ALK, APC, ATM, AXIN2, BAP1, BARD1, BLM, BMPR1A, BRCA1, BRCA2, BRIP1, CDC73, CDH1, CDK4, CDKN1B, CDKN2A, CHEK2, CTNNA1, DICER1, FANCC, FH, FLCN, GALNT12, KIF1B, LZTR1, MAX, MEN1, MET, MLH1, MSH2, MSH3, MSH6, MUTYH, NBN, NF1, NF2, NTHL1, PALB2, PHOX2B, PMS2, POT1, PRKAR1A, PTCH1, PTEN, RAD51C, RAD51D, RB1, RECQL, RET, SDHA, SDHAF2, SDHB, SDHC, SDHD, SMAD4, SMARCA4, SMARCB1, SMARCE1, STK11, SUFU, TMEM127, TP53, TSC1, TSC2, VHL and XRCC2 (sequencing and deletion/duplication); EGFR, EGLN1, HOXB13, KIT, MITF, PDGFRA, POLD1 and POLE (sequencing only); EPCAM and GREM1 (deletion/duplication only). RNA data is routinely analyzed for use in variant interpretation for all genes.   (Mcgrath) neoadjuvant chemotherapy consisting of cyclophosphamide and doxorubicin in dose dense fashion x4 starting 09/18/2020, to be followed by weekly paclitaxel x12  (a) echo 09/11/2020 shows an ejection fraction in the 60-65% range  (b) cycles 3 and 4 were each delayed by 1 week given multiple side effects.  (3) definitive surgery to follow: Patient is considering bilateral mastectomies without reconstruction  (4) adjuvant radiation  (5) adjuvant antiestrogens   PLAN: Deslyn will proceed to her final cycle of chemotherapy today.  It has been a real struggle for her to get through this first portion but I am hopeful the second portion will be considerably easier and we did discuss that at some detail today.  I think she could use additional  fluid support and I am entering some orders for her to receive fluids not only the next Mcgrath days but also on May 31 and  June 1.  She has a show coming up in her property which means she has to [facility] and she is the only one who can do that she says.  Hopefully she will be able to get that done in the next Mcgrath weeks.  Once we start the Taxol she is already thinking ahead to a July show where her daughter will be featured and I assured her we could give her a week off Taxol and make it up at the end  She will return to see me in 3 weeks.  That day she will start her Taxol treatments as well  Total encounter time 25 minutes.Sarajane Jews C. Jazlene Bares, MD 11/15/2020 Mcgrath:12 PM Medical Oncology and Hematology [facility] [place], [place] [address] Tel. [phone]    Fax. [phone]   This document serves as a record of services personally performed by Lurline Del, MD. It was created on his behalf by [name], a trained medical scribe. The creation of this record is based on the scribe's personal observations and the provider's statements to them.   I, Lurline Del MD, have reviewed the above documentation for accuracy and completeness, and I agree with the above.   *Total Encounter Time as defined by the Centers for Medicare and Medicaid Services includes, in addition to the face-to-face time of a patient visit (documented in the note above) non-face-to-face time: obtaining and reviewing outside history, ordering and reviewing medications, tests or procedures, care coordination (communications with other health care professionals or caregivers) and documentation in the medical record.

## 2020-11-14 NOTE — Telephone Encounter (Signed)
Scheduled appts per 5/24 sch msg. Called pt, no answer. Left msg with appts dates and times.  

## 2020-11-15 ENCOUNTER — Other Ambulatory Visit: Payer: Self-pay

## 2020-11-15 ENCOUNTER — Inpatient Hospital Stay: Payer: No Typology Code available for payment source

## 2020-11-15 ENCOUNTER — Inpatient Hospital Stay (HOSPITAL_BASED_OUTPATIENT_CLINIC_OR_DEPARTMENT_OTHER): Payer: No Typology Code available for payment source | Admitting: Oncology

## 2020-11-15 VITALS — BP 116/77 | HR 83 | Temp 97.9°F | Resp 18 | Ht 68.0 in | Wt 135.3 lb

## 2020-11-15 DIAGNOSIS — Z170 Estrogen receptor positive status [ER+]: Secondary | ICD-10-CM

## 2020-11-15 DIAGNOSIS — C50411 Malignant neoplasm of upper-outer quadrant of right female breast: Secondary | ICD-10-CM

## 2020-11-15 DIAGNOSIS — Z95828 Presence of other vascular implants and grafts: Secondary | ICD-10-CM

## 2020-11-15 DIAGNOSIS — Z5111 Encounter for antineoplastic chemotherapy: Secondary | ICD-10-CM | POA: Diagnosis not present

## 2020-11-15 LAB — CBC WITH DIFFERENTIAL/PLATELET
Abs Immature Granulocytes: 0.05 10*3/uL (ref 0.00–0.07)
Basophils Absolute: 0.1 10*3/uL (ref 0.0–0.1)
Basophils Relative: 2 %
Eosinophils Absolute: 0 10*3/uL (ref 0.0–0.5)
Eosinophils Relative: 0 %
HCT: 31.4 % — ABNORMAL LOW (ref 36.0–46.0)
Hemoglobin: 10.3 g/dL — ABNORMAL LOW (ref 12.0–15.0)
Immature Granulocytes: 1 %
Lymphocytes Relative: 18 %
Lymphs Abs: 0.7 10*3/uL (ref 0.7–4.0)
MCH: 32.3 pg (ref 26.0–34.0)
MCHC: 32.8 g/dL (ref 30.0–36.0)
MCV: 98.4 fL (ref 80.0–100.0)
Monocytes Absolute: 0.9 10*3/uL (ref 0.1–1.0)
Monocytes Relative: 23 %
Neutro Abs: 2.3 10*3/uL (ref 1.7–7.7)
Neutrophils Relative %: 56 %
Platelets: 340 10*3/uL (ref 150–400)
RBC: 3.19 MIL/uL — ABNORMAL LOW (ref 3.87–5.11)
RDW: 16.6 % — ABNORMAL HIGH (ref 11.5–15.5)
WBC: 4.1 10*3/uL (ref 4.0–10.5)
nRBC: 0 % (ref 0.0–0.2)

## 2020-11-15 LAB — COMPREHENSIVE METABOLIC PANEL
ALT: 23 U/L (ref 0–44)
AST: 23 U/L (ref 15–41)
Albumin: 3.8 g/dL (ref 3.5–5.0)
Alkaline Phosphatase: 66 U/L (ref 38–126)
Anion gap: 8 (ref 5–15)
BUN: 14 mg/dL (ref 6–20)
CO2: 27 mmol/L (ref 22–32)
Calcium: 9.3 mg/dL (ref 8.9–10.3)
Chloride: 106 mmol/L (ref 98–111)
Creatinine, Ser: 0.71 mg/dL (ref 0.44–1.00)
GFR, Estimated: >60 mL/min (ref >60)
Glucose, Bld: 94 mg/dL (ref 70–99)
Potassium: 4 mmol/L (ref 3.5–5.1)
Sodium: 141 mmol/L (ref 135–145)
Total Bilirubin: 0.3 mg/dL (ref 0.3–1.2)
Total Protein: 6.7 g/dL (ref 6.5–8.1)

## 2020-11-15 MED ORDER — PALONOSETRON HCL INJECTION 0.25 MG/5ML
0.2500 mg | Freq: Once | INTRAVENOUS | Status: AC
  Administered 2020-11-15: 0.25 mg via INTRAVENOUS

## 2020-11-15 MED ORDER — SODIUM CHLORIDE 0.9 % IV SOLN
10.0000 mg | Freq: Once | INTRAVENOUS | Status: AC
  Administered 2020-11-15: 10 mg via INTRAVENOUS
  Filled 2020-11-15: qty 10

## 2020-11-15 MED ORDER — DOXORUBICIN HCL CHEMO IV INJECTION 2 MG/ML
60.0000 mg/m2 | Freq: Once | INTRAVENOUS | Status: AC
  Administered 2020-11-15: 104 mg via INTRAVENOUS
  Filled 2020-11-15: qty 52

## 2020-11-15 MED ORDER — SODIUM CHLORIDE 0.9 % IV SOLN
600.0000 mg/m2 | Freq: Once | INTRAVENOUS | Status: AC
  Administered 2020-11-15: 1040 mg via INTRAVENOUS
  Filled 2020-11-15: qty 52

## 2020-11-15 MED ORDER — SODIUM CHLORIDE 0.9 % IV SOLN
150.0000 mg | Freq: Once | INTRAVENOUS | Status: AC
  Administered 2020-11-15: 150 mg via INTRAVENOUS
  Filled 2020-11-15: qty 150

## 2020-11-15 MED ORDER — PALONOSETRON HCL INJECTION 0.25 MG/5ML
INTRAVENOUS | Status: AC
  Filled 2020-11-15: qty 5

## 2020-11-15 MED ORDER — SODIUM CHLORIDE 0.9% FLUSH
10.0000 mL | Freq: Once | INTRAVENOUS | Status: AC
  Administered 2020-11-15: 10 mL
  Filled 2020-11-15: qty 10

## 2020-11-15 MED ORDER — SODIUM CHLORIDE 0.9 % IV SOLN
Freq: Once | INTRAVENOUS | Status: AC
Start: 2020-11-15 — End: 2020-11-15
  Filled 2020-11-15: qty 250

## 2020-11-15 NOTE — Patient Instructions (Signed)
[facility] Discharge Instructions for Patients Receiving Chemotherapy  Today you received the following chemotherapy agents: Doxorubicin (Adriamycin) and Cyclophosphamide (Cytoxan)  To help prevent nausea and vomiting after your treatment, we encourage you to take your nausea medication as directed by your MD.   If you develop nausea and vomiting that is not controlled by your nausea medication, call the clinic.   BELOW ARE SYMPTOMS THAT SHOULD BE REPORTED IMMEDIATELY:  *FEVER GREATER THAN 100.5 F  *CHILLS WITH OR WITHOUT FEVER  NAUSEA AND VOMITING THAT IS NOT CONTROLLED WITH YOUR NAUSEA MEDICATION  *UNUSUAL SHORTNESS OF BREATH  *UNUSUAL BRUISING OR BLEEDING  TENDERNESS IN MOUTH AND THROAT WITH OR WITHOUT PRESENCE OF ULCERS  *URINARY PROBLEMS  *BOWEL PROBLEMS  UNUSUAL RASH Items with * indicate a potential emergency and should be followed up as soon as possible.  Feel free to call the clinic should you have any questions or concerns. The clinic phone number is [phone].  Please show the [facility] at check-in to the Emergency Department and triage nurse.   Hypokalemia Hypokalemia means that the amount of potassium in the blood is lower than normal. Potassium is a chemical (electrolyte) that helps regulate the amount of fluid in the body. It also stimulates muscle tightening (contraction) and helps nerves work properly. Normally, most of the body's potassium is inside cells, and only a very small amount is in the blood. Because the amount in the blood is so small, minor changes to potassium levels in the blood can be life-threatening. What are the causes? This condition may be caused by:  Antibiotic medicine.  Diarrhea or vomiting. Taking too much of a medicine that helps you have a bowel movement (laxative) can cause diarrhea and lead to hypokalemia.  Chronic kidney disease (CKD).  Medicines that help the body get rid of excess fluid  (diuretics).  Eating disorders, such as bulimia.  Low magnesium levels in the body.  Sweating a lot. What are the signs or symptoms? Symptoms of this condition include:  Weakness.  Constipation.  Fatigue.  Muscle cramps.  Mental confusion.  Skipped heartbeats or irregular heartbeat (palpitations).  Tingling or numbness. How is this diagnosed? This condition is diagnosed with a blood test. How is this treated? This condition may be treated by:  Taking potassium supplements by mouth.  Adjusting the medicines that you take.  Eating more foods that contain a lot of potassium. If your potassium level is very low, you may need to get potassium through an IV and be monitored in the hospital. Follow these instructions at home:  Take over-the-counter and prescription medicines only as told by your health care provider. This includes vitamins and supplements.  Eat a healthy diet. A healthy diet includes fresh fruits and vegetables, whole grains, healthy fats, and lean proteins.  If instructed, eat more foods that contain a lot of potassium. This includes: ? Nuts, such as peanuts and pistachios. ? Seeds, such as sunflower seeds and pumpkin seeds. ? Peas, lentils, and lima beans. ? Whole grain and bran cereals and breads. ? Fresh fruits and vegetables, such as apricots, avocado, bananas, cantaloupe, kiwi, oranges, tomatoes, asparagus, and potatoes. ? Orange juice. ? Tomato juice. ? Red meats. ? Yogurt.  Keep all follow-up visits as told by your health care provider. This is important.   Contact a health care provider if you:  Have weakness that gets worse.  Feel your heart pounding or racing.  Vomit.  Have diarrhea.  Have diabetes (  diabetes mellitus) and you have trouble keeping your blood sugar (glucose) in your target range. Get help right away if you:  Have chest pain.  Have shortness of breath.  Have vomiting or diarrhea that lasts for more than 2  days.  Faint. Summary  Hypokalemia means that the amount of potassium in the blood is lower than normal.  This condition is diagnosed with a blood test.  Hypokalemia may be treated by taking potassium supplements, adjusting the medicines that you take, or eating more foods that are high in potassium.  If your potassium level is very low, you may need to get potassium through an IV and be monitored in the hospital. This information is not intended to replace advice given to you by your health care provider. Make sure you discuss any questions you have with your health care provider. Document Revised: 01/20/2018 Document Reviewed: 01/20/2018 Elsevier Patient Education  [employer].

## 2020-11-16 ENCOUNTER — Inpatient Hospital Stay: Payer: No Typology Code available for payment source

## 2020-11-16 ENCOUNTER — Other Ambulatory Visit: Payer: Self-pay | Admitting: Oncology

## 2020-11-16 ENCOUNTER — Telehealth: Payer: Self-pay | Admitting: Oncology

## 2020-11-16 VITALS — BP 116/50 | HR 72 | Temp 97.7°F | Resp 18

## 2020-11-16 DIAGNOSIS — Z95828 Presence of other vascular implants and grafts: Secondary | ICD-10-CM

## 2020-11-16 DIAGNOSIS — Z5111 Encounter for antineoplastic chemotherapy: Secondary | ICD-10-CM | POA: Diagnosis not present

## 2020-11-16 MED ORDER — SODIUM CHLORIDE 0.9 % IV SOLN
INTRAVENOUS | Status: DC
  Filled 2020-11-16 (×2): qty 250

## 2020-11-16 MED ORDER — HEPARIN SOD (PORK) LOCK FLUSH 100 UNIT/ML IV SOLN
500.0000 [IU] | Freq: Once | INTRAVENOUS | Status: AC
  Administered 2020-11-16: 500 [IU]
  Filled 2020-11-16: qty 5

## 2020-11-16 MED ORDER — SODIUM CHLORIDE 0.9% FLUSH
10.0000 mL | Freq: Once | INTRAVENOUS | Status: AC
Start: 2020-11-16 — End: 2020-11-16
  Administered 2020-11-16: 10 mL
  Filled 2020-11-16: qty 10

## 2020-11-16 NOTE — Patient Instructions (Signed)
Rehydration, Adult Rehydration is the replacement of body fluids, salts, and minerals (electrolytes) that are lost during dehydration. Dehydration is when there is not enough water or other fluids in the body. This happens when you lose more fluids than you take in. Common causes of dehydration include:  Not drinking enough fluids. This can occur when you are ill or doing activities that require a lot of energy, especially in hot weather.  Conditions that cause loss of water or other fluids, such as diarrhea, vomiting, sweating, or urinating a lot.  Other illnesses, such as fever or infection.  Certain medicines, such as those that remove excess fluid from the body (diuretics). Symptoms of mild or moderate dehydration may include thirst, dry lips and mouth, and dizziness. Symptoms of severe dehydration may include increased heart rate, confusion, fainting, and not urinating. For severe dehydration, you may need to get fluids through an IV at the hospital. For mild or moderate dehydration, you can usually rehydrate at home by drinking certain fluids as told by your health care provider. What are the risks? Generally, rehydration is safe. However, taking in too much fluid (overhydration) can be a problem. This is rare. Overhydration can cause an electrolyte imbalance, kidney failure, or a decrease in salt (sodium) levels in the body. Supplies needed You will need an oral rehydration solution (ORS) if your health care provider tells you to use one. This is a drink to treat dehydration. It can be found in pharmacies and retail stores. How to rehydrate Fluids Follow instructions from your health care provider for rehydration. The kind of fluid and the amount you should drink depend on your condition. In general, you should choose drinks that you prefer.  If told by your health care provider, drink an ORS. ? Make an ORS by following instructions on the package. ? Start by drinking small amounts,  about  cup (120 mL) every 5-10 minutes. ? Slowly increase how much you drink until you have taken the amount recommended by your health care provider.  Drink enough clear fluids to keep your urine pale yellow. If you were told to drink an ORS, finish it first, then start slowly drinking other clear fluids. Drink fluids such as: ? Water. This includes sparkling water and flavored water. Drinking only water can lead to having too little sodium in your body (hyponatremia). Follow the advice of your health care provider. ? Water from ice chips you suck on. ? Fruit juice with water you add to it (diluted). ? Sports drinks. ? Hot or cold herbal teas. ? Broth-based soups. ? Milk or milk products. Food Follow instructions from your health care provider about what to eat while you rehydrate. Your health care provider may recommend that you slowly begin eating regular foods in small amounts.  Eat foods that contain a healthy balance of electrolytes, such as bananas, oranges, potatoes, tomatoes, and spinach.  Avoid foods that are greasy or contain a lot of sugar. In some cases, you may get nutrition through a feeding tube that is passed through your nose and into your stomach (nasogastric tube, or NG tube). This may be done if you have uncontrolled vomiting or diarrhea.   Beverages to avoid Certain beverages may make dehydration worse. While you rehydrate, avoid drinking alcohol.   How to tell if you are recovering from dehydration You may be recovering from dehydration if:  You are urinating more often than before you started rehydrating.  Your urine is pale yellow.  Your energy level   improves.  You vomit less frequently.  You have diarrhea less frequently.  Your appetite improves or returns to normal.  You feel less dizzy or less light-headed.  Your skin tone and color start to look more normal. Follow these instructions at home:  Take over-the-counter and prescription medicines only  as told by your health care provider.  Do not take sodium tablets. Doing this can lead to having too much sodium in your body (hypernatremia). Contact a health care provider if:  You continue to have symptoms of mild or moderate dehydration, such as: ? Thirst. ? Dry lips. ? Slightly dry mouth. ? Dizziness. ? Dark urine or less urine than normal. ? Muscle cramps.  You continue to vomit or have diarrhea. Get help right away if you:  Have symptoms of dehydration that get worse.  Have a fever.  Have a severe headache.  Have been vomiting and the following happens: ? Your vomiting gets worse or does not go away. ? Your vomit includes blood or green matter (bile). ? You cannot eat or drink without vomiting.  Have problems with urination or bowel movements, such as: ? Diarrhea that gets worse or does not go away. ? Blood in your stool (feces). This may cause stool to look black and tarry. ? Not urinating, or urinating only a small amount of very dark urine, within 6-8 hours.  Have trouble breathing.  Have symptoms that get worse with treatment. These symptoms may represent a serious problem that is an emergency. Do not wait to see if the symptoms will go away. Get medical help right away. Call your local emergency services (911 in the [place] Do not drive yourself to the hospital. Summary  Rehydration is the replacement of body fluids and minerals (electrolytes) that are lost during dehydration.  Follow instructions from your health care provider for rehydration. The kind of fluid and amount you should drink depend on your condition.  Slowly increase how much you drink until you have taken the amount recommended by your health care provider.  Contact your health care provider if you continue to show signs of mild or moderate dehydration. This information is not intended to replace advice given to you by your health care provider. Make sure you discuss any questions you have with  your health care provider. Document Revised: 08/10/2019 Document Reviewed: 06/20/2019 Elsevier Patient Education  2021 Elsevier Inc.  

## 2020-11-16 NOTE — Telephone Encounter (Signed)
Scheduled appointment per 05/26 los. Patient is aware. 

## 2020-11-17 ENCOUNTER — Inpatient Hospital Stay: Payer: No Typology Code available for payment source

## 2020-11-17 ENCOUNTER — Other Ambulatory Visit: Payer: Self-pay

## 2020-11-17 ENCOUNTER — Ambulatory Visit: Payer: No Typology Code available for payment source

## 2020-11-17 VITALS — BP 110/58 | HR 64 | Temp 97.6°F | Resp 18

## 2020-11-17 DIAGNOSIS — Z95828 Presence of other vascular implants and grafts: Secondary | ICD-10-CM

## 2020-11-17 DIAGNOSIS — Z5111 Encounter for antineoplastic chemotherapy: Secondary | ICD-10-CM | POA: Diagnosis not present

## 2020-11-17 DIAGNOSIS — C50411 Malignant neoplasm of upper-outer quadrant of right female breast: Secondary | ICD-10-CM

## 2020-11-17 MED ORDER — SODIUM CHLORIDE 0.9% FLUSH
10.0000 mL | Freq: Once | INTRAVENOUS | Status: AC
Start: 2020-11-17 — End: 2020-11-17
  Administered 2020-11-17: 10 mL
  Filled 2020-11-17: qty 10

## 2020-11-17 MED ORDER — HEPARIN SOD (PORK) LOCK FLUSH 100 UNIT/ML IV SOLN
500.0000 [IU] | Freq: Once | INTRAVENOUS | Status: AC
Start: 2020-11-17 — End: 2020-11-17
  Administered 2020-11-17: 500 [IU]
  Filled 2020-11-17: qty 5

## 2020-11-17 MED ORDER — PEGFILGRASTIM-BMEZ 6 MG/0.6ML ~~LOC~~ SOSY: 6.0000 mg | PREFILLED_SYRINGE | Freq: Once | SUBCUTANEOUS | Status: DC

## 2020-11-17 MED ORDER — SODIUM CHLORIDE 0.9 % IV SOLN
INTRAVENOUS | Status: DC
  Filled 2020-11-17 (×2): qty 250

## 2020-11-17 NOTE — Progress Notes (Signed)
Pt came for 1L NS IVF infusion today.   Pt declined to have pegfilgrastim booster stating that is makes her feel too tired, weak, with a decreased appetite and joint pain in her knees. Pt states that she took claritin/tylenol with the injection and "it doesn't help".   Pt is aware to call [facility] with any fevers, s/s infection. Pt agrees to wear mask, wash hands frequently and limit visitors. MD office made aware by this RN

## 2020-11-17 NOTE — Patient Instructions (Signed)
Dehydration, Adult Dehydration is a condition in which there is not enough water or other fluids in the body. This happens when a person loses more fluids than he or she takes in. Important organs, such as the kidneys, brain, and heart, cannot function without a proper amount of fluids. Any loss of fluids from the body can lead to dehydration. Dehydration can be mild, moderate, or severe. It should be treated right away to prevent it from becoming severe. What are the causes? Dehydration may be caused by:  Conditions that cause loss of water or other fluids, such as diarrhea, vomiting, or sweating or urinating a lot.  Not drinking enough fluids, especially when you are ill or doing activities that require a lot of energy.  Other illnesses and conditions, such as fever or infection.  Certain medicines, such as medicines that remove excess fluid from the body (diuretics).  Lack of safe drinking water.  Not being able to get enough water and food. What increases the risk? The following factors may make you more likely to develop this condition:  Having a long-term (chronic) illness that has not been treated properly, such as diabetes, heart disease, or kidney disease.  Being 65 years of age or older.  Having a disability.  Living in a place that is high in altitude, where thinner, drier air causes more fluid loss.  Doing exercises that put stress on your body for a long time (endurance sports). What are the signs or symptoms? Symptoms of dehydration depend on how severe it is. Mild or moderate dehydration  Thirst.  Dry lips or dry mouth.  Dizziness or light-headedness, especially when standing up from a seated position.  Muscle cramps.  Dark urine. Urine may be the color of tea.  Less urine or tears produced than usual.  Headache. Severe dehydration  Changes in skin. Your skin may be cold and clammy, blotchy, or pale. Your skin also may not return to normal after being  lightly pinched and released.  Little or no tears, urine, or sweat.  Changes in vital signs, such as rapid breathing and low blood pressure. Your pulse may be weak or may be faster than 100 beats a minute when you are sitting still.  Other changes, such as: ? Feeling very thirsty. ? Sunken eyes. ? Cold hands and feet. ? Confusion. ? Being very tired (lethargic) or having trouble waking from sleep. ? Short-term weight loss. ? Loss of consciousness. How is this diagnosed? This condition is diagnosed based on your symptoms and a physical exam. You may have blood and urine tests to help confirm the diagnosis. How is this treated? Treatment for this condition depends on how severe it is. Treatment should be started right away. Do not wait until dehydration becomes severe. Severe dehydration is an emergency and needs to be treated in a hospital.  Mild or moderate dehydration can be treated at home. You may be asked to: ? Drink more fluids. ? Drink an oral rehydration solution (ORS). This drink helps restore proper amounts of fluids and salts and minerals in the blood (electrolytes).  Severe dehydration can be treated: ? With IV fluids. ? By correcting abnormal levels of electrolytes. This is often done by giving electrolytes through a tube that is passed through your nose and into your stomach (nasogastric tube, or NG tube). ? By treating the underlying cause of dehydration. Follow these instructions at home: Oral rehydration solution If told by your health care provider, drink an ORS:  Make   an ORS by following instructions on the package.  Start by drinking small amounts, about  cup (120 mL) every 5-10 minutes.  Slowly increase how much you drink until you have taken the amount recommended by your health care provider. Eating and drinking  Drink enough clear fluid to keep your urine pale yellow. If you were told to drink an ORS, finish the ORS first and then start slowly drinking  other clear fluids. Drink fluids such as: ? Water. Do not drink only water. Doing that can lead to hyponatremia, which is having too little salt (sodium) in the body. ? Water from ice chips you suck on. ? Fruit juice that you have added water to (diluted fruit juice). ? Low-calorie sports drinks.  Eat foods that contain a healthy balance of electrolytes, such as bananas, oranges, potatoes, tomatoes, and spinach.  Do not drink alcohol.  Avoid the following: ? Drinks that contain a lot of sugar. These include high-calorie sports drinks, fruit juice that is not diluted, and soda. ? Caffeine. ? Foods that are greasy or contain a lot of fat or sugar.         General instructions  Take over-the-counter and prescription medicines only as told by your health care provider.  Do not take sodium tablets. Doing that can lead to having too much sodium in the body (hypernatremia).  Return to your normal activities as told by your health care provider. Ask your health care provider what activities are safe for you.  Keep all follow-up visits as told by your health care provider. This is important. Contact a health care provider if:  You have muscle cramps, pain, or discomfort, such as: ? Pain in your abdomen and the pain gets worse or stays in one area (localizes). ? Stiff neck.  You have a rash.  You are more irritable than usual.  You are sleepier or have a harder time waking than usual.  You feel weak or dizzy.  You feel very thirsty. Get help right away if you have:  Any symptoms of severe dehydration.  Symptoms of vomiting, such as: ? You cannot eat or drink without vomiting. ? Vomiting gets worse or does not go away. ? Vomit includes blood or green matter (bile).  Symptoms that get worse with treatment.  A fever.  A severe headache.  Problems with urination or bowel movements, such as: ? Diarrhea that gets worse or does not go away. ? Blood in your stool (feces).  This may cause stool to look black and tarry. ? Not urinating, or urinating only a small amount of very dark urine, within 6-8 hours.  Trouble breathing. These symptoms may represent a serious problem that is an emergency. Do not wait to see if the symptoms will go away. Get medical help right away. Call your local emergency services (911 in the [place] Do not drive yourself to the hospital. Summary  Dehydration is a condition in which there is not enough water or other fluids in the body. This happens when a person loses more fluids than he or she takes in.  Treatment for this condition depends on how severe it is. Treatment should be started right away. Do not wait until dehydration becomes severe.  Drink enough clear fluid to keep your urine pale yellow. If you were told to drink an oral rehydration solution (ORS), finish the ORS first and then start slowly drinking other clear fluids.  Take over-the-counter and prescription medicines only as told by your health   care provider.  Get help right away if you have any symptoms of severe dehydration. This information is not intended to replace advice given to you by your health care provider. Make sure you discuss any questions you have with your health care provider. Document Revised: 01/20/2019 Document Reviewed: 01/20/2019 Elsevier Patient Education  2021 [employer] Inc.  

## 2020-11-19 ENCOUNTER — Inpatient Hospital Stay: Payer: No Typology Code available for payment source

## 2020-11-19 ENCOUNTER — Encounter: Payer: Self-pay | Admitting: Oncology

## 2020-11-20 ENCOUNTER — Other Ambulatory Visit: Payer: Self-pay | Admitting: Oncology

## 2020-11-20 ENCOUNTER — Encounter: Payer: Self-pay | Admitting: *Deleted

## 2020-11-20 ENCOUNTER — Other Ambulatory Visit: Payer: Self-pay

## 2020-11-20 ENCOUNTER — Inpatient Hospital Stay: Payer: No Typology Code available for payment source

## 2020-11-20 VITALS — BP 118/52 | HR 85 | Temp 98.7°F | Resp 18

## 2020-11-20 DIAGNOSIS — Z5111 Encounter for antineoplastic chemotherapy: Secondary | ICD-10-CM | POA: Diagnosis not present

## 2020-11-20 DIAGNOSIS — Z95828 Presence of other vascular implants and grafts: Secondary | ICD-10-CM

## 2020-11-20 MED ORDER — CIPROFLOXACIN HCL 500 MG PO TABS: 500.0000 mg | ORAL_TABLET | Freq: Two times a day (BID) | ORAL | 0 refills | Status: DC

## 2020-11-20 MED ORDER — SODIUM CHLORIDE 0.9 % IV SOLN
Freq: Once | INTRAVENOUS | Status: AC
  Filled 2020-11-20: qty 250

## 2020-11-20 MED ORDER — SODIUM CHLORIDE 0.9% FLUSH
10.0000 mL | Freq: Once | INTRAVENOUS | Status: AC
  Administered 2020-11-20: 10 mL
  Filled 2020-11-20: qty 10

## 2020-11-20 MED ORDER — HEPARIN SOD (PORK) LOCK FLUSH 100 UNIT/ML IV SOLN
250.0000 [IU] | Freq: Once | INTRAVENOUS | Status: AC
  Administered 2020-11-20: 500 [IU]
  Filled 2020-11-20: qty 5

## 2020-11-20 NOTE — Patient Instructions (Signed)
Dehydration, Adult Dehydration is a condition in which there is not enough water or other fluids in the body. This happens when a person loses more fluids than he or she takes in. Important organs, such as the kidneys, brain, and heart, cannot function without a proper amount of fluids. Any loss of fluids from the body can lead to dehydration. Dehydration can be mild, moderate, or severe. It should be treated right away to prevent it from becoming severe. What are the causes? Dehydration may be caused by:  Conditions that cause loss of water or other fluids, such as diarrhea, vomiting, or sweating or urinating a lot.  Not drinking enough fluids, especially when you are ill or doing activities that require a lot of energy.  Other illnesses and conditions, such as fever or infection.  Certain medicines, such as medicines that remove excess fluid from the body (diuretics).  Lack of safe drinking water.  Not being able to get enough water and food. What increases the risk? The following factors may make you more likely to develop this condition:  Having a long-term (chronic) illness that has not been treated properly, such as diabetes, heart disease, or kidney disease.  Being 65 years of age or older.  Having a disability.  Living in a place that is high in altitude, where thinner, drier air causes more fluid loss.  Doing exercises that put stress on your body for a long time (endurance sports). What are the signs or symptoms? Symptoms of dehydration depend on how severe it is. Mild or moderate dehydration  Thirst.  Dry lips or dry mouth.  Dizziness or light-headedness, especially when standing up from a seated position.  Muscle cramps.  Dark urine. Urine may be the color of tea.  Less urine or tears produced than usual.  Headache. Severe dehydration  Changes in skin. Your skin may be cold and clammy, blotchy, or pale. Your skin also may not return to normal after being  lightly pinched and released.  Little or no tears, urine, or sweat.  Changes in vital signs, such as rapid breathing and low blood pressure. Your pulse may be weak or may be faster than 100 beats a minute when you are sitting still.  Other changes, such as: ? Feeling very thirsty. ? Sunken eyes. ? Cold hands and feet. ? Confusion. ? Being very tired (lethargic) or having trouble waking from sleep. ? Short-term weight loss. ? Loss of consciousness. How is this diagnosed? This condition is diagnosed based on your symptoms and a physical exam. You may have blood and urine tests to help confirm the diagnosis. How is this treated? Treatment for this condition depends on how severe it is. Treatment should be started right away. Do not wait until dehydration becomes severe. Severe dehydration is an emergency and needs to be treated in a hospital.  Mild or moderate dehydration can be treated at home. You may be asked to: ? Drink more fluids. ? Drink an oral rehydration solution (ORS). This drink helps restore proper amounts of fluids and salts and minerals in the blood (electrolytes).  Severe dehydration can be treated: ? With IV fluids. ? By correcting abnormal levels of electrolytes. This is often done by giving electrolytes through a tube that is passed through your nose and into your stomach (nasogastric tube, or NG tube). ? By treating the underlying cause of dehydration. Follow these instructions at home: Oral rehydration solution If told by your health care provider, drink an ORS:  Make   an ORS by following instructions on the package.  Start by drinking small amounts, about  cup (120 mL) every 5-10 minutes.  Slowly increase how much you drink until you have taken the amount recommended by your health care provider. Eating and drinking  Drink enough clear fluid to keep your urine pale yellow. If you were told to drink an ORS, finish the ORS first and then start slowly drinking  other clear fluids. Drink fluids such as: ? Water. Do not drink only water. Doing that can lead to hyponatremia, which is having too little salt (sodium) in the body. ? Water from ice chips you suck on. ? Fruit juice that you have added water to (diluted fruit juice). ? Low-calorie sports drinks.  Eat foods that contain a healthy balance of electrolytes, such as bananas, oranges, potatoes, tomatoes, and spinach.  Do not drink alcohol.  Avoid the following: ? Drinks that contain a lot of sugar. These include high-calorie sports drinks, fruit juice that is not diluted, and soda. ? Caffeine. ? Foods that are greasy or contain a lot of fat or sugar.         General instructions  Take over-the-counter and prescription medicines only as told by your health care provider.  Do not take sodium tablets. Doing that can lead to having too much sodium in the body (hypernatremia).  Return to your normal activities as told by your health care provider. Ask your health care provider what activities are safe for you.  Keep all follow-up visits as told by your health care provider. This is important. Contact a health care provider if:  You have muscle cramps, pain, or discomfort, such as: ? Pain in your abdomen and the pain gets worse or stays in one area (localizes). ? Stiff neck.  You have a rash.  You are more irritable than usual.  You are sleepier or have a harder time waking than usual.  You feel weak or dizzy.  You feel very thirsty. Get help right away if you have:  Any symptoms of severe dehydration.  Symptoms of vomiting, such as: ? You cannot eat or drink without vomiting. ? Vomiting gets worse or does not go away. ? Vomit includes blood or green matter (bile).  Symptoms that get worse with treatment.  A fever.  A severe headache.  Problems with urination or bowel movements, such as: ? Diarrhea that gets worse or does not go away. ? Blood in your stool (feces).  This may cause stool to look black and tarry. ? Not urinating, or urinating only a small amount of very dark urine, within 6-8 hours.  Trouble breathing. These symptoms may represent a serious problem that is an emergency. Do not wait to see if the symptoms will go away. Get medical help right away. Call your local emergency services (911 in the [place] Do not drive yourself to the hospital. Summary  Dehydration is a condition in which there is not enough water or other fluids in the body. This happens when a person loses more fluids than he or she takes in.  Treatment for this condition depends on how severe it is. Treatment should be started right away. Do not wait until dehydration becomes severe.  Drink enough clear fluid to keep your urine pale yellow. If you were told to drink an oral rehydration solution (ORS), finish the ORS first and then start slowly drinking other clear fluids.  Take over-the-counter and prescription medicines only as told by your health   care provider.  Get help right away if you have any symptoms of severe dehydration. This information is not intended to replace advice given to you by your health care provider. Make sure you discuss any questions you have with your health care provider. Document Revised: 01/20/2019 Document Reviewed: 01/20/2019 Elsevier Patient Education  2021 [employer] Inc.  

## 2020-11-20 NOTE — Progress Notes (Signed)
Melissa Mcgrath did not receive her PEG filgrastim after her fourth cycle of chemo because she says it makes her feel bad.  We are of course switching to a different type of chemo and she will not need it subsequently.  She is here today for fluids.  I discussed all this with her.  She will be on Cipro for 5 days beginning 11/22/2020.

## 2020-11-21 ENCOUNTER — Inpatient Hospital Stay: Payer: No Typology Code available for payment source | Attending: Adult Health

## 2020-11-21 ENCOUNTER — Telehealth: Payer: Self-pay | Admitting: Oncology

## 2020-11-21 VITALS — BP 104/70 | HR 85 | Temp 97.6°F | Resp 18

## 2020-11-21 DIAGNOSIS — Z803 Family history of malignant neoplasm of breast: Secondary | ICD-10-CM | POA: Insufficient documentation

## 2020-11-21 DIAGNOSIS — Z5111 Encounter for antineoplastic chemotherapy: Secondary | ICD-10-CM | POA: Insufficient documentation

## 2020-11-21 DIAGNOSIS — Z800 Family history of malignant neoplasm of digestive organs: Secondary | ICD-10-CM | POA: Insufficient documentation

## 2020-11-21 DIAGNOSIS — Z8249 Family history of ischemic heart disease and other diseases of the circulatory system: Secondary | ICD-10-CM | POA: Insufficient documentation

## 2020-11-21 DIAGNOSIS — Z808 Family history of malignant neoplasm of other organs or systems: Secondary | ICD-10-CM | POA: Diagnosis not present

## 2020-11-21 DIAGNOSIS — Z8041 Family history of malignant neoplasm of ovary: Secondary | ICD-10-CM | POA: Insufficient documentation

## 2020-11-21 DIAGNOSIS — Z833 Family history of diabetes mellitus: Secondary | ICD-10-CM | POA: Diagnosis not present

## 2020-11-21 DIAGNOSIS — Z95828 Presence of other vascular implants and grafts: Secondary | ICD-10-CM

## 2020-11-21 DIAGNOSIS — C50411 Malignant neoplasm of upper-outer quadrant of right female breast: Secondary | ICD-10-CM | POA: Diagnosis present

## 2020-11-21 DIAGNOSIS — Z170 Estrogen receptor positive status [ER+]: Secondary | ICD-10-CM | POA: Diagnosis not present

## 2020-11-21 DIAGNOSIS — Z818 Family history of other mental and behavioral disorders: Secondary | ICD-10-CM | POA: Insufficient documentation

## 2020-11-21 DIAGNOSIS — C773 Secondary and unspecified malignant neoplasm of axilla and upper limb lymph nodes: Secondary | ICD-10-CM | POA: Diagnosis not present

## 2020-11-21 DIAGNOSIS — Z8269 Family history of other diseases of the musculoskeletal system and connective tissue: Secondary | ICD-10-CM | POA: Diagnosis not present

## 2020-11-21 DIAGNOSIS — Z79899 Other long term (current) drug therapy: Secondary | ICD-10-CM | POA: Insufficient documentation

## 2020-11-21 MED ORDER — ONDANSETRON HCL 4 MG/2ML IJ SOLN
8.0000 mg | Freq: Once | INTRAMUSCULAR | Status: AC
Start: 2020-11-21 — End: 2020-11-21
  Administered 2020-11-21: 8 mg via INTRAVENOUS

## 2020-11-21 MED ORDER — SODIUM CHLORIDE 0.9 % IV SOLN
INTRAVENOUS | Status: DC
  Filled 2020-11-21 (×2): qty 250

## 2020-11-21 MED ORDER — HEPARIN SOD (PORK) LOCK FLUSH 100 UNIT/ML IV SOLN
500.0000 [IU] | Freq: Once | INTRAVENOUS | Status: DC
  Filled 2020-11-21: qty 5

## 2020-11-21 MED ORDER — SODIUM CHLORIDE 0.9% FLUSH
10.0000 mL | Freq: Once | INTRAVENOUS | Status: DC
  Filled 2020-11-21: qty 10

## 2020-11-21 MED ORDER — ONDANSETRON HCL 4 MG/2ML IJ SOLN
INTRAMUSCULAR | Status: AC
  Filled 2020-11-21: qty 4

## 2020-11-21 MED ORDER — SODIUM CHLORIDE 0.9 % IV SOLN: 8.0000 mg | Freq: Once | INTRAVENOUS | Status: DC

## 2020-11-21 NOTE — Patient Instructions (Signed)
Dehydration, Adult Dehydration is a condition in which there is not enough water or other fluids in the body. This happens when a person loses more fluids than he or she takes in. Important organs, such as the kidneys, brain, and heart, cannot function without a proper amount of fluids. Any loss of fluids from the body can lead to dehydration. Dehydration can be mild, moderate, or severe. It should be treated right away to prevent it from becoming severe. What are the causes? Dehydration may be caused by:  Conditions that cause loss of water or other fluids, such as diarrhea, vomiting, or sweating or urinating a lot.  Not drinking enough fluids, especially when you are ill or doing activities that require a lot of energy.  Other illnesses and conditions, such as fever or infection.  Certain medicines, such as medicines that remove excess fluid from the body (diuretics).  Lack of safe drinking water.  Not being able to get enough water and food. What increases the risk? The following factors may make you more likely to develop this condition:  Having a long-term (chronic) illness that has not been treated properly, such as diabetes, heart disease, or kidney disease.  Being 65 years of age or older.  Having a disability.  Living in a place that is high in altitude, where thinner, drier air causes more fluid loss.  Doing exercises that put stress on your body for a long time (endurance sports). What are the signs or symptoms? Symptoms of dehydration depend on how severe it is. Mild or moderate dehydration  Thirst.  Dry lips or dry mouth.  Dizziness or light-headedness, especially when standing up from a seated position.  Muscle cramps.  Dark urine. Urine may be the color of tea.  Less urine or tears produced than usual.  Headache. Severe dehydration  Changes in skin. Your skin may be cold and clammy, blotchy, or pale. Your skin also may not return to normal after being  lightly pinched and released.  Little or no tears, urine, or sweat.  Changes in vital signs, such as rapid breathing and low blood pressure. Your pulse may be weak or may be faster than 100 beats a minute when you are sitting still.  Other changes, such as: ? Feeling very thirsty. ? Sunken eyes. ? Cold hands and feet. ? Confusion. ? Being very tired (lethargic) or having trouble waking from sleep. ? Short-term weight loss. ? Loss of consciousness. How is this diagnosed? This condition is diagnosed based on your symptoms and a physical exam. You may have blood and urine tests to help confirm the diagnosis. How is this treated? Treatment for this condition depends on how severe it is. Treatment should be started right away. Do not wait until dehydration becomes severe. Severe dehydration is an emergency and needs to be treated in a hospital.  Mild or moderate dehydration can be treated at home. You may be asked to: ? Drink more fluids. ? Drink an oral rehydration solution (ORS). This drink helps restore proper amounts of fluids and salts and minerals in the blood (electrolytes).  Severe dehydration can be treated: ? With IV fluids. ? By correcting abnormal levels of electrolytes. This is often done by giving electrolytes through a tube that is passed through your nose and into your stomach (nasogastric tube, or NG tube). ? By treating the underlying cause of dehydration. Follow these instructions at home: Oral rehydration solution If told by your health care provider, drink an ORS:  Make   an ORS by following instructions on the package.  Start by drinking small amounts, about  cup (120 mL) every 5-10 minutes.  Slowly increase how much you drink until you have taken the amount recommended by your health care provider. Eating and drinking  Drink enough clear fluid to keep your urine pale yellow. If you were told to drink an ORS, finish the ORS first and then start slowly drinking  other clear fluids. Drink fluids such as: ? Water. Do not drink only water. Doing that can lead to hyponatremia, which is having too little salt (sodium) in the body. ? Water from ice chips you suck on. ? Fruit juice that you have added water to (diluted fruit juice). ? Low-calorie sports drinks.  Eat foods that contain a healthy balance of electrolytes, such as bananas, oranges, potatoes, tomatoes, and spinach.  Do not drink alcohol.  Avoid the following: ? Drinks that contain a lot of sugar. These include high-calorie sports drinks, fruit juice that is not diluted, and soda. ? Caffeine. ? Foods that are greasy or contain a lot of fat or sugar.         General instructions  Take over-the-counter and prescription medicines only as told by your health care provider.  Do not take sodium tablets. Doing that can lead to having too much sodium in the body (hypernatremia).  Return to your normal activities as told by your health care provider. Ask your health care provider what activities are safe for you.  Keep all follow-up visits as told by your health care provider. This is important. Contact a health care provider if:  You have muscle cramps, pain, or discomfort, such as: ? Pain in your abdomen and the pain gets worse or stays in one area (localizes). ? Stiff neck.  You have a rash.  You are more irritable than usual.  You are sleepier or have a harder time waking than usual.  You feel weak or dizzy.  You feel very thirsty. Get help right away if you have:  Any symptoms of severe dehydration.  Symptoms of vomiting, such as: ? You cannot eat or drink without vomiting. ? Vomiting gets worse or does not go away. ? Vomit includes blood or green matter (bile).  Symptoms that get worse with treatment.  A fever.  A severe headache.  Problems with urination or bowel movements, such as: ? Diarrhea that gets worse or does not go away. ? Blood in your stool (feces).  This may cause stool to look black and tarry. ? Not urinating, or urinating only a small amount of very dark urine, within 6-8 hours.  Trouble breathing. These symptoms may represent a serious problem that is an emergency. Do not wait to see if the symptoms will go away. Get medical help right away. Call your local emergency services (911 in the [place] Do not drive yourself to the hospital. Summary  Dehydration is a condition in which there is not enough water or other fluids in the body. This happens when a person loses more fluids than he or she takes in.  Treatment for this condition depends on how severe it is. Treatment should be started right away. Do not wait until dehydration becomes severe.  Drink enough clear fluid to keep your urine pale yellow. If you were told to drink an oral rehydration solution (ORS), finish the ORS first and then start slowly drinking other clear fluids.  Take over-the-counter and prescription medicines only as told by your health   care provider.  Get help right away if you have any symptoms of severe dehydration. This information is not intended to replace advice given to you by your health care provider. Make sure you discuss any questions you have with your health care provider. Document Revised: 01/20/2019 Document Reviewed: 01/20/2019 Elsevier Patient Education  2021 [employer] Inc.  

## 2020-11-21 NOTE — Telephone Encounter (Signed)
Scheduled appts per 5/26 sch msg. Called pt, no answer. Left msg with appt date and time.

## 2020-11-22 ENCOUNTER — Other Ambulatory Visit: Payer: Self-pay | Admitting: Medical Oncology

## 2020-11-22 ENCOUNTER — Other Ambulatory Visit: Payer: Self-pay

## 2020-11-22 ENCOUNTER — Inpatient Hospital Stay: Payer: No Typology Code available for payment source

## 2020-11-22 DIAGNOSIS — Z5111 Encounter for antineoplastic chemotherapy: Secondary | ICD-10-CM | POA: Diagnosis not present

## 2020-11-22 DIAGNOSIS — E860 Dehydration: Secondary | ICD-10-CM

## 2020-11-22 DIAGNOSIS — Z95828 Presence of other vascular implants and grafts: Secondary | ICD-10-CM

## 2020-11-22 MED ORDER — SODIUM CHLORIDE 0.9 % IV SOLN
INTRAVENOUS | Status: DC
  Filled 2020-11-22 (×2): qty 250

## 2020-11-22 MED ORDER — ONDANSETRON HCL 4 MG/2ML IJ SOLN
INTRAMUSCULAR | Status: AC
  Filled 2020-11-22: qty 4

## 2020-11-22 MED ORDER — SODIUM CHLORIDE 0.9 % IV SOLN: 8.0000 mg | Freq: Once | INTRAVENOUS | Status: DC

## 2020-11-22 MED ORDER — HEPARIN SOD (PORK) LOCK FLUSH 100 UNIT/ML IV SOLN
500.0000 [IU] | Freq: Once | INTRAVENOUS | Status: AC
  Administered 2020-11-22: 500 [IU]
  Filled 2020-11-22: qty 5

## 2020-11-22 MED ORDER — SODIUM CHLORIDE 0.9% FLUSH
10.0000 mL | Freq: Once | INTRAVENOUS | Status: AC
  Administered 2020-11-22: 10 mL
  Filled 2020-11-22: qty 10

## 2020-11-22 MED ORDER — ONDANSETRON HCL 4 MG/2ML IJ SOLN
8.0000 mg | Freq: Once | INTRAMUSCULAR | Status: AC
Start: 2020-11-22 — End: 2020-11-22
  Administered 2020-11-22: 8 mg via INTRAVENOUS

## 2020-12-07 ENCOUNTER — Other Ambulatory Visit: Payer: Self-pay

## 2020-12-07 ENCOUNTER — Encounter: Payer: Self-pay | Admitting: Adult Health

## 2020-12-07 ENCOUNTER — Inpatient Hospital Stay: Payer: No Typology Code available for payment source

## 2020-12-07 ENCOUNTER — Inpatient Hospital Stay (HOSPITAL_BASED_OUTPATIENT_CLINIC_OR_DEPARTMENT_OTHER): Payer: No Typology Code available for payment source | Admitting: Adult Health

## 2020-12-07 VITALS — BP 112/65 | HR 98 | Temp 97.6°F | Resp 17 | Ht 68.0 in | Wt 134.7 lb

## 2020-12-07 VITALS — BP 130/64 | HR 82 | Resp 16

## 2020-12-07 DIAGNOSIS — C50411 Malignant neoplasm of upper-outer quadrant of right female breast: Secondary | ICD-10-CM

## 2020-12-07 DIAGNOSIS — Z170 Estrogen receptor positive status [ER+]: Secondary | ICD-10-CM

## 2020-12-07 DIAGNOSIS — Z5111 Encounter for antineoplastic chemotherapy: Secondary | ICD-10-CM | POA: Diagnosis not present

## 2020-12-07 LAB — CBC WITH DIFFERENTIAL/PLATELET
Abs Immature Granulocytes: 0.05 10*3/uL (ref 0.00–0.07)
Basophils Absolute: 0.1 10*3/uL (ref 0.0–0.1)
Basophils Relative: 2 %
Eosinophils Absolute: 0 10*3/uL (ref 0.0–0.5)
Eosinophils Relative: 0 %
HCT: 31.8 % — ABNORMAL LOW (ref 36.0–46.0)
Hemoglobin: 10.4 g/dL — ABNORMAL LOW (ref 12.0–15.0)
Immature Granulocytes: 1 %
Lymphocytes Relative: 17 %
Lymphs Abs: 0.8 10*3/uL (ref 0.7–4.0)
MCH: 32.9 pg (ref 26.0–34.0)
MCHC: 32.7 g/dL (ref 30.0–36.0)
MCV: 100.6 fL — ABNORMAL HIGH (ref 80.0–100.0)
Monocytes Absolute: 1.1 10*3/uL — ABNORMAL HIGH (ref 0.1–1.0)
Monocytes Relative: 25 %
Neutro Abs: 2.5 10*3/uL (ref 1.7–7.7)
Neutrophils Relative %: 55 %
Platelets: 381 10*3/uL (ref 150–400)
RBC: 3.16 MIL/uL — ABNORMAL LOW (ref 3.87–5.11)
RDW: 15.8 % — ABNORMAL HIGH (ref 11.5–15.5)
WBC: 4.5 10*3/uL (ref 4.0–10.5)
nRBC: 0 % (ref 0.0–0.2)

## 2020-12-07 LAB — CMP (CANCER CENTER ONLY)
ALT: 19 U/L (ref 0–44)
AST: 21 U/L (ref 15–41)
Albumin: 3.8 g/dL (ref 3.5–5.0)
Alkaline Phosphatase: 62 U/L (ref 38–126)
Anion gap: 6 (ref 5–15)
BUN: 11 mg/dL (ref 6–20)
CO2: 26 mmol/L (ref 22–32)
Calcium: 9.3 mg/dL (ref 8.9–10.3)
Chloride: 109 mmol/L (ref 98–111)
Creatinine: 0.79 mg/dL (ref 0.44–1.00)
GFR, Estimated: >60 mL/min (ref >60)
Glucose, Bld: 93 mg/dL (ref 70–99)
Potassium: 3.8 mmol/L (ref 3.5–5.1)
Sodium: 141 mmol/L (ref 135–145)
Total Bilirubin: 0.5 mg/dL (ref 0.3–1.2)
Total Protein: 6.7 g/dL (ref 6.5–8.1)

## 2020-12-07 MED ORDER — SODIUM CHLORIDE 0.9 % IV SOLN
Freq: Once | INTRAVENOUS | Status: AC
  Filled 2020-12-07: qty 250

## 2020-12-07 MED ORDER — DIPHENHYDRAMINE HCL 50 MG/ML IJ SOLN
INTRAMUSCULAR | Status: AC
  Filled 2020-12-07: qty 1

## 2020-12-07 MED ORDER — SODIUM CHLORIDE 0.9 % IV SOLN
80.0000 mg/m2 | Freq: Once | INTRAVENOUS | Status: AC
  Administered 2020-12-07: 138 mg via INTRAVENOUS
  Filled 2020-12-07: qty 23

## 2020-12-07 MED ORDER — SODIUM CHLORIDE 0.9% FLUSH
10.0000 mL | INTRAVENOUS | Status: DC | PRN
  Filled 2020-12-07: qty 10

## 2020-12-07 MED ORDER — FAMOTIDINE 20 MG IN NS 100 ML IVPB
INTRAVENOUS | Status: AC
  Filled 2020-12-07: qty 100

## 2020-12-07 MED ORDER — DEXAMETHASONE SODIUM PHOSPHATE 100 MG/10ML IJ SOLN
20.0000 mg | Freq: Once | INTRAMUSCULAR | Status: AC
  Administered 2020-12-07: 20 mg via INTRAVENOUS
  Filled 2020-12-07: qty 20

## 2020-12-07 MED ORDER — DIPHENHYDRAMINE HCL 50 MG/ML IJ SOLN
25.0000 mg | Freq: Once | INTRAMUSCULAR | Status: AC
  Administered 2020-12-07: 25 mg via INTRAVENOUS

## 2020-12-07 MED ORDER — HEPARIN SOD (PORK) LOCK FLUSH 100 UNIT/ML IV SOLN
500.0000 [IU] | Freq: Once | INTRAVENOUS | Status: DC | PRN
  Filled 2020-12-07: qty 5

## 2020-12-07 MED ORDER — FAMOTIDINE 20 MG IN NS 100 ML IVPB
20.0000 mg | Freq: Once | INTRAVENOUS | Status: AC
Start: 2020-12-07 — End: 2020-12-07
  Administered 2020-12-07: 20 mg via INTRAVENOUS

## 2020-12-07 NOTE — Patient Instructions (Addendum)
[facility]  Discharge Instructions: Thank you for choosing [facility] to provide your oncology and hematology care.   If you have a lab appointment with the [facility], please go directly to the [facility] and check in at the registration area.   Wear comfortable clothing and clothing appropriate for easy access to any Portacath or PICC line.   We strive to give you quality time with your provider. You may need to reschedule your appointment if you arrive late (15 or more minutes).  Arriving late affects you and other patients whose appointments are after yours.  Also, if you miss three or more appointments without notifying the office, you may be dismissed from the clinic at the provider's discretion.      For prescription refill requests, have your pharmacy contact our office and allow 72 hours for refills to be completed.    Today you received the following chemotherapy and/or immunotherapy agents: Taxol.      To help prevent nausea and vomiting after your treatment, we encourage you to take your nausea medication as directed.  BELOW ARE SYMPTOMS THAT SHOULD BE REPORTED IMMEDIATELY: *FEVER GREATER THAN 100.4 F (38 C) OR HIGHER *CHILLS OR SWEATING *NAUSEA AND VOMITING THAT IS NOT CONTROLLED WITH YOUR NAUSEA MEDICATION *UNUSUAL SHORTNESS OF BREATH *UNUSUAL BRUISING OR BLEEDING *URINARY PROBLEMS (pain or burning when urinating, or frequent urination) *BOWEL PROBLEMS (unusual diarrhea, constipation, pain near the anus) TENDERNESS IN MOUTH AND THROAT WITH OR WITHOUT PRESENCE OF ULCERS (sore throat, sores in mouth, or a toothache) UNUSUAL RASH, SWELLING OR PAIN  UNUSUAL VAGINAL DISCHARGE OR ITCHING   Items with * indicate a potential emergency and should be followed up as soon as possible or go to the Emergency Department if any problems should occur.  Please show the CHEMOTHERAPY ALERT CARD or IMMUNOTHERAPY ALERT CARD at check-in to the  Emergency Department and triage nurse.  Should you have questions after your visit or need to cancel or reschedule your appointment, please contact [facility]  Dept: [phone]  and follow the prompts.  Office hours are 8:00 a.m. to 4:30 p.m. Monday - Friday. Please note that voicemails left after 4:00 p.m. may not be returned until the following business day.  We are closed weekends and major holidays. You have access to a nurse at all times for urgent questions. Please call the main number to the clinic Dept: [phone] and follow the prompts.   For any non-urgent questions, you may also contact your provider using MyChart. We now offer e-Visits for anyone 40 and older to request care online for non-urgent symptoms. For details visit [email or web address].   Also download the MyChart app! Go to the app store, search "MyChart", open the app, select [facility], and log in with your MyChart username and password.  Due to Covid, a mask is required upon entering the hospital/clinic. If you do not have a mask, one will be given to you upon arrival. For doctor visits, patients may have 1 support person aged 82 or older with them. For treatment visits, patients cannot have anyone with them due to current Covid guidelines and our immunocompromised population.   Paclitaxel injection What is this medication? PACLITAXEL (PAK li TAX el) is a chemotherapy drug. It targets fast dividing cells, like cancer cells, and causes these cells to die. This medicine is used to treat ovarian cancer, breast cancer, lung cancer, Kaposi's sarcoma, andother cancers. This medicine may be  used for other purposes; ask your health care provider orpharmacist if you have questions. COMMON BRAND NAME(S): Onxol, Taxol What should I tell my care team before I take this medication? They need to know if you have any of these conditions: history of irregular heartbeat liver disease low blood  counts, like low white cell, platelet, or red cell counts lung or breathing disease, like asthma tingling of the fingers or toes, or other nerve disorder an unusual or allergic reaction to paclitaxel, alcohol, polyoxyethylated castor oil, other chemotherapy, other medicines, foods, dyes, or preservatives pregnant or trying to get pregnant breast-feeding How should I use this medication? This drug is given as an infusion into a vein. It is administered in a hospitalor clinic by a specially trained health care professional. Talk to your pediatrician regarding the use of this medicine in children.Special care may be needed. Overdosage: If you think you have taken too much of this medicine contact apoison control center or emergency room at once. NOTE: This medicine is only for you. Do not share this medicine with others. What if I miss a dose? It is important not to miss your dose. Call your doctor or health careprofessional if you are unable to keep an appointment. What may interact with this medication? Do not take this medicine with any of the following medications: live virus vaccines This medicine may also interact with the following medications: antiviral medicines for hepatitis, HIV or AIDS certain antibiotics like erythromycin and clarithromycin certain medicines for fungal infections like ketoconazole and itraconazole certain medicines for seizures like carbamazepine, phenobarbital, phenytoin gemfibrozil nefazodone rifampin St. John's wort This list may not describe all possible interactions. Give your health care provider a list of all the medicines, herbs, non-prescription drugs, or dietary supplements you use. Also tell them if you smoke, drink alcohol, or use illegaldrugs. Some items may interact with your medicine. What should I watch for while using this medication? Your condition will be monitored carefully while you are receiving this medicine. You will need important blood  work done while you are taking thismedicine. This medicine can cause serious allergic reactions. To reduce your risk you will need to take other medicine(s) before treatment with this medicine. If you experience allergic reactions like skin rash, itching or hives, swelling of theface, lips, or tongue, tell your doctor or health care professional right away. In some cases, you may be given additional medicines to help with side effects.Follow all directions for their use. This drug may make you feel generally unwell. This is not uncommon, as chemotherapy can affect healthy cells as well as cancer cells. Report any side effects. Continue your course of treatment even though you feel ill unless yourdoctor tells you to stop. Call your doctor or health care professional for advice if you get a fever, chills or sore throat, or other symptoms of a cold or flu. Do not treat yourself. This drug decreases your body's ability to fight infections. Try toavoid being around people who are sick. This medicine may increase your risk to bruise or bleed. Call your doctor orhealth care professional if you notice any unusual bleeding. Be careful brushing and flossing your teeth or using a toothpick because you may get an infection or bleed more easily. If you have any dental work done,tell your dentist you are receiving this medicine. Avoid taking products that contain aspirin, acetaminophen, ibuprofen, naproxen, or ketoprofen unless instructed by your doctor. These medicines may hide afever. Do not become pregnant while taking this medicine.  Women should inform their doctor if they wish to become pregnant or think they might be pregnant. There is a potential for serious side effects to an unborn child. Talk to your health care professional or pharmacist for more information. Do not breast-feed aninfant while taking this medicine. Men are advised not to father a child while receiving this medicine. This product may contain  alcohol. Ask your pharmacist or healthcare provider if this medicine contains alcohol. Be sure to tell all healthcare providers you are taking this medicine. Certain medicines, like metronidazole and disulfiram, can cause an unpleasant reaction when taken with alcohol. The reaction includes flushing, headache, nausea, vomiting, sweating, and increased thirst. Thereaction can last from 30 minutes to several hours. What side effects may I notice from receiving this medication? Side effects that you should report to your doctor or health care professionalas soon as possible: allergic reactions like skin rash, itching or hives, swelling of the face, lips, or tongue breathing problems changes in vision fast, irregular heartbeat high or low blood pressure mouth sores pain, tingling, numbness in the hands or feet signs of decreased platelets or bleeding - bruising, pinpoint red spots on the skin, black, tarry stools, blood in the urine signs of decreased red blood cells - unusually weak or tired, feeling faint or lightheaded, falls signs of infection - fever or chills, cough, sore throat, pain or difficulty passing urine signs and symptoms of liver injury like dark yellow or brown urine; general ill feeling or flu-like symptoms; light-colored stools; loss of appetite; nausea; right upper belly pain; unusually weak or tired; yellowing of the eyes or skin swelling of the ankles, feet, hands unusually slow heartbeat Side effects that usually do not require medical attention (report to yourdoctor or health care professional if they continue or are bothersome): diarrhea hair loss loss of appetite muscle or joint pain nausea, vomiting pain, redness, or irritation at site where injected tiredness This list may not describe all possible side effects. Call your doctor for medical advice about side effects. You may report side effects to FDA at1-800-FDA-1088. Where should I keep my medication? This drug is  given in a hospital or clinic and will not be stored at home. NOTE: This sheet is a summary. It may not cover all possible information. If you have questions about this medicine, talk to your doctor, pharmacist, orhealth care provider.  2022 Elsevier/Gold Standard (2019-05-11 13:37:23)

## 2020-12-07 NOTE — Progress Notes (Signed)
[place]  Telephone:([phone]) [phone] Fax:([phone]) [phone]     ID: Melissa Mcgrath DOB: 1961-01-19  MR#: [id number]  AXE#:[id number]  Patient Care Team: Doree Albee, MD as PCP - General (Internal Medicine) Mauro Kaufmann, RN as Oncology Nurse Navigator Rockwell Germany, RN as Oncology Nurse Navigator Magrinat, Virgie Dad, MD as Consulting Physician (Oncology) Stark Klein, MD as Consulting Physician (General Surgery) Gery Pray, MD as Consulting Physician (Radiation Oncology) Dian Queen, MD as Consulting Physician (Obstetrics and Gynecology) Ulla Gallo, MD as Consulting Physician (Dermatology) Scot Dock, NP OTHER MD:   CHIEF COMPLAINT: Estrogen receptor positive breast cancer  CURRENT TREATMENT: Neoadjuvant chemotherapy   INTERVAL HISTORY: Melissa Mcgrath returns today for follow up and treatment of her estrogen receptor positive breast cancer.  She is accompanied by her mother  She began neoadjuvant chemotherapy, consisting of cyclophosphamide and doxorubicin in dose dense fashion x4, on 09/18/2020. She has completed the first four of those treatments and will now proceed with weekly Paclitaxel.  Today is week 1 of 12.    Melissa Mcgrath is doing well today. She is nervous about what to expect because she did have a difficult time with the first four treatments.  She denies any new issues today. She tells me that she needs to skip two weeks in July instead of just 1 for her daughter's cheer competition.    Melissa Mcgrath also notes that she can still feel a part of her tumor in her right breast, however it is much smaller than prior.   REVIEW OF SYSTEMS: Review of Systems  Constitutional:  Positive for fatigue. Negative for appetite change, chills, fever and unexpected weight change.  HENT:   Negative for hearing loss, lump/mass and trouble swallowing.   Eyes:  Negative for eye problems and icterus.  Respiratory:  Negative for chest tightness, cough and shortness of  breath.   Cardiovascular:  Negative for chest pain, leg swelling and palpitations.  Gastrointestinal:  Negative for abdominal distention, abdominal pain, constipation, diarrhea, nausea and vomiting.  Endocrine: Negative for hot flashes.  Genitourinary:  Negative for difficulty urinating.   Musculoskeletal:  Negative for arthralgias.  Skin:  Negative for itching and rash.  Neurological:  Negative for dizziness, extremity weakness, headaches and numbness.  Hematological:  Negative for adenopathy. Does not bruise/bleed easily.  Psychiatric/Behavioral:  Negative for depression. The patient is not nervous/anxious.       COVID 19 VACCINATION STATUS: [name] x2, most recently 01/2020, no booster as of 08/29/2020   HISTORY OF CURRENT ILLNESS: From the original intake note:  Melissa Mcgrath herself palpated an upper-outer right breast lump and noted associated intermittent pain and itching. She underwent bilateral diagnostic mammography with tomography and right breast ultrasonography at [facility] on 08/20/2020 showing: breast density category B; palpable 2.1 cm irregular mass in right breast at 9 o'clock; three abnormal-appearing right axillary tail nodes; indeterminate 4 mm oval mass located 1.8 cm lateral to dominant mass.  Accordingly on 08/21/2020 she proceeded to biopsy of the right breast area in question. The pathology from this procedure ([id number]) showed: invasive mammary carcinoma, e-cadherin positive, grade 3. Prognostic indicators significant for: estrogen receptor, 90% positive with strong staining intensity and progesterone receptor, 2% positive with weak staining intensity. Proliferation marker Ki67 at 40%. HER2 equivocal by immunohistochemistry (2+), but negative by fluorescent in situ hybridization (signals ratio and number per cell not available today).  The biopsied lymph node in the right axillary tail also showed invasive ductal carcinoma. No lymph node  tissue was identified, possibly  representing an entirely replaced lymph node.  Cancer Staging Malignant neoplasm of upper-outer quadrant of right breast in female, estrogen receptor positive ([place]) Staging form: Breast, AJCC 8th Edition - Clinical stage from 08/29/2020: Stage IIIA (cT2, cN2, cM0, G3, ER+, PR+, HER2-) - Signed by Gardenia Phlegm, NP on 09/07/2020 Stage prefix: Initial diagnosis Stage used in treatment planning: Yes National guidelines used in treatment planning: Yes Type of national guideline used in treatment planning: NCCN  The patient's subsequent history is as detailed below.   PAST MEDICAL HISTORY: Past Medical History:  Diagnosis Date   BV (bacterial vaginosis) 10/27/2014   Depression 01/05/2014   Facial basal cell cancer    Family history of breast cancer    Family history of melanoma    Family history of ovarian cancer    Family history of pancreatic cancer    Menopause 01/05/2014   Vaginal atrophy 11/09/2014   Vaginal itching 10/27/2014    PAST SURGICAL HISTORY: Past Surgical History:  Procedure Laterality Date   BREAST ENHANCEMENT SURGERY     CARPAL TUNNEL RELEASE Right    PORTACATH PLACEMENT N/A 09/17/2020   Procedure: INSERTION PORT-A-CATH;  Surgeon: Stark Klein, MD;  Location: [facility];  Service: General;  Laterality: N/A;   refractive lensectomy Bilateral     FAMILY HISTORY: Family History  Problem Relation Age of Onset   Breast cancer Mother 35       breast   Hypertension Father    Heart attack Father    Alzheimer's disease Father    Fibromyalgia Sister    Diabetes Sister    Heart attack Sister    Hypertension Sister    Hypertension Brother    Diabetes Brother    Pancreatic cancer Maternal Grandmother 83       Pancreatic   Melanoma Paternal Uncle        dx 9s   Melanoma Paternal Uncle        dx 49s   Ovarian cancer Paternal Aunt        dx 50s/60s  Her father died at age 4 from Alzheimer's. Her mother died at age 24 from metastatic breast  cancer. She was initially diagnosed with breast cancer at age 69. Melissa Mcgrath has two brothers and one sister. In addition to her mother, she reports cancer in a paternal aunt (ovarian in mid 11's) and in her maternal grandmother (pancreatic at age 37).    GYNECOLOGIC HISTORY:  No LMP recorded. Patient is postmenopausal. Menarche: 60 years old Age at first live birth: 60 years old [place] P 2 LMP 2010 Contraceptive: used for approx. 20 years HRT used from 11/2019 until diagnosis  Hysterectomy? no BSO? 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 Husband Herbie Baltimore "Fritz Pickerel" is retired from working for [employer]. For exercise, she walks daily, rides horses every other week, and does farm work daily. Daughter Cline Cools, age 37, is a horse trainer/instructor. Daughter Lavell Anchors, age 36, is a Copywriter, advertising in [place]. Anella has one grandchild. She attends [employer].    ADVANCED DIRECTIVES: In the absence of any documentation to the contrary, the patient's spouse is their HCPOA.    HEALTH MAINTENANCE: Social History   Tobacco Use   Smoking status: Never   Smokeless tobacco: Never  Vaping Use   Vaping Use: Never used  Substance Use Topics   Alcohol use: No   Drug use: No     Colonoscopy:  never done  PAP: 01/2020  Bone density: never done   No Known Allergies  Current Outpatient Medications  Medication Sig Dispense Refill   ciprofloxacin (CIPRO) 500 MG tablet Take 1 tablet (500 mg total) by mouth 2 (two) times daily. Start 11/22/2020 in AM 10 tablet 0   dexamethasone (DECADRON) 4 MG tablet Take 2 tablets (8 mg total) by mouth daily. Take daily for 3 days after chemo. Take with food. 30 tablet 1   docusate sodium (COLACE) 100 MG capsule TAKE 2 CAPSULES BY MOUTH TWICE DAILY AS NEEDED FOR CONSTIPATION 60 capsule 2   lidocaine-prilocaine (EMLA) cream Apply 1 application topically as needed. 30 g 1   loratadine (CLARITIN) 10 MG tablet Take 1 tablet (10 mg  total) by mouth daily. 90 tablet 0   LORazepam (ATIVAN) 0.5 MG tablet Take 1 tablet (0.5 mg total) by mouth at bedtime as needed (Nausea or vomiting). 20 tablet 0   magic mouthwash SOLN 5 ml swish and spit QID prn, oral tenderness 240 mL 2   Multiple Vitamin (MULTIVITAMIN) tablet Take 1 tablet by mouth daily.     ondansetron (ZOFRAN) 8 MG tablet Take 1 tablet (8 mg total) by mouth every 8 (eight) hours as needed for nausea or vomiting. Start day 3 after chemotherapy. 20 tablet 0   oxyCODONE (OXY IR/ROXICODONE) 5 MG immediate release tablet Take 0.5-1 tablets (2.5-5 mg total) by mouth every 6 (six) hours as needed for severe pain. 5 tablet 0   polyethylene glycol powder (MIRALAX) 17 GM/SCOOP powder Take 17 g by mouth daily as needed. 255 g 0   prochlorperazine (COMPAZINE) 10 MG tablet Take 0.5 tablets (5 mg total) by mouth every 6 (six) hours as needed (Nausea or vomiting). 30 tablet 1   triamcinolone lotion (KENALOG) 0.1 % Apply 1 application topically 3 (three) times daily. 120 mL 3   valACYclovir (VALTREX) 1000 MG tablet Take 1 tablet (1,000 mg total) by mouth daily. 30 tablet 0   No current facility-administered medications for this visit.    OBJECTIVE: White woman in no acute distress  Vitals:   12/07/20 0853  BP: 112/65  Pulse: 98  Resp: 17  Temp: 97.6 F (36.4 C)  SpO2: 100%     Body mass index is 20.48 kg/m.   Wt Readings from Last 3 Encounters:  12/07/20 134 lb 11.2 oz (61.1 kg)  11/15/20 135 lb 4.8 oz (61.4 kg)  10/25/20 134 lb 8 oz (61 kg)      ECOG FS:1 - Symptomatic but completely ambulatory GENERAL: Patient is a well appearing female in no acute distress HEENT:  Sclerae anicteric.  Oropharynx clear and moist. No ulcerations or evidence of oropharyngeal candidiasis. Neck is supple.  NODES:  No cervical, supraclavicular, or axillary lymphadenopathy palpated.  BREAST EXAM:  right breast mass in UOQ is difficult to palpate.  . LUNGS:  Clear to auscultation bilaterally.   No wheezes or rhonchi. HEART:  Regular rate and rhythm. No murmur appreciated. ABDOMEN:  Soft, nontender.  Positive, normoactive bowel sounds. No organomegaly palpated. MSK:  No focal spinal tenderness to palpation. Full range of motion bilaterally in the upper extremities. EXTREMITIES:  No peripheral edema.   SKIN:  Clear with no obvious rashes or skin changes. No nail dyscrasia. NEURO:  Nonfocal. Well oriented.  Appropriate affect.   LAB RESULTS:  CMP     Component Value Date/Time   NA 141 11/15/2020 1204   K 4.0 11/15/2020 1204   CL 106 11/15/2020 1204  CO2 27 11/15/2020 1204   GLUCOSE 94 11/15/2020 1204   BUN 14 11/15/2020 1204   CREATININE 0.71 11/15/2020 1204   CREATININE 0.87 08/29/2020 0820   CREATININE 0.76 12/20/2013 1207   CALCIUM 9.3 11/15/2020 1204   PROT 6.7 11/15/2020 1204   ALBUMIN 3.8 11/15/2020 1204   AST 23 11/15/2020 1204   AST 12 (L) 08/29/2020 0820   ALT 23 11/15/2020 1204   ALT 12 08/29/2020 0820   ALKPHOS 66 11/15/2020 1204   BILITOT 0.3 11/15/2020 1204   BILITOT 0.6 08/29/2020 0820   GFRNONAA >60 11/15/2020 1204   GFRNONAA >60 08/29/2020 0820    No results found for: TOTALPROTELP, ALBUMINELP, A1GS, A2GS, BETS, BETA2SER, GAMS, MSPIKE, SPEI  Lab Results  Component Value Date   WBC 4.5 12/07/2020   NEUTROABS 2.5 12/07/2020   HGB 10.4 (L) 12/07/2020   HCT 31.8 (L) 12/07/2020   MCV 100.6 (H) 12/07/2020   PLT 381 12/07/2020    No results found for: LABCA2  No components found for: DPOEUM353  No results for input(s): INR in the last 168 hours.  No results found for: LABCA2  No results found for: IRW431  No results found for: VQM086  No results found for: PYP950  No results found for: CA2729  No components found for: HGQUANT  No results found for: CEA1 / No results found for: CEA1   No results found for: AFPTUMOR  No results found for: CHROMOGRNA  No results found for: KPAFRELGTCHN, LAMBDASER, KAPLAMBRATIO (kappa/lambda light  chains)  No results found for: HGBA, HGBA2QUANT, HGBFQUANT, HGBSQUAN (Hemoglobinopathy evaluation)   No results found for: LDH  No results found for: IRON, TIBC, IRONPCTSAT (Iron and TIBC)  No results found for: FERRITIN  Urinalysis No results found for: COLORURINE, APPEARANCEUR, LABSPEC, PHURINE, GLUCOSEU, HGBUR, BILIRUBINUR, KETONESUR, PROTEINUR, UROBILINOGEN, NITRITE, LEUKOCYTESUR   STUDIES: No results found.   ELIGIBLE FOR AVAILABLE RESEARCH PROTOCOL: no  ASSESSMENT: 60 y.o. [place] woman status post right breast upper outer quadrant biopsy 08/21/2020 for a clinical mT2 N1, stage IIA/B invasive ductal carcinoma, grade 3, estrogen receptor positive, progesterone receptor weakly positive, HER-2 not amplified, with an MIB-1 of 40%  (a) breast MRI 09/05/2020 shows a clinical T2 N2, stage IIIA disease  (b) bone scan and chest CT scan 09/14/2020 show no evidence of metastatic disease  (1) genetics testing 09/18/2020 through the   CancerNext-Expanded + RNAinsight gene panel offered by [employer] found no deleterious mutations in AIP, ALK, APC, ATM, AXIN2, BAP1, BARD1, BLM, BMPR1A, BRCA1, BRCA2, BRIP1, CDC73, CDH1, CDK4, CDKN1B, CDKN2A, CHEK2, CTNNA1, DICER1, FANCC, FH, FLCN, GALNT12, KIF1B, LZTR1, MAX, MEN1, MET, MLH1, MSH2, MSH3, MSH6, MUTYH, NBN, NF1, NF2, NTHL1, PALB2, PHOX2B, PMS2, POT1, PRKAR1A, PTCH1, PTEN, RAD51C, RAD51D, RB1, RECQL, RET, SDHA, SDHAF2, SDHB, SDHC, SDHD, SMAD4, SMARCA4, SMARCB1, SMARCE1, STK11, SUFU, TMEM127, TP53, TSC1, TSC2, VHL and XRCC2 (sequencing and deletion/duplication); EGFR, EGLN1, HOXB13, KIT, MITF, PDGFRA, POLD1 and POLE (sequencing only); EPCAM and GREM1 (deletion/duplication only). RNA data is routinely analyzed for use in variant interpretation for all genes.   (2) neoadjuvant chemotherapy consisting of cyclophosphamide and doxorubicin in dose dense fashion x4 starting 09/18/2020, to be followed by weekly paclitaxel x12  (a) echo 09/11/2020  shows an ejection fraction in the 60-65% range  (b) cycles 3 and 4 were each delayed by 1 week given multiple side effects.  (3) definitive surgery to follow: Patient is considering bilateral mastectomies without reconstruction  (4) adjuvant radiation  (5) adjuvant antiestrogens   PLAN: Braylon is  here today for follow up of her breast cancer while receiving neoadjuvant chemotherapy.  She is tolerating treatment well, and is happy to be completed with the more difficult first four chemo treatments.  She will now change to weekly Paclitaxel x 12 weeks.    Baker Janus and I reviewed her labs today which were normal.  WE also reviewed Paclitaxel risks/benefits.  She plans on cryotherapy (icing) her hands and feet in hopes to avoid neuroapathy.  We talked about the cheer competition.  I informed Aneira that we do not have any data that supports skipping 2 weeks of neoadjuvant taxol as compared to 1 week of chemo.  However if she would like to take 2 weeks to go to [place] for her duaghter's cheer, we can add those two missed treatments to the end.    Since [place] tumor is still palpable, we will get an ultrasound to further evaluate size/LN to serve as new baseline for how she does with Taxol.  Erynne will return in 1 week for labs, f/u with Dr. Jana Hakim, and her next Taxol.  She knows to call for any questions that may arise between now and her next appointment.  We are happy to see her sooner if needed.   Total encounter time 20 minutes.* in face to face visit time, lab review, chart review, order entry, and documentation of the encounter.   Wilber Bihari, NP 12/07/20 9:33 AM Medical Oncology and Hematology [facility] [place], [place] [address] Tel. [phone]    Fax. [phone]   *Total Encounter Time as defined by the Centers for Medicare and Medicaid Services includes, in addition to the face-to-face time of a patient visit (documented in the note above)  non-face-to-face time: obtaining and reviewing outside history, ordering and reviewing medications, tests or procedures, care coordination (communications with other health care professionals or caregivers) and documentation in the medical record.

## 2020-12-10 ENCOUNTER — Telehealth: Payer: Self-pay | Admitting: *Deleted

## 2020-12-10 ENCOUNTER — Telehealth: Payer: Self-pay | Admitting: Adult Health

## 2020-12-10 NOTE — Telephone Encounter (Signed)
Scheduled per 6/17 los. Pt will receive an updated appt calendar per next visit appt notes   

## 2020-12-12 NOTE — Progress Notes (Addendum)
[place]  [phone] [phone]     ID: Melissa Mcgrath DOB: 1960/09/16  MR#: [id number]  TSV#:[id number]  Patient Care Team: Doree Albee, MD as PCP - General (Internal Medicine) Mauro Kaufmann, RN as Oncology Nurse Navigator Rockwell Germany, RN as Oncology Nurse Navigator Bernadine Melecio, Virgie Dad, MD as Consulting Physician (Oncology) Stark Klein, MD as Consulting Physician (General Surgery) Gery Pray, MD as Consulting Physician (Radiation Oncology) Dian Queen, MD as Consulting Physician (Obstetrics and Gynecology) Ulla Gallo, MD as Consulting Physician (Dermatology) Chauncey Cruel, MD OTHER MD:   CHIEF COMPLAINT: Estrogen receptor positive breast cancer  CURRENT TREATMENT: Neoadjuvant chemotherapy   INTERVAL HISTORY: Melissa Mcgrath returns today for follow up and treatment of her estrogen receptor positive breast cancer.  She is accompanied by her mother  She completed her 4 cycles of cyclophosphamide and doxorubicin 11/15/2020.  We gave her an extra week to recover since those treatments were really rough on her.  She is currently receiving weekly Paclitaxel.  Today is week 2 of 12 doses planned.     REVIEW OF SYSTEMS: Sumiye did very well with her first cycle of paclitaxel and she is very excited that she did not have the horrible nausea problems she had before.  She did feel quite revved up by the time she went home and did not get to bed until 4:30 AM that night.  She also has started eating "everything".  She is gained a little weight and is up to 135 which is the weight she likes.  She has had no mouth sores, no problems with the port, and a detailed review of systems today was otherwise stable.   COVID 19 VACCINATION STATUS: [place] x2, most recently 01/2020, no booster as of 08/29/2020   HISTORY OF CURRENT ILLNESS: From the original intake note:  Melissa Mcgrath herself palpated an upper-outer right breast lump and  noted associated intermittent pain and itching. She underwent bilateral diagnostic mammography with tomography and right breast ultrasonography at [facility] on 08/20/2020 showing: breast density category B; palpable 2.1 cm irregular mass in right breast at 9 o'clock; three abnormal-appearing right axillary tail nodes; indeterminate 4 mm oval mass located 1.8 cm lateral to dominant mass.  Accordingly on 08/21/2020 she proceeded to biopsy of the right breast area in question. The pathology from this procedure ([id number]) showed: invasive mammary carcinoma, e-cadherin positive, grade 3. Prognostic indicators significant for: estrogen receptor, 90% positive with strong staining intensity and progesterone receptor, 2% positive with weak staining intensity. Proliferation marker Ki67 at 40%. HER2 equivocal by immunohistochemistry (2+), but negative by fluorescent in situ hybridization (signals ratio and number per cell not available today).  The biopsied lymph node in the right axillary tail also showed invasive ductal carcinoma. No lymph node tissue was identified, possibly representing an entirely replaced lymph node.  Cancer Staging Malignant neoplasm of upper-outer quadrant of right breast in female, estrogen receptor positive ([place]) Staging form: Breast, AJCC 8th Edition - Clinical stage from 08/29/2020: Stage IIIA (cT2, cN2, cM0, G3, ER+, PR+, HER2-) - Signed by Gardenia Phlegm, NP on 09/07/2020 Stage prefix: Initial diagnosis Stage used in treatment planning: Yes National guidelines used in treatment planning: Yes Type of national guideline used in treatment planning: NCCN  The patient's subsequent history is as detailed below.   PAST MEDICAL HISTORY: Past Medical History:  Diagnosis Date   BV (bacterial vaginosis) 10/27/2014   Depression 01/05/2014   Facial basal cell cancer  Family history of breast cancer    Family history of melanoma    Family history of ovarian cancer    Family history  of pancreatic cancer    Menopause 01/05/2014   Vaginal atrophy 11/09/2014   Vaginal itching 10/27/2014    PAST SURGICAL HISTORY: Past Surgical History:  Procedure Laterality Date   BREAST ENHANCEMENT SURGERY     CARPAL TUNNEL RELEASE Right    PORTACATH PLACEMENT N/A 09/17/2020   Procedure: INSERTION PORT-A-CATH;  Surgeon: Stark Klein, MD;  Location: [facility];  Service: General;  Laterality: N/A;   refractive lensectomy Bilateral     FAMILY HISTORY: Family History  Problem Relation Age of Onset   Breast cancer Mother 62       breast   Hypertension Father    Heart attack Father    Alzheimer's disease Father    Fibromyalgia Sister    Diabetes Sister    Heart attack Sister    Hypertension Sister    Hypertension Brother    Diabetes Brother    Pancreatic cancer Maternal Grandmother 83       Pancreatic   Melanoma Paternal Uncle        dx 31s   Melanoma Paternal Uncle        dx 12s   Ovarian cancer Paternal Aunt        dx 50s/60s  Her father died at age 71 from Alzheimer's. Her mother died at age 58 from metastatic breast cancer. She was initially diagnosed with breast cancer at age 24. Melissa Mcgrath has two brothers and one sister. In addition to her mother, she reports cancer in a paternal aunt (ovarian in mid 33's) and in her maternal grandmother (pancreatic at age 32).    GYNECOLOGIC HISTORY:  No LMP recorded. Patient is postmenopausal. Menarche: 60 years old Age at first live birth: 60 years old [place] P 2 LMP 2010 Contraceptive: used for approx. 20 years HRT used from 11/2019 until diagnosis  Hysterectomy? no BSO? 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 Husband Herbie [place] "Fritz Pickerel" is retired from working for [employer]. For exercise, she walks daily, rides horses every other week, and does farm work daily. Daughter Melissa Mcgrath, age 14, is a horse trainer/instructor. Daughter Melissa Mcgrath, age 66, is a Copywriter, advertising in [place]. Don has one  grandchild. She attends [employer].    ADVANCED DIRECTIVES: In the absence of any documentation to the contrary, the patient's spouse is their HCPOA.    HEALTH MAINTENANCE: Social History   Tobacco Use   Smoking status: Never   Smokeless tobacco: Never  Vaping Use   Vaping Use: Never used  Substance Use Topics   Alcohol use: No   Drug use: No     Colonoscopy: never done  PAP: 01/2020  Bone density: never done   No Known Allergies  Current Outpatient Medications  Medication Sig Dispense Refill   docusate sodium (COLACE) 100 MG capsule TAKE 2 CAPSULES BY MOUTH TWICE DAILY AS NEEDED FOR CONSTIPATION 60 capsule 2   lidocaine-prilocaine (EMLA) cream Apply 1 application topically as needed. 30 g 1   loratadine (CLARITIN) 10 MG tablet Take 1 tablet (10 mg total) by mouth daily. 90 tablet 0   LORazepam (ATIVAN) 0.5 MG tablet Take 1 tablet (0.5 mg total) by mouth at bedtime as needed (Nausea or vomiting). 20 tablet 0   magic mouthwash SOLN 5 ml swish and spit QID prn, oral tenderness 240 mL 2  Multiple Vitamin (MULTIVITAMIN) tablet Take 1 tablet by mouth daily.     ondansetron (ZOFRAN) 8 MG tablet Take 1 tablet (8 mg total) by mouth every 8 (eight) hours as needed for nausea or vomiting. Start day 3 after chemotherapy. 20 tablet 0   polyethylene glycol powder (MIRALAX) 17 GM/SCOOP powder Take 17 g by mouth daily as needed. 255 g 0   prochlorperazine (COMPAZINE) 10 MG tablet Take 0.5 tablets (5 mg total) by mouth every 6 (six) hours as needed (Nausea or vomiting). 30 tablet 1   triamcinolone lotion (KENALOG) 0.1 % Apply 1 application topically 3 (three) times daily. 120 mL 3   valACYclovir (VALTREX) 1000 MG tablet Take 1 tablet (1,000 mg total) by mouth daily. 30 tablet 0   No current facility-administered medications for this visit.    OBJECTIVE: White woman in no acute distress  Vitals:   12/13/20 0850  BP: (!) 123/48  Pulse: 86  Resp: 16  Temp: 97.9 F (36.6 C)   SpO2: 100%     Body mass index is 20.53 kg/m.   Wt Readings from Last 3 Encounters:  12/13/20 135 lb (61.2 kg)  12/07/20 134 lb 11.2 oz (61.1 kg)  11/15/20 135 lb 4.8 oz (61.4 kg)     ECOG FS:1 - Symptomatic but completely ambulatory  Sclerae unicteric, EOMs intact Wearing a mask No cervical or supraclavicular adenopathy Lungs no rales or rhonchi Heart regular rate and rhythm Abd soft, nontender, positive bowel sounds MSK no focal spinal tenderness, no upper extremity lymphedema Neuro: nonfocal, well oriented, appropriate affect Breasts: I do not palpate a mass in either breast.  Both axillae are benign.   LAB RESULTS:  CMP     Component Value Date/Time   NA 141 12/07/2020 0832   K 3.8 12/07/2020 0832   CL 109 12/07/2020 0832   CO2 26 12/07/2020 0832   GLUCOSE 93 12/07/2020 0832   BUN 11 12/07/2020 0832   CREATININE 0.79 12/07/2020 0832   CREATININE 0.76 12/20/2013 1207   CALCIUM 9.3 12/07/2020 0832   PROT 6.7 12/07/2020 0832   ALBUMIN 3.8 12/07/2020 0832   AST 21 12/07/2020 0832   ALT 19 12/07/2020 0832   ALKPHOS 62 12/07/2020 0832   BILITOT 0.5 12/07/2020 0832   GFRNONAA >60 12/07/2020 0832    No results found for: TOTALPROTELP, ALBUMINELP, A1GS, A2GS, BETS, BETA2SER, GAMS, MSPIKE, SPEI  Lab Results  Component Value Date   WBC 3.5 (L) 12/13/2020   NEUTROABS 2.4 12/13/2020   HGB 9.9 (L) 12/13/2020   HCT 29.5 (L) 12/13/2020   MCV 99.7 12/13/2020   PLT 264 12/13/2020    No results found for: LABCA2  No components found for: [id number]  No results for input(s): INR in the last 168 hours.  No results found for: LABCA2  No results found for: NWG956  No results found for: OZH086  No results found for: VHQ469  No results found for: CA2729  No components found for: HGQUANT  No results found for: CEA1 / No results found for: CEA1   No results found for: AFPTUMOR  No results found for: CHROMOGRNA  No results found for: KPAFRELGTCHN, LAMBDASER,  KAPLAMBRATIO (kappa/lambda light chains)  No results found for: HGBA, HGBA2QUANT, HGBFQUANT, HGBSQUAN (Hemoglobinopathy evaluation)   No results found for: LDH  No results found for: IRON, TIBC, IRONPCTSAT (Iron and TIBC)  No results found for: FERRITIN  Urinalysis No results found for: COLORURINE, APPEARANCEUR, LABSPEC, PHURINE, GLUCOSEU, HGBUR, BILIRUBINUR, KETONESUR, [place], [place], NITRITE, LEUKOCYTESUR  STUDIES: No results found.   ELIGIBLE FOR AVAILABLE RESEARCH PROTOCOL: no  ASSESSMENT: 60 y.o. [place] woman status post right breast upper outer quadrant biopsy 08/21/2020 for a clinical mT2 N1, stage IIA/B invasive ductal carcinoma, grade 3, estrogen receptor positive, progesterone receptor weakly positive, HER-2 not amplified, with an MIB-1 of 40%  (a) breast MRI 09/05/2020 shows a clinical T2 N2, stage IIIA disease  (b) bone scan and chest CT scan 09/14/2020 show no evidence of metastatic disease  (1) genetics testing 09/18/2020 through the  [facility]-Expanded + RNAinsight panel found no deleterious mutations in AIP, ALK, APC, ATM, AXIN2, BAP1, BARD1, BLM, BMPR1A, BRCA1, BRCA2, BRIP1, CDC73, CDH1, CDK4, CDKN1B, CDKN2A, CHEK2, CTNNA1, DICER1, FANCC, FH, FLCN, GALNT12, KIF1B, LZTR1, MAX, MEN1, MET, MLH1, MSH2, MSH3, MSH6, MUTYH, NBN, NF1, NF2, NTHL1, PALB2, PHOX2B, PMS2, POT1, PRKAR1A, PTCH1, PTEN, RAD51C, RAD51D, RB1, RECQL, RET, SDHA, SDHAF2, SDHB, SDHC, SDHD, SMAD4, SMARCA4, SMARCB1, SMARCE1, STK11, SUFU, TMEM127, TP53, TSC1, TSC2, VHL and XRCC2 (sequencing and deletion/duplication); EGFR, EGLN1, HOXB13, KIT, MITF, PDGFRA, POLD1 and POLE (sequencing only); EPCAM and GREM1 (deletion/duplication only). RNA data is routinely analyzed for use in variant interpretation for all genes.  (2) neoadjuvant chemotherapy consisting of cyclophosphamide and doxorubicin in dose dense fashion x4 started 09/18/2020, completed 11/15/2020, followed by weekly paclitaxel x12  started 12/07/2020  (a) echo 09/11/2020 shows an ejection fraction in the 60-65% range  (b) CA cycles 3 and 4 were each delayed by 1 week given multiple side effects  (c) start of weekly paclitaxel also delayed 1 week for the same reason  (3) definitive surgery to follow: Patient is considering bilateral mastectomies without reconstruction  (4) adjuvant radiation  (5) adjuvant antiestrogens   PLAN: Betty is doing much better with the current treatment and she is delighted at her tolerance.  She has a lot of energy and she likely will pick up a little bit of weight.  She wants to mow the grass and she is got a family trip planned for the week of July 7 through the 14th  Accordingly she will be treated today and June 30 but then her next treatment will be July 21.  I am trying to get her scheduled for weekly treatments after that so that we do not get delays because of our nurse shortage.  She will be seen on July 21 and then every second cycle thereafter  She knows to call for any other problem that may develop before then  Total encounter time 25 minutes.  Virgie Dad. Josslyn Ciolek, MD 12/13/20 8:59 AM Medical Oncology and Hematology [facility] [place], [place] [address] Tel. [phone]    Fax. [phone]   I, [name], am acting as scribe for Dr. Virgie Dad. Sukhman Kocher. I, Lurline Del MD, have reviewed the above documentation for accuracy and completeness, and I agree with the above.    *Total Encounter Time as defined by the Centers for Medicare and Medicaid Services includes, in addition to the face-to-face time of a patient visit (documented in the note above) non-face-to-face time: obtaining and reviewing outside history, ordering and reviewing medications, tests or procedures, care coordination (communications with other health care professionals or caregivers) and documentation in the medical record.

## 2020-12-13 ENCOUNTER — Inpatient Hospital Stay: Payer: No Typology Code available for payment source

## 2020-12-13 ENCOUNTER — Encounter: Payer: Self-pay | Admitting: *Deleted

## 2020-12-13 ENCOUNTER — Other Ambulatory Visit: Payer: Self-pay

## 2020-12-13 ENCOUNTER — Inpatient Hospital Stay (HOSPITAL_BASED_OUTPATIENT_CLINIC_OR_DEPARTMENT_OTHER): Payer: No Typology Code available for payment source | Admitting: Oncology

## 2020-12-13 VITALS — BP 123/48 | HR 86 | Temp 97.9°F | Resp 16 | Ht 68.0 in | Wt 135.0 lb

## 2020-12-13 DIAGNOSIS — Z5111 Encounter for antineoplastic chemotherapy: Secondary | ICD-10-CM | POA: Diagnosis not present

## 2020-12-13 DIAGNOSIS — C50411 Malignant neoplasm of upper-outer quadrant of right female breast: Secondary | ICD-10-CM | POA: Diagnosis not present

## 2020-12-13 DIAGNOSIS — Z170 Estrogen receptor positive status [ER+]: Secondary | ICD-10-CM

## 2020-12-13 DIAGNOSIS — Z95828 Presence of other vascular implants and grafts: Secondary | ICD-10-CM

## 2020-12-13 LAB — CBC WITH DIFFERENTIAL/PLATELET
Abs Immature Granulocytes: 0.02 10*3/uL (ref 0.00–0.07)
Basophils Absolute: 0.1 10*3/uL (ref 0.0–0.1)
Basophils Relative: 3 %
Eosinophils Absolute: 0 10*3/uL (ref 0.0–0.5)
Eosinophils Relative: 0 %
HCT: 29.5 % — ABNORMAL LOW (ref 36.0–46.0)
Hemoglobin: 9.9 g/dL — ABNORMAL LOW (ref 12.0–15.0)
Immature Granulocytes: 1 %
Lymphocytes Relative: 17 %
Lymphs Abs: 0.6 10*3/uL — ABNORMAL LOW (ref 0.7–4.0)
MCH: 33.4 pg (ref 26.0–34.0)
MCHC: 33.6 g/dL (ref 30.0–36.0)
MCV: 99.7 fL (ref 80.0–100.0)
Monocytes Absolute: 0.4 10*3/uL (ref 0.1–1.0)
Monocytes Relative: 12 %
Neutro Abs: 2.4 10*3/uL (ref 1.7–7.7)
Neutrophils Relative %: 67 %
Platelets: 264 10*3/uL (ref 150–400)
RBC: 2.96 MIL/uL — ABNORMAL LOW (ref 3.87–5.11)
RDW: 14.6 % (ref 11.5–15.5)
WBC: 3.5 10*3/uL — ABNORMAL LOW (ref 4.0–10.5)
nRBC: 0 % (ref 0.0–0.2)

## 2020-12-13 LAB — COMPREHENSIVE METABOLIC PANEL
ALT: 20 U/L (ref 0–44)
AST: 20 U/L (ref 15–41)
Albumin: 3.5 g/dL (ref 3.5–5.0)
Alkaline Phosphatase: 59 U/L (ref 38–126)
Anion gap: 9 (ref 5–15)
BUN: 12 mg/dL (ref 6–20)
CO2: 23 mmol/L (ref 22–32)
Calcium: 9.1 mg/dL (ref 8.9–10.3)
Chloride: 111 mmol/L (ref 98–111)
Creatinine, Ser: 0.72 mg/dL (ref 0.44–1.00)
GFR, Estimated: >60 mL/min (ref >60)
Glucose, Bld: 90 mg/dL (ref 70–99)
Potassium: 3.8 mmol/L (ref 3.5–5.1)
Sodium: 143 mmol/L (ref 135–145)
Total Bilirubin: 0.6 mg/dL (ref 0.3–1.2)
Total Protein: 6.4 g/dL — ABNORMAL LOW (ref 6.5–8.1)

## 2020-12-13 MED ORDER — DEXAMETHASONE SODIUM PHOSPHATE 100 MG/10ML IJ SOLN
10.0000 mg | Freq: Once | INTRAMUSCULAR | Status: AC
  Administered 2020-12-13: 10 mg via INTRAVENOUS
  Filled 2020-12-13: qty 10

## 2020-12-13 MED ORDER — DIPHENHYDRAMINE HCL 50 MG/ML IJ SOLN
INTRAMUSCULAR | Status: AC
  Filled 2020-12-13: qty 1

## 2020-12-13 MED ORDER — FAMOTIDINE 20 MG IN NS 100 ML IVPB
20.0000 mg | Freq: Once | INTRAVENOUS | Status: AC
  Administered 2020-12-13: 20 mg via INTRAVENOUS

## 2020-12-13 MED ORDER — SODIUM CHLORIDE 0.9% FLUSH
10.0000 mL | INTRAVENOUS | Status: DC | PRN
  Administered 2020-12-13: 10 mL
  Filled 2020-12-13: qty 10

## 2020-12-13 MED ORDER — SODIUM CHLORIDE 0.9 % IV SOLN
80.0000 mg/m2 | Freq: Once | INTRAVENOUS | Status: AC
  Administered 2020-12-13: 138 mg via INTRAVENOUS
  Filled 2020-12-13: qty 23

## 2020-12-13 MED ORDER — DIPHENHYDRAMINE HCL 50 MG/ML IJ SOLN
25.0000 mg | Freq: Once | INTRAMUSCULAR | Status: AC
  Administered 2020-12-13: 25 mg via INTRAVENOUS

## 2020-12-13 MED ORDER — SODIUM CHLORIDE 0.9 % IV SOLN
INTRAVENOUS | Status: DC
  Filled 2020-12-13: qty 250

## 2020-12-13 MED ORDER — FAMOTIDINE 20 MG IN NS 100 ML IVPB
INTRAVENOUS | Status: AC
  Filled 2020-12-13: qty 100

## 2020-12-13 MED ORDER — SODIUM CHLORIDE 0.9% FLUSH
10.0000 mL | Freq: Once | INTRAVENOUS | Status: AC
  Administered 2020-12-13: 10 mL
  Filled 2020-12-13: qty 10

## 2020-12-13 MED ORDER — HEPARIN SOD (PORK) LOCK FLUSH 100 UNIT/ML IV SOLN
500.0000 [IU] | Freq: Once | INTRAVENOUS | Status: AC | PRN
  Administered 2020-12-13: 500 [IU]
  Filled 2020-12-13: qty 5

## 2020-12-13 MED ORDER — SODIUM CHLORIDE 0.9 % IV SOLN
Freq: Once | INTRAVENOUS | Status: AC
  Filled 2020-12-13: qty 250

## 2020-12-13 NOTE — Patient Instructions (Signed)
[facility] ONCOLOGY  Discharge Instructions: Thank you for choosing [facility] to provide your oncology and hematology care.   If you have a lab appointment with the [facility], please go directly to the [facility] and check in at the registration area.   Wear comfortable clothing and clothing appropriate for easy access to any Portacath or PICC line.   We strive to give you quality time with your provider. You may need to reschedule your appointment if you arrive late (15 or more minutes).  Arriving late affects you and other patients whose appointments are after yours.  Also, if you miss three or more appointments without notifying the office, you may be dismissed from the clinic at the provider's discretion.      For prescription refill requests, have your pharmacy contact our office and allow 72 hours for refills to be completed.    Today you received the following chemotherapy and/or immunotherapy agents :Taxol     To help prevent nausea and vomiting after your treatment, we encourage you to take your nausea medication as directed.  BELOW ARE SYMPTOMS THAT SHOULD BE REPORTED IMMEDIATELY: *FEVER GREATER THAN 100.4 F (38 C) OR HIGHER *CHILLS OR SWEATING *NAUSEA AND VOMITING THAT IS NOT CONTROLLED WITH YOUR NAUSEA MEDICATION *UNUSUAL SHORTNESS OF BREATH *UNUSUAL BRUISING OR BLEEDING *URINARY PROBLEMS (pain or burning when urinating, or frequent urination) *BOWEL PROBLEMS (unusual diarrhea, constipation, pain near the anus) TENDERNESS IN MOUTH AND THROAT WITH OR WITHOUT PRESENCE OF ULCERS (sore throat, sores in mouth, or a toothache) UNUSUAL RASH, SWELLING OR PAIN  UNUSUAL VAGINAL DISCHARGE OR ITCHING   Items with * indicate a potential emergency and should be followed up as soon as possible or go to the Emergency Department if any problems should occur.  Please show the CHEMOTHERAPY ALERT CARD or IMMUNOTHERAPY ALERT CARD at check-in to the  Emergency Department and triage nurse.  Should you have questions after your visit or need to cancel or reschedule your appointment, please contact [facility]  Dept: [phone]  and follow the prompts.  Office hours are 8:00 a.m. to 4:30 p.m. Monday - Friday. Please note that voicemails left after 4:00 p.m. may not be returned until the following business day.  We are closed weekends and major holidays. You have access to a nurse at all times for urgent questions. Please call the main number to the clinic Dept: [phone] and follow the prompts.   For any non-urgent questions, you may also contact your provider using MyChart. We now offer e-Visits for anyone 27 and older to request care online for non-urgent symptoms. For details visit [email or web address].   Also download the MyChart app! Go to the app store, search "MyChart", open the app, select [facility], and log in with your MyChart username and password.  Due to Covid, a mask is required upon entering the hospital/clinic. If you do not have a mask, one will be given to you upon arrival. For doctor visits, patients may have 1 support person aged 1 or older with them. For treatment visits, patients cannot have anyone with them due to current Covid guidelines and our immunocompromised population.

## 2020-12-19 ENCOUNTER — Other Ambulatory Visit: Payer: Self-pay | Admitting: Oncology

## 2020-12-20 ENCOUNTER — Inpatient Hospital Stay: Payer: No Typology Code available for payment source

## 2020-12-20 ENCOUNTER — Other Ambulatory Visit: Payer: Self-pay

## 2020-12-20 VITALS — BP 119/67 | HR 93 | Temp 98.0°F | Resp 16 | Wt 136.8 lb

## 2020-12-20 DIAGNOSIS — C50411 Malignant neoplasm of upper-outer quadrant of right female breast: Secondary | ICD-10-CM

## 2020-12-20 DIAGNOSIS — Z170 Estrogen receptor positive status [ER+]: Secondary | ICD-10-CM

## 2020-12-20 DIAGNOSIS — Z5111 Encounter for antineoplastic chemotherapy: Secondary | ICD-10-CM | POA: Diagnosis not present

## 2020-12-20 LAB — COMPREHENSIVE METABOLIC PANEL
ALT: 15 U/L (ref 0–44)
AST: 15 U/L (ref 15–41)
Albumin: 3.5 g/dL (ref 3.5–5.0)
Alkaline Phosphatase: 62 U/L (ref 38–126)
Anion gap: 10 (ref 5–15)
BUN: 12 mg/dL (ref 6–20)
CO2: 22 mmol/L (ref 22–32)
Calcium: 8.8 mg/dL — ABNORMAL LOW (ref 8.9–10.3)
Chloride: 110 mmol/L (ref 98–111)
Creatinine, Ser: 0.75 mg/dL (ref 0.44–1.00)
GFR, Estimated: >60 mL/min (ref >60)
Glucose, Bld: 122 mg/dL — ABNORMAL HIGH (ref 70–99)
Potassium: 3.7 mmol/L (ref 3.5–5.1)
Sodium: 142 mmol/L (ref 135–145)
Total Bilirubin: 0.4 mg/dL (ref 0.3–1.2)
Total Protein: 6.4 g/dL — ABNORMAL LOW (ref 6.5–8.1)

## 2020-12-20 LAB — CBC WITH DIFFERENTIAL/PLATELET
Abs Immature Granulocytes: 0.02 10*3/uL (ref 0.00–0.07)
Basophils Absolute: 0.1 10*3/uL (ref 0.0–0.1)
Basophils Relative: 2 %
Eosinophils Absolute: 0 10*3/uL (ref 0.0–0.5)
Eosinophils Relative: 1 %
HCT: 27.8 % — ABNORMAL LOW (ref 36.0–46.0)
Hemoglobin: 9.4 g/dL — ABNORMAL LOW (ref 12.0–15.0)
Immature Granulocytes: 1 %
Lymphocytes Relative: 20 %
Lymphs Abs: 0.6 10*3/uL — ABNORMAL LOW (ref 0.7–4.0)
MCH: 34.3 pg — ABNORMAL HIGH (ref 26.0–34.0)
MCHC: 33.8 g/dL (ref 30.0–36.0)
MCV: 101.5 fL — ABNORMAL HIGH (ref 80.0–100.0)
Monocytes Absolute: 0.3 10*3/uL (ref 0.1–1.0)
Monocytes Relative: 12 %
Neutro Abs: 1.8 10*3/uL (ref 1.7–7.7)
Neutrophils Relative %: 64 %
Platelets: 220 10*3/uL (ref 150–400)
RBC: 2.74 MIL/uL — ABNORMAL LOW (ref 3.87–5.11)
RDW: 14.1 % (ref 11.5–15.5)
WBC: 2.8 10*3/uL — ABNORMAL LOW (ref 4.0–10.5)
nRBC: 0 % (ref 0.0–0.2)

## 2020-12-20 MED ORDER — SODIUM CHLORIDE 0.9% FLUSH
10.0000 mL | INTRAVENOUS | Status: DC | PRN
  Administered 2020-12-20: 10 mL
  Filled 2020-12-20: qty 10

## 2020-12-20 MED ORDER — FAMOTIDINE 20 MG IN NS 100 ML IVPB
20.0000 mg | Freq: Once | INTRAVENOUS | Status: AC
  Administered 2020-12-20: 20 mg via INTRAVENOUS

## 2020-12-20 MED ORDER — SODIUM CHLORIDE 0.9 % IV SOLN
Freq: Once | INTRAVENOUS | Status: AC
  Filled 2020-12-20: qty 250

## 2020-12-20 MED ORDER — HEPARIN SOD (PORK) LOCK FLUSH 100 UNIT/ML IV SOLN
500.0000 [IU] | Freq: Once | INTRAVENOUS | Status: AC | PRN
Start: 2020-12-20 — End: 2020-12-20
  Administered 2020-12-20: 500 [IU]
  Filled 2020-12-20: qty 5

## 2020-12-20 MED ORDER — DIPHENHYDRAMINE HCL 50 MG/ML IJ SOLN
INTRAMUSCULAR | Status: AC
  Filled 2020-12-20: qty 1

## 2020-12-20 MED ORDER — FAMOTIDINE 20 MG IN NS 100 ML IVPB
INTRAVENOUS | Status: AC
  Filled 2020-12-20: qty 100

## 2020-12-20 MED ORDER — DEXAMETHASONE SODIUM PHOSPHATE 10 MG/ML IJ SOLN
INTRAMUSCULAR | Status: AC
  Filled 2020-12-20: qty 1

## 2020-12-20 MED ORDER — SODIUM CHLORIDE 0.9 % IV SOLN
80.0000 mg/m2 | Freq: Once | INTRAVENOUS | Status: AC
  Administered 2020-12-20: 138 mg via INTRAVENOUS
  Filled 2020-12-20: qty 23

## 2020-12-20 MED ORDER — DIPHENHYDRAMINE HCL 50 MG/ML IJ SOLN
25.0000 mg | Freq: Once | INTRAMUSCULAR | Status: AC
  Administered 2020-12-20: 25 mg via INTRAVENOUS

## 2020-12-20 MED ORDER — SODIUM CHLORIDE 0.9 % IV SOLN
4.0000 mg | Freq: Once | INTRAVENOUS | Status: DC
  Filled 2020-12-20: qty 0.4

## 2020-12-20 MED ORDER — DEXAMETHASONE SODIUM PHOSPHATE 10 MG/ML IJ SOLN
4.0000 mg | Freq: Once | INTRAMUSCULAR | Status: AC
  Administered 2020-12-20: 4 mg via INTRAVENOUS

## 2020-12-20 NOTE — Patient Instructions (Signed)
[facility] ONCOLOG[facility]  Discharge Instructions: Thank you for choosing [facility] to provide your oncology and hematology care.   If you have a lab appointment with the [facility], please go directly to the [facility] and check in at the registration area.   Wear comfortable clothing and clothing appropriate for easy access to any Portacath or PICC line.   We strive to give you quality time with your provider. You may need to reschedule your appointment if you arrive late (15 or more minutes).  Arriving late affects you and other patients whose appointments are after yours.  Also, if you miss three or more appointments without notifying the office, you may be dismissed from the clinic at the provider's discretion.      For prescription refill requests, have your pharmacy contact our office and allow 72 hours for refills to be completed.    Today you received the following chemotherapy and/or immunotherapy agents :Taxol     To help prevent nausea and vomiting after your treatment, we encourage you to take your nausea medication as directed.  BELOW ARE SYMPTOMS THAT SHOULD BE REPORTED IMMEDIATELY: *FEVER GREATER THAN 100.4 F (38 C) OR HIGHER *CHILLS OR SWEATING *NAUSEA AND VOMITING THAT IS NOT CONTROLLED WITH YOUR NAUSEA MEDICATION *UNUSUAL SHORTNESS OF BREATH *UNUSUAL BRUISING OR BLEEDING *URINARY PROBLEMS (pain or burning when urinating, or frequent urination) *BOWEL PROBLEMS (unusual diarrhea, constipation, pain near the anus) TENDERNESS IN MOUTH AND THROAT WITH OR WITHOUT PRESENCE OF ULCERS (sore throat, sores in mouth, or a toothache) UNUSUAL RASH, SWELLING OR PAIN  UNUSUAL VAGINAL DISCHARGE OR ITCHING   Items with * indicate a potential emergency and should be followed up as soon as possible or go to the Emergency Department if any problems should occur.  Please show the CHEMOTHERAPY ALERT CARD or IMMUNOTHERAPY ALERT CARD at check-in to the  Emergency Department and triage nurse.  Should you have questions after your visit or need to cancel or reschedule your appointment, please contact [facility]  Dept: [phone]  and follow the prompts.  Office hours are 8:00 a.m. to 4:30 p.m. Monday - Friday. Please note that voicemails left after 4:00 p.m. may not be returned until the following business day.  We are closed weekends and major holidays. You have access to a nurse at all times for urgent questions. Please call the main number to the clinic Dept: [phone] and follow the prompts.   For any non-urgent questions, you may also contact your provider using MyChart. We now offer e-Visits for anyone 59 and older to request care online for non-urgent symptoms. For details visit [email or web address].   Also download the MyChart app! Go to the app store, search "MyChart", open the app, select [facility], and log in with your MyChart username and password.  Due to Covid, a mask is required upon entering the hospital/clinic. If you do not have a mask, one will be given to you upon arrival. For doctor visits, patients may have 1 support person aged 15 or older with them. For treatment visits, patients cannot have anyone with them due to current Covid guidelines and our immunocompromised population.

## 2020-12-20 NOTE — Patient Instructions (Signed)
Implanted Port Home Guide An implanted port is a device that is placed under the skin. It is usually placed in the chest. The device can be used to give IV medicine, to take blood, or for dialysis. You may have an implanted port if: You need IV medicine that would be irritating to the small veins in your hands or arms. You need IV medicines, such as antibiotics, for a long period of time. You need IV nutrition for a long period of time. You need dialysis. When you have a port, your health care provider can choose to use the port instead of veins in your arms for these procedures. You may have fewer limitations when using a port than you would if you used other types of long-term IVs, and you will likely be able to return to normal activities afteryour incision heals. An implanted port has two main parts: Reservoir. The reservoir is the part where a needle is inserted to give medicines or draw blood. The reservoir is round. After it is placed, it appears as a small, raised area under your skin. Catheter. The catheter is a thin, flexible tube that connects the reservoir to a vein. Medicine that is inserted into the reservoir goes into the catheter and then into the vein. How is my port accessed? To access your port: A numbing cream may be placed on the skin over the port site. Your health care provider will put on a mask and sterile gloves. The skin over your port will be cleaned carefully with a germ-killing soap and allowed to dry. Your health care provider will gently pinch the port and insert a needle into it. Your health care provider will check for a blood return to make sure the port is in the vein and is not clogged. If your port needs to remain accessed to get medicine continuously (constant infusion), your health care provider will place a clear bandage (dressing) over the needle site. The dressing and needle will need to be changed every week, or as told by your health care provider. What  is flushing? Flushing helps keep the port from getting clogged. Follow instructions from your health care provider about how and when to flush the port. Ports are usually flushed with saline solution or a medicine called heparin. The need for flushing will depend on how the port is used: If the port is only used from time to time to give medicines or draw blood, the port may need to be flushed: Before and after medicines have been given. Before and after blood has been drawn. As part of routine maintenance. Flushing may be recommended every 4-6 weeks. If a constant infusion is running, the port may not need to be flushed. Throw away any syringes in a disposal container that is meant for sharp items (sharps container). You can buy a sharps container from a pharmacy, or you can make one by using an empty hard plastic bottle with a cover. How long will my port stay implanted? The port can stay in for as long as your health care provider thinks it is needed. When it is time for the port to come out, a surgery will be done to remove it. The surgery will be similar to the procedure that was done to putthe port in. Follow these instructions at home:  Flush your port as told by your health care provider. If you need an infusion over several days, follow instructions from your health care provider about how to take   care of your port site. Make sure you: Wash your hands with soap and water before you change your dressing. If soap and water are not available, use alcohol-based hand sanitizer. Change your dressing as told by your health care provider. Place any used dressings or infusion bags into a plastic bag. Throw that bag in the trash. Keep the dressing that covers the needle clean and dry. Do not get it wet. Do not use scissors or sharp objects near the tube. Keep the tube clamped, unless it is being used. Check your port site every day for signs of infection. Check for: Redness, swelling, or  pain. Fluid or blood. Pus or a bad smell. Protect the skin around the port site. Avoid wearing bra straps that rub or irritate the site. Protect the skin around your port from seat belts. Place a soft pad over your chest if needed. Bathe or shower as told by your health care provider. The site may get wet as long as you are not actively receiving an infusion. Return to your normal activities as told by your health care provider. Ask your health care provider what activities are safe for you. Carry a medical alert card or wear a medical alert bracelet at all times. This will let health care providers know that you have an implanted port in case of an emergency. Get help right away if: You have redness, swelling, or pain at the port site. You have fluid or blood coming from your port site. You have pus or a bad smell coming from the port site. You have a fever. Summary Implanted ports are usually placed in the chest for long-term IV access. Follow instructions from your health care provider about flushing the port and changing bandages (dressings). Take care of the area around your port by avoiding clothing that puts pressure on the area, and by watching for signs of infection. Protect the skin around your port from seat belts. Place a soft pad over your chest if needed. Get help right away if you have a fever or you have redness, swelling, pain, drainage, or a bad smell at the port site. This information is not intended to replace advice given to you by your health care provider. Make sure you discuss any questions you have with your healthcare provider. Document Revised: 10/24/2019 Document Reviewed: 10/24/2019 Elsevier Patient Education  2022 Elsevier Inc.  

## 2020-12-27 ENCOUNTER — Other Ambulatory Visit: Payer: No Typology Code available for payment source

## 2020-12-27 ENCOUNTER — Ambulatory Visit: Payer: No Typology Code available for payment source

## 2021-01-03 ENCOUNTER — Other Ambulatory Visit: Payer: No Typology Code available for payment source

## 2021-01-03 ENCOUNTER — Ambulatory Visit: Payer: No Typology Code available for payment source

## 2021-01-03 NOTE — Progress Notes (Signed)
[place]  [phone] [phone]     ID: Melissa Mcgrath DOB: [birth year]  MR#: [id number]  GYK#:[id number]  Patient Care Team: Doree Albee, MD as PCP - General (Internal Medicine) Mauro Kaufmann, RN as Oncology Nurse Navigator Rockwell Germany, RN as Oncology Nurse Navigator Magrinat, Virgie Dad, MD as Consulting Physician (Oncology) Stark Klein, MD as Consulting Physician (General Surgery) Gery Pray, MD as Consulting Physician (Radiation Oncology) Dian Queen, MD as Consulting Physician (Obstetrics and Gynecology) Ulla Gallo, MD as Consulting Physician (Dermatology) Scot Dock, NP OTHER MD:   CHIEF COMPLAINT: Estrogen receptor positive breast cancer  CURRENT TREATMENT: Neoadjuvant chemotherapy   INTERVAL HISTORY: Melissa Mcgrath returns today for follow up and treatment of her estrogen receptor positive breast cancer.  She is accompanied by her mother  She completed her 4 cycles of cyclophosphamide and doxorubicin 11/15/2020.  We gave her an extra week to recover since those treatments were really rough on her.    She is currently receiving weekly Paclitaxel.  Today is week 4 of 12 doses planned.  There was a 2 week treatment interruption due to her daughter having a horseback riding competition in [employer].  She is exceptionally excited about this because her daughter won the top award in this competition!    Melissa Mcgrath is feeling very well today.  She denies any new issues/concerns. She notes that she underwent an ultrasound at [facility] on 12/19/2020 and it showed a decrease from 2 x 0.8 x 1.6 cm to 1.9 x  0.4 x 1.1 cm and axillary tail lymph node has decreased in size as well as the adjacent lymph node to the tumor.     REVIEW OF SYSTEMS: Review of Systems  Constitutional:  Negative for appetite change, chills, fatigue, fever and unexpected weight change.  HENT:   Negative for hearing loss, lump/mass and trouble swallowing.    Eyes:  Negative for eye problems and icterus.  Respiratory:  Negative for chest tightness, cough and shortness of breath.   Cardiovascular:  Negative for chest pain, leg swelling and palpitations.  Gastrointestinal:  Negative for abdominal distention, abdominal pain, constipation, diarrhea, nausea and vomiting.  Endocrine: Negative for hot flashes.  Genitourinary:  Negative for difficulty urinating.   Musculoskeletal:  Negative for arthralgias.  Skin:  Negative for itching and rash.  Neurological:  Negative for dizziness, extremity weakness, headaches and numbness.  Hematological:  Negative for adenopathy. Does not bruise/bleed easily.  Psychiatric/Behavioral:  Negative for depression. The patient is not nervous/anxious.     COVID 19 VACCINATION STATUS: [place] x2, most recently 01/2020, no booster as of 08/29/2020   HISTORY OF CURRENT ILLNESS: From the original intake note:  Melissa Mcgrath herself palpated an upper-outer right breast lump and noted associated intermittent pain and itching. She underwent bilateral diagnostic mammography with tomography and right breast ultrasonography at [facility] on 08/20/2020 showing: breast density category B; palpable 2.1 cm irregular mass in right breast at 9 o'clock; three abnormal-appearing right axillary tail nodes; indeterminate 4 mm oval mass located 1.8 cm lateral to dominant mass.  Accordingly on 08/21/2020 she proceeded to biopsy of the right breast area in question. The pathology from this procedure ([id number]) showed: invasive mammary carcinoma, e-cadherin positive, grade 3. Prognostic indicators significant for: estrogen receptor, 90% positive with strong staining intensity and progesterone receptor, 2% positive with weak staining intensity. Proliferation marker Ki67 at 40%. HER2 equivocal by immunohistochemistry (2+), but negative by fluorescent in situ hybridization (signals ratio and  number per cell not available today).  The biopsied lymph node  in the right axillary tail also showed invasive ductal carcinoma. No lymph node tissue was identified, possibly representing an entirely replaced lymph node.  Cancer Staging Malignant neoplasm of upper-outer quadrant of right breast in female, estrogen receptor positive ([place]) Staging form: Breast, AJCC 8th Edition - Clinical stage from 08/29/2020: Stage IIIA (cT2, cN2, cM0, G3, ER+, PR+, HER2-) - Signed by Gardenia Phlegm, NP on 09/07/2020 Stage prefix: Initial diagnosis Stage used in treatment planning: Yes National guidelines used in treatment planning: Yes Type of national guideline used in treatment planning: NCCN  The patient's subsequent history is as detailed below.   PAST MEDICAL HISTORY: Past Medical History:  Diagnosis Date   BV (bacterial vaginosis) 10/27/2014   Depression 01/05/2014   Facial basal cell cancer    Family history of breast cancer    Family history of melanoma    Family history of ovarian cancer    Family history of pancreatic cancer    Menopause 01/05/2014   Vaginal atrophy 11/09/2014   Vaginal itching 10/27/2014    PAST SURGICAL HISTORY: Past Surgical History:  Procedure Laterality Date   BREAST ENHANCEMENT SURGERY     CARPAL TUNNEL RELEASE Right    PORTACATH PLACEMENT N/A 09/17/2020   Procedure: INSERTION PORT-A-CATH;  Surgeon: Stark Klein, MD;  Location: [facility];  Service: General;  Laterality: N/A;   refractive lensectomy Bilateral     FAMILY HISTORY: Family History  Problem Relation Age of Onset   Breast cancer Mother 76       breast   Hypertension Father    Heart attack Father    Alzheimer's disease Father    Fibromyalgia Sister    Diabetes Sister    Heart attack Sister    Hypertension Sister    Hypertension Brother    Diabetes Brother    Pancreatic cancer Maternal Grandmother 83       Pancreatic   Melanoma Paternal Uncle        dx 51s   Melanoma Paternal Uncle        dx 73s   Ovarian cancer Paternal Aunt         dx 50s/60s  Her father died at age 28 from Alzheimer's. Her mother died at age 82 from metastatic breast cancer. She was initially diagnosed with breast cancer at age 18. Melissa Mcgrath has two brothers and one sister. In addition to her mother, she reports cancer in a paternal aunt (ovarian in mid 102's) and in her maternal grandmother (pancreatic at age 69).    GYNECOLOGIC HISTORY:  No LMP recorded. Patient is postmenopausal. Menarche: 59 years old Age at first live birth: 60 years old [place] P 2 LMP 2010 Contraceptive: used for approx. 20 years HRT used from 11/2019 until diagnosis  Hysterectomy? no BSO? 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 Husband Melissa [place] "Fritz Pickerel" is retired from working for [employer]. For exercise, she walks daily, rides horses every other week, and does farm work daily. Daughter Melissa Mcgrath, age 59, is a horse trainer/instructor. Daughter Melissa Mcgrath, age 33, is a Copywriter, advertising in [place]. Melissa Mcgrath has one grandchild. She attends [employer].    ADVANCED DIRECTIVES: In the absence of any documentation to the contrary, the patient's spouse is their HCPOA.    HEALTH MAINTENANCE: Social History   Tobacco Use   Smoking status: Never   Smokeless tobacco: Never  Vaping Use  Vaping Use: Never used  Substance Use Topics   Alcohol use: No   Drug use: No     Colonoscopy: never done  PAP: 01/2020  Bone density: never done   No Known Allergies  Current Outpatient Medications  Medication Sig Dispense Refill   lidocaine-prilocaine (EMLA) cream Apply 1 application topically as needed. 30 g 1   loratadine (CLARITIN) 10 MG tablet Take 1 tablet (10 mg total) by mouth daily. 90 tablet 0   Multiple Vitamin (MULTIVITAMIN) tablet Take 1 tablet by mouth daily.     ondansetron (ZOFRAN) 8 MG tablet Take 1 tablet (8 mg total) by mouth every 8 (eight) hours as needed for nausea or vomiting. Start day 3 after chemotherapy. 20 tablet 0    prochlorperazine (COMPAZINE) 10 MG tablet Take 0.5 tablets (5 mg total) by mouth every 6 (six) hours as needed (Nausea or vomiting). 30 tablet 1   triamcinolone lotion (KENALOG) 0.1 % Apply 1 application topically 3 (three) times daily. 120 mL 3   valACYclovir (VALTREX) 1000 MG tablet Take 1 tablet (1,000 mg total) by mouth daily. 30 tablet 0   docusate sodium (COLACE) 100 MG capsule TAKE 2 CAPSULES BY MOUTH TWICE DAILY AS NEEDED FOR CONSTIPATION (Patient not taking: Reported on 01/10/2021) 60 capsule 2   LORazepam (ATIVAN) 0.5 MG tablet Take 1 tablet (0.5 mg total) by mouth at bedtime as needed (Nausea or vomiting). (Patient not taking: Reported on 01/10/2021) 20 tablet 0   magic mouthwash SOLN 5 ml swish and spit QID prn, oral tenderness (Patient not taking: Reported on 01/10/2021) 240 mL 2   polyethylene glycol powder (MIRALAX) 17 GM/SCOOP powder Take 17 g by mouth daily as needed. (Patient not taking: Reported on 01/10/2021) 255 g 0   No current facility-administered medications for this visit.    OBJECTIVE: [place] woman in no acute distress  Vitals:   01/10/21 0848  BP: 118/65  Pulse: 88  Resp: 16  Temp: 97.7 F (36.5 C)  SpO2: 100%      Body mass index is 21.13 kg/m.   Wt Readings from Last 3 Encounters:  01/10/21 139 lb (63 kg)  12/20/20 136 lb 12.8 oz (62.1 kg)  12/13/20 135 lb (61.2 kg)     ECOG FS:1 - Symptomatic but completely ambulatory GENERAL: Patient is a well appearing female in no acute distress HEENT:  Sclerae anicteric.  Oropharynx clear and moist. No ulcerations or evidence of oropharyngeal candidiasis. Neck is supple.  NODES:  No cervical, supraclavicular, or axillary lymphadenopathy palpated.  BREAST EXAM:  Deferred. LUNGS:  Clear to auscultation bilaterally.  No wheezes or rhonchi. HEART:  Regular rate and rhythm. No murmur appreciated. ABDOMEN:  Soft, nontender.  Positive, normoactive bowel sounds. No organomegaly palpated. MSK:  No focal spinal tenderness to  palpation. Full range of motion bilaterally in the upper extremities. EXTREMITIES:  No peripheral edema.   SKIN:  Clear with no obvious rashes or skin changes. No nail dyscrasia. NEURO:  Nonfocal. Well oriented.  Appropriate affect.    LAB RESULTS:  CMP     Component Value Date/Time   NA 142 12/20/2020 0835   K 3.7 12/20/2020 0835   CL 110 12/20/2020 0835   CO2 22 12/20/2020 0835   GLUCOSE 122 (H) 12/20/2020 0835   BUN 12 12/20/2020 0835   CREATININE 0.75 12/20/2020 0835   CREATININE 0.79 12/07/2020 0832   CREATININE 0.76 12/20/2013 1207   CALCIUM 8.8 (L) 12/20/2020 0835   PROT 6.4 (L) 12/20/2020 5284  ALBUMIN 3.5 12/20/2020 0835   AST 15 12/20/2020 0835   AST 21 12/07/2020 0832   ALT 15 12/20/2020 0835   ALT 19 12/07/2020 0832   ALKPHOS 62 12/20/2020 0835   BILITOT 0.4 12/20/2020 0835   BILITOT 0.5 12/07/2020 0832   GFRNONAA >60 12/20/2020 0835   GFRNONAA >60 12/07/2020 0832    No results found for: TOTALPROTELP, ALBUMINELP, A1GS, A2GS, BETS, BETA2SER, GAMS, MSPIKE, SPEI  Lab Results  Component Value Date   WBC 2.8 (L) 12/20/2020   NEUTROABS 1.8 12/20/2020   HGB 9.4 (L) 12/20/2020   HCT 27.8 (L) 12/20/2020   MCV 101.5 (H) 12/20/2020   PLT 220 12/20/2020    No results found for: LABCA2  No components found for: XAJOIN867  No results for input(s): INR in the last 168 hours.  No results found for: LABCA2  No results found for: EHM094  No results found for: BSJ628  No results found for: ZMO294  No results found for: CA2729  No components found for: HGQUANT  No results found for: CEA1 / No results found for: CEA1   No results found for: AFPTUMOR  No results found for: CHROMOGRNA  No results found for: KPAFRELGTCHN, LAMBDASER, KAPLAMBRATIO (kappa/lambda light chains)  No results found for: HGBA, HGBA2QUANT, HGBFQUANT, HGBSQUAN (Hemoglobinopathy evaluation)   No results found for: LDH  No results found for: IRON, TIBC, IRONPCTSAT (Iron and  TIBC)  No results found for: FERRITIN  Urinalysis No results found for: COLORURINE, APPEARANCEUR, LABSPEC, PHURINE, GLUCOSEU, HGBUR, BILIRUBINUR, KETONESUR, PROTEINUR, UROBILINOGEN, NITRITE, LEUKOCYTESUR   STUDIES: No results found.   ELIGIBLE FOR AVAILABLE RESEARCH PROTOCOL: no  ASSESSMENT: 60 y.o. [place] woman status post right breast upper outer quadrant biopsy 08/21/2020 for a clinical mT2 N1, stage IIA/B invasive ductal carcinoma, grade 3, estrogen receptor positive, progesterone receptor weakly positive, HER-2 not amplified, with an MIB-1 of 40%  (a) breast MRI 09/05/2020 shows a clinical T2 N2, stage IIIA disease  (b) bone scan and chest CT scan 09/14/2020 show no evidence of metastatic disease  (1) genetics testing 09/18/2020 through the  [facility]-Expanded + RNAinsight panel found no deleterious mutations in AIP, ALK, APC, ATM, AXIN2, BAP1, BARD1, BLM, BMPR1A, BRCA1, BRCA2, BRIP1, CDC73, CDH1, CDK4, CDKN1B, CDKN2A, CHEK2, CTNNA1, DICER1, FANCC, FH, FLCN, GALNT12, KIF1B, LZTR1, MAX, MEN1, MET, MLH1, MSH2, MSH3, MSH6, MUTYH, NBN, NF1, NF2, NTHL1, PALB2, PHOX2B, PMS2, POT1, PRKAR1A, PTCH1, PTEN, RAD51C, RAD51D, RB1, RECQL, RET, SDHA, SDHAF2, SDHB, SDHC, SDHD, SMAD4, SMARCA4, SMARCB1, SMARCE1, STK11, SUFU, TMEM127, TP53, TSC1, TSC2, VHL and XRCC2 (sequencing and deletion/duplication); EGFR, EGLN1, HOXB13, KIT, MITF, PDGFRA, POLD1 and POLE (sequencing only); EPCAM and GREM1 (deletion/duplication only). RNA data is routinely analyzed for use in variant interpretation for all genes.  (2) neoadjuvant chemotherapy consisting of cyclophosphamide and doxorubicin in dose dense fashion x4 started 09/18/2020, completed 11/15/2020, followed by weekly paclitaxel x12 started 12/07/2020  (a) echo 09/11/2020 shows an ejection fraction in the 60-65% range  (b) CA cycles 3 and 4 were each delayed by 1 week given multiple side effects  (c) start of weekly paclitaxel also delayed 1 week for the  same reason  (3) definitive surgery to follow: Patient is considering bilateral mastectomies without reconstruction  (4) adjuvant radiation  (5) adjuvant antiestrogens   PLAN: Jerriann is doing quite well.  She will proceed with her fourth cycle of neoadjuvant Paclitaxel today.  Her CBC is stable, I reviewed it with her in detail, her CMET is pending at the time of appt completion.  We will continue to monitor her closely for peripheral neuropathy which she has not yet developed.    Melissa Mcgrath and I reviewed her ultrasound report which noted a decrease in the size of her breast cancer in the breast and resolution of the abnormal lymph node.  This is very encouraging.  Melissa Mcgrath can no longer feel the breast mass that she once felt.  I let her know to keep an eye on it, and if she starts to feel it again to let us know immediately, which she verbalized that she would do.  I recommended Melissa Mcgrath remain with her current activity to avoid deconditioning.  She and I discussed her surgery choice, and she says that she has decided for bilateral mastectomies without reconstruction.  I gave her a brochure for [facility], I recommended that she visit there prior to surgery so she has a good understanding of what the prosthetics, bras, etc will be like.    We will see Melissa Mcgrath with every other Pacliatxel treatment which she will continue weekly.  She knows to call for any questions that may arise between now and her next appointment.  We are happy to see her sooner if needed.   Total encounter time 33 minutes in chart review, lab review, face to face visit time, care coordination, and documentation of the encounter.   Wilber Bihari, NP 01/10/21 8:52 AM Medical Oncology and Hematology [facility] [place], [place] [address] Tel. [phone]    Fax. [phone]     *Total Encounter Time as defined by the Centers for Medicare and Medicaid Services includes, in addition to the  face-to-face time of a patient visit (documented in the note above) non-face-to-face time: obtaining and reviewing outside history, ordering and reviewing medications, tests or procedures, care coordination (communications with other health care professionals or caregivers) and documentation in the medical record.

## 2021-01-10 ENCOUNTER — Inpatient Hospital Stay (HOSPITAL_BASED_OUTPATIENT_CLINIC_OR_DEPARTMENT_OTHER): Payer: No Typology Code available for payment source | Admitting: Adult Health

## 2021-01-10 ENCOUNTER — Other Ambulatory Visit: Payer: Self-pay

## 2021-01-10 ENCOUNTER — Inpatient Hospital Stay: Payer: No Typology Code available for payment source | Attending: Adult Health

## 2021-01-10 ENCOUNTER — Inpatient Hospital Stay: Payer: No Typology Code available for payment source

## 2021-01-10 ENCOUNTER — Encounter: Payer: Self-pay | Admitting: Adult Health

## 2021-01-10 ENCOUNTER — Encounter: Payer: Self-pay | Admitting: *Deleted

## 2021-01-10 VITALS — BP 118/65 | HR 88 | Temp 97.7°F | Resp 16 | Ht 68.0 in | Wt 139.0 lb

## 2021-01-10 DIAGNOSIS — Z170 Estrogen receptor positive status [ER+]: Secondary | ICD-10-CM | POA: Insufficient documentation

## 2021-01-10 DIAGNOSIS — C50411 Malignant neoplasm of upper-outer quadrant of right female breast: Secondary | ICD-10-CM

## 2021-01-10 DIAGNOSIS — Z5111 Encounter for antineoplastic chemotherapy: Secondary | ICD-10-CM | POA: Insufficient documentation

## 2021-01-10 DIAGNOSIS — Z95828 Presence of other vascular implants and grafts: Secondary | ICD-10-CM

## 2021-01-10 LAB — CMP (CANCER CENTER ONLY)
ALT: 21 U/L (ref 0–44)
AST: 23 U/L (ref 15–41)
Albumin: 3.9 g/dL (ref 3.5–5.0)
Alkaline Phosphatase: 66 U/L (ref 38–126)
Anion gap: 6 (ref 5–15)
BUN: 10 mg/dL (ref 6–20)
CO2: 27 mmol/L (ref 22–32)
Calcium: 9.2 mg/dL (ref 8.9–10.3)
Chloride: 109 mmol/L (ref 98–111)
Creatinine: 0.68 mg/dL (ref 0.44–1.00)
GFR, Estimated: >60 mL/min (ref >60)
Glucose, Bld: 98 mg/dL (ref 70–99)
Potassium: 3.6 mmol/L (ref 3.5–5.1)
Sodium: 142 mmol/L (ref 135–145)
Total Bilirubin: 0.5 mg/dL (ref 0.3–1.2)
Total Protein: 7 g/dL (ref 6.5–8.1)

## 2021-01-10 LAB — CBC WITH DIFFERENTIAL/PLATELET
Abs Immature Granulocytes: 0.02 10*3/uL (ref 0.00–0.07)
Basophils Absolute: 0.1 10*3/uL (ref 0.0–0.1)
Basophils Relative: 2 %
Eosinophils Absolute: 0.1 10*3/uL (ref 0.0–0.5)
Eosinophils Relative: 2 %
HCT: 33.1 % — ABNORMAL LOW (ref 36.0–46.0)
Hemoglobin: 10.9 g/dL — ABNORMAL LOW (ref 12.0–15.0)
Immature Granulocytes: 0 %
Lymphocytes Relative: 18 %
Lymphs Abs: 0.8 10*3/uL (ref 0.7–4.0)
MCH: 33.1 pg (ref 26.0–34.0)
MCHC: 32.9 g/dL (ref 30.0–36.0)
MCV: 100.6 fL — ABNORMAL HIGH (ref 80.0–100.0)
Monocytes Absolute: 0.9 10*3/uL (ref 0.1–1.0)
Monocytes Relative: 18 %
Neutro Abs: 2.9 10*3/uL (ref 1.7–7.7)
Neutrophils Relative %: 60 %
Platelets: 246 10*3/uL (ref 150–400)
RBC: 3.29 MIL/uL — ABNORMAL LOW (ref 3.87–5.11)
RDW: 12.9 % (ref 11.5–15.5)
WBC: 4.8 10*3/uL (ref 4.0–10.5)
nRBC: 0 % (ref 0.0–0.2)

## 2021-01-10 MED ORDER — DIPHENHYDRAMINE HCL 50 MG/ML IJ SOLN
INTRAMUSCULAR | Status: AC
  Filled 2021-01-10: qty 1

## 2021-01-10 MED ORDER — COLD PACK MISC ONCOLOGY
1.0000 | Freq: Once | Status: DC | PRN
  Filled 2021-01-10: qty 1

## 2021-01-10 MED ORDER — FAMOTIDINE 20 MG IN NS 100 ML IVPB
INTRAVENOUS | Status: AC
  Filled 2021-01-10: qty 100

## 2021-01-10 MED ORDER — SODIUM CHLORIDE 0.9 % IV SOLN
80.0000 mg/m2 | Freq: Once | INTRAVENOUS | Status: AC
  Administered 2021-01-10: 138 mg via INTRAVENOUS
  Filled 2021-01-10: qty 23

## 2021-01-10 MED ORDER — DEXAMETHASONE SODIUM PHOSPHATE 10 MG/ML IJ SOLN
INTRAMUSCULAR | Status: AC
  Filled 2021-01-10: qty 1

## 2021-01-10 MED ORDER — FAMOTIDINE 20 MG IN NS 100 ML IVPB
20.0000 mg | Freq: Once | INTRAVENOUS | Status: AC
Start: 2021-01-10 — End: 2021-01-10
  Administered 2021-01-10: 20 mg via INTRAVENOUS

## 2021-01-10 MED ORDER — SODIUM CHLORIDE 0.9 % IV SOLN
Freq: Once | INTRAVENOUS | Status: AC
  Filled 2021-01-10: qty 250

## 2021-01-10 MED ORDER — DEXAMETHASONE SODIUM PHOSPHATE 10 MG/ML IJ SOLN
4.0000 mg | Freq: Once | INTRAMUSCULAR | Status: AC
  Administered 2021-01-10: 4 mg via INTRAVENOUS

## 2021-01-10 MED ORDER — SODIUM CHLORIDE 0.9% FLUSH
10.0000 mL | INTRAVENOUS | Status: DC | PRN
  Filled 2021-01-10: qty 10

## 2021-01-10 MED ORDER — HEPARIN SOD (PORK) LOCK FLUSH 100 UNIT/ML IV SOLN
500.0000 [IU] | Freq: Once | INTRAVENOUS | Status: DC | PRN
  Filled 2021-01-10: qty 5

## 2021-01-10 MED ORDER — DIPHENHYDRAMINE HCL 50 MG/ML IJ SOLN
25.0000 mg | Freq: Once | INTRAMUSCULAR | Status: AC
  Administered 2021-01-10: 25 mg via INTRAVENOUS

## 2021-01-10 MED ORDER — SODIUM CHLORIDE 0.9% FLUSH
10.0000 mL | Freq: Once | INTRAVENOUS | Status: AC
  Administered 2021-01-10: 10 mL
  Filled 2021-01-10: qty 10

## 2021-01-10 NOTE — Patient Instructions (Signed)
[facility]  Discharge Instructions: Thank you for choosing [facility] to provide your oncology and hematology care.   If you have a lab appointment with the Cancer Center, please go directly to the Cancer Center and check in at the registration area.   Wear comfortable clothing and clothing appropriate for easy access to any Portacath or PICC line.   We strive to give you quality time with your provider. You may need to reschedule your appointment if you arrive late (15 or more minutes).  Arriving late affects you and other patients whose appointments are after yours.  Also, if you miss three or more appointments without notifying the office, you may be dismissed from the clinic at the provider's discretion.      For prescription refill requests, have your pharmacy contact our office and allow 72 hours for refills to be completed.    Today you received the following chemotherapy and/or immunotherapy agent: Paclitaxel (Taxol)   To help prevent nausea and vomiting after your treatment, we encourage you to take your nausea medication as directed.  BELOW ARE SYMPTOMS THAT SHOULD BE REPORTED IMMEDIATELY: *FEVER GREATER THAN 100.4 F (38 C) OR HIGHER *CHILLS OR SWEATING *NAUSEA AND VOMITING THAT IS NOT CONTROLLED WITH YOUR NAUSEA MEDICATION *UNUSUAL SHORTNESS OF BREATH *UNUSUAL BRUISING OR BLEEDING *URINARY PROBLEMS (pain or burning when urinating, or frequent urination) *BOWEL PROBLEMS (unusual diarrhea, constipation, pain near the anus) TENDERNESS IN MOUTH AND THROAT WITH OR WITHOUT PRESENCE OF ULCERS (sore throat, sores in mouth, or a toothache) UNUSUAL RASH, SWELLING OR PAIN  UNUSUAL VAGINAL DISCHARGE OR ITCHING   Items with * indicate a potential emergency and should be followed up as soon as possible or go to the Emergency Department if any problems should occur.  Please show the CHEMOTHERAPY ALERT CARD or IMMUNOTHERAPY ALERT CARD at  check-in to the Emergency Department and triage nurse.  Should you have questions after your visit or need to cancel or reschedule your appointment, please contact [facility]  Dept: [phone]  and follow the prompts.  Office hours are 8:00 a.m. to 4:30 p.m. Monday - Friday. Please note that voicemails left after 4:00 p.m. may not be returned until the following business day.  We are closed weekends and major holidays. You have access to a nurse at all times for urgent questions. Please call the main number to the clinic Dept: [phone] and follow the prompts.   For any non-urgent questions, you may also contact your provider using MyChart. We now offer e-Visits for anyone 18 and older to request care online for non-urgent symptoms. For details visit [email or web address].   Also download the MyChart app! Go to the app store, search "MyChart", open the app, select [facility], and log in with your MyChart username and password.  Due to Covid, a mask is required upon entering the hospital/clinic. If you do not have a mask, one will be given to you upon arrival. For doctor visits, patients may have 1 support person aged 18 or older with them. For treatment visits, patients cannot have anyone with them due to current Covid guidelines and our immunocompromised population.  

## 2021-01-17 ENCOUNTER — Other Ambulatory Visit: Payer: Self-pay

## 2021-01-17 ENCOUNTER — Inpatient Hospital Stay: Payer: No Typology Code available for payment source

## 2021-01-17 VITALS — BP 119/49 | HR 72 | Temp 97.7°F | Resp 16 | Wt 139.2 lb

## 2021-01-17 DIAGNOSIS — Z5111 Encounter for antineoplastic chemotherapy: Secondary | ICD-10-CM | POA: Diagnosis not present

## 2021-01-17 DIAGNOSIS — C50411 Malignant neoplasm of upper-outer quadrant of right female breast: Secondary | ICD-10-CM

## 2021-01-17 DIAGNOSIS — Z95828 Presence of other vascular implants and grafts: Secondary | ICD-10-CM

## 2021-01-17 DIAGNOSIS — Z170 Estrogen receptor positive status [ER+]: Secondary | ICD-10-CM

## 2021-01-17 LAB — CBC WITH DIFFERENTIAL/PLATELET
Abs Immature Granulocytes: 0.02 10*3/uL (ref 0.00–0.07)
Basophils Absolute: 0.1 10*3/uL (ref 0.0–0.1)
Basophils Relative: 2 %
Eosinophils Absolute: 0.1 10*3/uL (ref 0.0–0.5)
Eosinophils Relative: 4 %
HCT: 29.6 % — ABNORMAL LOW (ref 36.0–46.0)
Hemoglobin: 10.1 g/dL — ABNORMAL LOW (ref 12.0–15.0)
Immature Granulocytes: 1 %
Lymphocytes Relative: 22 %
Lymphs Abs: 0.7 10*3/uL (ref 0.7–4.0)
MCH: 33.7 pg (ref 26.0–34.0)
MCHC: 34.1 g/dL (ref 30.0–36.0)
MCV: 98.7 fL (ref 80.0–100.0)
Monocytes Absolute: 0.3 10*3/uL (ref 0.1–1.0)
Monocytes Relative: 9 %
Neutro Abs: 1.9 10*3/uL (ref 1.7–7.7)
Neutrophils Relative %: 62 %
Platelets: 203 10*3/uL (ref 150–400)
RBC: 3 MIL/uL — ABNORMAL LOW (ref 3.87–5.11)
RDW: 12.4 % (ref 11.5–15.5)
WBC: 3.1 10*3/uL — ABNORMAL LOW (ref 4.0–10.5)
nRBC: 0 % (ref 0.0–0.2)

## 2021-01-17 LAB — COMPREHENSIVE METABOLIC PANEL
ALT: 17 U/L (ref 0–44)
AST: 18 U/L (ref 15–41)
Albumin: 3.6 g/dL (ref 3.5–5.0)
Alkaline Phosphatase: 69 U/L (ref 38–126)
Anion gap: 7 (ref 5–15)
BUN: 10 mg/dL (ref 6–20)
CO2: 27 mmol/L (ref 22–32)
Calcium: 9.5 mg/dL (ref 8.9–10.3)
Chloride: 109 mmol/L (ref 98–111)
Creatinine, Ser: 0.77 mg/dL (ref 0.44–1.00)
GFR, Estimated: >60 mL/min (ref >60)
Glucose, Bld: 96 mg/dL (ref 70–99)
Potassium: 3.9 mmol/L (ref 3.5–5.1)
Sodium: 143 mmol/L (ref 135–145)
Total Bilirubin: 0.4 mg/dL (ref 0.3–1.2)
Total Protein: 6.6 g/dL (ref 6.5–8.1)

## 2021-01-17 MED ORDER — FAMOTIDINE 20 MG IN NS 100 ML IVPB
20.0000 mg | Freq: Once | INTRAVENOUS | Status: AC
  Administered 2021-01-17: 20 mg via INTRAVENOUS

## 2021-01-17 MED ORDER — DEXAMETHASONE SODIUM PHOSPHATE 10 MG/ML IJ SOLN
INTRAMUSCULAR | Status: AC
  Filled 2021-01-17: qty 1

## 2021-01-17 MED ORDER — SODIUM CHLORIDE 0.9 % IV SOLN
80.0000 mg/m2 | Freq: Once | INTRAVENOUS | Status: AC
  Administered 2021-01-17: 138 mg via INTRAVENOUS
  Filled 2021-01-17: qty 23

## 2021-01-17 MED ORDER — SODIUM CHLORIDE 0.9% FLUSH
10.0000 mL | INTRAVENOUS | Status: DC | PRN
  Administered 2021-01-17: 10 mL
  Filled 2021-01-17: qty 10

## 2021-01-17 MED ORDER — DIPHENHYDRAMINE HCL 50 MG/ML IJ SOLN
INTRAMUSCULAR | Status: AC
  Filled 2021-01-17: qty 1

## 2021-01-17 MED ORDER — DIPHENHYDRAMINE HCL 50 MG/ML IJ SOLN
25.0000 mg | Freq: Once | INTRAMUSCULAR | Status: AC
  Administered 2021-01-17: 25 mg via INTRAVENOUS

## 2021-01-17 MED ORDER — SODIUM CHLORIDE 0.9% FLUSH
10.0000 mL | Freq: Once | INTRAVENOUS | Status: AC
  Administered 2021-01-17: 10 mL
  Filled 2021-01-17: qty 10

## 2021-01-17 MED ORDER — FAMOTIDINE 20 MG IN NS 100 ML IVPB
INTRAVENOUS | Status: AC
  Filled 2021-01-17: qty 100

## 2021-01-17 MED ORDER — HEPARIN SOD (PORK) LOCK FLUSH 100 UNIT/ML IV SOLN
500.0000 [IU] | Freq: Once | INTRAVENOUS | Status: AC | PRN
  Administered 2021-01-17: 500 [IU]
  Filled 2021-01-17: qty 5

## 2021-01-17 MED ORDER — DEXAMETHASONE SODIUM PHOSPHATE 10 MG/ML IJ SOLN
4.0000 mg | Freq: Once | INTRAMUSCULAR | Status: AC
  Administered 2021-01-17: 4 mg via INTRAVENOUS

## 2021-01-17 MED ORDER — SODIUM CHLORIDE 0.9 % IV SOLN
Freq: Once | INTRAVENOUS | Status: AC
  Filled 2021-01-17: qty 250

## 2021-01-17 NOTE — Patient Instructions (Signed)
[facility]  Discharge Instructions: Thank you for choosing [facility] to provide your oncology and hematology care.   If you have a lab appointment with the Cancer Center, please go directly to the Cancer Center and check in at the registration area.   Wear comfortable clothing and clothing appropriate for easy access to any Portacath or PICC line.   We strive to give you quality time with your provider. You may need to reschedule your appointment if you arrive late (15 or more minutes).  Arriving late affects you and other patients whose appointments are after yours.  Also, if you miss three or more appointments without notifying the office, you may be dismissed from the clinic at the provider's discretion.      For prescription refill requests, have your pharmacy contact our office and allow 72 hours for refills to be completed.    Today you received the following chemotherapy and/or immunotherapy agent: Paclitaxel (Taxol)   To help prevent nausea and vomiting after your treatment, we encourage you to take your nausea medication as directed.  BELOW ARE SYMPTOMS THAT SHOULD BE REPORTED IMMEDIATELY: *FEVER GREATER THAN 100.4 F (38 C) OR HIGHER *CHILLS OR SWEATING *NAUSEA AND VOMITING THAT IS NOT CONTROLLED WITH YOUR NAUSEA MEDICATION *UNUSUAL SHORTNESS OF BREATH *UNUSUAL BRUISING OR BLEEDING *URINARY PROBLEMS (pain or burning when urinating, or frequent urination) *BOWEL PROBLEMS (unusual diarrhea, constipation, pain near the anus) TENDERNESS IN MOUTH AND THROAT WITH OR WITHOUT PRESENCE OF ULCERS (sore throat, sores in mouth, or a toothache) UNUSUAL RASH, SWELLING OR PAIN  UNUSUAL VAGINAL DISCHARGE OR ITCHING   Items with * indicate a potential emergency and should be followed up as soon as possible or go to the Emergency Department if any problems should occur.  Please show the CHEMOTHERAPY ALERT CARD or IMMUNOTHERAPY ALERT CARD at  check-in to the Emergency Department and triage nurse.  Should you have questions after your visit or need to cancel or reschedule your appointment, please contact [facility]  Dept: [phone]  and follow the prompts.  Office hours are 8:00 a.m. to 4:30 p.m. Monday - Friday. Please note that voicemails left after 4:00 p.m. may not be returned until the following business day.  We are closed weekends and major holidays. You have access to a nurse at all times for urgent questions. Please call the main number to the clinic Dept: [phone] and follow the prompts.   For any non-urgent questions, you may also contact your provider using MyChart. We now offer e-Visits for anyone 18 and older to request care online for non-urgent symptoms. For details visit [email or web address].   Also download the MyChart app! Go to the app store, search "MyChart", open the app, select , and log in with your MyChart username and password.  Due to Covid, a mask is required upon entering the hospital/clinic. If you do not have a mask, one will be given to you upon arrival. For doctor visits, patients may have 1 support person aged 18 or older with them. For treatment visits, patients cannot have anyone with them due to current Covid guidelines and our immunocompromised population.  

## 2021-01-23 NOTE — Progress Notes (Addendum)
[facility]  [phone] [phone]     ID: Melissa Mcgrath DOB: 28-Apr-1961  MR#: [id number]  JYN#:[id number]  Patient Care Team: Doree Albee, MD as PCP - General (Internal Medicine) Mauro Kaufmann, RN as Oncology Nurse Navigator Rockwell Germany, RN as Oncology Nurse Navigator Magrinat, Virgie Dad, MD as Consulting Physician (Oncology) Stark Klein, MD as Consulting Physician (General Surgery) Gery Pray, MD as Consulting Physician (Radiation Oncology) Dian Queen, MD as Consulting Physician (Obstetrics and Gynecology) Ulla Gallo, MD as Consulting Physician (Dermatology) Scot Dock, NP OTHER MD:   CHIEF COMPLAINT: Estrogen receptor positive breast cancer  CURRENT TREATMENT: Neoadjuvant chemotherapy   INTERVAL HISTORY: Melissa Mcgrath returns today for follow up and treatment of her estrogen receptor positive breast cancer.  She completed her 4 cycles of cyclophosphamide and doxorubicin 11/15/2020 and received an extra week to recover since those treatments were really rough on her.    She is currently receiving weekly Paclitaxel.  Today is week 6 of 12 doses planned.  She went to the beach over the weekend and noted she pulled a muscle and her back is now sore.  She is not as active since pulling that muscle and she is taking Advil, 2 tablets three times a day.  She also has some mild fatigue.    She underwent an ultrasound at [facility] on 12/19/2020 and it showed a decrease from 2 x 0.8 x 1.6 cm to 1.9 x  0.4 x 1.1 cm and axillary tail lymph node has decreased in size as well as the adjacent lymph node to the tumor.     REVIEW OF SYSTEMS: Review of Systems  Constitutional:  Positive for fatigue. Negative for appetite change, chills, fever and unexpected weight change.  HENT:   Negative for hearing loss, lump/mass, mouth sores and trouble swallowing.   Eyes:  Negative for eye problems and icterus.  Respiratory:  Negative for chest  tightness, cough and shortness of breath.   Cardiovascular:  Negative for chest pain, leg swelling and palpitations.  Gastrointestinal:  Negative for abdominal distention, abdominal pain, constipation, diarrhea, nausea and vomiting.  Endocrine: Negative for hot flashes.  Genitourinary:  Negative for difficulty urinating.   Musculoskeletal:  Positive for back pain (see interval history). Negative for arthralgias.  Skin:  Negative for itching and rash.  Neurological:  Negative for dizziness, extremity weakness, headaches and numbness.  Hematological:  Negative for adenopathy. Does not bruise/bleed easily.  Psychiatric/Behavioral:  Negative for depression. The patient is not nervous/anxious.     COVID 19 VACCINATION STATUS: [place] x2, most recently 01/2020, no booster as of 08/29/2020   HISTORY OF CURRENT ILLNESS: From the original intake note:  Melissa Mcgrath herself palpated an upper-outer right breast lump and noted associated intermittent pain and itching. She underwent bilateral diagnostic mammography with tomography and right breast ultrasonography at [facility] on 08/20/2020 showing: breast density category B; palpable 2.1 cm irregular mass in right breast at 9 o'clock; three abnormal-appearing right axillary tail nodes; indeterminate 4 mm oval mass located 1.8 cm lateral to dominant mass.  Accordingly on 08/21/2020 she proceeded to biopsy of the right breast area in question. The pathology from this procedure ([id number]) showed: invasive mammary carcinoma, e-cadherin positive, grade 3. Prognostic indicators significant for: estrogen receptor, 90% positive with strong staining intensity and progesterone receptor, 2% positive with weak staining intensity. Proliferation marker Ki67 at 40%. HER2 equivocal by immunohistochemistry (2+), but negative by fluorescent in situ hybridization (signals ratio and number  per cell not available today).  The biopsied lymph node in the right axillary tail also  showed invasive ductal carcinoma. No lymph node tissue was identified, possibly representing an entirely replaced lymph node.  Cancer Staging Malignant neoplasm of upper-outer quadrant of right breast in female, estrogen receptor positive ([place]) Staging form: Breast, AJCC 8th Edition - Clinical stage from 08/29/2020: Stage IIIA (cT2, cN2, cM0, G3, ER+, PR+, HER2-) - Signed by Gardenia Phlegm, NP on 09/07/2020 Stage prefix: Initial diagnosis Stage used in treatment planning: Yes National guidelines used in treatment planning: Yes Type of national guideline used in treatment planning: NCCN  The patient's subsequent history is as detailed below.   PAST MEDICAL HISTORY: Past Medical History:  Diagnosis Date   Depression 01/05/2014   Facial basal cell cancer    Family history of breast cancer    Family history of melanoma    Family history of ovarian cancer    Family history of pancreatic cancer    Menopause 01/05/2014   Vaginal atrophy 11/09/2014    PAST SURGICAL HISTORY: Past Surgical History:  Procedure Laterality Date   BREAST ENHANCEMENT SURGERY     CARPAL TUNNEL RELEASE Right    PORTACATH PLACEMENT N/A 09/17/2020   Procedure: INSERTION PORT-A-CATH;  Surgeon: Stark Klein, MD;  Location: [facility];  Service: General;  Laterality: N/A;   refractive lensectomy Bilateral     FAMILY HISTORY: Family History  Problem Relation Age of Onset   Breast cancer Mother 56       breast   Hypertension Father    Heart attack Father    Alzheimer's disease Father    Fibromyalgia Sister    Diabetes Sister    Heart attack Sister    Hypertension Sister    Hypertension Brother    Diabetes Brother    Pancreatic cancer Maternal Grandmother 83       Pancreatic   Melanoma Paternal Uncle        dx 101s   Melanoma Paternal Uncle        dx 71s   Ovarian cancer Paternal Aunt        dx 50s/60s  Her father died at age 52 from Alzheimer's. Her mother died at age 68 from  metastatic breast cancer. She was initially diagnosed with breast cancer at age 53. Melissa Mcgrath has two brothers and one sister. In addition to her mother, she reports cancer in a paternal aunt (ovarian in mid 26's) and in her maternal grandmother (pancreatic at age 49).    GYNECOLOGIC HISTORY:  No LMP recorded. Patient is postmenopausal. Menarche: 60 years old Age at first live birth: 60 years old [place] P 2 LMP 2010 Contraceptive: used for approx. 20 years HRT used from 11/2019 until diagnosis  Hysterectomy? no BSO? 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 Husband Herbie Baltimore "Fritz Pickerel" is retired from working for [employer]. For exercise, she walks daily, rides horses every other week, and does farm work daily. Daughter Cline Cools, age 76, is a horse trainer/instructor. Daughter Lavell Anchors, age 7, is a Copywriter, advertising in [place]. Francisco has one grandchild. She attends [employer].    ADVANCED DIRECTIVES: In the absence of any documentation to the contrary, the patient's spouse is their HCPOA.    HEALTH MAINTENANCE: Social History   Tobacco Use   Smoking status: Never   Smokeless tobacco: Never  Vaping Use   Vaping Use: Never used  Substance Use Topics   Alcohol  use: No   Drug use: No     Colonoscopy: never done  PAP: 01/2020  Bone density: never done   No Known Allergies  Current Outpatient Medications  Medication Sig Dispense Refill   ibuprofen (ADVIL) 200 MG tablet Take 400 mg by mouth 3 (three) times daily as needed.     docusate sodium (COLACE) 100 MG capsule TAKE 2 CAPSULES BY MOUTH TWICE DAILY AS NEEDED FOR CONSTIPATION (Patient not taking: Reported on 01/10/2021) 60 capsule 2   lidocaine-prilocaine (EMLA) cream Apply 1 application topically as needed. 30 g 1   loratadine (CLARITIN) 10 MG tablet Take 1 tablet (10 mg total) by mouth daily. 90 tablet 0   LORazepam (ATIVAN) 0.5 MG tablet Take 1 tablet (0.5 mg total) by mouth at bedtime as needed  (Nausea or vomiting). (Patient not taking: Reported on 01/10/2021) 20 tablet 0   magic mouthwash SOLN 5 ml swish and spit QID prn, oral tenderness (Patient not taking: Reported on 01/10/2021) 240 mL 2   Multiple Vitamin (MULTIVITAMIN) tablet Take 1 tablet by mouth daily.     ondansetron (ZOFRAN) 8 MG tablet Take 1 tablet (8 mg total) by mouth every 8 (eight) hours as needed for nausea or vomiting. Start day 3 after chemotherapy. 20 tablet 0   polyethylene glycol powder (MIRALAX) 17 GM/SCOOP powder Take 17 g by mouth daily as needed. (Patient not taking: Reported on 01/10/2021) 255 g 0   prochlorperazine (COMPAZINE) 10 MG tablet Take 0.5 tablets (5 mg total) by mouth every 6 (six) hours as needed (Nausea or vomiting). 30 tablet 1   triamcinolone lotion (KENALOG) 0.1 % Apply 1 application topically 3 (three) times daily. 120 mL 3   valACYclovir (VALTREX) 1000 MG tablet Take 1 tablet (1,000 mg total) by mouth daily. 30 tablet 0   No current facility-administered medications for this visit.    OBJECTIVE: White woman in no acute distress  Vitals:   01/24/21 0905  BP: (!) 119/47  Pulse: 76  Resp: 16  Temp: 97.8 F (36.6 C)  SpO2: 100%      Body mass index is 21.12 kg/m.   Wt Readings from Last 3 Encounters:  01/24/21 138 lb 14.4 oz (63 kg)  01/17/21 139 lb 4 oz (63.2 kg)  01/10/21 139 lb (63 kg)     ECOG FS:1 - Symptomatic but completely ambulatory GENERAL: Patient is a well appearing female in no acute distress HEENT:  Sclerae anicteric.  Oropharynx clear and moist. No ulcerations or evidence of oropharyngeal candidiasis. Neck is supple.  NODES:  No cervical, supraclavicular, or axillary lymphadenopathy palpated.  BREAST EXAM:  no sign of progression LUNGS:  Clear to auscultation bilaterally.  No wheezes or rhonchi. HEART:  Regular rate and rhythm. No murmur appreciated. ABDOMEN:  Soft, nontender.  Positive, normoactive bowel sounds. No organomegaly palpated. MSK:  No focal spinal  tenderness to palpation. Full range of motion bilaterally in the upper extremities. EXTREMITIES:  No peripheral edema.   SKIN:  Clear with no obvious rashes or skin changes. No nail dyscrasia. NEURO:  Nonfocal. Well oriented.  Appropriate affect.    LAB RESULTS:  CMP     Component Value Date/Time   NA 143 01/17/2021 0852   K 3.9 01/17/2021 0852   CL 109 01/17/2021 0852   CO2 27 01/17/2021 0852   GLUCOSE 96 01/17/2021 0852   BUN 10 01/17/2021 0852   CREATININE 0.77 01/17/2021 0852   CREATININE 0.68 01/10/2021 0837   CREATININE 0.76 12/20/2013 1207  CALCIUM 9.5 01/17/2021 0852   PROT 6.6 01/17/2021 0852   ALBUMIN 3.6 01/17/2021 0852   AST 18 01/17/2021 0852   AST 23 01/10/2021 0837   ALT 17 01/17/2021 0852   ALT 21 01/10/2021 0837   ALKPHOS 69 01/17/2021 0852   BILITOT 0.4 01/17/2021 0852   BILITOT 0.5 01/10/2021 0837   GFRNONAA >60 01/17/2021 0852   GFRNONAA >60 01/10/2021 0837    No results found for: TOTALPROTELP, ALBUMINELP, A1GS, A2GS, BETS, BETA2SER, GAMS, MSPIKE, SPEI  Lab Results  Component Value Date   WBC 3.1 (L) 01/17/2021   NEUTROABS 1.9 01/17/2021   HGB 10.1 (L) 01/17/2021   HCT 29.6 (L) 01/17/2021   MCV 98.7 01/17/2021   PLT 203 01/17/2021    No results found for: LABCA2  No components found for: IOXBDZ329  No results for input(s): INR in the last 168 hours.  No results found for: LABCA2  No results found for: JME268  No results found for: TMH962  No results found for: IWL798  No results found for: CA2729  No components found for: HGQUANT  No results found for: CEA1 / No results found for: CEA1   No results found for: AFPTUMOR  No results found for: CHROMOGRNA  No results found for: KPAFRELGTCHN, LAMBDASER, KAPLAMBRATIO (kappa/lambda light chains)  No results found for: HGBA, HGBA2QUANT, HGBFQUANT, HGBSQUAN (Hemoglobinopathy evaluation)   No results found for: LDH  No results found for: IRON, TIBC, IRONPCTSAT (Iron and  TIBC)  No results found for: FERRITIN  Urinalysis No results found for: COLORURINE, APPEARANCEUR, LABSPEC, PHURINE, GLUCOSEU, HGBUR, BILIRUBINUR, KETONESUR, PROTEINUR, UROBILINOGEN, NITRITE, LEUKOCYTESUR   STUDIES: No results found.   ELIGIBLE FOR AVAILABLE RESEARCH PROTOCOL: no  ASSESSMENT: 60 y.o. [place] woman status post right breast upper outer quadrant biopsy 08/21/2020 for a clinical mT2 N1, stage IIA/B invasive ductal carcinoma, grade 3, estrogen receptor positive, progesterone receptor weakly positive, HER-2 not amplified, with an MIB-1 of 40%  (a) breast MRI 09/05/2020 shows a clinical T2 N2, stage IIIA disease  (b) bone scan and chest CT scan 09/14/2020 show no evidence of metastatic disease  (1) genetics testing 09/18/2020 through the  [facility]-Expanded + RNAinsight panel found no deleterious mutations in AIP, ALK, APC, ATM, AXIN2, BAP1, BARD1, BLM, BMPR1A, BRCA1, BRCA2, BRIP1, CDC73, CDH1, CDK4, CDKN1B, CDKN2A, CHEK2, CTNNA1, DICER1, FANCC, FH, FLCN, GALNT12, KIF1B, LZTR1, MAX, MEN1, MET, MLH1, MSH2, MSH3, MSH6, MUTYH, NBN, NF1, NF2, NTHL1, PALB2, PHOX2B, PMS2, POT1, PRKAR1A, PTCH1, PTEN, RAD51C, RAD51D, RB1, RECQL, RET, SDHA, SDHAF2, SDHB, SDHC, SDHD, SMAD4, SMARCA4, SMARCB1, SMARCE1, STK11, SUFU, TMEM127, TP53, TSC1, TSC2, VHL and XRCC2 (sequencing and deletion/duplication); EGFR, EGLN1, HOXB13, KIT, MITF, PDGFRA, POLD1 and POLE (sequencing only); EPCAM and GREM1 (deletion/duplication only). RNA data is routinely analyzed for use in variant interpretation for all genes.  (2) neoadjuvant chemotherapy consisting of cyclophosphamide and doxorubicin in dose dense fashion x4 started 09/18/2020, completed 11/15/2020, followed by weekly paclitaxel x12 started 12/07/2020  (a) echo 09/11/2020 shows an ejection fraction in the 60-65% range  (b) CA cycles 3 and 4 were each delayed by 1 week given multiple side effects  (c) start of weekly paclitaxel also delayed 1 week for the  same reason  (3) definitive surgery to follow: Patient is considering bilateral mastectomies without reconstruction  (4) adjuvant radiation  (5) adjuvant antiestrogens   PLAN: Analysse is doing quite well.  Her labs are pending, and she will proceed with treatment today so long as they are within parameters.  She will continue  on weekly Taxol, with today being cycle 6 of therapy.  She has no peripheral neuropathy and we will continue to monitor her closely for this.    She unfortunately pulled her back and advil TID is helping.  She will receive some IV steroids today with treatment, and we will see how that helps as well.    I briefly reviewed Rori's health maintenance.  We discussed all adults should undergo HIV and hepatitis C screening.  She has not undergone those, so she will have these done when they draw labs from her port prior to chemo.  She is agreeable to this.    Baker Janus let me know that her PCP unexpectedly passed away last week, she is devastated on what she should do about a new PCP, because she loved Dr. Anastasio Champion.  I have reached out to their office and to [facility] administration to better understand what the plan is for his patients.    Nijae will return weekly for labs and chemo and every other week we will see her.  She knows to call for any questions that may arise between now and her next appointment.  We are happy to see her sooner if needed.  Total encounter time 30 minutes in chart review, lab review, face to face visit time, care coordination, and documentation of the encounter.   Wilber Bihari, NP 01/24/21 9:11 AM Medical Oncology and Hematology [facility] [place], [place] [address] Tel. [phone]    Fax. [phone]  *Total Encounter Time as defined by the Centers for Medicare and Medicaid Services includes, in addition to the face-to-face time of a patient visit (documented in the note above) non-face-to-face time: obtaining and reviewing  outside history, ordering and reviewing medications, tests or procedures, care coordination (communications with other health care professionals or caregivers) and documentation in the medical record.   ADDENDUM:   After appointment completion, received notification that Kinze's ANC is 0.9.  She will not receive treatment today and per Dr. Jana Hakim we will decrease her next dose by 10%.

## 2021-01-24 ENCOUNTER — Other Ambulatory Visit: Payer: Self-pay

## 2021-01-24 ENCOUNTER — Inpatient Hospital Stay: Payer: No Typology Code available for payment source | Attending: Adult Health

## 2021-01-24 ENCOUNTER — Encounter: Payer: Self-pay | Admitting: Adult Health

## 2021-01-24 ENCOUNTER — Inpatient Hospital Stay (HOSPITAL_BASED_OUTPATIENT_CLINIC_OR_DEPARTMENT_OTHER): Payer: No Typology Code available for payment source | Admitting: Adult Health

## 2021-01-24 ENCOUNTER — Encounter: Payer: Self-pay | Admitting: Oncology

## 2021-01-24 ENCOUNTER — Inpatient Hospital Stay: Payer: No Typology Code available for payment source

## 2021-01-24 VITALS — BP 119/47 | HR 76 | Temp 97.8°F | Resp 16 | Wt 138.9 lb

## 2021-01-24 DIAGNOSIS — Z1159 Encounter for screening for other viral diseases: Secondary | ICD-10-CM | POA: Diagnosis not present

## 2021-01-24 DIAGNOSIS — Z170 Estrogen receptor positive status [ER+]: Secondary | ICD-10-CM | POA: Diagnosis not present

## 2021-01-24 DIAGNOSIS — C50411 Malignant neoplasm of upper-outer quadrant of right female breast: Secondary | ICD-10-CM

## 2021-01-24 DIAGNOSIS — Z114 Encounter for screening for human immunodeficiency virus [HIV]: Secondary | ICD-10-CM

## 2021-01-24 DIAGNOSIS — Z5111 Encounter for antineoplastic chemotherapy: Secondary | ICD-10-CM | POA: Diagnosis not present

## 2021-01-24 DIAGNOSIS — Z95828 Presence of other vascular implants and grafts: Secondary | ICD-10-CM

## 2021-01-24 LAB — COMPREHENSIVE METABOLIC PANEL
ALT: 18 U/L (ref 0–44)
AST: 19 U/L (ref 15–41)
Albumin: 3.6 g/dL (ref 3.5–5.0)
Alkaline Phosphatase: 62 U/L (ref 38–126)
Anion gap: 6 (ref 5–15)
BUN: 13 mg/dL (ref 6–20)
CO2: 25 mmol/L (ref 22–32)
Calcium: 9 mg/dL (ref 8.9–10.3)
Chloride: 110 mmol/L (ref 98–111)
Creatinine, Ser: 0.71 mg/dL (ref 0.44–1.00)
GFR, Estimated: >60 mL/min (ref >60)
Glucose, Bld: 83 mg/dL (ref 70–99)
Potassium: 3.9 mmol/L (ref 3.5–5.1)
Sodium: 141 mmol/L (ref 135–145)
Total Bilirubin: 0.4 mg/dL (ref 0.3–1.2)
Total Protein: 6.4 g/dL — ABNORMAL LOW (ref 6.5–8.1)

## 2021-01-24 LAB — CBC WITH DIFFERENTIAL/PLATELET
Abs Immature Granulocytes: 0.01 10*3/uL (ref 0.00–0.07)
Basophils Absolute: 0 10*3/uL (ref 0.0–0.1)
Basophils Relative: 2 %
Eosinophils Absolute: 0.1 10*3/uL (ref 0.0–0.5)
Eosinophils Relative: 6 %
HCT: 30.5 % — ABNORMAL LOW (ref 36.0–46.0)
Hemoglobin: 10.1 g/dL — ABNORMAL LOW (ref 12.0–15.0)
Immature Granulocytes: 1 %
Lymphocytes Relative: 33 %
Lymphs Abs: 0.6 10*3/uL — ABNORMAL LOW (ref 0.7–4.0)
MCH: 33.3 pg (ref 26.0–34.0)
MCHC: 33.1 g/dL (ref 30.0–36.0)
MCV: 100.7 fL — ABNORMAL HIGH (ref 80.0–100.0)
Monocytes Absolute: 0.2 10*3/uL (ref 0.1–1.0)
Monocytes Relative: 11 %
Neutro Abs: 0.9 10*3/uL — ABNORMAL LOW (ref 1.7–7.7)
Neutrophils Relative %: 47 %
Platelets: 248 10*3/uL (ref 150–400)
RBC: 3.03 MIL/uL — ABNORMAL LOW (ref 3.87–5.11)
RDW: 12.7 % (ref 11.5–15.5)
WBC: 1.9 10*3/uL — ABNORMAL LOW (ref 4.0–10.5)
nRBC: 0 % (ref 0.0–0.2)

## 2021-01-24 MED ORDER — HEPARIN SOD (PORK) LOCK FLUSH 100 UNIT/ML IV SOLN
500.0000 [IU] | Freq: Once | INTRAVENOUS | Status: AC
  Administered 2021-01-24: 500 [IU]
  Filled 2021-01-24: qty 5

## 2021-01-24 MED ORDER — SODIUM CHLORIDE 0.9 % IV SOLN
INTRAVENOUS | Status: DC
  Filled 2021-01-24: qty 250

## 2021-01-24 MED ORDER — SODIUM CHLORIDE 0.9% FLUSH
10.0000 mL | Freq: Once | INTRAVENOUS | Status: AC
  Administered 2021-01-24: 10 mL
  Filled 2021-01-24: qty 10

## 2021-01-24 NOTE — Progress Notes (Signed)
Per Dr. Jana Hakim, no treatment today

## 2021-01-29 ENCOUNTER — Encounter: Payer: Self-pay | Admitting: Oncology

## 2021-01-31 ENCOUNTER — Inpatient Hospital Stay: Payer: No Typology Code available for payment source

## 2021-01-31 ENCOUNTER — Other Ambulatory Visit: Payer: Self-pay

## 2021-01-31 VITALS — BP 137/82 | HR 76 | Resp 16 | Wt 142.8 lb

## 2021-01-31 DIAGNOSIS — Z95828 Presence of other vascular implants and grafts: Secondary | ICD-10-CM

## 2021-01-31 DIAGNOSIS — Z170 Estrogen receptor positive status [ER+]: Secondary | ICD-10-CM

## 2021-01-31 DIAGNOSIS — C50411 Malignant neoplasm of upper-outer quadrant of right female breast: Secondary | ICD-10-CM

## 2021-01-31 DIAGNOSIS — Z5111 Encounter for antineoplastic chemotherapy: Secondary | ICD-10-CM | POA: Diagnosis not present

## 2021-01-31 LAB — COMPREHENSIVE METABOLIC PANEL
ALT: 26 U/L (ref 0–44)
AST: 22 U/L (ref 15–41)
Albumin: 3.6 g/dL (ref 3.5–5.0)
Alkaline Phosphatase: 73 U/L (ref 38–126)
Anion gap: 8 (ref 5–15)
BUN: 12 mg/dL (ref 6–20)
CO2: 26 mmol/L (ref 22–32)
Calcium: 9.2 mg/dL (ref 8.9–10.3)
Chloride: 109 mmol/L (ref 98–111)
Creatinine, Ser: 0.82 mg/dL (ref 0.44–1.00)
GFR, Estimated: >60 mL/min (ref >60)
Glucose, Bld: 104 mg/dL — ABNORMAL HIGH (ref 70–99)
Potassium: 3.9 mmol/L (ref 3.5–5.1)
Sodium: 143 mmol/L (ref 135–145)
Total Bilirubin: 0.3 mg/dL (ref 0.3–1.2)
Total Protein: 6.5 g/dL (ref 6.5–8.1)

## 2021-01-31 LAB — CBC WITH DIFFERENTIAL/PLATELET
Abs Immature Granulocytes: 0.01 10*3/uL (ref 0.00–0.07)
Basophils Absolute: 0 10*3/uL (ref 0.0–0.1)
Basophils Relative: 1 %
Eosinophils Absolute: 0.1 10*3/uL (ref 0.0–0.5)
Eosinophils Relative: 3 %
HCT: 32.1 % — ABNORMAL LOW (ref 36.0–46.0)
Hemoglobin: 10.7 g/dL — ABNORMAL LOW (ref 12.0–15.0)
Immature Granulocytes: 0 %
Lymphocytes Relative: 24 %
Lymphs Abs: 0.7 10*3/uL (ref 0.7–4.0)
MCH: 32.9 pg (ref 26.0–34.0)
MCHC: 33.3 g/dL (ref 30.0–36.0)
MCV: 98.8 fL (ref 80.0–100.0)
Monocytes Absolute: 0.6 10*3/uL (ref 0.1–1.0)
Monocytes Relative: 19 %
Neutro Abs: 1.6 10*3/uL — ABNORMAL LOW (ref 1.7–7.7)
Neutrophils Relative %: 53 %
Platelets: 260 10*3/uL (ref 150–400)
RBC: 3.25 MIL/uL — ABNORMAL LOW (ref 3.87–5.11)
RDW: 12.8 % (ref 11.5–15.5)
WBC: 3.1 10*3/uL — ABNORMAL LOW (ref 4.0–10.5)
nRBC: 0 % (ref 0.0–0.2)

## 2021-01-31 MED ORDER — SODIUM CHLORIDE 0.9 % IV SOLN
Freq: Once | INTRAVENOUS | Status: AC
  Filled 2021-01-31: qty 250

## 2021-01-31 MED ORDER — SODIUM CHLORIDE 0.9 % IV SOLN
70.0000 mg/m2 | Freq: Once | INTRAVENOUS | Status: AC
  Administered 2021-01-31: 120 mg via INTRAVENOUS
  Filled 2021-01-31: qty 20

## 2021-01-31 MED ORDER — DIPHENHYDRAMINE HCL 50 MG/ML IJ SOLN
INTRAMUSCULAR | Status: AC
  Administered 2021-01-31: 25 mg via INTRAVENOUS
  Filled 2021-01-31: qty 1

## 2021-01-31 MED ORDER — HEPARIN SOD (PORK) LOCK FLUSH 100 UNIT/ML IV SOLN
500.0000 [IU] | Freq: Once | INTRAVENOUS | Status: AC | PRN
  Administered 2021-01-31: 500 [IU]
  Filled 2021-01-31: qty 5

## 2021-01-31 MED ORDER — SODIUM CHLORIDE 0.9% FLUSH
10.0000 mL | Freq: Once | INTRAVENOUS | Status: AC
  Administered 2021-01-31: 10 mL
  Filled 2021-01-31: qty 10

## 2021-01-31 MED ORDER — DEXAMETHASONE SODIUM PHOSPHATE 10 MG/ML IJ SOLN
4.0000 mg | Freq: Once | INTRAMUSCULAR | Status: AC
  Administered 2021-01-31: 4 mg via INTRAVENOUS
  Filled 2021-01-31: qty 1

## 2021-01-31 MED ORDER — DIPHENHYDRAMINE HCL 50 MG/ML IJ SOLN: 25.0000 mg | Freq: Once | INTRAMUSCULAR | Status: AC

## 2021-01-31 MED ORDER — FAMOTIDINE 20 MG IN NS 100 ML IVPB
20.0000 mg | Freq: Once | INTRAVENOUS | Status: AC
  Administered 2021-01-31: 20 mg via INTRAVENOUS
  Filled 2021-01-31: qty 100

## 2021-01-31 MED ORDER — SODIUM CHLORIDE 0.9% FLUSH
10.0000 mL | INTRAVENOUS | Status: DC | PRN
  Administered 2021-01-31: 10 mL
  Filled 2021-01-31: qty 10

## 2021-01-31 NOTE — Patient Instructions (Signed)
[facility]  Discharge Instructions: Thank you for choosing [facility] to provide your oncology and hematology care.   If you have a lab appointment with the [facility], please go directly to the [facility] and check in at the registration area.   Wear comfortable clothing and clothing appropriate for easy access to any Portacath or PICC line.   We strive to give you quality time with your provider. You may need to reschedule your appointment if you arrive late (15 or more minutes).  Arriving late affects you and other patients whose appointments are after yours.  Also, if you miss three or more appointments without notifying the office, you may be dismissed from the clinic at the provider's discretion.      For prescription refill requests, have your pharmacy contact our office and allow 72 hours for refills to be completed.    Today you received the following chemotherapy and/or immunotherapy agents: Taxol    To help prevent nausea and vomiting after your treatment, we encourage you to take your nausea medication as directed.  BELOW ARE SYMPTOMS THAT SHOULD BE REPORTED IMMEDIATELY: *FEVER GREATER THAN 100.4 F (38 C) OR HIGHER *CHILLS OR SWEATING *NAUSEA AND VOMITING THAT IS NOT CONTROLLED WITH YOUR NAUSEA MEDICATION *UNUSUAL SHORTNESS OF BREATH *UNUSUAL BRUISING OR BLEEDING *URINARY PROBLEMS (pain or burning when urinating, or frequent urination) *BOWEL PROBLEMS (unusual diarrhea, constipation, pain near the anus) TENDERNESS IN MOUTH AND THROAT WITH OR WITHOUT PRESENCE OF ULCERS (sore throat, sores in mouth, or a toothache) UNUSUAL RASH, SWELLING OR PAIN  UNUSUAL VAGINAL DISCHARGE OR ITCHING   Items with * indicate a potential emergency and should be followed up as soon as possible or go to the Emergency Department if any problems should occur.  Please show the CHEMOTHERAPY ALERT CARD or IMMUNOTHERAPY ALERT CARD at check-in to the  Emergency Department and triage nurse.  Should you have questions after your visit or need to cancel or reschedule your appointment, please contact [facility]  Dept: [phone]  and follow the prompts.  Office hours are 8:00 a.m. to 4:30 p.m. Monday - Friday. Please note that voicemails left after 4:00 p.m. may not be returned until the following business day.  We are closed weekends and major holidays. You have access to a nurse at all times for urgent questions. Please call the main number to the clinic Dept: [phone] and follow the prompts.   For any non-urgent questions, you may also contact your provider using MyChart. We now offer e-Visits for anyone 70 and older to request care online for non-urgent symptoms. For details visit [email or web address].   Also download the MyChart app! Go to the app store, search "MyChart", open the app, select [facility], and log in with your MyChart username and password.  Due to Covid, a mask is required upon entering the hospital/clinic. If you do not have a mask, one will be given to you upon arrival. For doctor visits, patients may have 1 support person aged 25 or older with them. For treatment visits, patients cannot have anyone with them due to current Covid guidelines and our immunocompromised population.

## 2021-02-07 ENCOUNTER — Inpatient Hospital Stay: Payer: No Typology Code available for payment source

## 2021-02-07 ENCOUNTER — Inpatient Hospital Stay (HOSPITAL_BASED_OUTPATIENT_CLINIC_OR_DEPARTMENT_OTHER): Payer: No Typology Code available for payment source | Admitting: Adult Health

## 2021-02-07 ENCOUNTER — Other Ambulatory Visit: Payer: Self-pay

## 2021-02-07 ENCOUNTER — Encounter: Payer: Self-pay | Admitting: Adult Health

## 2021-02-07 ENCOUNTER — Encounter: Payer: Self-pay | Admitting: Oncology

## 2021-02-07 VITALS — BP 110/62 | HR 77 | Temp 97.4°F | Resp 18 | Ht 68.0 in | Wt 142.4 lb

## 2021-02-07 DIAGNOSIS — Z170 Estrogen receptor positive status [ER+]: Secondary | ICD-10-CM

## 2021-02-07 DIAGNOSIS — Z1159 Encounter for screening for other viral diseases: Secondary | ICD-10-CM

## 2021-02-07 DIAGNOSIS — Z95828 Presence of other vascular implants and grafts: Secondary | ICD-10-CM

## 2021-02-07 DIAGNOSIS — C50411 Malignant neoplasm of upper-outer quadrant of right female breast: Secondary | ICD-10-CM | POA: Diagnosis not present

## 2021-02-07 DIAGNOSIS — Z5111 Encounter for antineoplastic chemotherapy: Secondary | ICD-10-CM | POA: Diagnosis not present

## 2021-02-07 DIAGNOSIS — Z114 Encounter for screening for human immunodeficiency virus [HIV]: Secondary | ICD-10-CM

## 2021-02-07 LAB — CBC WITH DIFFERENTIAL/PLATELET
Abs Immature Granulocytes: 0.03 10*3/uL (ref 0.00–0.07)
Basophils Absolute: 0.1 10*3/uL (ref 0.0–0.1)
Basophils Relative: 2 %
Eosinophils Absolute: 0.1 10*3/uL (ref 0.0–0.5)
Eosinophils Relative: 4 %
HCT: 30.9 % — ABNORMAL LOW (ref 36.0–46.0)
Hemoglobin: 10.5 g/dL — ABNORMAL LOW (ref 12.0–15.0)
Immature Granulocytes: 1 %
Lymphocytes Relative: 19 %
Lymphs Abs: 0.6 10*3/uL — ABNORMAL LOW (ref 0.7–4.0)
MCH: 33 pg (ref 26.0–34.0)
MCHC: 34 g/dL (ref 30.0–36.0)
MCV: 97.2 fL (ref 80.0–100.0)
Monocytes Absolute: 0.3 10*3/uL (ref 0.1–1.0)
Monocytes Relative: 9 %
Neutro Abs: 2.1 10*3/uL (ref 1.7–7.7)
Neutrophils Relative %: 65 %
Platelets: 216 10*3/uL (ref 150–400)
RBC: 3.18 MIL/uL — ABNORMAL LOW (ref 3.87–5.11)
RDW: 12.3 % (ref 11.5–15.5)
WBC: 3.2 10*3/uL — ABNORMAL LOW (ref 4.0–10.5)
nRBC: 0 % (ref 0.0–0.2)

## 2021-02-07 LAB — COMPREHENSIVE METABOLIC PANEL
ALT: 18 U/L (ref 0–44)
AST: 18 U/L (ref 15–41)
Albumin: 3.6 g/dL (ref 3.5–5.0)
Alkaline Phosphatase: 71 U/L (ref 38–126)
Anion gap: 9 (ref 5–15)
BUN: 14 mg/dL (ref 6–20)
CO2: 23 mmol/L (ref 22–32)
Calcium: 9.1 mg/dL (ref 8.9–10.3)
Chloride: 109 mmol/L (ref 98–111)
Creatinine, Ser: 0.82 mg/dL (ref 0.44–1.00)
GFR, Estimated: >60 mL/min (ref >60)
Glucose, Bld: 85 mg/dL (ref 70–99)
Potassium: 3.8 mmol/L (ref 3.5–5.1)
Sodium: 141 mmol/L (ref 135–145)
Total Bilirubin: 0.6 mg/dL (ref 0.3–1.2)
Total Protein: 6.8 g/dL (ref 6.5–8.1)

## 2021-02-07 LAB — HEPATITIS C ANTIBODY: HCV Ab: NONREACTIVE

## 2021-02-07 LAB — HIV ANTIBODY (ROUTINE TESTING W REFLEX): HIV Screen 4th Generation wRfx: NONREACTIVE

## 2021-02-07 MED ORDER — DEXAMETHASONE SODIUM PHOSPHATE 10 MG/ML IJ SOLN
4.0000 mg | Freq: Once | INTRAMUSCULAR | Status: AC
  Administered 2021-02-07: 4 mg via INTRAVENOUS
  Filled 2021-02-07: qty 1

## 2021-02-07 MED ORDER — DIPHENHYDRAMINE HCL 50 MG/ML IJ SOLN
25.0000 mg | Freq: Once | INTRAMUSCULAR | Status: AC
  Administered 2021-02-07: 25 mg via INTRAVENOUS
  Filled 2021-02-07: qty 1

## 2021-02-07 MED ORDER — HEPARIN SOD (PORK) LOCK FLUSH 100 UNIT/ML IV SOLN
500.0000 [IU] | Freq: Once | INTRAVENOUS | Status: AC | PRN
  Administered 2021-02-07: 500 [IU]

## 2021-02-07 MED ORDER — SODIUM CHLORIDE 0.9 % IV SOLN
70.0000 mg/m2 | Freq: Once | INTRAVENOUS | Status: AC
  Administered 2021-02-07: 120 mg via INTRAVENOUS
  Filled 2021-02-07: qty 20

## 2021-02-07 MED ORDER — SODIUM CHLORIDE 0.9% FLUSH
10.0000 mL | Freq: Once | INTRAVENOUS | Status: AC
  Administered 2021-02-07: 10 mL

## 2021-02-07 MED ORDER — SODIUM CHLORIDE 0.9 % IV SOLN: Freq: Once | INTRAVENOUS | Status: AC

## 2021-02-07 MED ORDER — FAMOTIDINE 20 MG IN NS 100 ML IVPB
20.0000 mg | Freq: Once | INTRAVENOUS | Status: AC
  Administered 2021-02-07: 20 mg via INTRAVENOUS
  Filled 2021-02-07: qty 100

## 2021-02-07 MED ORDER — SODIUM CHLORIDE 0.9% FLUSH
10.0000 mL | INTRAVENOUS | Status: DC | PRN
Start: 2021-02-07 — End: 2021-02-07
  Administered 2021-02-07: 10 mL

## 2021-02-07 NOTE — Patient Instructions (Signed)
[facility]  Discharge Instructions: Thank you for choosing [facility] to provide your oncology and hematology care.   If you have a lab appointment with the [facility], please go directly to the [facility] and check in at the registration area.   Wear comfortable clothing and clothing appropriate for easy access to any Portacath or PICC line.   We strive to give you quality time with your provider. You may need to reschedule your appointment if you arrive late (15 or more minutes).  Arriving late affects you and other patients whose appointments are after yours.  Also, if you miss three or more appointments without notifying the office, you may be dismissed from the clinic at the provider's discretion.      For prescription refill requests, have your pharmacy contact our office and allow 72 hours for refills to be completed.    Today you received the following chemotherapy and/or immunotherapy agents: Paclitaxel.      To help prevent nausea and vomiting after your treatment, we encourage you to take your nausea medication as directed.  BELOW ARE SYMPTOMS THAT SHOULD BE REPORTED IMMEDIATELY: *FEVER GREATER THAN 100.4 F (38 C) OR HIGHER *CHILLS OR SWEATING *NAUSEA AND VOMITING THAT IS NOT CONTROLLED WITH YOUR NAUSEA MEDICATION *UNUSUAL SHORTNESS OF BREATH *UNUSUAL BRUISING OR BLEEDING *URINARY PROBLEMS (pain or burning when urinating, or frequent urination) *BOWEL PROBLEMS (unusual diarrhea, constipation, pain near the anus) TENDERNESS IN MOUTH AND THROAT WITH OR WITHOUT PRESENCE OF ULCERS (sore throat, sores in mouth, or a toothache) UNUSUAL RASH, SWELLING OR PAIN  UNUSUAL VAGINAL DISCHARGE OR ITCHING   Items with * indicate a potential emergency and should be followed up as soon as possible or go to the Emergency Department if any problems should occur.  Please show the CHEMOTHERAPY ALERT CARD or IMMUNOTHERAPY ALERT CARD at check-in  to the Emergency Department and triage nurse.  Should you have questions after your visit or need to cancel or reschedule your appointment, please contact [facility]  Dept: [phone]  and follow the prompts.  Office hours are 8:00 a.m. to 4:30 p.m. Monday - Friday. Please note that voicemails left after 4:00 p.m. may not be returned until the following business day.  We are closed weekends and major holidays. You have access to a nurse at all times for urgent questions. Please call the main number to the clinic Dept: [phone] and follow the prompts.   For any non-urgent questions, you may also contact your provider using MyChart. We now offer e-Visits for anyone 71 and older to request care online for non-urgent symptoms. For details visit [email or web address].   Also download the MyChart app! Go to the app store, search "MyChart", open the app, select [facility], and log in with your MyChart username and password.  Due to Covid, a mask is required upon entering the hospital/clinic. If you do not have a mask, one will be given to you upon arrival. For doctor visits, patients may have 1 support person aged 31 or older with them. For treatment visits, patients cannot have anyone with them due to current Covid guidelines and our immunocompromised population.

## 2021-02-07 NOTE — Progress Notes (Signed)
Melissa Mcgrath  Telephone:([phone]) [phone] Fax:(336) [phone]     ID: Melissa Mcgrath DOB: [birth year]  MR#: [id number]  IHK#:[id number]  Patient Care Team: Melissa Albee, MD (Inactive) as PCP - General (Internal Medicine) Melissa Kaufmann, RN as Oncology Nurse Navigator Melissa Germany, RN as Oncology Nurse Navigator Magrinat, Virgie Dad, MD as Consulting Physician (Oncology) Melissa Klein, MD as Consulting Physician (General Surgery) Melissa Pray, MD as Consulting Physician (Radiation Oncology) Melissa Queen, MD as Consulting Physician (Obstetrics and Gynecology) Melissa Gallo, MD as Consulting Physician (Dermatology) Melissa Dock, NP OTHER MD:   CHIEF COMPLAINT: Estrogen receptor positive breast cancer  CURRENT TREATMENT: Neoadjuvant chemotherapy   INTERVAL HISTORY: Melissa Mcgrath returns today for follow up and treatment of her estrogen receptor positive breast cancer.  She completed her 4 cycles of cyclophosphamide and doxorubicin 11/15/2020 and received an extra week to recover since those treatments were really rough on her.    She is currently receiving weekly Paclitaxel.  Today is week 7 of 12 doses planned. She had neutropenia after her fifth cycle and subsequently received a slightly dose reduction in the paclitaxel.  Her neutrophils are 2.1 today.    Melissa Mcgrath is doing well today.  She note some mild fatigue the first few days after her chemotherapy, then she perks up and feels better.  She denies any peripheral neuropathy.      REVIEW OF SYSTEMS: Review of Systems  Constitutional:  Positive for fatigue. Negative for appetite change, chills, fever and unexpected weight change.  HENT:   Negative for hearing loss, lump/mass, mouth sores and trouble swallowing.   Eyes:  Negative for eye problems and icterus.  Respiratory:  Negative for chest tightness, cough and shortness of breath.   Cardiovascular:  Negative for chest pain, leg swelling and palpitations.   Gastrointestinal:  Negative for abdominal distention, abdominal pain, constipation, diarrhea, nausea and vomiting.  Endocrine: Negative for hot flashes.  Genitourinary:  Negative for difficulty urinating.   Musculoskeletal:  Negative for arthralgias and back pain.  Skin:  Negative for itching and rash.  Neurological:  Negative for dizziness, extremity weakness, headaches and numbness.  Hematological:  Negative for adenopathy. Does not bruise/bleed easily.  Psychiatric/Behavioral:  Negative for depression. The patient is not nervous/anxious.      COVID 19 VACCINATION STATUS: [name] x2, most recently 01/2020, no booster as of 08/29/2020   HISTORY OF CURRENT ILLNESS: From the original intake note:  Melissa Mcgrath herself palpated an upper-outer right breast lump and noted associated intermittent pain and itching. She underwent bilateral diagnostic mammography with tomography and right breast ultrasonography at [facility] on 08/20/2020 showing: breast density category B; palpable 2.1 cm irregular mass in right breast at 9 o'clock; three abnormal-appearing right axillary tail nodes; indeterminate 4 mm oval mass located 1.8 cm lateral to dominant mass.  Accordingly on 08/21/2020 she proceeded to biopsy of the right breast area in question. The pathology from this procedure ([id number]) showed: invasive mammary carcinoma, e-cadherin positive, grade 3. Prognostic indicators significant for: estrogen receptor, 90% positive with strong staining intensity and progesterone receptor, 2% positive with weak staining intensity. Proliferation marker Ki67 at 40%. HER2 equivocal by immunohistochemistry (2+), but negative by fluorescent in situ hybridization (signals ratio and number per cell not available today).  The biopsied lymph node in the right axillary tail also showed invasive ductal carcinoma. No lymph node tissue was identified, possibly representing an entirely replaced lymph node.  Cancer  Staging Malignant neoplasm of upper-outer quadrant of  right breast in female, estrogen receptor positive ([place]) Staging form: Breast, AJCC 8th Edition - Clinical stage from 08/29/2020: Stage IIIA (cT2, cN2, cM0, G3, ER+, PR+, HER2-) - Signed by Melissa Phlegm, NP on 09/07/2020 Stage prefix: Initial diagnosis Stage used in treatment planning: Yes National guidelines used in treatment planning: Yes Type of national guideline used in treatment planning: NCCN  The patient's subsequent history is as detailed below.   PAST MEDICAL HISTORY: Past Medical History:  Diagnosis Date   Depression 01/05/2014   Facial basal cell cancer    Family history of breast cancer    Family history of melanoma    Family history of ovarian cancer    Family history of pancreatic cancer    Menopause 01/05/2014   Vaginal atrophy 11/09/2014    PAST SURGICAL HISTORY: Past Surgical History:  Procedure Laterality Date   BREAST ENHANCEMENT SURGERY     CARPAL TUNNEL RELEASE Right    PORTACATH PLACEMENT N/A 09/17/2020   Procedure: INSERTION PORT-A-CATH;  Surgeon: Melissa Klein, MD;  Location: [facility];  Service: General;  Laterality: N/A;   refractive lensectomy Bilateral     FAMILY HISTORY: Family History  Problem Relation Age of Onset   Breast cancer Mother 19       breast   Hypertension Father    Heart attack Father    Alzheimer's disease Father    Fibromyalgia Sister    Diabetes Sister    Heart attack Sister    Hypertension Sister    Hypertension Brother    Diabetes Brother    Pancreatic cancer Maternal Grandmother 83       Pancreatic   Melanoma Paternal Uncle        dx 7s   Melanoma Paternal Uncle        dx 71s   Ovarian cancer Paternal Aunt        dx 50s/60s  Her father died at age 81 from Alzheimer's. Her mother died at age 63 from metastatic breast cancer. She was initially diagnosed with breast cancer at age 39. Melissa Mcgrath has two brothers and one sister. In addition to  her mother, she reports cancer in a paternal aunt (ovarian in mid 27's) and in her maternal grandmother (pancreatic at age 82).    GYNECOLOGIC HISTORY:  No LMP recorded. Patient is postmenopausal. Menarche: 60 years old Age at first live birth: 60 years old [place] P 2 LMP 2010 Contraceptive: used for approx. 20 years HRT used from 11/2019 until diagnosis  Hysterectomy? no BSO? 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 Husband Herbie Baltimore "Fritz Pickerel" is retired from working for [employer]. For exercise, she walks daily, rides horses every other week, and does farm work daily. Daughter Cline Cools, age 40, is a horse trainer/instructor. Daughter Lavell Anchors, age 32, is a Copywriter, advertising in [place]. Etoy has one grandchild. She attends [employer].    ADVANCED DIRECTIVES: In the absence of any documentation to the contrary, the patient's spouse is their HCPOA.    HEALTH MAINTENANCE: Social History   Tobacco Use   Smoking status: Never   Smokeless tobacco: Never  Vaping Use   Vaping Use: Never used  Substance Use Topics   Alcohol use: No   Drug use: No     Colonoscopy: never done  PAP: 01/2020  Bone density: never done   No Known Allergies  Current Outpatient Medications  Medication Sig Dispense Refill   docusate sodium (COLACE) 100 MG  capsule TAKE 2 CAPSULES BY MOUTH TWICE DAILY AS NEEDED FOR CONSTIPATION 60 capsule 2   ibuprofen (ADVIL) 200 MG tablet Take 400 mg by mouth 3 (three) times daily as needed.     lidocaine-prilocaine (EMLA) cream Apply 1 application topically as needed. 30 g 1   loratadine (CLARITIN) 10 MG tablet Take 1 tablet (10 mg total) by mouth daily. 90 tablet 0   LORazepam (ATIVAN) 0.5 MG tablet Take 1 tablet (0.5 mg total) by mouth at bedtime as needed (Nausea or vomiting). 20 tablet 0   magic mouthwash SOLN 5 ml swish and spit QID prn, oral tenderness 240 mL 2   Multiple Vitamin (MULTIVITAMIN) tablet Take 1 tablet by mouth  daily.     ondansetron (ZOFRAN) 8 MG tablet Take 1 tablet (8 mg total) by mouth every 8 (eight) hours as needed for nausea or vomiting. Start day 3 after chemotherapy. 20 tablet 0   polyethylene glycol powder (MIRALAX) 17 GM/SCOOP powder Take 17 g by mouth daily as needed. 255 g 0   prochlorperazine (COMPAZINE) 10 MG tablet Take 0.5 tablets (5 mg total) by mouth every 6 (six) hours as needed (Nausea or vomiting). 30 tablet 1   triamcinolone lotion (KENALOG) 0.1 % Apply 1 application topically 3 (three) times daily. 120 mL 3   valACYclovir (VALTREX) 1000 MG tablet Take 1 tablet (1,000 mg total) by mouth daily. 30 tablet 0   No current facility-administered medications for this visit.    OBJECTIVE: White woman in no acute distress  Vitals:   02/07/21 0930  BP: 110/62  Pulse: 77  Resp: 18  Temp: (!) 97.4 F (36.3 C)  SpO2: 100%       Body mass index is 21.65 kg/m.   Wt Readings from Last 3 Encounters:  02/07/21 142 lb 6.4 oz (64.6 kg)  01/31/21 142 lb 12 oz (64.8 kg)  01/24/21 138 lb 14.4 oz (63 kg)     ECOG FS:1 - Symptomatic but completely ambulatory GENERAL: Patient is a well appearing female in no acute distress HEENT:  Sclerae anicteric.  Oropharynx clear and moist. No ulcerations or evidence of oropharyngeal candidiasis. Neck is supple.  NODES:  No cervical, supraclavicular, or axillary lymphadenopathy palpated.  BREAST EXAM:  no sign of progression LUNGS:  Clear to auscultation bilaterally.  No wheezes or rhonchi. HEART:  Regular rate and rhythm. No murmur appreciated. ABDOMEN:  Soft, nontender.  Positive, normoactive bowel sounds. No organomegaly palpated. MSK:  No focal spinal tenderness to palpation. Full range of motion bilaterally in the upper extremities. EXTREMITIES:  No peripheral edema.   SKIN:  Clear with no obvious rashes or skin changes. No nail dyscrasia. NEURO:  Nonfocal. Well oriented.  Appropriate affect.    LAB RESULTS:  CMP     Component Value  Date/Time   NA 141 02/07/2021 0850   K 3.8 02/07/2021 0850   CL 109 02/07/2021 0850   CO2 23 02/07/2021 0850   GLUCOSE 85 02/07/2021 0850   BUN 14 02/07/2021 0850   CREATININE 0.82 02/07/2021 0850   CREATININE 0.68 01/10/2021 0837   CREATININE 0.76 12/20/2013 1207   CALCIUM 9.1 02/07/2021 0850   PROT 6.8 02/07/2021 0850   ALBUMIN 3.6 02/07/2021 0850   AST 18 02/07/2021 0850   AST 23 01/10/2021 0837   ALT 18 02/07/2021 0850   ALT 21 01/10/2021 0837   ALKPHOS 71 02/07/2021 0850   BILITOT 0.6 02/07/2021 0850   BILITOT 0.5 01/10/2021 0837   GFRNONAA >60 02/07/2021  [place] >60 01/10/2021 0837    No results found for: TOTALPROTELP, ALBUMINELP, A1GS, A2GS, BETS, BETA2SER, GAMS, MSPIKE, SPEI  Lab Results  Component Value Date   WBC 3.2 (L) 02/07/2021   NEUTROABS 2.1 02/07/2021   HGB 10.5 (L) 02/07/2021   HCT 30.9 (L) 02/07/2021   MCV 97.2 02/07/2021   PLT 216 02/07/2021    No results found for: LABCA2  No components found for: [id number]  No results for input(s): INR in the last 168 hours.  No results found for: LABCA2  No results found for: NWG956  No results found for: OZH086  No results found for: VHQ469  No results found for: CA2729  No components found for: HGQUANT  No results found for: CEA1 / No results found for: CEA1   No results found for: AFPTUMOR  No results found for: CHROMOGRNA  No results found for: KPAFRELGTCHN, LAMBDASER, KAPLAMBRATIO (kappa/lambda light chains)  No results found for: HGBA, HGBA2QUANT, HGBFQUANT, HGBSQUAN (Hemoglobinopathy evaluation)   No results found for: LDH  No results found for: IRON, TIBC, IRONPCTSAT (Iron and TIBC)  No results found for: FERRITIN  Urinalysis No results found for: COLORURINE, APPEARANCEUR, LABSPEC, PHURINE, GLUCOSEU, HGBUR, BILIRUBINUR, KETONESUR, PROTEINUR, UROBILINOGEN, NITRITE, LEUKOCYTESUR   STUDIES: No results found.   ELIGIBLE FOR AVAILABLE RESEARCH PROTOCOL:  no  ASSESSMENT: 60 y.o. [place] woman status post right breast upper outer quadrant biopsy 08/21/2020 for a clinical mT2 N1, stage IIA/B invasive ductal carcinoma, grade 3, estrogen receptor positive, progesterone receptor weakly positive, HER-2 not amplified, with an MIB-1 of 40%  (a) breast MRI 09/05/2020 shows a clinical T2 N2, stage IIIA disease  (b) bone scan and chest CT scan 09/14/2020 show no evidence of metastatic disease  (1) genetics testing 09/18/2020 through the  [facility]-Expanded + RNAinsight panel found no deleterious mutations in AIP, ALK, APC, ATM, AXIN2, BAP1, BARD1, BLM, BMPR1A, BRCA1, BRCA2, BRIP1, CDC73, CDH1, CDK4, CDKN1B, CDKN2A, CHEK2, CTNNA1, DICER1, FANCC, FH, FLCN, GALNT12, KIF1B, LZTR1, MAX, MEN1, MET, MLH1, MSH2, MSH3, MSH6, MUTYH, NBN, NF1, NF2, NTHL1, PALB2, PHOX2B, PMS2, POT1, PRKAR1A, PTCH1, PTEN, RAD51C, RAD51D, RB1, RECQL, RET, SDHA, SDHAF2, SDHB, SDHC, SDHD, SMAD4, SMARCA4, SMARCB1, SMARCE1, STK11, SUFU, TMEM127, TP53, TSC1, TSC2, VHL and XRCC2 (sequencing and deletion/duplication); EGFR, EGLN1, HOXB13, KIT, MITF, PDGFRA, POLD1 and POLE (sequencing only); EPCAM and GREM1 (deletion/duplication only). RNA data is routinely analyzed for use in variant interpretation for all genes.  (2) neoadjuvant chemotherapy consisting of cyclophosphamide and doxorubicin in dose dense fashion x4 started 09/18/2020, completed 11/15/2020, followed by weekly paclitaxel x12 started 12/07/2020  (a) echo 09/11/2020 shows an ejection fraction in the 60-65% range  (b) CA cycles 3 and 4 were each delayed by 1 week given multiple side effects  (c) start of weekly paclitaxel also delayed 1 week for the same reason  (3) definitive surgery to follow: Patient is considering bilateral mastectomies without reconstruction  (4) adjuvant radiation  (5) adjuvant antiestrogens   PLAN: Marlisha is doing quite well today.  Her labs are stable, and she continues on weekly Paclitaxel with good  tolerance.  She will proceed with treatment today.  I updated her schedule request and sent her chemotherapy orders to Dr. Jana Hakim to sign.    Laruth has no peripheral neuropathy.  We are monitoring her closely for this and I reviewed with her, with the last four cycles we will see her weekly.  When I see her back on 9/1 we will order her post neoadjuvant chemo MRI.  Bijal will continue to return weekly for labs and Paclitaxel and every other week for visits with myself or Dr. Jana Hakim.  She knows to call for any questions that may arise between now and her next appointment.  We are happy to see her sooner if needed.  Total encounter time 20 minutes in chart review, lab review, face to face visit time, care coordination, and documentation of the encounter.   Wilber Bihari, NP 02/08/21 7:17 AM Medical Oncology and Hematology [facility] [place], [place] [address] Tel. [phone]    Fax. [phone]  *Total Encounter Time as defined by the Centers for Medicare and Medicaid Services includes, in addition to the face-to-face time of a patient visit (documented in the note above) non-face-to-face time: obtaining and reviewing outside history, ordering and reviewing medications, tests or procedures, care coordination (communications with other health care professionals or caregivers) and documentation in the medical record.

## 2021-02-08 ENCOUNTER — Encounter: Payer: Self-pay | Admitting: Oncology

## 2021-02-13 ENCOUNTER — Encounter: Payer: Self-pay | Admitting: *Deleted

## 2021-02-14 ENCOUNTER — Other Ambulatory Visit: Payer: Self-pay

## 2021-02-14 ENCOUNTER — Inpatient Hospital Stay: Payer: No Typology Code available for payment source

## 2021-02-14 VITALS — BP 114/50 | HR 74 | Temp 98.8°F | Resp 18 | Wt 141.1 lb

## 2021-02-14 DIAGNOSIS — Z5111 Encounter for antineoplastic chemotherapy: Secondary | ICD-10-CM | POA: Diagnosis not present

## 2021-02-14 DIAGNOSIS — C50411 Malignant neoplasm of upper-outer quadrant of right female breast: Secondary | ICD-10-CM

## 2021-02-14 DIAGNOSIS — Z170 Estrogen receptor positive status [ER+]: Secondary | ICD-10-CM

## 2021-02-14 DIAGNOSIS — Z95828 Presence of other vascular implants and grafts: Secondary | ICD-10-CM

## 2021-02-14 LAB — CBC WITH DIFFERENTIAL/PLATELET
Abs Immature Granulocytes: 0 10*3/uL (ref 0.00–0.07)
Basophils Absolute: 0.1 10*3/uL (ref 0.0–0.1)
Basophils Relative: 2 %
Eosinophils Absolute: 0.1 10*3/uL (ref 0.0–0.5)
Eosinophils Relative: 3 %
HCT: 29.5 % — ABNORMAL LOW (ref 36.0–46.0)
Hemoglobin: 9.8 g/dL — ABNORMAL LOW (ref 12.0–15.0)
Immature Granulocytes: 0 %
Lymphocytes Relative: 28 %
Lymphs Abs: 0.6 10*3/uL — ABNORMAL LOW (ref 0.7–4.0)
MCH: 32.8 pg (ref 26.0–34.0)
MCHC: 33.2 g/dL (ref 30.0–36.0)
MCV: 98.7 fL (ref 80.0–100.0)
Monocytes Absolute: 0.3 10*3/uL (ref 0.1–1.0)
Monocytes Relative: 13 %
Neutro Abs: 1.1 10*3/uL — ABNORMAL LOW (ref 1.7–7.7)
Neutrophils Relative %: 54 %
Platelets: 244 10*3/uL (ref 150–400)
RBC: 2.99 MIL/uL — ABNORMAL LOW (ref 3.87–5.11)
RDW: 13 % (ref 11.5–15.5)
WBC: 2.1 10*3/uL — ABNORMAL LOW (ref 4.0–10.5)
nRBC: 0 % (ref 0.0–0.2)

## 2021-02-14 LAB — COMPREHENSIVE METABOLIC PANEL
ALT: 17 U/L (ref 0–44)
AST: 15 U/L (ref 15–41)
Albumin: 3.5 g/dL (ref 3.5–5.0)
Alkaline Phosphatase: 66 U/L (ref 38–126)
Anion gap: 7 (ref 5–15)
BUN: 13 mg/dL (ref 6–20)
CO2: 25 mmol/L (ref 22–32)
Calcium: 9.1 mg/dL (ref 8.9–10.3)
Chloride: 108 mmol/L (ref 98–111)
Creatinine, Ser: 0.76 mg/dL (ref 0.44–1.00)
GFR, Estimated: >60 mL/min (ref >60)
Glucose, Bld: 98 mg/dL (ref 70–99)
Potassium: 3.6 mmol/L (ref 3.5–5.1)
Sodium: 140 mmol/L (ref 135–145)
Total Bilirubin: 0.4 mg/dL (ref 0.3–1.2)
Total Protein: 6.4 g/dL — ABNORMAL LOW (ref 6.5–8.1)

## 2021-02-14 MED ORDER — SODIUM CHLORIDE 0.9 % IV SOLN
70.0000 mg/m2 | Freq: Once | INTRAVENOUS | Status: AC
  Administered 2021-02-14: 120 mg via INTRAVENOUS
  Filled 2021-02-14: qty 20

## 2021-02-14 MED ORDER — DEXAMETHASONE SODIUM PHOSPHATE 10 MG/ML IJ SOLN
4.0000 mg | Freq: Once | INTRAMUSCULAR | Status: AC
  Administered 2021-02-14: 4 mg via INTRAVENOUS
  Filled 2021-02-14: qty 1

## 2021-02-14 MED ORDER — SODIUM CHLORIDE 0.9% FLUSH
10.0000 mL | INTRAVENOUS | Status: DC | PRN
  Administered 2021-02-14: 10 mL

## 2021-02-14 MED ORDER — HEPARIN SOD (PORK) LOCK FLUSH 100 UNIT/ML IV SOLN
500.0000 [IU] | Freq: Once | INTRAVENOUS | Status: AC | PRN
Start: 2021-02-14 — End: 2021-02-14
  Administered 2021-02-14: 500 [IU]

## 2021-02-14 MED ORDER — DIPHENHYDRAMINE HCL 50 MG/ML IJ SOLN
25.0000 mg | Freq: Once | INTRAMUSCULAR | Status: AC
  Administered 2021-02-14: 25 mg via INTRAVENOUS
  Filled 2021-02-14: qty 1

## 2021-02-14 MED ORDER — SODIUM CHLORIDE 0.9% FLUSH
10.0000 mL | Freq: Once | INTRAVENOUS | Status: AC
  Administered 2021-02-14: 10 mL

## 2021-02-14 MED ORDER — FAMOTIDINE 20 MG IN NS 100 ML IVPB
20.0000 mg | Freq: Once | INTRAVENOUS | Status: AC
  Administered 2021-02-14: 20 mg via INTRAVENOUS
  Filled 2021-02-14: qty 100

## 2021-02-14 MED ORDER — SODIUM CHLORIDE 0.9 % IV SOLN: Freq: Once | INTRAVENOUS | Status: AC

## 2021-02-14 NOTE — Progress Notes (Signed)
Anc 1.1, wbc 2.1. Dr. Jana Hakim made aware, came to speak with patient, okay to proceed with tx as scheduled.

## 2021-02-14 NOTE — Patient Instructions (Signed)
[facility] ONCOLOGY  Discharge Instructions: Thank you for choosing [facility] to provide your oncology and hematology care.   If you have a lab appointment with the [facility], please go directly to the [facility] and check in at the registration area.   Wear comfortable clothing and clothing appropriate for easy access to any Portacath or PICC line.   We strive to give you quality time with your provider. You may need to reschedule your appointment if you arrive late (15 or more minutes).  Arriving late affects you and other patients whose appointments are after yours.  Also, if you miss three or more appointments without notifying the office, you may be dismissed from the clinic at the provider's discretion.      For prescription refill requests, have your pharmacy contact our office and allow 72 hours for refills to be completed.    Today you received the following chemotherapy and/or immunotherapy agents : Taxol     To help prevent nausea and vomiting after your treatment, we encourage you to take your nausea medication as directed.  BELOW ARE SYMPTOMS THAT SHOULD BE REPORTED IMMEDIATELY: *FEVER GREATER THAN 100.4 F (38 C) OR HIGHER *CHILLS OR SWEATING *NAUSEA AND VOMITING THAT IS NOT CONTROLLED WITH YOUR NAUSEA MEDICATION *UNUSUAL SHORTNESS OF BREATH *UNUSUAL BRUISING OR BLEEDING *URINARY PROBLEMS (pain or burning when urinating, or frequent urination) *BOWEL PROBLEMS (unusual diarrhea, constipation, pain near the anus) TENDERNESS IN MOUTH AND THROAT WITH OR WITHOUT PRESENCE OF ULCERS (sore throat, sores in mouth, or a toothache) UNUSUAL RASH, SWELLING OR PAIN  UNUSUAL VAGINAL DISCHARGE OR ITCHING   Items with * indicate a potential emergency and should be followed up as soon as possible or go to the Emergency Department if any problems should occur.  Please show the CHEMOTHERAPY ALERT CARD or IMMUNOTHERAPY ALERT CARD at check-in to the  Emergency Department and triage nurse.  Should you have questions after your visit or need to cancel or reschedule your appointment, please contact [facility]  Dept: [phone]  and follow the prompts.  Office hours are 8:00 a.m. to 4:30 p.m. Monday - Friday. Please note that voicemails left after 4:00 p.m. may not be returned until the following business day.  We are closed weekends and major holidays. You have access to a nurse at all times for urgent questions. Please call the main number to the clinic Dept: [phone] and follow the prompts.   For any non-urgent questions, you may also contact your provider using MyChart. We now offer e-Visits for anyone 77 and older to request care online for non-urgent symptoms. For details visit [email or web address].   Also download the MyChart app! Go to the app store, search "MyChart", open the app, select [facility], and log in with your MyChart username and password.  Due to Covid, a mask is required upon entering the hospital/clinic. If you do not have a mask, one will be given to you upon arrival. For doctor visits, patients may have 1 support person aged 32 or older with them. For treatment visits, patients cannot have anyone with them due to current Covid guidelines and our immunocompromised population.

## 2021-02-21 ENCOUNTER — Inpatient Hospital Stay (HOSPITAL_BASED_OUTPATIENT_CLINIC_OR_DEPARTMENT_OTHER): Payer: No Typology Code available for payment source | Admitting: Adult Health

## 2021-02-21 ENCOUNTER — Inpatient Hospital Stay: Payer: No Typology Code available for payment source | Attending: Adult Health

## 2021-02-21 ENCOUNTER — Encounter: Payer: Self-pay | Admitting: Adult Health

## 2021-02-21 ENCOUNTER — Inpatient Hospital Stay: Payer: No Typology Code available for payment source

## 2021-02-21 ENCOUNTER — Other Ambulatory Visit: Payer: Self-pay

## 2021-02-21 VITALS — BP 108/67 | HR 79 | Temp 97.5°F | Resp 18 | Ht 68.0 in | Wt 140.4 lb

## 2021-02-21 DIAGNOSIS — Z95828 Presence of other vascular implants and grafts: Secondary | ICD-10-CM

## 2021-02-21 DIAGNOSIS — D709 Neutropenia, unspecified: Secondary | ICD-10-CM | POA: Diagnosis not present

## 2021-02-21 DIAGNOSIS — Z170 Estrogen receptor positive status [ER+]: Secondary | ICD-10-CM | POA: Diagnosis not present

## 2021-02-21 DIAGNOSIS — C50411 Malignant neoplasm of upper-outer quadrant of right female breast: Secondary | ICD-10-CM | POA: Insufficient documentation

## 2021-02-21 DIAGNOSIS — Z5111 Encounter for antineoplastic chemotherapy: Secondary | ICD-10-CM | POA: Insufficient documentation

## 2021-02-21 LAB — COMPREHENSIVE METABOLIC PANEL
ALT: 14 U/L (ref 0–44)
AST: 17 U/L (ref 15–41)
Albumin: 3.6 g/dL (ref 3.5–5.0)
Alkaline Phosphatase: 65 U/L (ref 38–126)
Anion gap: 7 (ref 5–15)
BUN: 11 mg/dL (ref 6–20)
CO2: 25 mmol/L (ref 22–32)
Calcium: 9.2 mg/dL (ref 8.9–10.3)
Chloride: 110 mmol/L (ref 98–111)
Creatinine, Ser: 0.76 mg/dL (ref 0.44–1.00)
GFR, Estimated: >60 mL/min (ref >60)
Glucose, Bld: 94 mg/dL (ref 70–99)
Potassium: 3.9 mmol/L (ref 3.5–5.1)
Sodium: 142 mmol/L (ref 135–145)
Total Bilirubin: 0.3 mg/dL (ref 0.3–1.2)
Total Protein: 6.6 g/dL (ref 6.5–8.1)

## 2021-02-21 LAB — CBC WITH DIFFERENTIAL/PLATELET
Abs Immature Granulocytes: 0.01 10*3/uL (ref 0.00–0.07)
Basophils Absolute: 0 10*3/uL (ref 0.0–0.1)
Basophils Relative: 2 %
Eosinophils Absolute: 0.1 10*3/uL (ref 0.0–0.5)
Eosinophils Relative: 3 %
HCT: 29.3 % — ABNORMAL LOW (ref 36.0–46.0)
Hemoglobin: 9.9 g/dL — ABNORMAL LOW (ref 12.0–15.0)
Immature Granulocytes: 1 %
Lymphocytes Relative: 29 %
Lymphs Abs: 0.5 10*3/uL — ABNORMAL LOW (ref 0.7–4.0)
MCH: 32.7 pg (ref 26.0–34.0)
MCHC: 33.8 g/dL (ref 30.0–36.0)
MCV: 96.7 fL (ref 80.0–100.0)
Monocytes Absolute: 0.3 10*3/uL (ref 0.1–1.0)
Monocytes Relative: 15 %
Neutro Abs: 0.9 10*3/uL — ABNORMAL LOW (ref 1.7–7.7)
Neutrophils Relative %: 50 %
Platelets: 251 10*3/uL (ref 150–400)
RBC: 3.03 MIL/uL — ABNORMAL LOW (ref 3.87–5.11)
RDW: 13.1 % (ref 11.5–15.5)
WBC: 1.8 10*3/uL — ABNORMAL LOW (ref 4.0–10.5)
nRBC: 0 % (ref 0.0–0.2)

## 2021-02-21 MED ORDER — SODIUM CHLORIDE 0.9% FLUSH
10.0000 mL | Freq: Once | INTRAVENOUS | Status: AC
  Administered 2021-02-21: 10 mL

## 2021-02-21 NOTE — Progress Notes (Deleted)
Wbc 1.8, anc 0.9 tx to be held today per Melissa Bihari, NP

## 2021-02-21 NOTE — Progress Notes (Signed)
[facility]  [phone] [phone]     ID: Melissa Mcgrath DOB: Aug 31, 1960  MR#: [id number]  QPY#:[id number]  Patient Care Team: Doree Albee, MD (Inactive) as PCP - General (Internal Medicine) Mauro Kaufmann, RN as Oncology Nurse Navigator Rockwell Germany, RN as Oncology Nurse Navigator Magrinat, Virgie Dad, MD as Consulting Physician (Oncology) Stark Klein, MD as Consulting Physician (General Surgery) Gery Pray, MD as Consulting Physician (Radiation Oncology) Dian Queen, MD as Consulting Physician (Obstetrics and Gynecology) Ulla Gallo, MD as Consulting Physician (Dermatology) Scot Dock, NP OTHER MD:   CHIEF COMPLAINT: Estrogen receptor positive breast cancer  CURRENT TREATMENT: Neoadjuvant chemotherapy   INTERVAL HISTORY: Melissa Mcgrath returns today for follow up and treatment of her estrogen receptor positive breast cancer.  She completed her 4 cycles of cyclophosphamide and doxorubicin 11/15/2020 and received an extra week to recover since those treatments were really rough on her.    She is currently receiving weekly Paclitaxel.  Today is week 9 of 12 doses planned. She is neutropenic today with an ANC of 0.9.  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  She notes that she doesn't have tingling, but that her fingers very slightly feel different.  She notes that the feeling started the day following chemotherapy, and improved the next day.    Melissa Mcgrath is tired.  She had a recent death in the family, and that has caused some increased stress.  Her appetite has been decreased this past week and she says she felt like her WBC may be low today.      REVIEW OF SYSTEMS: Review of Systems  Constitutional:  Positive for appetite change and fatigue. Negative for chills, fever and unexpected weight change.  HENT:   Negative for hearing loss, lump/mass, mouth sores and  trouble swallowing.   Eyes:  Negative for eye problems and icterus.  Respiratory:  Negative for chest tightness, cough and shortness of breath.   Cardiovascular:  Negative for chest pain, leg swelling and palpitations.  Gastrointestinal:  Negative for abdominal distention, abdominal pain, constipation, diarrhea, nausea and vomiting.  Endocrine: Negative for hot flashes.  Genitourinary:  Negative for difficulty urinating.   Musculoskeletal:  Negative for arthralgias and back pain.  Skin:  Negative for itching and rash.  Neurological:  Negative for dizziness, extremity weakness, headaches and numbness.  Hematological:  Negative for adenopathy. Does not bruise/bleed easily.  Psychiatric/Behavioral:  Negative for depression. The patient is not nervous/anxious.      COVID 19 VACCINATION STATUS: [place] x2, most recently 01/2020, no booster as of 08/29/2020   HISTORY OF CURRENT ILLNESS: From the original intake note:  Melissa Mcgrath herself palpated an upper-outer right breast lump and noted associated intermittent pain and itching. She underwent bilateral diagnostic mammography with tomography and right breast ultrasonography at [facility] on 08/20/2020 showing: breast density category B; palpable 2.1 cm irregular mass in right breast at 9 o'clock; three abnormal-appearing right axillary tail nodes; indeterminate 4 mm oval mass located 1.8 cm lateral to dominant mass.  Accordingly on 08/21/2020 she proceeded to biopsy of the right breast area in question. The pathology from this procedure ([id number]) showed: invasive mammary carcinoma, e-cadherin positive, grade 3. Prognostic indicators significant for: estrogen receptor, 90% positive with strong staining intensity and progesterone receptor, 2% positive with weak staining intensity. Proliferation marker Ki67 at 40%. HER2 equivocal by immunohistochemistry (2+), but negative by fluorescent in situ hybridization (signals ratio  and number per cell not  available today).  The biopsied lymph node in the right axillary tail also showed invasive ductal carcinoma. No lymph node tissue was identified, possibly representing an entirely replaced lymph node.  Cancer Staging Malignant neoplasm of upper-outer quadrant of right breast in female, estrogen receptor positive ([place]) Staging form: Breast, AJCC 8th Edition - Clinical stage from 08/29/2020: Stage IIIA (cT2, cN2, cM0, G3, ER+, PR+, HER2-) - Signed by Gardenia Phlegm, NP on 09/07/2020 Stage prefix: Initial diagnosis Stage used in treatment planning: Yes National guidelines used in treatment planning: Yes Type of national guideline used in treatment planning: NCCN  The patient's subsequent history is as detailed below.   PAST MEDICAL HISTORY: Past Medical History:  Diagnosis Date   Depression 01/05/2014   Facial basal cell cancer    Family history of breast cancer    Family history of melanoma    Family history of ovarian cancer    Family history of pancreatic cancer    Menopause 01/05/2014   Vaginal atrophy 11/09/2014    PAST SURGICAL HISTORY: Past Surgical History:  Procedure Laterality Date   BREAST ENHANCEMENT SURGERY     CARPAL TUNNEL RELEASE Right    PORTACATH PLACEMENT N/A 09/17/2020   Procedure: INSERTION PORT-A-CATH;  Surgeon: Stark Klein, MD;  Location: [facility];  Service: General;  Laterality: N/A;   refractive lensectomy Bilateral     FAMILY HISTORY: Family History  Problem Relation Age of Onset   Breast cancer Mother 18       breast   Hypertension Father    Heart attack Father    Alzheimer's disease Father    Fibromyalgia Sister    Diabetes Sister    Heart attack Sister    Hypertension Sister    Hypertension Brother    Diabetes Brother    Pancreatic cancer Maternal Grandmother 83       Pancreatic   Melanoma Paternal Uncle        dx 75s   Melanoma Paternal Uncle        dx 17s   Ovarian cancer Paternal Aunt        dx 50s/60s   Her father died at age 49 from Alzheimer's. Her mother died at age 19 from metastatic breast cancer. She was initially diagnosed with breast cancer at age 63. Melissa Mcgrath has two brothers and one sister. In addition to her mother, she reports cancer in a paternal aunt (ovarian in mid 5's) and in her maternal grandmother (pancreatic at age 29).    GYNECOLOGIC HISTORY:  No LMP recorded. Patient is postmenopausal. Menarche: 60 years old Age at first live birth: 60 years old [place] P 2 LMP 2010 Contraceptive: used for approx. 20 years HRT used from 11/2019 until diagnosis  Hysterectomy? no BSO? 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 Husband Melissa [place] "Fritz Pickerel" is retired from working for [employer]. For exercise, she walks daily, rides horses every other week, and does farm work daily. Daughter Melissa Mcgrath, age 49, is a horse trainer/instructor. Daughter Melissa Mcgrath, age 33, is a Copywriter, advertising in [place]. Jecenia has one grandchild. She attends [employer].    ADVANCED DIRECTIVES: In the absence of any documentation to the contrary, the patient's spouse is their HCPOA.    HEALTH MAINTENANCE: Social History   Tobacco Use   Smoking status: Never   Smokeless tobacco: Never  Vaping Use   Vaping Use: Never used  Substance Use Topics  Alcohol use: No   Drug use: No     Colonoscopy: never done  PAP: 01/2020  Bone density: never done   No Known Allergies  Current Outpatient Medications  Medication Sig Dispense Refill   docusate sodium (COLACE) 100 MG capsule TAKE 2 CAPSULES BY MOUTH TWICE DAILY AS NEEDED FOR CONSTIPATION 60 capsule 2   ibuprofen (ADVIL) 200 MG tablet Take 400 mg by mouth 3 (three) times daily as needed.     lidocaine-prilocaine (EMLA) cream Apply 1 application topically as needed. 30 g 1   loratadine (CLARITIN) 10 MG tablet Take 1 tablet (10 mg total) by mouth daily. 90 tablet 0   LORazepam (ATIVAN) 0.5 MG tablet Take 1 tablet (0.5 mg  total) by mouth at bedtime as needed (Nausea or vomiting). 20 tablet 0   magic mouthwash SOLN 5 ml swish and spit QID prn, oral tenderness 240 mL 2   Multiple Vitamin (MULTIVITAMIN) tablet Take 1 tablet by mouth daily.     ondansetron (ZOFRAN) 8 MG tablet Take 1 tablet (8 mg total) by mouth every 8 (eight) hours as needed for nausea or vomiting. Start day 3 after chemotherapy. 20 tablet 0   polyethylene glycol powder (MIRALAX) 17 GM/SCOOP powder Take 17 g by mouth daily as needed. 255 g 0   prochlorperazine (COMPAZINE) 10 MG tablet Take 0.5 tablets (5 mg total) by mouth every 6 (six) hours as needed (Nausea or vomiting). 30 tablet 1   triamcinolone lotion (KENALOG) 0.1 % Apply 1 application topically 3 (three) times daily. 120 mL 3   valACYclovir (VALTREX) 1000 MG tablet Take 1 tablet (1,000 mg total) by mouth daily. 30 tablet 0   No current facility-administered medications for this visit.    OBJECTIVE: White woman in no acute distress  Vitals:   02/21/21 0848  BP: 108/67  Pulse: 79  Resp: 18  Temp: (!) 97.5 F (36.4 C)  SpO2: 100%       Body mass index is 21.35 kg/m.   Wt Readings from Last 3 Encounters:  02/21/21 140 lb 6.4 oz (63.7 kg)  02/14/21 141 lb 1.5 oz (64 kg)  02/07/21 142 lb 6.4 oz (64.6 kg)     ECOG FS:1 - Symptomatic but completely ambulatory GENERAL: Patient is a well appearing female in no acute distress HEENT:  Sclerae anicteric.  Oropharynx clear and moist. No ulcerations or evidence of oropharyngeal candidiasis. Neck is supple.  NODES:  No cervical, supraclavicular, or axillary lymphadenopathy palpated.  BREAST EXAM:  no sign of progression LUNGS:  Clear to auscultation bilaterally.  No wheezes or rhonchi. HEART:  Regular rate and rhythm. No murmur appreciated. ABDOMEN:  Soft, nontender.  Positive, normoactive bowel sounds. No organomegaly palpated. MSK:  No focal spinal tenderness to palpation. Full range of motion bilaterally in the upper  extremities. EXTREMITIES:  No peripheral edema.   SKIN:  Clear with no obvious rashes or skin changes. No nail dyscrasia. NEURO:  Nonfocal. Well oriented.  Appropriate affect.    LAB RESULTS:  CMP     Component Value Date/Time   NA 140 02/14/2021 0842   K 3.6 02/14/2021 0842   CL 108 02/14/2021 0842   CO2 25 02/14/2021 0842   GLUCOSE 98 02/14/2021 0842   BUN 13 02/14/2021 0842   CREATININE 0.76 02/14/2021 0842   CREATININE 0.68 01/10/2021 0837   CREATININE 0.76 12/20/2013 1207   CALCIUM 9.1 02/14/2021 0842   PROT 6.4 (L) 02/14/2021 0842   ALBUMIN 3.5 02/14/2021 6629  AST 15 02/14/2021 0842   AST 23 01/10/2021 0837   ALT 17 02/14/2021 0842   ALT 21 01/10/2021 0837   ALKPHOS 66 02/14/2021 0842   BILITOT 0.4 02/14/2021 0842   BILITOT 0.5 01/10/2021 0837   GFRNONAA >60 02/14/2021 0842   GFRNONAA >60 01/10/2021 0837    No results found for: TOTALPROTELP, ALBUMINELP, A1GS, A2GS, BETS, BETA2SER, GAMS, MSPIKE, SPEI  Lab Results  Component Value Date   WBC 1.8 (L) 02/21/2021   NEUTROABS PENDING 02/21/2021   HGB 9.9 (L) 02/21/2021   HCT 29.3 (L) 02/21/2021   MCV 96.7 02/21/2021   PLT 251 02/21/2021    No results found for: LABCA2  No components found for: ELFYBO175  No results for input(s): INR in the last 168 hours.  No results found for: LABCA2  No results found for: ZWC585  No results found for: IDP824  No results found for: MPN361  No results found for: CA2729  No components found for: HGQUANT  No results found for: CEA1 / No results found for: CEA1   No results found for: AFPTUMOR  No results found for: CHROMOGRNA  No results found for: KPAFRELGTCHN, LAMBDASER, KAPLAMBRATIO (kappa/lambda light chains)  No results found for: HGBA, HGBA2QUANT, HGBFQUANT, HGBSQUAN (Hemoglobinopathy evaluation)   No results found for: LDH  No results found for: IRON, TIBC, IRONPCTSAT (Iron and TIBC)  No results found for: FERRITIN  Urinalysis No results  found for: COLORURINE, APPEARANCEUR, LABSPEC, PHURINE, GLUCOSEU, HGBUR, BILIRUBINUR, KETONESUR, PROTEINUR, UROBILINOGEN, NITRITE, LEUKOCYTESUR   STUDIES: No results found.   ELIGIBLE FOR AVAILABLE RESEARCH PROTOCOL: no  ASSESSMENT: 60 y.o. [place] woman status post right breast upper outer quadrant biopsy 08/21/2020 for a clinical mT2 N1, stage IIA/B invasive ductal carcinoma, grade 3, estrogen receptor positive, progesterone receptor weakly positive, HER-2 not amplified, with an MIB-1 of 40%  (a) breast MRI 09/05/2020 shows a clinical T2 N2, stage IIIA disease  (b) bone scan and chest CT scan 09/14/2020 show no evidence of metastatic disease  (1) genetics testing 09/18/2020 through the  [facility]Next-Expanded + RNAinsight panel found no deleterious mutations in AIP, ALK, APC, ATM, AXIN2, BAP1, BARD1, BLM, BMPR1A, BRCA1, BRCA2, BRIP1, CDC73, CDH1, CDK4, CDKN1B, CDKN2A, CHEK2, CTNNA1, DICER1, FANCC, FH, FLCN, GALNT12, KIF1B, LZTR1, MAX, MEN1, MET, MLH1, MSH2, MSH3, MSH6, MUTYH, NBN, NF1, NF2, NTHL1, PALB2, PHOX2B, PMS2, POT1, PRKAR1A, PTCH1, PTEN, RAD51C, RAD51D, RB1, RECQL, RET, SDHA, SDHAF2, SDHB, SDHC, SDHD, SMAD4, SMARCA4, SMARCB1, SMARCE1, STK11, SUFU, TMEM127, TP53, TSC1, TSC2, VHL and XRCC2 (sequencing and deletion/duplication); EGFR, EGLN1, HOXB13, KIT, MITF, PDGFRA, POLD1 and POLE (sequencing only); EPCAM and GREM1 (deletion/duplication only). RNA data is routinely analyzed for use in variant interpretation for all genes.  (2) neoadjuvant chemotherapy consisting of cyclophosphamide and doxorubicin in dose dense fashion x4 started 09/18/2020, completed 11/15/2020, followed by weekly paclitaxel x12 started 12/07/2020  (a) echo 09/11/2020 shows an ejection fraction in the 60-65% range  (b) CA cycles 3 and 4 were each delayed by 1 week given multiple side effects  (c) start of weekly paclitaxel also delayed 1 week for the same reason  (3) definitive surgery to follow: Patient is  considering bilateral mastectomies without reconstruction  (4) adjuvant radiation  (5) adjuvant antiestrogens   PLAN: Melissa Mcgrath continues on her neoadjuvant chemotherapy with weekly Paclitaxel.  Her ANC is too low today, and she cannot receive treatment.  She will receive Neupogen or biosimilar on 02/26/2021 and we will see her back two days later to evaluate her for treatment.  Melissa Mcgrath's numbness could very well be due to her history of carpal tunnel.  We will monitor this very closely and should it worsen or recur we will need to stop treatment.  She understands this.  I went ahead and placed orders for her f/u MRI.  I also sent our breast navigators a message to get her f/u scheduled with Dr. Barry Dienes to discuss surgery.    Melissa Mcgrath and I reviewed the above in detail.  I got information about her husband so I could check with a PCP group at [facility] who may be a good fit for Melissa Mcgrath.  I will let them know once I hear back.  Melissa Mcgrath will return on Tuesday for an injection, and Thursday (in one week) for labs, f/u, and her next treatment.  She knows to call for any questions that may arise between now and her next appointment.  We are happy to see her sooner if needed.   Total encounter time 30 minutes in chart review, lab review, face to face visit time, care coordination, and documentation of the encounter.   Wilber Bihari, NP 02/21/21 9:29 AM Medical Oncology and Hematology [facility] [place], [place] [address] Tel. [phone]    Fax. [phone]  *Total Encounter Time as defined by the Centers for Medicare and Medicaid Services includes, in addition to the face-to-face time of a patient visit (documented in the note above) non-face-to-face time: obtaining and reviewing outside history, ordering and reviewing medications, tests or procedures, care coordination (communications with other health care professionals or caregivers) and documentation in the medical  record.

## 2021-02-22 ENCOUNTER — Telehealth: Payer: Self-pay | Admitting: Adult Health

## 2021-02-26 ENCOUNTER — Inpatient Hospital Stay: Payer: No Typology Code available for payment source

## 2021-02-26 ENCOUNTER — Other Ambulatory Visit: Payer: Self-pay

## 2021-02-26 VITALS — BP 119/66 | HR 86 | Temp 98.4°F | Resp 18

## 2021-02-26 DIAGNOSIS — Z5111 Encounter for antineoplastic chemotherapy: Secondary | ICD-10-CM | POA: Diagnosis not present

## 2021-02-26 DIAGNOSIS — Z95828 Presence of other vascular implants and grafts: Secondary | ICD-10-CM

## 2021-02-26 MED ORDER — FILGRASTIM-AAFI 300 MCG/0.5ML IJ SOSY
300.0000 ug | PREFILLED_SYRINGE | Freq: Once | INTRAMUSCULAR | Status: AC
  Administered 2021-02-26: 300 ug via SUBCUTANEOUS
  Filled 2021-02-26: qty 0.5

## 2021-02-26 NOTE — Patient Instructions (Signed)
Filgrastim, G-CSF injection What is this medication? FILGRASTIM, G-CSF (fil GRA stim) is a granulocyte colony-stimulating factor that stimulates the growth of neutrophils, a type of white blood cell (WBC) important in the body's fight against infection. It is used to reduce the incidence of fever and infection in patients with certain types of cancer who are receiving chemotherapy that affects the bone marrow, to stimulate blood cell production for removal of WBCs from the body prior to a bone marrow transplantation, to reduce the incidence of fever and infection in patients who have severe chronic neutropenia, and to improve survival outcomes following high-dose radiation exposure that is toxic to the bone marrow. This medicine may be used for other purposes; ask your health care provider or pharmacist if you have questions. COMMON BRAND NAME(S): Neupogen, Nivestym, Releuko, Zarxio What should I tell my care team before I take this medication? They need to know if you have any of these conditions: kidney disease latex allergy ongoing radiation therapy sickle cell disease an unusual or allergic reaction to filgrastim, pegfilgrastim, other medicines, foods, dyes, or preservatives pregnant or trying to get pregnant breast-feeding How should I use this medication? This medicine is for injection under the skin or infusion into a vein. As an infusion into a vein, it is usually given by a health care professional in a hospital or clinic setting. If you get this medicine at home, you will be taught how to prepare and give this medicine. Refer to the Instructions for Use that come with your medication packaging. Use exactly as directed. Take your medicine at regular intervals. Do not take your medicine more often than directed. It is important that you put your used needles and syringes in a special sharps container. Do not put them in a trash can. If you do not have a sharps container, call your pharmacist  or healthcare provider to get one. Talk to your pediatrician regarding the use of this medicine in children. While this drug may be prescribed for children as young as 7 months for selected conditions, precautions do apply. Overdosage: If you think you have taken too much of this medicine contact a poison control center or emergency room at once. NOTE: This medicine is only for you. Do not share this medicine with others. What if I miss a dose? It is important not to miss your dose. Call your doctor or health care professional if you miss a dose. What may interact with this medication? This medicine may interact with the following medications: medicines that may cause a release of neutrophils, such as lithium This list may not describe all possible interactions. Give your health care provider a list of all the medicines, herbs, non-prescription drugs, or dietary supplements you use. Also tell them if you smoke, drink alcohol, or use illegal drugs. Some items may interact with your medicine. What should I watch for while using this medication? Your condition will be monitored carefully while you are receiving this medicine. You may need blood work done while you are taking this medicine. Talk to your health care provider about your risk of cancer. You may be more at risk for certain types of cancer if you take this medicine. What side effects may I notice from receiving this medication? Side effects that you should report to your doctor or health care professional as soon as possible: allergic reactions like skin rash, itching or hives, swelling of the face, lips, or tongue back pain dizziness or feeling faint fever pain, redness, or   irritation at site where injected pinpoint red spots on the skin shortness of breath or breathing problems signs and symptoms of kidney injury like trouble passing urine, change in the amount of urine, or red or dark-brown urine stomach or side pain, or pain at  the shoulder swelling tiredness unusual bleeding or bruising Side effects that usually do not require medical attention (report to your doctor or health care professional if they continue or are bothersome): bone pain cough diarrhea hair loss headache muscle pain This list may not describe all possible side effects. Call your doctor for medical advice about side effects. You may report side effects to FDA at 1-800-FDA-1088. Where should I keep my medication? Keep out of the reach of children. Store in a refrigerator between 2 and 8 degrees C (36 and 46 degrees F). Do not freeze. Keep in carton to protect from light. Throw away this medicine if vials or syringes are left out of the refrigerator for more than 24 hours. Throw away any unused medicine after the expiration date. NOTE: This sheet is a summary. It may not cover all possible information. If you have questions about this medicine, talk to your doctor, pharmacist, or health care provider.  2022 Elsevier/Gold Standard (2019-06-30 18:47:55)  

## 2021-03-01 ENCOUNTER — Inpatient Hospital Stay (HOSPITAL_BASED_OUTPATIENT_CLINIC_OR_DEPARTMENT_OTHER): Payer: No Typology Code available for payment source | Admitting: Adult Health

## 2021-03-01 ENCOUNTER — Encounter: Payer: Self-pay | Admitting: Adult Health

## 2021-03-01 ENCOUNTER — Other Ambulatory Visit: Payer: Self-pay

## 2021-03-01 ENCOUNTER — Inpatient Hospital Stay: Payer: No Typology Code available for payment source

## 2021-03-01 ENCOUNTER — Encounter: Payer: Self-pay | Admitting: *Deleted

## 2021-03-01 VITALS — BP 123/62 | HR 92 | Temp 97.9°F | Resp 18 | Ht 68.0 in | Wt 142.0 lb

## 2021-03-01 DIAGNOSIS — C50411 Malignant neoplasm of upper-outer quadrant of right female breast: Secondary | ICD-10-CM | POA: Diagnosis not present

## 2021-03-01 DIAGNOSIS — Z170 Estrogen receptor positive status [ER+]: Secondary | ICD-10-CM

## 2021-03-01 DIAGNOSIS — Z5111 Encounter for antineoplastic chemotherapy: Secondary | ICD-10-CM | POA: Diagnosis not present

## 2021-03-01 DIAGNOSIS — Z95828 Presence of other vascular implants and grafts: Secondary | ICD-10-CM

## 2021-03-01 LAB — COMPREHENSIVE METABOLIC PANEL
ALT: 12 U/L (ref 0–44)
AST: 13 U/L — ABNORMAL LOW (ref 15–41)
Albumin: 3.6 g/dL (ref 3.5–5.0)
Alkaline Phosphatase: 76 U/L (ref 38–126)
Anion gap: 11 (ref 5–15)
BUN: 12 mg/dL (ref 6–20)
CO2: 22 mmol/L (ref 22–32)
Calcium: 9.2 mg/dL (ref 8.9–10.3)
Chloride: 108 mmol/L (ref 98–111)
Creatinine, Ser: 0.85 mg/dL (ref 0.44–1.00)
GFR, Estimated: >60 mL/min (ref >60)
Glucose, Bld: 98 mg/dL (ref 70–99)
Potassium: 3.8 mmol/L (ref 3.5–5.1)
Sodium: 141 mmol/L (ref 135–145)
Total Bilirubin: 0.3 mg/dL (ref 0.3–1.2)
Total Protein: 6.6 g/dL (ref 6.5–8.1)

## 2021-03-01 LAB — CBC WITH DIFFERENTIAL/PLATELET
Abs Immature Granulocytes: 0.28 10*3/uL — ABNORMAL HIGH (ref 0.00–0.07)
Basophils Absolute: 0.1 10*3/uL (ref 0.0–0.1)
Basophils Relative: 2 %
Eosinophils Absolute: 0.2 10*3/uL (ref 0.0–0.5)
Eosinophils Relative: 3 %
HCT: 31.5 % — ABNORMAL LOW (ref 36.0–46.0)
Hemoglobin: 10.7 g/dL — ABNORMAL LOW (ref 12.0–15.0)
Immature Granulocytes: 5 %
Lymphocytes Relative: 14 %
Lymphs Abs: 0.9 10*3/uL (ref 0.7–4.0)
MCH: 32.9 pg (ref 26.0–34.0)
MCHC: 34 g/dL (ref 30.0–36.0)
MCV: 96.9 fL (ref 80.0–100.0)
Monocytes Absolute: 0.9 10*3/uL (ref 0.1–1.0)
Monocytes Relative: 15 %
Neutro Abs: 3.7 10*3/uL (ref 1.7–7.7)
Neutrophils Relative %: 61 %
Platelets: 208 10*3/uL (ref 150–400)
RBC: 3.25 MIL/uL — ABNORMAL LOW (ref 3.87–5.11)
RDW: 13.5 % (ref 11.5–15.5)
WBC: 6 10*3/uL (ref 4.0–10.5)
nRBC: 0 % (ref 0.0–0.2)

## 2021-03-01 MED ORDER — DEXAMETHASONE SODIUM PHOSPHATE 10 MG/ML IJ SOLN
4.0000 mg | Freq: Once | INTRAMUSCULAR | Status: AC
  Administered 2021-03-01: 4 mg via INTRAVENOUS
  Filled 2021-03-01: qty 1

## 2021-03-01 MED ORDER — SODIUM CHLORIDE 0.9% FLUSH
10.0000 mL | Freq: Once | INTRAVENOUS | Status: AC
  Administered 2021-03-01: 10 mL

## 2021-03-01 MED ORDER — HEPARIN SOD (PORK) LOCK FLUSH 100 UNIT/ML IV SOLN
500.0000 [IU] | Freq: Once | INTRAVENOUS | Status: AC | PRN
  Administered 2021-03-01: 500 [IU]

## 2021-03-01 MED ORDER — SODIUM CHLORIDE 0.9% FLUSH
10.0000 mL | INTRAVENOUS | Status: DC | PRN
  Administered 2021-03-01: 10 mL

## 2021-03-01 MED ORDER — DIPHENHYDRAMINE HCL 50 MG/ML IJ SOLN
25.0000 mg | Freq: Once | INTRAMUSCULAR | Status: AC
  Administered 2021-03-01: 25 mg via INTRAVENOUS
  Filled 2021-03-01: qty 1

## 2021-03-01 MED ORDER — FAMOTIDINE 20 MG IN NS 100 ML IVPB
20.0000 mg | Freq: Once | INTRAVENOUS | Status: AC
  Administered 2021-03-01: 20 mg via INTRAVENOUS
  Filled 2021-03-01: qty 100

## 2021-03-01 MED ORDER — SODIUM CHLORIDE 0.9 % IV SOLN: Freq: Once | INTRAVENOUS | Status: AC

## 2021-03-01 MED ORDER — PACLITAXEL CHEMO INJECTION 300 MG/50ML
70.0000 mg/m2 | Freq: Once | INTRAVENOUS | Status: AC
  Administered 2021-03-01: 120 mg via INTRAVENOUS
  Filled 2021-03-01: qty 20

## 2021-03-01 NOTE — Patient Instructions (Signed)
[facility]   Discharge Instructions: Thank you for choosing [facility] to provide your oncology and hematology care.   If you have a lab appointment with the Cancer Center, please go directly to the Cancer Center and check in at the registration area.   Wear comfortable clothing and clothing appropriate for easy access to any Portacath or PICC line.   We strive to give you quality time with your provider. You may need to reschedule your appointment if you arrive late (15 or more minutes).  Arriving late affects you and other patients whose appointments are after yours.  Also, if you miss three or more appointments without notifying the office, you may be dismissed from the clinic at the provider's discretion.      For prescription refill requests, have your pharmacy contact our office and allow 72 hours for refills to be completed.    Today you received the following chemotherapy and/or immunotherapy agents: paclitaxel.      To help prevent nausea and vomiting after your treatment, we encourage you to take your nausea medication as directed.  BELOW ARE SYMPTOMS THAT SHOULD BE REPORTED IMMEDIATELY: *FEVER GREATER THAN 100.4 F (38 C) OR HIGHER *CHILLS OR SWEATING *NAUSEA AND VOMITING THAT IS NOT CONTROLLED WITH YOUR NAUSEA MEDICATION *UNUSUAL SHORTNESS OF BREATH *UNUSUAL BRUISING OR BLEEDING *URINARY PROBLEMS (pain or burning when urinating, or frequent urination) *BOWEL PROBLEMS (unusual diarrhea, constipation, pain near the anus) TENDERNESS IN MOUTH AND THROAT WITH OR WITHOUT PRESENCE OF ULCERS (sore throat, sores in mouth, or a toothache) UNUSUAL RASH, SWELLING OR PAIN  UNUSUAL VAGINAL DISCHARGE OR ITCHING   Items with * indicate a potential emergency and should be followed up as soon as possible or go to the Emergency Department if any problems should occur.  Please show the CHEMOTHERAPY ALERT CARD or IMMUNOTHERAPY ALERT CARD at check-in  to the Emergency Department and triage nurse.  Should you have questions after your visit or need to cancel or reschedule your appointment, please contact [facility]  Dept: [phone]  and follow the prompts.  Office hours are 8:00 a.m. to 4:30 p.m. Monday - Friday. Please note that voicemails left after 4:00 p.m. may not be returned until the following business day.  We are closed weekends and major holidays. You have access to a nurse at all times for urgent questions. Please call the main number to the clinic Dept: [phone] and follow the prompts.   For any non-urgent questions, you may also contact your provider using MyChart. We now offer e-Visits for anyone 18 and older to request care online for non-urgent symptoms. For details visit [email or web address].   Also download the MyChart app! Go to the app store, search "MyChart", open the app, select [facility], and log in with your MyChart username and password.  Due to Covid, a mask is required upon entering the hospital/clinic. If you do not have a mask, one will be given to you upon arrival. For doctor visits, patients may have 1 support person aged 18 or older with them. For treatment visits, patients cannot have anyone with them due to current Covid guidelines and our immunocompromised population.   

## 2021-03-01 NOTE — Progress Notes (Signed)
[facility]  [phone] [phone]     ID: Melissa Mcgrath DOB: 1960-08-05  MR#: [id number]  CLE#:[id number]  Patient Care Team: Doree Albee, MD (Inactive) as PCP - General (Internal Medicine) Mauro Kaufmann, RN as Oncology Nurse Navigator Rockwell Germany, RN as Oncology Nurse Navigator Magrinat, Virgie Dad, MD as Consulting Physician (Oncology) Stark Klein, MD as Consulting Physician (General Surgery) Gery Pray, MD as Consulting Physician (Radiation Oncology) Dian Queen, MD as Consulting Physician (Obstetrics and Gynecology) Ulla Gallo, MD as Consulting Physician (Dermatology) Scot Dock, NP OTHER MD:   CHIEF COMPLAINT: Estrogen receptor positive breast cancer  CURRENT TREATMENT: Neoadjuvant chemotherapy   INTERVAL HISTORY: Melissa Mcgrath returns today for follow up and treatment of her estrogen receptor positive breast cancer.  She completed her 4 cycles of cyclophosphamide and doxorubicin 11/15/2020 and received an extra week to recover since those treatments were really rough on her.    She is currently receiving weekly Paclitaxel.  Today is week 9 of 12 doses planned. We held her chemotherapy last week due to neutropenia.  She received Granix this past Tuesday and her WBC is improved.  She is mildly fatigued, and notes some mild intermittent neuropathy in her right first three digits.  She has a h/o carpal tunnel on that side.     REVIEW OF SYSTEMS: Review of Systems  Constitutional:  Positive for fatigue. Negative for appetite change, chills, fever and unexpected weight change.  HENT:   Negative for hearing loss, lump/mass, mouth sores and trouble swallowing.   Eyes:  Negative for eye problems and icterus.  Respiratory:  Negative for chest tightness, cough and shortness of breath.   Cardiovascular:  Negative for chest pain, leg swelling and palpitations.  Gastrointestinal:  Negative for abdominal distention,  abdominal pain, constipation, diarrhea, nausea and vomiting.  Endocrine: Negative for hot flashes.  Genitourinary:  Negative for difficulty urinating.   Musculoskeletal:  Negative for arthralgias and back pain.  Skin:  Negative for itching and rash.  Neurological:  Positive for numbness. Negative for dizziness, extremity weakness and headaches.  Hematological:  Negative for adenopathy. Does not bruise/bleed easily.  Psychiatric/Behavioral:  Negative for depression. The patient is not nervous/anxious.      COVID 19 VACCINATION STATUS: [place] x2, most recently 01/2020, no booster as of 08/29/2020   HISTORY OF CURRENT ILLNESS: From the original intake note:  Melissa Mcgrath herself palpated an upper-outer right breast lump and noted associated intermittent pain and itching. She underwent bilateral diagnostic mammography with tomography and right breast ultrasonography at [facility] on 08/20/2020 showing: breast density category B; palpable 2.1 cm irregular mass in right breast at 9 o'clock; three abnormal-appearing right axillary tail nodes; indeterminate 4 mm oval mass located 1.8 cm lateral to dominant mass.  Accordingly on 08/21/2020 she proceeded to biopsy of the right breast area in question. The pathology from this procedure ([id number]) showed: invasive mammary carcinoma, e-cadherin positive, grade 3. Prognostic indicators significant for: estrogen receptor, 90% positive with strong staining intensity and progesterone receptor, 2% positive with weak staining intensity. Proliferation marker Ki67 at 40%. HER2 equivocal by immunohistochemistry (2+), but negative by fluorescent in situ hybridization (signals ratio and number per cell not available today).  The biopsied lymph node in the right axillary tail also showed invasive ductal carcinoma. No lymph node tissue was identified, possibly representing an entirely replaced lymph node.  Cancer Staging Malignant neoplasm of upper-outer quadrant of right  breast in female, estrogen receptor positive ([place])  Staging form: Breast, AJCC 8th Edition - Clinical stage from 08/29/2020: Stage IIIA (cT2, cN2, cM0, G3, ER+, PR+, HER2-) - Signed by Gardenia Phlegm, NP on 09/07/2020 Stage prefix: Initial diagnosis Stage used in treatment planning: Yes National guidelines used in treatment planning: Yes Type of national guideline used in treatment planning: NCCN  The patient's subsequent history is as detailed below.   PAST MEDICAL HISTORY: Past Medical History:  Diagnosis Date   Depression 01/05/2014   Facial basal cell cancer    Family history of breast cancer    Family history of melanoma    Family history of ovarian cancer    Family history of pancreatic cancer    Menopause 01/05/2014   Vaginal atrophy 11/09/2014    PAST SURGICAL HISTORY: Past Surgical History:  Procedure Laterality Date   BREAST ENHANCEMENT SURGERY     CARPAL TUNNEL RELEASE Right    PORTACATH PLACEMENT N/A 09/17/2020   Procedure: INSERTION PORT-A-CATH;  Surgeon: Stark Klein, MD;  Location: [facility];  Service: General;  Laterality: N/A;   refractive lensectomy Bilateral     FAMILY HISTORY: Family History  Problem Relation Age of Onset   Breast cancer Mother 4       breast   Hypertension Father    Heart attack Father    Alzheimer's disease Father    Fibromyalgia Sister    Diabetes Sister    Heart attack Sister    Hypertension Sister    Hypertension Brother    Diabetes Brother    Pancreatic cancer Maternal Grandmother 83       Pancreatic   Melanoma Paternal Uncle        dx 39s   Melanoma Paternal Uncle        dx 55s   Ovarian cancer Paternal Aunt        dx 50s/60s  Her father died at age 69 from Alzheimer's. Her mother died at age 18 from metastatic breast cancer. She was initially diagnosed with breast cancer at age 48. Melissa Mcgrath has two brothers and one sister. In addition to her mother, she reports cancer in a paternal aunt (ovarian in  mid 73's) and in her maternal grandmother (pancreatic at age 66).    GYNECOLOGIC HISTORY:  No LMP recorded. Patient is postmenopausal. Menarche: 60 years old Age at first live birth: 60 years old [place] P 2 LMP 2010 Contraceptive: used for approx. 20 years HRT used from 11/2019 until diagnosis  Hysterectomy? no BSO? 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 Husband Herbie [place] "Fritz Pickerel" is retired from working for [employer]. For exercise, she walks daily, rides horses every other week, and does farm work daily. Daughter Cline Cools, age 4, is a horse trainer/instructor. Daughter Lavell Anchors, age 44, is a Copywriter, advertising in [place]. Vadis has one grandchild. She attends [employer].    ADVANCED DIRECTIVES: In the absence of any documentation to the contrary, the patient's spouse is their HCPOA.    HEALTH MAINTENANCE: Social History   Tobacco Use   Smoking status: Never   Smokeless tobacco: Never  Vaping Use   Vaping Use: Never used  Substance Use Topics   Alcohol use: No   Drug use: No     Colonoscopy: never done  PAP: 01/2020  Bone density: never done   No Known Allergies  Current Outpatient Medications  Medication Sig Dispense Refill   docusate sodium (COLACE) 100 MG capsule TAKE 2 CAPSULES BY MOUTH TWICE DAILY  AS NEEDED FOR CONSTIPATION 60 capsule 2   ibuprofen (ADVIL) 200 MG tablet Take 400 mg by mouth 3 (three) times daily as needed.     lidocaine-prilocaine (EMLA) cream Apply 1 application topically as needed. 30 g 1   loratadine (CLARITIN) 10 MG tablet Take 1 tablet (10 mg total) by mouth daily. 90 tablet 0   LORazepam (ATIVAN) 0.5 MG tablet Take 1 tablet (0.5 mg total) by mouth at bedtime as needed (Nausea or vomiting). 20 tablet 0   magic mouthwash SOLN 5 ml swish and spit QID prn, oral tenderness 240 mL 2   Multiple Vitamin (MULTIVITAMIN) tablet Take 1 tablet by mouth daily.     ondansetron (ZOFRAN) 8 MG tablet Take 1 tablet (8 mg  total) by mouth every 8 (eight) hours as needed for nausea or vomiting. Start day 3 after chemotherapy. 20 tablet 0   polyethylene glycol powder (MIRALAX) 17 GM/SCOOP powder Take 17 g by mouth daily as needed. 255 g 0   prochlorperazine (COMPAZINE) 10 MG tablet Take 0.5 tablets (5 mg total) by mouth every 6 (six) hours as needed (Nausea or vomiting). 30 tablet 1   triamcinolone lotion (KENALOG) 0.1 % Apply 1 application topically 3 (three) times daily. 120 mL 3   valACYclovir (VALTREX) 1000 MG tablet Take 1 tablet (1,000 mg total) by mouth daily. 30 tablet 0   No current facility-administered medications for this visit.    OBJECTIVE: White woman in no acute distress  Vitals:   03/01/21 0822  BP: 123/62  Pulse: 92  Resp: 18  Temp: 97.9 F (36.6 C)  SpO2: 100%       Body mass index is 21.59 kg/m.   Wt Readings from Last 3 Encounters:  03/01/21 142 lb (64.4 kg)  02/21/21 140 lb 6.4 oz (63.7 kg)  02/14/21 141 lb 1.5 oz (64 kg)     ECOG FS:1 - Symptomatic but completely ambulatory GENERAL: Patient is a well appearing female in no acute distress HEENT:  Sclerae anicteric.  Oropharynx clear and moist. No ulcerations or evidence of oropharyngeal candidiasis. Neck is supple.  NODES:  No cervical, supraclavicular, or axillary lymphadenopathy palpated.  BREAST EXAM:  no sign of progression LUNGS:  Clear to auscultation bilaterally.  No wheezes or rhonchi. HEART:  Regular rate and rhythm. No murmur appreciated. ABDOMEN:  Soft, nontender.  Positive, normoactive bowel sounds. No organomegaly palpated. MSK:  No focal spinal tenderness to palpation. Full range of motion bilaterally in the upper extremities. EXTREMITIES:  No peripheral edema.   SKIN:  Clear with no obvious rashes or skin changes. No nail dyscrasia. NEURO:  Nonfocal. Well oriented.  Appropriate affect.    LAB RESULTS:  CMP     Component Value Date/Time   NA 142 02/21/2021 0829   K 3.9 02/21/2021 0829   CL 110  02/21/2021 0829   CO2 25 02/21/2021 0829   GLUCOSE 94 02/21/2021 0829   BUN 11 02/21/2021 0829   CREATININE 0.76 02/21/2021 0829   CREATININE 0.68 01/10/2021 0837   CREATININE 0.76 12/20/2013 1207   CALCIUM 9.2 02/21/2021 0829   PROT 6.6 02/21/2021 0829   ALBUMIN 3.6 02/21/2021 0829   AST 17 02/21/2021 0829   AST 23 01/10/2021 0837   ALT 14 02/21/2021 0829   ALT 21 01/10/2021 0837   ALKPHOS 65 02/21/2021 0829   BILITOT 0.3 02/21/2021 0829   BILITOT 0.5 01/10/2021 0837   GFRNONAA >60 02/21/2021 0829   GFRNONAA >60 01/10/2021 0837    No  results found for: TOTALPROTELP, ALBUMINELP, A1GS, A2GS, BETS, BETA2SER, GAMS, MSPIKE, SPEI  Lab Results  Component Value Date   WBC 6.0 03/01/2021   NEUTROABS 3.7 03/01/2021   HGB 10.7 (L) 03/01/2021   HCT 31.5 (L) 03/01/2021   MCV 96.9 03/01/2021   PLT 208 03/01/2021    No results found for: LABCA2  No components found for: FEXMDY709  No results for input(s): INR in the last 168 hours.  No results found for: LABCA2  No results found for: KHV747  No results found for: BUY370  No results found for: DUK383  No results found for: CA2729  No components found for: HGQUANT  No results found for: CEA1 / No results found for: CEA1   No results found for: AFPTUMOR  No results found for: CHROMOGRNA  No results found for: KPAFRELGTCHN, LAMBDASER, KAPLAMBRATIO (kappa/lambda light chains)  No results found for: HGBA, HGBA2QUANT, HGBFQUANT, HGBSQUAN (Hemoglobinopathy evaluation)   No results found for: LDH  No results found for: IRON, TIBC, IRONPCTSAT (Iron and TIBC)  No results found for: FERRITIN  Urinalysis No results found for: COLORURINE, APPEARANCEUR, LABSPEC, PHURINE, GLUCOSEU, HGBUR, BILIRUBINUR, KETONESUR, PROTEINUR, UROBILINOGEN, NITRITE, LEUKOCYTESUR   STUDIES: No results found.   ELIGIBLE FOR AVAILABLE RESEARCH PROTOCOL: no  ASSESSMENT: 60 y.o. [place] woman status post right breast upper outer quadrant  biopsy 08/21/2020 for a clinical mT2 N1, stage IIA/B invasive ductal carcinoma, grade 3, estrogen receptor positive, progesterone receptor weakly positive, HER-2 not amplified, with an MIB-1 of 40%  (a) breast MRI 09/05/2020 shows a clinical T2 N2, stage IIIA disease  (b) bone scan and chest CT scan 09/14/2020 show no evidence of metastatic disease  (1) genetics testing 09/18/2020 through the  [facility]-Expanded + RNAinsight panel found no deleterious mutations in AIP, ALK, APC, ATM, AXIN2, BAP1, BARD1, BLM, BMPR1A, BRCA1, BRCA2, BRIP1, CDC73, CDH1, CDK4, CDKN1B, CDKN2A, CHEK2, CTNNA1, DICER1, FANCC, FH, FLCN, GALNT12, KIF1B, LZTR1, MAX, MEN1, MET, MLH1, MSH2, MSH3, MSH6, MUTYH, NBN, NF1, NF2, NTHL1, PALB2, PHOX2B, PMS2, POT1, PRKAR1A, PTCH1, PTEN, RAD51C, RAD51D, RB1, RECQL, RET, SDHA, SDHAF2, SDHB, SDHC, SDHD, SMAD4, SMARCA4, SMARCB1, SMARCE1, STK11, SUFU, TMEM127, TP53, TSC1, TSC2, VHL and XRCC2 (sequencing and deletion/duplication); EGFR, EGLN1, HOXB13, KIT, MITF, PDGFRA, POLD1 and POLE (sequencing only); EPCAM and GREM1 (deletion/duplication only). RNA data is routinely analyzed for use in variant interpretation for all genes.  (2) neoadjuvant chemotherapy consisting of cyclophosphamide and doxorubicin in dose dense fashion x4 started 09/18/2020, completed 11/15/2020, followed by weekly paclitaxel x12 started 12/07/2020  (a) echo 09/11/2020 shows an ejection fraction in the 60-65% range  (b) CA cycles 3 and 4 were each delayed by 1 week given multiple side effects  (c) start of weekly paclitaxel also delayed 1 week for the same reason  (3) definitive surgery to follow: Patient is considering bilateral mastectomies without reconstruction  (4) adjuvant radiation  (5) adjuvant antiestrogens   PLAN: Melissa Mcgrath continues on her neoadjuvant chemotherapy with weekly Paclitaxel. Her [place] is improved and she will proceed with her next treatment today.  She has her breast MRI scheduled on 9/23.  Should  the intermittent numbness in her first three digits on her right hand worsen any, she will let us know, and we will likely need to stop her treatment.    Melissa Mcgrath will return on Tuesday for Granix and we will see her next week for labs, f/u and treatment.   She knows to call for any questions that may arise between now and her next appointment.  We  are happy to see her sooner if needed.   Total encounter time 20 minutes in chart review, lab review, face to face visit time, care coordination, and documentation of the encounter.   Wilber Bihari, NP 03/01/21 8:45 AM Medical Oncology and Hematology [facility] [place], [place] [address] Tel. [phone]    Fax. [phone]  *Total Encounter Time as defined by the Centers for Medicare and Medicaid Services includes, in addition to the face-to-face time of a patient visit (documented in the note above) non-face-to-face time: obtaining and reviewing outside history, ordering and reviewing medications, tests or procedures, care coordination (communications with other health care professionals or caregivers) and documentation in the medical record.

## 2021-03-05 ENCOUNTER — Encounter: Payer: Self-pay | Admitting: *Deleted

## 2021-03-07 ENCOUNTER — Encounter: Payer: Self-pay | Admitting: *Deleted

## 2021-03-07 ENCOUNTER — Inpatient Hospital Stay (HOSPITAL_BASED_OUTPATIENT_CLINIC_OR_DEPARTMENT_OTHER): Payer: No Typology Code available for payment source | Admitting: Adult Health

## 2021-03-07 ENCOUNTER — Encounter: Payer: Self-pay | Admitting: Adult Health

## 2021-03-07 ENCOUNTER — Inpatient Hospital Stay: Payer: No Typology Code available for payment source

## 2021-03-07 ENCOUNTER — Other Ambulatory Visit: Payer: Self-pay

## 2021-03-07 VITALS — BP 109/60 | HR 91 | Temp 97.5°F | Resp 19 | Ht 68.0 in | Wt 140.2 lb

## 2021-03-07 DIAGNOSIS — C50411 Malignant neoplasm of upper-outer quadrant of right female breast: Secondary | ICD-10-CM

## 2021-03-07 DIAGNOSIS — Z170 Estrogen receptor positive status [ER+]: Secondary | ICD-10-CM

## 2021-03-07 DIAGNOSIS — Z5111 Encounter for antineoplastic chemotherapy: Secondary | ICD-10-CM | POA: Diagnosis not present

## 2021-03-07 DIAGNOSIS — Z95828 Presence of other vascular implants and grafts: Secondary | ICD-10-CM

## 2021-03-07 LAB — CBC WITH DIFFERENTIAL/PLATELET
Abs Immature Granulocytes: 0.04 10*3/uL (ref 0.00–0.07)
Basophils Absolute: 0.1 10*3/uL (ref 0.0–0.1)
Basophils Relative: 2 %
Eosinophils Absolute: 0.1 10*3/uL (ref 0.0–0.5)
Eosinophils Relative: 4 %
HCT: 28.8 % — ABNORMAL LOW (ref 36.0–46.0)
Hemoglobin: 9.9 g/dL — ABNORMAL LOW (ref 12.0–15.0)
Immature Granulocytes: 2 %
Lymphocytes Relative: 23 %
Lymphs Abs: 0.6 10*3/uL — ABNORMAL LOW (ref 0.7–4.0)
MCH: 32.8 pg (ref 26.0–34.0)
MCHC: 34.4 g/dL (ref 30.0–36.0)
MCV: 95.4 fL (ref 80.0–100.0)
Monocytes Absolute: 0.3 10*3/uL (ref 0.1–1.0)
Monocytes Relative: 12 %
Neutro Abs: 1.4 10*3/uL — ABNORMAL LOW (ref 1.7–7.7)
Neutrophils Relative %: 57 %
Platelets: 202 10*3/uL (ref 150–400)
RBC: 3.02 MIL/uL — ABNORMAL LOW (ref 3.87–5.11)
RDW: 13.1 % (ref 11.5–15.5)
WBC: 2.5 10*3/uL — ABNORMAL LOW (ref 4.0–10.5)
nRBC: 0 % (ref 0.0–0.2)

## 2021-03-07 LAB — COMPREHENSIVE METABOLIC PANEL
ALT: 12 U/L (ref 0–44)
AST: 15 U/L (ref 15–41)
Albumin: 3.7 g/dL (ref 3.5–5.0)
Alkaline Phosphatase: 70 U/L (ref 38–126)
Anion gap: 7 (ref 5–15)
BUN: 12 mg/dL (ref 6–20)
CO2: 25 mmol/L (ref 22–32)
Calcium: 9.2 mg/dL (ref 8.9–10.3)
Chloride: 109 mmol/L (ref 98–111)
Creatinine, Ser: 0.73 mg/dL (ref 0.44–1.00)
GFR, Estimated: >60 mL/min (ref >60)
Glucose, Bld: 98 mg/dL (ref 70–99)
Potassium: 3.9 mmol/L (ref 3.5–5.1)
Sodium: 141 mmol/L (ref 135–145)
Total Bilirubin: 0.5 mg/dL (ref 0.3–1.2)
Total Protein: 6.5 g/dL (ref 6.5–8.1)

## 2021-03-07 MED ORDER — SODIUM CHLORIDE 0.9% FLUSH
10.0000 mL | Freq: Once | INTRAVENOUS | Status: AC
  Administered 2021-03-07: 10 mL

## 2021-03-07 NOTE — Progress Notes (Addendum)
[place]  [phone] [phone]     ID: Melissa Mcgrath DOB: 08/22/1960  MR#: [id number]  NHA#:[id number]  Patient Care Team: Doree Albee, MD (Inactive) as PCP - General (Internal Medicine) Mauro Kaufmann, RN as Oncology Nurse Navigator Rockwell Germany, RN as Oncology Nurse Navigator Magrinat, Virgie Dad, MD as Consulting Physician (Oncology) Stark Klein, MD as Consulting Physician (General Surgery) Gery Pray, MD as Consulting Physician (Radiation Oncology) Dian Queen, MD as Consulting Physician (Obstetrics and Gynecology) Ulla Gallo, MD as Consulting Physician (Dermatology) Scot Dock, NP OTHER MD:   CHIEF COMPLAINT: Estrogen receptor positive breast cancer  CURRENT TREATMENT: Neoadjuvant chemotherapy   INTERVAL HISTORY: Melissa Mcgrath returns today for follow up and treatment of her estrogen receptor positive breast cancer.  She completed her 4 cycles of cyclophosphamide and doxorubicin 11/15/2020 and received an extra week to recover since those treatments were really rough on her.    She is currently receiving weekly Paclitaxel.  Today is week 10 of 12 doses planned. We held her chemotherapy a couple of cycles ago due to neutropenia.  She received Granix x 1 and it increased to 3.7, today her [place] is 1.4, she did not receive granix after this most recent cycle.    She went  with her husband who was scheduled for CT scan and myelogram yesterday for back surgery.  He could not get cleared due to his heart being in a different rhythm.  His back surgery is delayed.  She says she mowed her yard this past week and enjoyed it very much.  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  She has had carpal tunnel surgery on this hand.  She denis any numbness or tingling elsewhere in her fingertips or toes.   Her MRI is scheduled for 03/15/2021.  She is scheduled for f/u with Dr. Barry Dienes on  03/22/2021.   REVIEW OF SYSTEMS: Review of Systems  Constitutional:  Positive for fatigue. Negative for appetite change, chills, fever and unexpected weight change.  HENT:   Negative for hearing loss, lump/mass, mouth sores and trouble swallowing.   Eyes:  Negative for eye problems and icterus.  Respiratory:  Negative for chest tightness, cough and shortness of breath.   Cardiovascular:  Negative for chest pain, leg swelling and palpitations.  Gastrointestinal:  Negative for abdominal distention, abdominal pain, constipation, diarrhea, nausea and vomiting.  Endocrine: Negative for hot flashes.  Genitourinary:  Negative for difficulty urinating.   Musculoskeletal:  Negative for arthralgias and back pain.  Skin:  Negative for itching and rash.  Neurological:  Positive for numbness. Negative for dizziness, extremity weakness and headaches.  Hematological:  Negative for adenopathy. Does not bruise/bleed easily.  Psychiatric/Behavioral:  Negative for depression. The patient is not nervous/anxious.      COVID 19 VACCINATION STATUS: [place] x2, most recently 01/2020, no booster as of 08/29/2020   HISTORY OF CURRENT ILLNESS: From the original intake note:  Melissa Mcgrath herself palpated an upper-outer right breast lump and noted associated intermittent pain and itching. She underwent bilateral diagnostic mammography with tomography and right breast ultrasonography at [facility] on 08/20/2020 showing: breast density category B; palpable 2.1 cm irregular mass in right breast at 9 o'clock; three abnormal-appearing right axillary tail nodes; indeterminate 4 mm oval mass located 1.8 cm lateral to dominant mass.  Accordingly on 08/21/2020 she proceeded to biopsy of the right breast area in question. The pathology from this procedure ([id number]) showed:  invasive mammary carcinoma, e-cadherin positive, grade 3. Prognostic indicators significant for: estrogen receptor, 90% positive with strong staining intensity  and progesterone receptor, 2% positive with weak staining intensity. Proliferation marker Ki67 at 40%. HER2 equivocal by immunohistochemistry (2+), but negative by fluorescent in situ hybridization (signals ratio and number per cell not available today).  The biopsied lymph node in the right axillary tail also showed invasive ductal carcinoma. No lymph node tissue was identified, possibly representing an entirely replaced lymph node.  Cancer Staging Malignant neoplasm of upper-outer quadrant of right breast in female, estrogen receptor positive ([place]) Staging form: Breast, AJCC 8th Edition - Clinical stage from 08/29/2020: Stage IIIA (cT2, cN2, cM0, G3, ER+, PR+, HER2-) - Signed by Gardenia Phlegm, NP on 09/07/2020 Stage prefix: Initial diagnosis Stage used in treatment planning: Yes National guidelines used in treatment planning: Yes Type of national guideline used in treatment planning: NCCN  The patient's subsequent history is as detailed below.   PAST MEDICAL HISTORY: Past Medical History:  Diagnosis Date   Depression 01/05/2014   Facial basal cell cancer    Family history of breast cancer    Family history of melanoma    Family history of ovarian cancer    Family history of pancreatic cancer    Menopause 01/05/2014   Vaginal atrophy 11/09/2014    PAST SURGICAL HISTORY: Past Surgical History:  Procedure Laterality Date   BREAST ENHANCEMENT SURGERY     CARPAL TUNNEL RELEASE Right    PORTACATH PLACEMENT N/A 09/17/2020   Procedure: INSERTION PORT-A-CATH;  Surgeon: Stark Klein, MD;  Location: [facility];  Service: General;  Laterality: N/A;   refractive lensectomy Bilateral     FAMILY HISTORY: Family History  Problem Relation Age of Onset   Breast cancer Mother 70       breast   Hypertension Father    Heart attack Father    Alzheimer's disease Father    Fibromyalgia Sister    Diabetes Sister    Heart attack Sister    Hypertension Sister     Hypertension Brother    Diabetes Brother    Pancreatic cancer Maternal Grandmother 83       Pancreatic   Melanoma Paternal Uncle        dx 54s   Melanoma Paternal Uncle        dx 65s   Ovarian cancer Paternal Aunt        dx 50s/60s  Her father died at age 63 from Alzheimer's. Her mother died at age 13 from metastatic breast cancer. She was initially diagnosed with breast cancer at age 74. Melissa Mcgrath has two brothers and one sister. In addition to her mother, she reports cancer in a paternal aunt (ovarian in mid 6's) and in her maternal grandmother (pancreatic at age 4).    GYNECOLOGIC HISTORY:  No LMP recorded. Patient is postmenopausal. Menarche: 60 years old Age at first live birth: 60 years old [place] P 2 LMP 2010 Contraceptive: used for approx. 20 years HRT used from 11/2019 until diagnosis  Hysterectomy? no BSO? 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 Husband Herbie [place] "Fritz Pickerel" is retired from working for [employer]. For exercise, she walks daily, rides horses every other week, and does farm work daily. Daughter Cline Cools, age 70, is a horse trainer/instructor. Daughter Lavell Anchors, age 2, is a Copywriter, advertising in [place]. Dontasia has one grandchild. She attends [employer].    ADVANCED DIRECTIVES: In the absence  of any documentation to the contrary, the patient's spouse is their HCPOA.    HEALTH MAINTENANCE: Social History   Tobacco Use   Smoking status: Never   Smokeless tobacco: Never  Vaping Use   Vaping Use: Never used  Substance Use Topics   Alcohol use: No   Drug use: No     Colonoscopy: never done  PAP: 01/2020  Bone density: never done   No Known Allergies  Current Outpatient Medications  Medication Sig Dispense Refill   docusate sodium (COLACE) 100 MG capsule TAKE 2 CAPSULES BY MOUTH TWICE DAILY AS NEEDED FOR CONSTIPATION 60 capsule 2   ibuprofen (ADVIL) 200 MG tablet Take 400 mg by mouth 3 (three) times daily as needed.      lidocaine-prilocaine (EMLA) cream Apply 1 application topically as needed. 30 g 1   loratadine (CLARITIN) 10 MG tablet Take 1 tablet (10 mg total) by mouth daily. 90 tablet 0   LORazepam (ATIVAN) 0.5 MG tablet Take 1 tablet (0.5 mg total) by mouth at bedtime as needed (Nausea or vomiting). 20 tablet 0   magic mouthwash SOLN 5 ml swish and spit QID prn, oral tenderness 240 mL 2   Multiple Vitamin (MULTIVITAMIN) tablet Take 1 tablet by mouth daily.     ondansetron (ZOFRAN) 8 MG tablet Take 1 tablet (8 mg total) by mouth every 8 (eight) hours as needed for nausea or vomiting. Start day 3 after chemotherapy. 20 tablet 0   polyethylene glycol powder (MIRALAX) 17 GM/SCOOP powder Take 17 g by mouth daily as needed. 255 g 0   prochlorperazine (COMPAZINE) 10 MG tablet Take 0.5 tablets (5 mg total) by mouth every 6 (six) hours as needed (Nausea or vomiting). 30 tablet 1   triamcinolone lotion (KENALOG) 0.1 % Apply 1 application topically 3 (three) times daily. 120 mL 3   valACYclovir (VALTREX) 1000 MG tablet Take 1 tablet (1,000 mg total) by mouth daily. 30 tablet 0   No current facility-administered medications for this visit.    OBJECTIVE: White woman in no acute distress  Vitals:   03/07/21 1158  BP: 109/60  Pulse: 91  Resp: 19  Temp: (!) 97.5 F (36.4 C)  SpO2: 100%       Body mass index is 21.32 kg/m.   Wt Readings from Last 3 Encounters:  03/07/21 140 lb 3.2 oz (63.6 kg)  03/01/21 142 lb (64.4 kg)  02/21/21 140 lb 6.4 oz (63.7 kg)     ECOG FS:1 - Symptomatic but completely ambulatory GENERAL: Patient is a well appearing female in no acute distress HEENT:  Sclerae anicteric.  Oropharynx clear and moist. No ulcerations or evidence of oropharyngeal candidiasis. Neck is supple.  NODES:  No cervical, supraclavicular, or axillary lymphadenopathy palpated.  BREAST EXAM:  no sign of progression LUNGS:  Clear to auscultation bilaterally.  No wheezes or rhonchi. HEART:  Regular rate and  rhythm. No murmur appreciated. ABDOMEN:  Soft, nontender.  Positive, normoactive bowel sounds. No organomegaly palpated. MSK:  No focal spinal tenderness to palpation. Full range of motion bilaterally in the upper extremities. EXTREMITIES:  No peripheral edema.   SKIN:  Clear with no obvious rashes or skin changes. No nail dyscrasia. NEURO:  Nonfocal. Well oriented.  Appropriate affect.    LAB RESULTS:  CMP     Component Value Date/Time   NA 141 03/01/2021 0816   K 3.8 03/01/2021 0816   CL 108 03/01/2021 0816   CO2 22 03/01/2021 0816   GLUCOSE 98 03/01/2021  0816   BUN 12 03/01/2021 0816   CREATININE 0.85 03/01/2021 0816   CREATININE 0.68 01/10/2021 0837   CREATININE 0.76 12/20/2013 1207   CALCIUM 9.2 03/01/2021 0816   PROT 6.6 03/01/2021 0816   ALBUMIN 3.6 03/01/2021 0816   AST 13 (L) 03/01/2021 0816   AST 23 01/10/2021 0837   ALT 12 03/01/2021 0816   ALT 21 01/10/2021 0837   ALKPHOS 76 03/01/2021 0816   BILITOT 0.3 03/01/2021 0816   BILITOT 0.5 01/10/2021 0837   GFRNONAA >60 03/01/2021 0816   GFRNONAA >60 01/10/2021 0837    No results found for: TOTALPROTELP, ALBUMINELP, A1GS, A2GS, BETS, BETA2SER, GAMS, MSPIKE, SPEI  Lab Results  Component Value Date   WBC 6.0 03/01/2021   NEUTROABS 3.7 03/01/2021   HGB 10.7 (L) 03/01/2021   HCT 31.5 (L) 03/01/2021   MCV 96.9 03/01/2021   PLT 208 03/01/2021    No results found for: LABCA2  No components found for: LNLGXQ119  No results for input(s): INR in the last 168 hours.  No results found for: LABCA2  No results found for: ERD408  No results found for: XKG818  No results found for: HUD149  No results found for: CA2729  No components found for: HGQUANT  No results found for: CEA1 / No results found for: CEA1   No results found for: AFPTUMOR  No results found for: CHROMOGRNA  No results found for: KPAFRELGTCHN, LAMBDASER, KAPLAMBRATIO (kappa/lambda light chains)  No results found for: HGBA, HGBA2QUANT,  HGBFQUANT, HGBSQUAN (Hemoglobinopathy evaluation)   No results found for: LDH  No results found for: IRON, TIBC, IRONPCTSAT (Iron and TIBC)  No results found for: FERRITIN  Urinalysis No results found for: COLORURINE, APPEARANCEUR, LABSPEC, PHURINE, GLUCOSEU, HGBUR, BILIRUBINUR, KETONESUR, PROTEINUR, UROBILINOGEN, NITRITE, LEUKOCYTESUR   STUDIES: No results found.   ELIGIBLE FOR AVAILABLE RESEARCH PROTOCOL: no  ASSESSMENT: 60 y.o. [place] woman status post right breast upper outer quadrant biopsy 08/21/2020 for a clinical mT2 N1, stage IIA/B invasive ductal carcinoma, grade 3, estrogen receptor positive, progesterone receptor weakly positive, HER-2 not amplified, with an MIB-1 of 40%  (a) breast MRI 09/05/2020 shows a clinical T2 N2, stage IIIA disease  (b) bone scan and chest CT scan 09/14/2020 show no evidence of metastatic disease  (1) genetics testing 09/18/2020 through the  [facility]Next-Expanded + RNAinsight panel found no deleterious mutations in AIP, ALK, APC, ATM, AXIN2, BAP1, BARD1, BLM, BMPR1A, BRCA1, BRCA2, BRIP1, CDC73, CDH1, CDK4, CDKN1B, CDKN2A, CHEK2, CTNNA1, DICER1, FANCC, FH, FLCN, GALNT12, KIF1B, LZTR1, MAX, MEN1, MET, MLH1, MSH2, MSH3, MSH6, MUTYH, NBN, NF1, NF2, NTHL1, PALB2, PHOX2B, PMS2, POT1, PRKAR1A, PTCH1, PTEN, RAD51C, RAD51D, RB1, RECQL, RET, SDHA, SDHAF2, SDHB, SDHC, SDHD, SMAD4, SMARCA4, SMARCB1, SMARCE1, STK11, SUFU, TMEM127, TP53, TSC1, TSC2, VHL and XRCC2 (sequencing and deletion/duplication); EGFR, EGLN1, HOXB13, KIT, MITF, PDGFRA, POLD1 and POLE (sequencing only); EPCAM and GREM1 (deletion/duplication only). RNA data is routinely analyzed for use in variant interpretation for all genes.  (2) neoadjuvant chemotherapy consisting of cyclophosphamide and doxorubicin in dose dense fashion x4 started 09/18/2020, completed 11/15/2020, followed by weekly paclitaxel x12 started 12/07/2020  (a) echo 09/11/2020 shows an ejection fraction in the 60-65%  range  (b) CA cycles 3 and 4 were each delayed by 1 week given multiple side effects  (c) start of weekly paclitaxel also delayed 1 week for the same reason  (d) paclitaxel discontinued after 9 doses due to neuropathy  (3) definitive surgery to follow: Patient is considering bilateral mastectomies without reconstruction  (4)  adjuvant radiation pending  (5) adjuvant antiestrogens to follow   PLAN: Melissa Mcgrath has tolerated treatment thus far very well.  Due to her neuropathy, she met with myself and Dr. Jana Hakim to discuss this further.  She will not continue on treatment with the weekly Paclitaxel and will forego the final three cycles of therapy.  At this point the risk of potential permanent nerve damage does not outweigh the less than 1% benefit of continuing.  Melissa Mcgrath understands this and is in agreement.    She has MRI breast scheduled on 03/15/2021 and f/u with Dr. Barry Dienes on 03/22/2021.  Dr. Jana Hakim reviewed with her that he would see her back in early November.   I sent a message to our breast navigators updating them on everything.  She knows to call for any questions that may arise between now and her next appointment.  We are happy to see her sooner if needed.     Total encounter time 30 minutes in chart review, lab review, face to face visit time, care coordination, and documentation of the encounter.   Wilber Bihari, NP 03/07/21 12:00 PM Medical Oncology and Hematology [facility] [place], [place] [address] Tel. [phone]    Fax. [phone]   ADDENDUM: Aleni has grade 1 but persistent sensory changes in the first three digits of her right hand. While there is an element of carpal tunnel in this radial distribution pattern, nevertheless the possibility of further damage from taxanes is real and we are stopping her chemotherapy. She understands she received more than 85% of her planned chemotherapy and omitting the final 3 doses is not expected to have  a significant effect on her risk of recurrence.  She is now ready for definitive surgery. That will be followed by adjuvant radiation and then antiestrogens. I will see her in about 8 weeks to discuss those options.  If her neuropathy persists she may qualify for our current PEA study  I personally saw this patient and performed a substantive portion of this encounter with the listed APP documented above.   Chauncey Cruel, MD Medical Oncology and Hematology [facility] [address] [place], [place] [address] Tel. [phone]    Fax. [phone]   *Total Encounter Time as defined by the Centers for Medicare and Medicaid Services includes, in addition to the face-to-face time of a patient visit (documented in the note above) non-face-to-face time: obtaining and reviewing outside history, ordering and reviewing medications, tests or procedures, care coordination (communications with other health care professionals or caregivers) and documentation in the medical record.

## 2021-03-08 ENCOUNTER — Encounter: Payer: Self-pay | Admitting: Oncology

## 2021-03-08 ENCOUNTER — Ambulatory Visit: Payer: No Typology Code available for payment source

## 2021-03-08 ENCOUNTER — Other Ambulatory Visit: Payer: No Typology Code available for payment source

## 2021-03-08 ENCOUNTER — Ambulatory Visit: Payer: No Typology Code available for payment source | Admitting: Adult Health

## 2021-03-14 ENCOUNTER — Encounter: Payer: Self-pay | Admitting: Adult Health

## 2021-03-14 ENCOUNTER — Ambulatory Visit: Payer: No Typology Code available for payment source | Admitting: Adult Health

## 2021-03-14 ENCOUNTER — Other Ambulatory Visit: Payer: No Typology Code available for payment source

## 2021-03-14 ENCOUNTER — Ambulatory Visit: Payer: No Typology Code available for payment source

## 2021-03-15 ENCOUNTER — Other Ambulatory Visit: Payer: No Typology Code available for payment source

## 2021-03-15 ENCOUNTER — Ambulatory Visit: Payer: No Typology Code available for payment source

## 2021-03-15 ENCOUNTER — Other Ambulatory Visit: Payer: Self-pay

## 2021-03-15 ENCOUNTER — Ambulatory Visit: Payer: No Typology Code available for payment source | Admitting: Adult Health

## 2021-03-15 ENCOUNTER — Ambulatory Visit
Admission: RE | Admit: 2021-03-15 | Discharge: 2021-03-15 | Disposition: A | Discharge status: Home / Self Care | Payer: No Typology Code available for payment source | Source: Ambulatory Visit | Attending: Adult Health | Admitting: Adult Health

## 2021-03-15 DIAGNOSIS — C50411 Malignant neoplasm of upper-outer quadrant of right female breast: Secondary | ICD-10-CM

## 2021-03-15 DIAGNOSIS — Z170 Estrogen receptor positive status [ER+]: Secondary | ICD-10-CM

## 2021-03-15 MED ORDER — GADOBUTROL 1 MMOL/ML IV SOLN
6.0000 mL | Freq: Once | INTRAVENOUS | Status: AC | PRN
  Administered 2021-03-15: 6 mL via INTRAVENOUS

## 2021-03-18 ENCOUNTER — Encounter: Payer: Self-pay | Admitting: *Deleted

## 2021-03-22 ENCOUNTER — Other Ambulatory Visit: Payer: Self-pay | Admitting: General Surgery

## 2021-03-25 ENCOUNTER — Encounter: Payer: Self-pay | Admitting: *Deleted

## 2021-04-22 NOTE — Pre-Procedure Instructions (Signed)
Surgical Instructions    Your procedure is scheduled on Thursday 04/25/21.   Report to [facility] Main Entrance "A" at 07:00 A.M., then check in with the Admitting office.  Call this number if you have problems the morning of surgery:  [phone]   If you have any questions prior to your surgery date call [phone]: Open Monday-Friday 8am-4pm    Remember:  Do not eat after midnight the night before your surgery  You may drink clear liquids until 06:00 A.M. the morning of your surgery.   Clear liquids allowed are: Water, Non-Citrus Juices (without pulp), Carbonated Beverages, Clear Tea, Black Coffee ONLY (NO MILK, CREAM OR POWDERED CREAMER of any kind), and Gatorade    Take these medicines the morning of surgery with A SIP OF WATER   NONE  As of today, STOP taking any Aspirin (unless otherwise instructed by your surgeon) Aleve, Naproxen, Ibuprofen, Motrin, Advil, Goody's, BC's, all herbal medications, fish oil, and all vitamins.     After your COVID test   You are not required to quarantine however you are required to wear a well-fitting mask when you are out and around people not in your household.  If your mask becomes wet or soiled, replace with a new one.  Wash your hands often with soap and water for 20 seconds or clean your hands with an alcohol-based hand sanitizer that contains at least 60% alcohol.  Do not share personal items.  Notify your provider: if you are in close contact with someone who has COVID  or if you develop a fever of 100.4 or greater, sneezing, cough, sore throat, shortness of breath or body aches.             Do not wear jewelry or makeup Do not wear lotions, powders, perfumes/colognes, or deodorant. Do not shave 48 hours prior to surgery.  Men may shave face and neck. Do not bring valuables to the hospital. DO Not wear nail polish, gel polish, artificial nails, or any other type of covering on natural nails including finger and toenails. If  patients have artificial nails, gel coating, etc. that need to be removed by a nail salon, please have this removed prior to surgery or surgery may need to be canceled/delayed if the surgeon/ anesthesia feels like the patient is unable to be adequately monitored.             [facility] is not responsible for any belongings or valuables.  Do NOT Smoke (Tobacco/Vaping)  24 hours prior to your procedure  If you use a CPAP at night, you may bring your mask for your overnight stay.   Contacts, glasses, hearing aids, dentures or partials may not be worn into surgery, please bring cases for these belongings   For patients admitted to the hospital, discharge time will be determined by your treatment team.   Patients discharged the day of surgery will not be allowed to drive home, and someone needs to stay with them for 24 hours.  NO VISITORS WILL BE ALLOWED IN PRE-OP WHERE PATIENTS ARE PREPPED FOR SURGERY.  ONLY 1 SUPPORT PERSON MAY BE PRESENT IN THE WAITING ROOM WHILE YOU ARE IN SURGERY.  IF YOU ARE TO BE ADMITTED, ONCE YOU ARE IN YOUR ROOM YOU WILL BE ALLOWED TWO (2) VISITORS. 1 (ONE) VISITOR MAY STAY OVERNIGHT BUT MUST ARRIVE TO THE ROOM BY 8pm.  Minor children may have two parents present. Special consideration for safety and communication needs will be reviewed on a  case by case basis.  Special instructions:    Oral Hygiene is also important to reduce your risk of infection.  Remember - BRUSH YOUR TEETH THE MORNING OF SURGERY WITH YOUR REGULAR TOOTHPASTE   [facility]- Preparing For Surgery  Before surgery, you can play an important role. Because skin is not sterile, your skin needs to be as free of germs as possible. You can reduce the number of germs on your skin by washing with CHG (chlorahexidine gluconate) Soap before surgery.  CHG is an antiseptic cleaner which kills germs and bonds with the skin to continue killing germs even after washing.     Please do not use if you have an allergy  to CHG or antibacterial soaps. If your skin becomes reddened/irritated stop using the CHG.  Do not shave (including legs and underarms) for at least 48 hours prior to first CHG shower. It is OK to shave your face.  Please follow these instructions carefully.     Shower the NIGHT BEFORE SURGERY and the MORNING OF SURGERY with CHG Soap.   If you chose to wash your hair, wash your hair first as usual with your normal shampoo. After you shampoo, rinse your hair and body thoroughly to remove the shampoo.  Then [employer] and genitals (private parts) with your normal soap and rinse thoroughly to remove soap.  After that Use CHG Soap as you would any other liquid soap. You can apply CHG directly to the skin and wash gently with a scrungie or a clean washcloth.   Apply the CHG Soap to your body ONLY FROM THE NECK DOWN.  Do not use on open wounds or open sores. Avoid contact with your eyes, ears, mouth and genitals (private parts). Wash Face and genitals (private parts)  with your normal soap.   Wash thoroughly, paying special attention to the area where your surgery will be performed.  Thoroughly rinse your body with warm water from the neck down.  DO NOT shower/wash with your normal soap after using and rinsing off the CHG Soap.  Pat yourself dry with a CLEAN TOWEL.  Wear CLEAN PAJAMAS to bed the night before surgery  Place CLEAN SHEETS on your bed the night before your surgery  DO NOT SLEEP WITH PETS.   Day of Surgery:  Take a shower with CHG soap. Wear Clean/Comfortable clothing the morning of surgery Do not apply any deodorants/lotions.   Remember to brush your teeth WITH YOUR REGULAR TOOTHPASTE.   Please read over the following fact sheets that you were given.

## 2021-04-23 ENCOUNTER — Encounter (HOSPITAL_COMMUNITY): Payer: Self-pay

## 2021-04-23 ENCOUNTER — Other Ambulatory Visit: Payer: Self-pay

## 2021-04-23 ENCOUNTER — Encounter (HOSPITAL_COMMUNITY)
Admission: RE | Admit: 2021-04-23 | Discharge: 2021-04-23 | Disposition: A | Discharge status: Home / Self Care | Payer: No Typology Code available for payment source | Source: Ambulatory Visit | Attending: General Surgery | Admitting: General Surgery

## 2021-04-23 VITALS — BP 111/66 | HR 69 | Temp 98.3°F | Resp 17 | Ht 68.0 in | Wt 142.6 lb

## 2021-04-23 DIAGNOSIS — Z20822 Contact with and (suspected) exposure to covid-19: Secondary | ICD-10-CM | POA: Insufficient documentation

## 2021-04-23 DIAGNOSIS — Z01812 Encounter for preprocedural laboratory examination: Secondary | ICD-10-CM | POA: Diagnosis not present

## 2021-04-23 DIAGNOSIS — Z01818 Encounter for other preprocedural examination: Secondary | ICD-10-CM

## 2021-04-23 HISTORY — DX: Anemia, unspecified: D64.9

## 2021-04-23 LAB — CBC
HCT: 37.8 % (ref 36.0–46.0)
Hemoglobin: 12.1 g/dL (ref 12.0–15.0)
MCH: 31.7 pg (ref 26.0–34.0)
MCHC: 32 g/dL (ref 30.0–36.0)
MCV: 99 fL (ref 80.0–100.0)
Platelets: 233 10*3/uL (ref 150–400)
RBC: 3.82 MIL/uL — ABNORMAL LOW (ref 3.87–5.11)
RDW: 12.8 % (ref 11.5–15.5)
WBC: 3.6 10*3/uL — ABNORMAL LOW (ref 4.0–10.5)
nRBC: 0 % (ref 0.0–0.2)

## 2021-04-23 LAB — SARS CORONAVIRUS 2 (TAT 6-24 HRS): SARS Coronavirus 2: NEGATIVE

## 2021-04-23 NOTE — Progress Notes (Signed)
PCP - Pablo Lawrence Cardiologist - denies  PPM/ICD - denies Device Orders -n/a  Rep Notified - n/a  Chest x-ray - n/a EKG - n/a Stress Test - denies ECHO - 09/11/20- For chemo treatment Cardiac Cath - denies  Sleep Study - denies CPAP - n/a  Fasting Blood Sugar - n/a   Blood Thinner Instructions: n/a Aspirin Instructions: n/a  ERAS Protcol - Yes PRE-SURGERY Ensure or G2- No  COVID TEST- 04/23/21. Pending.    Anesthesia review: No  Patient denies shortness of breath, fever, cough and chest pain at PAT appointment   All instructions explained to the patient, with a verbal understanding of the material. Patient agrees to go over the instructions while at home for a better understanding. Patient also instructed to self quarantine after being tested for COVID-19. The opportunity to ask questions was provided.

## 2021-04-25 ENCOUNTER — Other Ambulatory Visit: Payer: Self-pay

## 2021-04-25 ENCOUNTER — Ambulatory Visit (HOSPITAL_COMMUNITY)
Admission: RE | Admit: 2021-04-25 | Discharge: 2021-04-26 | Disposition: A | Discharge status: Home / Self Care | Payer: No Typology Code available for payment source | Attending: General Surgery | Admitting: General Surgery

## 2021-04-25 ENCOUNTER — Ambulatory Visit (HOSPITAL_COMMUNITY): Payer: No Typology Code available for payment source | Admitting: Certified Registered"

## 2021-04-25 ENCOUNTER — Encounter (HOSPITAL_COMMUNITY)
Admission: RE | Disposition: A | Discharge status: Home / Self Care | Payer: Self-pay | Source: Home / Self Care | Attending: General Surgery

## 2021-04-25 ENCOUNTER — Encounter (HOSPITAL_COMMUNITY): Payer: Self-pay | Admitting: General Surgery

## 2021-04-25 DIAGNOSIS — C50911 Malignant neoplasm of unspecified site of right female breast: Secondary | ICD-10-CM | POA: Diagnosis present

## 2021-04-25 DIAGNOSIS — Z85828 Personal history of other malignant neoplasm of skin: Secondary | ICD-10-CM | POA: Insufficient documentation

## 2021-04-25 DIAGNOSIS — Z79890 Hormone replacement therapy (postmenopausal): Secondary | ICD-10-CM | POA: Diagnosis not present

## 2021-04-25 DIAGNOSIS — Z8041 Family history of malignant neoplasm of ovary: Secondary | ICD-10-CM | POA: Diagnosis not present

## 2021-04-25 DIAGNOSIS — C50411 Malignant neoplasm of upper-outer quadrant of right female breast: Secondary | ICD-10-CM | POA: Insufficient documentation

## 2021-04-25 DIAGNOSIS — Z170 Estrogen receptor positive status [ER+]: Secondary | ICD-10-CM | POA: Diagnosis not present

## 2021-04-25 DIAGNOSIS — G629 Polyneuropathy, unspecified: Secondary | ICD-10-CM | POA: Insufficient documentation

## 2021-04-25 DIAGNOSIS — Z9221 Personal history of antineoplastic chemotherapy: Secondary | ICD-10-CM | POA: Diagnosis not present

## 2021-04-25 DIAGNOSIS — C773 Secondary and unspecified malignant neoplasm of axilla and upper limb lymph nodes: Secondary | ICD-10-CM | POA: Insufficient documentation

## 2021-04-25 DIAGNOSIS — Z800 Family history of malignant neoplasm of digestive organs: Secondary | ICD-10-CM | POA: Diagnosis not present

## 2021-04-25 DIAGNOSIS — Z803 Family history of malignant neoplasm of breast: Secondary | ICD-10-CM | POA: Insufficient documentation

## 2021-04-25 HISTORY — PX: BILATERAL TOTAL MASTECTOMY WITH AXILLARY LYMPH NODE DISSECTION: SHX6364

## 2021-04-25 HISTORY — PX: BREAST IMPLANT REMOVAL: SHX5361

## 2021-04-25 HISTORY — PX: TOTAL MASTECTOMY: SHX6129

## 2021-04-25 SURGERY — BILATERAL TOTAL MASTECTOMY WITH AXILLARY LYMPH NODE DISSECTION
Anesthesia: General | Site: Breast | Laterality: Right

## 2021-04-25 MED ORDER — PHENYLEPHRINE 40 MCG/ML (10ML) SYRINGE FOR IV PUSH (FOR BLOOD PRESSURE SUPPORT)
PREFILLED_SYRINGE | INTRAVENOUS | Status: AC
  Filled 2021-04-25: qty 10

## 2021-04-25 MED ORDER — ONDANSETRON HCL 4 MG/2ML IJ SOLN
INTRAMUSCULAR | Status: DC | PRN
  Administered 2021-04-25: 4 mg via INTRAVENOUS

## 2021-04-25 MED ORDER — MIDAZOLAM HCL 2 MG/2ML IJ SOLN
INTRAMUSCULAR | Status: AC
  Filled 2021-04-25: qty 2

## 2021-04-25 MED ORDER — CHLORHEXIDINE GLUCONATE CLOTH 2 % EX PADS: 6.0000 | MEDICATED_PAD | Freq: Once | CUTANEOUS | Status: DC

## 2021-04-25 MED ORDER — DIPHENHYDRAMINE HCL 12.5 MG/5ML PO ELIX: 12.5000 mg | ORAL_SOLUTION | Freq: Four times a day (QID) | ORAL | Status: DC | PRN

## 2021-04-25 MED ORDER — ROPIVACAINE HCL 5 MG/ML IJ SOLN
INTRAMUSCULAR | Status: DC | PRN
  Administered 2021-04-25 (×12): 5 mL via PERINEURAL

## 2021-04-25 MED ORDER — GABAPENTIN 300 MG PO CAPS
300.0000 mg | ORAL_CAPSULE | ORAL | Status: AC
  Administered 2021-04-25: 300 mg via ORAL
  Filled 2021-04-25: qty 1

## 2021-04-25 MED ORDER — PROPOFOL 10 MG/ML IV BOLUS
INTRAVENOUS | Status: DC | PRN
  Administered 2021-04-25: 150 mg via INTRAVENOUS

## 2021-04-25 MED ORDER — CEFAZOLIN SODIUM-DEXTROSE 2-4 GM/100ML-% IV SOLN
2.0000 g | Freq: Three times a day (TID) | INTRAVENOUS | Status: AC
  Administered 2021-04-25: 2 g via INTRAVENOUS
  Filled 2021-04-25: qty 100

## 2021-04-25 MED ORDER — FENTANYL CITRATE (PF) 100 MCG/2ML IJ SOLN
INTRAMUSCULAR | Status: AC
  Administered 2021-04-25: 100 ug
  Filled 2021-04-25: qty 2

## 2021-04-25 MED ORDER — LACTATED RINGERS IV SOLN: INTRAVENOUS | Status: DC

## 2021-04-25 MED ORDER — 0.9 % SODIUM CHLORIDE (POUR BTL) OPTIME
TOPICAL | Status: DC | PRN
  Administered 2021-04-25: 1000 mL

## 2021-04-25 MED ORDER — ROCURONIUM BROMIDE 10 MG/ML (PF) SYRINGE
PREFILLED_SYRINGE | INTRAVENOUS | Status: AC
  Filled 2021-04-25: qty 10

## 2021-04-25 MED ORDER — ONDANSETRON HCL 4 MG/2ML IJ SOLN
4.0000 mg | Freq: Four times a day (QID) | INTRAMUSCULAR | Status: DC | PRN
  Administered 2021-04-25: 4 mg via INTRAVENOUS
  Filled 2021-04-25: qty 2

## 2021-04-25 MED ORDER — KETOROLAC TROMETHAMINE 30 MG/ML IJ SOLN: 30.0000 mg | Freq: Once | INTRAMUSCULAR | Status: DC | PRN

## 2021-04-25 MED ORDER — CHLORHEXIDINE GLUCONATE 0.12 % MT SOLN
15.0000 mL | Freq: Once | OROMUCOSAL | Status: AC
  Administered 2021-04-25: 15 mL via OROMUCOSAL
  Filled 2021-04-25: qty 15

## 2021-04-25 MED ORDER — PROMETHAZINE HCL 25 MG/ML IJ SOLN: 6.2500 mg | INTRAMUSCULAR | Status: DC | PRN

## 2021-04-25 MED ORDER — ONDANSETRON HCL 4 MG/2ML IJ SOLN
INTRAMUSCULAR | Status: AC
  Filled 2021-04-25: qty 2

## 2021-04-25 MED ORDER — FENTANYL CITRATE (PF) 250 MCG/5ML IJ SOLN
INTRAMUSCULAR | Status: AC
  Filled 2021-04-25: qty 5

## 2021-04-25 MED ORDER — MEPERIDINE HCL 25 MG/ML IJ SOLN: 6.2500 mg | INTRAMUSCULAR | Status: DC | PRN

## 2021-04-25 MED ORDER — HYDROMORPHONE HCL 1 MG/ML IJ SOLN: 0.2500 mg | INTRAMUSCULAR | Status: DC | PRN

## 2021-04-25 MED ORDER — CEFAZOLIN SODIUM-DEXTROSE 2-4 GM/100ML-% IV SOLN
2.0000 g | INTRAVENOUS | Status: AC
  Administered 2021-04-25: 2 g via INTRAVENOUS
  Filled 2021-04-25: qty 100

## 2021-04-25 MED ORDER — PROCHLORPERAZINE EDISYLATE 10 MG/2ML IJ SOLN: 5.0000 mg | Freq: Four times a day (QID) | INTRAMUSCULAR | Status: DC | PRN

## 2021-04-25 MED ORDER — ONDANSETRON 4 MG PO TBDP: 4.0000 mg | ORAL_TABLET | Freq: Four times a day (QID) | ORAL | Status: DC | PRN

## 2021-04-25 MED ORDER — DEXAMETHASONE SODIUM PHOSPHATE 10 MG/ML IJ SOLN
INTRAMUSCULAR | Status: AC
  Filled 2021-04-25: qty 1

## 2021-04-25 MED ORDER — TRAMADOL HCL 50 MG PO TABS: 50.0000 mg | ORAL_TABLET | Freq: Four times a day (QID) | ORAL | Status: DC | PRN

## 2021-04-25 MED ORDER — BUPIVACAINE-EPINEPHRINE (PF) 0.25% -1:200000 IJ SOLN
INTRAMUSCULAR | Status: AC
  Filled 2021-04-25: qty 30

## 2021-04-25 MED ORDER — KETOROLAC TROMETHAMINE 30 MG/ML IJ SOLN: 30.0000 mg | Freq: Four times a day (QID) | INTRAMUSCULAR | Status: DC | PRN

## 2021-04-25 MED ORDER — MIDAZOLAM HCL 2 MG/2ML IJ SOLN: 2.0000 mg | Freq: Once | INTRAMUSCULAR | Status: DC

## 2021-04-25 MED ORDER — PROPOFOL 10 MG/ML IV BOLUS
INTRAVENOUS | Status: AC
  Filled 2021-04-25: qty 20

## 2021-04-25 MED ORDER — ROCURONIUM BROMIDE 10 MG/ML (PF) SYRINGE
PREFILLED_SYRINGE | INTRAVENOUS | Status: DC | PRN
  Administered 2021-04-25: 60 mg via INTRAVENOUS

## 2021-04-25 MED ORDER — DEXMEDETOMIDINE (PRECEDEX) IN NS 20 MCG/5ML (4 MCG/ML) IV SYRINGE
PREFILLED_SYRINGE | INTRAVENOUS | Status: AC
  Filled 2021-04-25: qty 5

## 2021-04-25 MED ORDER — CLONIDINE HCL (ANALGESIA) 100 MCG/ML EP SOLN
EPIDURAL | Status: DC | PRN
  Administered 2021-04-25 (×2): 100 ug

## 2021-04-25 MED ORDER — DEXTROSE IN LACTATED RINGERS 5 % IV SOLN: INTRAVENOUS | Status: AC

## 2021-04-25 MED ORDER — GABAPENTIN 100 MG PO CAPS
200.0000 mg | ORAL_CAPSULE | Freq: Two times a day (BID) | ORAL | Status: DC
  Administered 2021-04-25 – 2021-04-26 (×2): 200 mg via ORAL
  Filled 2021-04-25 (×2): qty 2

## 2021-04-25 MED ORDER — METHYLENE BLUE 0.5 % INJ SOLN
INTRAVENOUS | Status: AC
  Filled 2021-04-25: qty 10

## 2021-04-25 MED ORDER — PHENYLEPHRINE HCL (PRESSORS) 10 MG/ML IV SOLN
INTRAVENOUS | Status: DC | PRN
  Administered 2021-04-25: 80 ug via INTRAVENOUS
  Administered 2021-04-25: 120 ug via INTRAVENOUS

## 2021-04-25 MED ORDER — KETOROLAC TROMETHAMINE 30 MG/ML IJ SOLN
30.0000 mg | Freq: Four times a day (QID) | INTRAMUSCULAR | Status: DC
  Administered 2021-04-25 – 2021-04-26 (×3): 30 mg via INTRAVENOUS
  Filled 2021-04-25 (×3): qty 1

## 2021-04-25 MED ORDER — SUGAMMADEX SODIUM 200 MG/2ML IV SOLN
INTRAVENOUS | Status: DC | PRN
Start: 2021-04-25 — End: 2021-04-25
  Administered 2021-04-25: 129.4 mg via INTRAVENOUS

## 2021-04-25 MED ORDER — MIDAZOLAM HCL 2 MG/2ML IJ SOLN
INTRAMUSCULAR | Status: AC
  Administered 2021-04-25: 2 mg
  Filled 2021-04-25: qty 2

## 2021-04-25 MED ORDER — METHOCARBAMOL 500 MG PO TABS: 500.0000 mg | ORAL_TABLET | Freq: Four times a day (QID) | ORAL | Status: DC | PRN

## 2021-04-25 MED ORDER — FENTANYL CITRATE (PF) 250 MCG/5ML IJ SOLN
INTRAMUSCULAR | Status: DC | PRN
  Administered 2021-04-25 (×2): 50 ug via INTRAVENOUS
  Administered 2021-04-25: 100 ug via INTRAVENOUS
  Administered 2021-04-25: 50 ug via INTRAVENOUS

## 2021-04-25 MED ORDER — DEXAMETHASONE SODIUM PHOSPHATE 10 MG/ML IJ SOLN
INTRAMUSCULAR | Status: DC | PRN
  Administered 2021-04-25 (×2): 10 mg via INTRAVENOUS

## 2021-04-25 MED ORDER — MELATONIN 3 MG PO TABS: 3.0000 mg | ORAL_TABLET | Freq: Every evening | ORAL | Status: DC | PRN

## 2021-04-25 MED ORDER — ACETAMINOPHEN 500 MG PO TABS
1000.0000 mg | ORAL_TABLET | Freq: Four times a day (QID) | ORAL | Status: DC
  Administered 2021-04-25 – 2021-04-26 (×3): 1000 mg via ORAL
  Filled 2021-04-25 (×3): qty 2

## 2021-04-25 MED ORDER — FENTANYL CITRATE (PF) 100 MCG/2ML IJ SOLN: 100.0000 ug | Freq: Once | INTRAMUSCULAR | Status: DC

## 2021-04-25 MED ORDER — PROCHLORPERAZINE MALEATE 10 MG PO TABS
10.0000 mg | ORAL_TABLET | Freq: Four times a day (QID) | ORAL | Status: DC | PRN
  Filled 2021-04-25: qty 1

## 2021-04-25 MED ORDER — MORPHINE SULFATE (PF) 2 MG/ML IV SOLN: 1.0000 mg | INTRAVENOUS | Status: DC | PRN

## 2021-04-25 MED ORDER — DOCUSATE SODIUM 100 MG PO CAPS
100.0000 mg | ORAL_CAPSULE | Freq: Two times a day (BID) | ORAL | Status: DC
  Administered 2021-04-25 – 2021-04-26 (×2): 100 mg via ORAL
  Filled 2021-04-25 (×2): qty 1

## 2021-04-25 MED ORDER — SODIUM CHLORIDE (PF) 0.9 % IJ SOLN
INTRAMUSCULAR | Status: AC
  Filled 2021-04-25: qty 10

## 2021-04-25 MED ORDER — LIDOCAINE HCL (PF) 1 % IJ SOLN
INTRAMUSCULAR | Status: AC
  Filled 2021-04-25: qty 30

## 2021-04-25 MED ORDER — PHENYLEPHRINE HCL-NACL 20-0.9 MG/250ML-% IV SOLN
INTRAVENOUS | Status: DC | PRN
  Administered 2021-04-25: 30 ug/min via INTRAVENOUS

## 2021-04-25 MED ORDER — ORAL CARE MOUTH RINSE: 15.0000 mL | Freq: Once | OROMUCOSAL | Status: AC

## 2021-04-25 MED ORDER — OXYCODONE HCL 5 MG PO TABS: 5.0000 mg | ORAL_TABLET | ORAL | Status: DC | PRN

## 2021-04-25 MED ORDER — DIPHENHYDRAMINE HCL 50 MG/ML IJ SOLN: 12.5000 mg | Freq: Four times a day (QID) | INTRAMUSCULAR | Status: DC | PRN

## 2021-04-25 MED ORDER — ACETAMINOPHEN 500 MG PO TABS
1000.0000 mg | ORAL_TABLET | ORAL | Status: AC
  Administered 2021-04-25: 1000 mg via ORAL
  Filled 2021-04-25: qty 2

## 2021-04-25 SURGICAL SUPPLY — 53 items
ADH SKN CLS APL DERMABOND .7 (GAUZE/BANDAGES/DRESSINGS) ×6
APL PRP STRL LF DISP 70% ISPRP (MISCELLANEOUS) ×3
BAG COUNTER SPONGE SURGICOUNT (BAG) ×4 IMPLANT
BAG SPNG CNTER NS LX DISP (BAG) ×3
BINDER BREAST LRG (GAUZE/BANDAGES/DRESSINGS) IMPLANT
BINDER BREAST XLRG (GAUZE/BANDAGES/DRESSINGS) ×2 IMPLANT
BIOPATCH RED 1 DISK 7.0 (GAUZE/BANDAGES/DRESSINGS) ×6 IMPLANT
BNDG COHESIVE 4X5 TAN STRL (GAUZE/BANDAGES/DRESSINGS) ×4 IMPLANT
CANISTER SUCT 3000ML PPV (MISCELLANEOUS) ×4 IMPLANT
CHLORAPREP W/TINT 26 (MISCELLANEOUS) ×4 IMPLANT
CLIP TI LARGE 6 (CLIP) ×2 IMPLANT
CLIP TI MEDIUM 24 (CLIP) ×2 IMPLANT
CNTNR URN SCR LID CUP LEK RST (MISCELLANEOUS) ×3 IMPLANT
CONT SPEC 4OZ STRL OR WHT (MISCELLANEOUS) ×4
COVER SURGICAL LIGHT HANDLE (MISCELLANEOUS) ×4 IMPLANT
DERMABOND ADVANCED (GAUZE/BANDAGES/DRESSINGS) ×2
DERMABOND ADVANCED .7 DNX12 (GAUZE/BANDAGES/DRESSINGS) ×5 IMPLANT
DRAIN CHANNEL 19F RND (DRAIN) ×6 IMPLANT
DRSG PAD ABDOMINAL 8X10 ST (GAUZE/BANDAGES/DRESSINGS) ×2 IMPLANT
DRSG TEGADERM 4X4.75 (GAUZE/BANDAGES/DRESSINGS) ×6 IMPLANT
ELECT CAUTERY BLADE 6.4 (BLADE) ×4 IMPLANT
ELECT REM PT RETURN 9FT ADLT (ELECTROSURGICAL) ×4
ELECTRODE REM PT RTRN 9FT ADLT (ELECTROSURGICAL) ×3 IMPLANT
EVACUATOR SILICONE 100CC (DRAIN) ×6 IMPLANT
GAUZE SPONGE 4X4 12PLY STRL (GAUZE/BANDAGES/DRESSINGS) ×4 IMPLANT
GLOVE SURG ENC MOIS LTX SZ6 (GLOVE) ×4 IMPLANT
GLOVE SURG UNDER LTX SZ6.5 (GLOVE) ×4 IMPLANT
GOWN STRL REUS W/ TWL LRG LVL3 (GOWN DISPOSABLE) ×3 IMPLANT
GOWN STRL REUS W/TWL 2XL LVL3 (GOWN DISPOSABLE) ×4 IMPLANT
GOWN STRL REUS W/TWL LRG LVL3 (GOWN DISPOSABLE) ×4
KIT BASIN OR (CUSTOM PROCEDURE TRAY) ×4 IMPLANT
KIT TURNOVER KIT B (KITS) ×4 IMPLANT
LIGHT WAVEGUIDE WIDE FLAT (MISCELLANEOUS) ×5 IMPLANT
MARKER SKIN DUAL TIP RULER LAB (MISCELLANEOUS) ×4 IMPLANT
NS IRRIG 1000ML POUR BTL (IV SOLUTION) ×4 IMPLANT
PACK GENERAL/GYN (CUSTOM PROCEDURE TRAY) ×4 IMPLANT
PACK UNIVERSAL I (CUSTOM PROCEDURE TRAY) ×4 IMPLANT
PAD ARMBOARD 7.5X6 YLW CONV (MISCELLANEOUS) ×4 IMPLANT
PENCIL SMOKE EVACUATOR (MISCELLANEOUS) ×4 IMPLANT
SPECIMEN JAR X LARGE (MISCELLANEOUS) ×4 IMPLANT
STAPLER VISISTAT 35W (STAPLE) ×1 IMPLANT
STOCKINETTE IMPERVIOUS 9X36 MD (GAUZE/BANDAGES/DRESSINGS) ×2 IMPLANT
STRIP CLOSURE SKIN 1/2X4 (GAUZE/BANDAGES/DRESSINGS) ×5 IMPLANT
SUT ETHILON 2 0 FS 18 (SUTURE) ×8 IMPLANT
SUT MON AB 4-0 PC3 18 (SUTURE) ×5 IMPLANT
SUT SILK 2 0 (SUTURE) ×4
SUT SILK 2 0 PERMA HAND 18 BK (SUTURE) ×4 IMPLANT
SUT SILK 2-0 18XBRD TIE 12 (SUTURE) ×3 IMPLANT
SUT VIC AB 3-0 SH 27 (SUTURE) ×4
SUT VIC AB 3-0 SH 27X BRD (SUTURE) IMPLANT
SUT VIC AB 3-0 SH 8-18 (SUTURE) ×6 IMPLANT
TOWEL GREEN STERILE (TOWEL DISPOSABLE) ×4 IMPLANT
TOWEL GREEN STERILE FF (TOWEL DISPOSABLE) ×4 IMPLANT

## 2021-04-25 NOTE — Anesthesia Preprocedure Evaluation (Signed)
Anesthesia Evaluation  Patient identified by MRN, date of birth, ID band Patient awake    Reviewed: Allergy & Precautions, NPO status , Patient's Chart, lab work & pertinent test results  Airway Mallampati: I       Dental no notable dental hx.    Pulmonary neg pulmonary ROS,    Pulmonary exam normal        Cardiovascular negative cardio ROS Normal cardiovascular exam     Neuro/Psych negative neurological ROS     GI/Hepatic negative GI ROS, Neg liver ROS,   Endo/Other  negative endocrine ROS  Renal/GU negative Renal ROS  negative genitourinary   Musculoskeletal negative musculoskeletal ROS (+)   Abdominal Normal abdominal exam  (+)   Peds  Hematology   Anesthesia Other Findings   Reproductive/Obstetrics                             Anesthesia Physical Anesthesia Plan  ASA: 2  Anesthesia Plan: General   Post-op Pain Management:  Regional for Post-op pain   Induction:   PONV Risk Score and Plan: 3 and Ondansetron, Dexamethasone and Midazolam  Airway Management Planned: Oral ETT  Additional Equipment: None  Intra-op Plan:   Post-operative Plan: Extubation in OR  Informed Consent: I have reviewed the patients History and Physical, chart, labs and discussed the procedure including the risks, benefits and alternatives for the proposed anesthesia with the patient or authorized representative who has indicated his/her understanding and acceptance.     Dental advisory given  Plan Discussed with: CRNA  Anesthesia Plan Comments:         Anesthesia Quick Evaluation

## 2021-04-25 NOTE — Anesthesia Procedure Notes (Signed)
Anesthesia Regional Block: Pectoralis block   Pre-Anesthetic Checklist: , timeout performed,  Correct Patient, Correct Site, Correct Laterality,  Correct Procedure, Correct Position, site marked,  Risks and benefits discussed,  Surgical consent,  Pre-op evaluation,  At surgeon's request and post-op pain management  Laterality: Right and N/A  Prep: chloraprep       Needles:  Injection technique: Single-shot  Needle Type: Echogenic Stimulator Needle     Needle Length: 9cm  Needle Gauge: 20   Needle insertion depth: 2 cm   Additional Needles:   Procedures:,,,, ultrasound used (permanent image in chart),,    Narrative:  Start time: 04/25/2021 9:20 AM End time: 04/25/2021 9:26 AM Injection made incrementally with aspirations every 5 mL.  Performed by: Personally  Anesthesiologist: Lyn Hollingshead, MD

## 2021-04-25 NOTE — Op Note (Signed)
Bilateral Mastectomies with right axillary lymph node dissection, removal of bilateral retropectoral breast implants  Indications: This patient presents with history of right breast cancer, UOQ, cT2N2, grade 3 invasive ductal carcinoma, +/+/-, s/p neoadjuvant chemotherapy.  She has family history of breast and ovarian cancer and despite negative genetics, still has a higher risk of development of contralateral breast cancer and desired contralateral mastectomy for symmetry and prevention. She declined reconstruction and desired to have implants removed.    Pre-operative Diagnosis: right breast cancer as described above  Post-operative Diagnosis: same  Surgeon: Stark Klein, MD  Assistant: Pryor Curia, RNFA  Anesthesia: General endotracheal anesthesia and pectoral block  ASA Class: 2  Procedure Details  The patient was seen in the Holding Room. The risks, benefits, complications, treatment options, and expected outcomes were discussed with the patient. The possibilities of reaction to medication, pulmonary aspiration, bleeding, infection, the need for additional procedures, failure to diagnose a condition, and creating a complication requiring transfusion or operation were discussed with the patient. The patient concurred with the proposed plan, giving informed consent.  The site of surgery properly noted/marked. The patient was taken to Operating Room # 2, identified as Melissa Mcgrath and the procedure verified as Bilateral Mastectomy and right axillary lymph node dissection. A Time Out was held and the above information confirmed.  After induction of anesthesia, the bilateral breast and chest as well as the right arm were prepped and draped in standard fashion.  The left side was addressed first.   The borders of the breast were identified and marked.  The incisions of the breast were drawn out to make sure incision lines were equidistant in length.    The superior incision was made with  the #10 blade.  Mastectomy hooks were used to provide elevation of the skin edges, and the cautery was used to create the mastectomy flaps.  The dissection was taken to the fascia of the pectoralis major.  The penetrating vessels were clipped as needed.  The superior flap was taken medially to the lateral sternal border, superiorly to the inferior border of the clavicle.  The inferior flap was similarly created, inferiorly to the inframammary fold and laterally to the border of the latissimus.  The breast was taken off including the pectoralis fascia and the axillary tail marked.  The implant capsule was opened below the pectoralis border and the implant was removed and passed off.  The right side was addressed similarly for the breast and the implant.  The implant on the right had thinned the medial aspect of the pectoralis to the extent that it had a defect.  This was closed back with running 2-0 vicryl.    A right axillary dissection was performed with removal of the associated lymph nodes and surrounding adipose tissue. This included levels I and II. This was accomplished by exposing the axillary vein anteriorly and inferiorly to the level of the pectoralis minor and laterally over the latissimus dorsi muscle. Posteriorly, the dissection continued to the subscapularis.  Small venous tributaries, lymphatics, and vessels were clipped and ligated or cauterized and divided. The subscapularis muscle was skeletonized. The long thoracic and thoracodorsal neurovascular bundles were identified and preserved prior to division of any structures.          The wound was irrigated. One 32 Blake drain was placed laterally on the left and two were placed on the right with the lateral one in the axilla.   Hemostasis was achieved with cautery.  The wounds  were closed with a 3-0 Vicryl deep dermal interrupted sutures and 4-0 monocryl subcuticular closure in layers.    Sterile dressings were applied. At the end of the  operation, all sponge, instrument, and needle counts were correct.  Findings: grossly clear surgical margins, no residual palpable tumor  Estimated Blood Loss: 50 mL          Drains: 19 Fr blake drain in right axilla (lateral right), 19 Fr in left chest incision (right medial) and 19 Fr on left in chest incision                Specimens: left breast, left breast implant, right breast, right breast implant and right axillary contents         Complications:  None; patient tolerated the procedure well.         Disposition: PACU - hemodynamically stable.         Condition: stable

## 2021-04-25 NOTE — H&P (Signed)
Chief Complaint: Breast Cancer   History of Present Illness: Pt is a lovely 60 yo F who presents with a diagnosis of right breast cancer 08/21/2020.  She had a palpable mass for several months and thought it was probably related to her implants (20 years go by [employer], saline, retropectoral).  It seemed to be more prominent and she talked to Dr. Helane Rima who sent her to get diagnostic imaging.  She was seen to have a 2.1 cm mass at 9 o'clock on the right with a small satellite mass adjacent to it.  There were 3 abnormal appearing nodes as well.  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  The node was also positive.    She is interested in getting the implants removed and having bilateral mastectomies, +/- reconstruction.    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  She is quite active wtih riding and caring for the animals, but has employee that does almost all the cleaning and heavy lifting.    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  She had a mother that had metastatic breast cancer at age 45 and a maternal grandmother wtih pancreatic cancer.  Maternal aunt also had breast cancer age 15.  She had menarche at age 21, and menopause age 82.  She used HRT for around a year.  She is a Advertising copywriter and used hormonal contraception for around 20 years.  She hasn't had a colonoscopy or bone density study, but has had annual mammography.    Imaging is from [facility] 08/20[facility].  Films and reports are reviewed in multidisciplinary fashion and are described above.  pathology also described above, dated 08/21/2020 and from [place].    Interval history: She completed her 4 cycles of cyclophosphamide and doxorubicin 11/15/2020 and received an extra week to recover since those treatments were really rough on her.     She then received weekly Paclitaxel x 11. 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 She has had carpal tunnel surgery on this hand. She denies any numbness or tingling elsewhere in her fingertips or toes.     She had follow up MRI that showed near complete imaging response. She cannot feel her tumor anymore.   MR 03/15/21 IMPRESSION: 1. Positive imaging response to chemotherapy with significant interval decrease in size of the biopsy-proven malignancy over the 9 o'clock position of the right breast. 2. Significant interval decrease in size of multiple small nodes over the axillary tail portion of the right breast extending to the right axilla. This includes the biopsy-proven metastatic axillary tail lymph node containing clip artifact. 3. Normal left breast.   RECOMMENDATION: Recommend continued follow-up per clinical treatment plan.   BI-RADS CATEGORY 6: Known biopsy-proven malignancy.      Review of Systems: A complete review of systems was obtained from the patient. I have reviewed this information and discussed as appropriate with the patient. See HPI as well for other ROS.  Review of Systems  Respiratory: Positive for shortness of breath.  All other systems reviewed and are negative.   Medical History: Past Medical History:  Diagnosis Date   History of cancer   Patient Active Problem List  Diagnosis   Malignant neoplasm of upper-outer quadrant of right breast in  female, estrogen receptor positive (CMS-HCC)   Family history of breast cancer   Past Surgical History:  Procedure Laterality Date   Breast Augmentation (bilateral) 09/05/1999   Carpal Tunnel (Right) 02/2011   Port-A-Cath Placement 09/17/2020  Dr. Barry Dienes    No Known Allergies  Current Outpatient Medications on File Prior to Visit  Medication Sig Dispense Refill   multivitamin (MULTIVITAMIN) tablet Take by mouth.   No current facility-administered medications on file  prior to visit.   Family History  Problem Relation Age of Onset   Breast cancer Mother   High blood pressure (Hypertension) Father   Coronary Artery Disease (Blocked arteries around heart) Father    Social History   Tobacco Use  Smoking Status Never Smoker  Smokeless Tobacco Never Used    Social History   Socioeconomic History   Marital status: Unknown  Tobacco Use   Smoking status: Never Smoker   Smokeless tobacco: Never Used  Substance and Sexual Activity   Alcohol use: No   Drug use: Never   Sexual activity: Defer   Objective:   Vitals:  BP: 112/64  Pulse: 98  Temp: 36.6 C (97.9 F)  SpO2: 98%  Weight: 64 kg (141 lb 3.2 oz)  Height: 172.7 cm (5\' 8" )   Body mass index is 21.47 kg/m.  Head: Normocephalic and atraumatic.  Eyes: Conjunctivae are normal. Pupils are equal, round, and reactive to light. No scleral icterus.  Neck: Normal range of motion. Neck supple. No tracheal deviation present. No thyromegaly present.  Resp: No respiratory distress, normal effort. Breast:no palpable masses, no palpable LAD either side. Implants in place Abd: Abdomen is soft, non distended and non tender. No masses are palpable. There is no rebound and no guarding.  Neurological: Alert and oriented to person, place, and time. Coordination normal.  Skin: Skin is warm and dry. No rash noted. No diaphoretic. No erythema. No pallor.  Psychiatric: Normal mood and affect. Normal behavior. Judgment and thought content normal.   Assessment and Plan:  Diagnoses and all orders for this visit:  Malignant neoplasm of upper-outer quadrant of right breast in female, estrogen receptor positive (CMS-HCC)  Family history of breast cancer   I plan bilateral mastectomies with right axillary lymph node dissection. Implants will be removed as well.   I discussed risks of surgery including bleeding, infection, chronic pain and/or numbness, damage to adjacent structures, heart or lung issues,  dissatisfaction with scars, blood clot and more.  We will plan this as soon as possible. She does not want reconstruction.   Return for for surgery.  Georgianne Fick, MD

## 2021-04-25 NOTE — Transfer of Care (Signed)
Immediate Anesthesia Transfer of Care Note  Patient: Melissa Mcgrath  Procedure(s) Performed: RIGHT TOTAL MASTECTOMY WITH RIGHT AXILLARY LYMPH NODE DISSECTION (Right: Breast) REMOVAL BREAST IMPLANTS (Bilateral: Breast) LEFT TOTAL MASTECTOMY (Left: Breast)  Patient Location: PACU  Anesthesia Type:General and Regional  Level of Consciousness: awake, drowsy and patient cooperative  Airway & Oxygen Therapy: Patient Spontanous Breathing and Patient connected to nasal cannula oxygen  Post-op Assessment: Report given to RN, Post -op Vital signs reviewed and stable and Patient moving all extremities X 4  Post vital signs: Reviewed and stable  Last Vitals:  Vitals Value Taken Time  BP 117/66 04/25/21 1312  Temp    Pulse 80 04/25/21 1312  Resp 15 04/25/21 1312  SpO2 100 % 04/25/21 1312  Vitals shown include unvalidated device data.  Last Pain:  Vitals:   04/25/21 0723  TempSrc:   PainSc: 0-No pain         Complications: No notable events documented.

## 2021-04-25 NOTE — Anesthesia Procedure Notes (Addendum)
Anesthesia Regional Block: Pectoralis block   Pre-Anesthetic Checklist: , timeout performed,  Correct Patient, Correct Site, Correct Laterality,  Correct Procedure, Correct Position, site marked,  Risks and benefits discussed,  Surgical consent,  Pre-op evaluation,  At surgeon's request and post-op pain management  Laterality: Left and N/A  Prep: chloraprep       Needles:  Injection technique: Single-shot  Needle Type: Echogenic Stimulator Needle     Needle Length: 9cm  Needle Gauge: 20   Needle insertion depth: 2 cm   Additional Needles:   Procedures:,,,, ultrasound used (permanent image in chart),,    Narrative:  Start time: 04/25/2021 9:10 AM End time: 04/25/2021 9:16 AM Injection made incrementally with aspirations every 5 mL.  Performed by: Personally  Anesthesiologist: Lyn Hollingshead, MD

## 2021-04-25 NOTE — Anesthesia Procedure Notes (Signed)
Procedure Name: Intubation Date/Time: 04/25/2021 10:05 AM Performed by: Annamary Carolin, CRNA Pre-anesthesia Checklist: Patient identified, Emergency Drugs available, Suction available and Patient being monitored Patient Re-evaluated:Patient Re-evaluated prior to induction Oxygen Delivery Method: Circle System Utilized Preoxygenation: Pre-oxygenation with 100% oxygen Induction Type: IV induction Ventilation: Mask ventilation without difficulty Laryngoscope Size: Glidescope and 3 Tube type: Oral Tube size: 7.0 mm Number of attempts: 3 Airway Equipment and Method: Stylet Placement Confirmation: ETT inserted through vocal cords under direct vision, positive ETCO2 and breath sounds checked- equal and bilateral Secured at: 21 cm Tube secured with: Tape Dental Injury: Teeth and Oropharynx as per pre-operative assessment  Comments: DL MAC 3 and Miller 2 - anterrior, ETT would not pass with ease. GS #3 - ETT passed with ease.

## 2021-04-25 NOTE — Interval H&P Note (Signed)
History and Physical Interval Note:  04/25/2021 9:09 AM  Melissa Mcgrath  has presented today for surgery, with the diagnosis of RIGHT BREAST CANCER.  The various methods of treatment have been discussed with the patient and family. After consideration of risks, benefits and other options for treatment, the patient has consented to  Procedure(s): BILATERAL TOTAL MASTECTOMY WITH RIGHT AXILLARY LYMPH NODE DISSECTION (Bilateral) REMOVAL BREAST IMPLANTS (Bilateral) as a surgical intervention.  The patient's history has been reviewed, patient examined, no change in status, stable for surgery.  I have reviewed the patient's chart and labs.  Questions were answered to the patient's satisfaction.     Stark Klein

## 2021-04-25 NOTE — Anesthesia Postprocedure Evaluation (Signed)
Anesthesia Post Note  Patient: Melissa Mcgrath  Procedure(s) Performed: RIGHT TOTAL MASTECTOMY WITH RIGHT AXILLARY LYMPH NODE DISSECTION (Right: Breast) REMOVAL BREAST IMPLANTS (Bilateral: Breast) LEFT TOTAL MASTECTOMY (Left: Breast)     Patient location during evaluation: PACU Anesthesia Type: General Level of consciousness: awake Pain management: pain level controlled Vital Signs Assessment: post-procedure vital signs reviewed and stable Cardiovascular status: stable Postop Assessment: no apparent nausea or vomiting Anesthetic complications: no   No notable events documented.  Last Vitals:  Vitals:   04/25/21 1345 04/25/21 1415  BP: (!) 118/47 106/61  Pulse: 69 70  Resp: (!) 9 11  Temp:  (!) 36.3 C  SpO2: 100% 100%    Last Pain:  Vitals:   04/25/21 1415  TempSrc:   PainSc: [place] Jr

## 2021-04-25 NOTE — Progress Notes (Signed)
Patient alert and oriented during bilateral nerve block. SBP in low 100, Pulse 60s-70s, RR 15-20; Sat O2 100% -  [place] 2L. Unable to document VS - patient left to OR immediate after nerve block and CRNA turned off the monitor.

## 2021-04-25 NOTE — Discharge Instructions (Signed)
[facility] [place] [phone]  MASTECTOMY: POST OP INSTRUCTIONS  Always review your discharge instruction sheet given to you by the facility where your surgery was performed. IF YOU HAVE DISABILITY OR FAMILY LEAVE FORMS, YOU MUST BRING THEM TO THE OFFICE FOR PROCESSING.   DO NOT GIVE THEM TO YOUR DOCTOR. A prescription for pain medication may be given to you upon discharge.  Take your pain medication as prescribed, if needed.  If narcotic pain medicine is not needed, then you may take acetaminophen (Tylenol) or ibuprofen (Advil) as needed. Take your usually prescribed medications unless otherwise directed. If you need a refill on your pain medication, please contact your pharmacy.  They will contact our office to request authorization.  Prescriptions will not be filled after 5pm or on week-ends. You should follow a light diet the first few days after arrival home, such as soup and crackers, etc.  Resume your normal diet the day after surgery. Most patients will experience some swelling and bruising on the chest and underarm.  Ice packs will help.  Swelling and bruising can take several days to resolve.  It is common to experience some constipation if taking pain medication after surgery.  Increasing fluid intake and taking a stool softener (such as Colace) will usually help or prevent this problem from occurring.  A mild laxative (Milk of Magnesia or Miralax) should be taken according to package instructions if there are no bowel movements after 48 hours. Unless discharge instructions indicate otherwise, leave your bandage dry and in place until your next appointment in 3-5 days.  You may take a limited sponge bath.  No tube baths or showers until the drains are removed.  You may have steri-strips (small skin tapes) in place directly over the incision.  These strips should be left on the skin for 7-10 days.  If your surgeon used skin glue on the incision, you may shower in 24 hours.   The glue will flake off over the next 2-3 weeks.  Any sutures or staples will be removed at the office during your follow-up visit. DRAINS:  If you have drains in place, it is important to keep a list of the amount of drainage produced each day in your drains.  Before leaving the hospital, you should be instructed on drain care.  Call our office if you have any questions about your drains. ACTIVITIES:  You may resume regular (light) daily activities beginning the next day--such as daily self-care, walking, climbing stairs--gradually increasing activities as tolerated.  You may have sexual intercourse when it is comfortable.  Refrain from any heavy lifting or straining until approved by your doctor. You may drive when you are no longer taking prescription pain medication, you can comfortably wear a seatbelt, and you can safely maneuver your car and apply brakes. RETURN TO WORK:  __________________________________________________________ You should see your doctor in the office for a follow-up appointment approximately 3-5 days after your surgery.  Your doctor's nurse will typically make your follow-up appointment when she calls you with your pathology report.  Expect your pathology report 2-3 business days after your surgery.  You may call to check if you do not hear from us after three days.   OTHER INSTRUCTIONS: ______________________________________________________________________________________________ ____________________________________________________________________________________________ WHEN TO CALL YOUR DOCTOR: Fever over 101.0 Nausea and/or vomiting Extreme swelling or bruising Continued bleeding from incision. Increased pain, redness, or drainage from the incision. The clinic staff is available to answer your questions during regular business hours.  Please don't hesitate   to call and ask to speak to one of the nurses for clinical concerns.  If you have a medical emergency, go to the  nearest emergency room or call 911.  A surgeon from [facility] is always on call at the hospital. [address], [address], [place], [place]  [address] ? [address] [address], [place], [place]   [address] [phone] ? [phone] ? FAX [phone] Web site: [email or web address]  

## 2021-04-26 ENCOUNTER — Encounter (HOSPITAL_COMMUNITY): Payer: Self-pay | Admitting: General Surgery

## 2021-04-26 DIAGNOSIS — C50411 Malignant neoplasm of upper-outer quadrant of right female breast: Secondary | ICD-10-CM | POA: Diagnosis not present

## 2021-04-26 LAB — BASIC METABOLIC PANEL
Anion gap: 11 (ref 5–15)
BUN: 15 mg/dL (ref 6–20)
CO2: 19 mmol/L — ABNORMAL LOW (ref 22–32)
Calcium: 8.7 mg/dL — ABNORMAL LOW (ref 8.9–10.3)
Chloride: 107 mmol/L (ref 98–111)
Creatinine, Ser: 0.86 mg/dL (ref 0.44–1.00)
GFR, Estimated: >60 mL/min (ref >60)
Glucose, Bld: 219 mg/dL — ABNORMAL HIGH (ref 70–99)
Potassium: 4.4 mmol/L (ref 3.5–5.1)
Sodium: 137 mmol/L (ref 135–145)

## 2021-04-26 LAB — CBC
HCT: 31.9 % — ABNORMAL LOW (ref 36.0–46.0)
Hemoglobin: 10.6 g/dL — ABNORMAL LOW (ref 12.0–15.0)
MCH: 32.2 pg (ref 26.0–34.0)
MCHC: 33.2 g/dL (ref 30.0–36.0)
MCV: 97 fL (ref 80.0–100.0)
Platelets: 171 10*3/uL (ref 150–400)
RBC: 3.29 MIL/uL — ABNORMAL LOW (ref 3.87–5.11)
RDW: 12.6 % (ref 11.5–15.5)
WBC: 10.7 10*3/uL — ABNORMAL HIGH (ref 4.0–10.5)
nRBC: 0.2 % (ref 0.0–0.2)

## 2021-04-26 MED ORDER — GABAPENTIN 100 MG PO CAPS
200.0000 mg | ORAL_CAPSULE | Freq: Two times a day (BID) | ORAL | 0 refills | Status: DC
Start: 2021-04-26 — End: 2021-07-10

## 2021-04-26 MED ORDER — OXYCODONE HCL 5 MG PO TABS: 2.5000 mg | ORAL_TABLET | ORAL | 0 refills | Status: DC | PRN

## 2021-04-26 MED ORDER — METHOCARBAMOL 500 MG PO TABS
500.0000 mg | ORAL_TABLET | Freq: Four times a day (QID) | ORAL | 1 refills | Status: DC | PRN
Start: 2021-04-26 — End: 2021-07-10

## 2021-04-26 MED ORDER — ACETAMINOPHEN 500 MG PO TABS: 1000.0000 mg | ORAL_TABLET | Freq: Four times a day (QID) | ORAL | 0 refills | Status: DC

## 2021-04-26 NOTE — Discharge Summary (Signed)
Physician Discharge Summary  Patient ID: Melissa Mcgrath MRN: [id number] DOB/AGE: 1960-11-22 60 y.o.  Admit date: 04/25/2021 Discharge date: 04/26/2021  Admission Diagnoses: Right breast cancer Family history of breast cancer  Discharge Diagnoses:  Active Problems:   Breast cancer metastasized to axillary lymph node, right [facility])   Discharged Condition: stable  Hospital Course:  Patient was admitted to the floor following bilateral mastectomies with right axillary lymph node dissection.  She had some nausea initially, but this resolved.  She did not require any narcotics overnight.  Drain output was serosanguinous and low volume.  She was able to move around and void spontaneously.  Pt received drain teaching.    Consults: None  Significant Diagnostic Studies: labs: HCT 31.9 prior to d/c  Treatments: surgery: see above  Discharge Exam: Blood pressure (!) 102/55, pulse 62, temperature 98.4 F (36.9 C), temperature source Oral, resp. rate 15, height 5\' 8"  (1.727 m), weight 64.7 kg, SpO2 98 %. General appearance: alert, cooperative, and no distress Chest wall: bilateral chest wall tenderness as expected Extremities: extremities normal, atraumatic, no cyanosis or edema  Disposition: Discharge disposition: 01-Home or Self Care       Discharge Instructions     Call MD for:  difficulty breathing, headache or visual disturbances   Complete by: As directed    Call MD for:  hives   Complete by: As directed    Call MD for:  persistant nausea and vomiting   Complete by: As directed    Call MD for:  redness, tenderness, or signs of infection (pain, swelling, redness, odor or green/yellow discharge around incision site)   Complete by: As directed    Call MD for:  severe uncontrolled pain   Complete by: As directed    Call MD for:  temperature >100.4   Complete by: As directed    Change dressing (specify)   Complete by: As directed    Measure and record drain output at least  once daily and bring record to clinic.   Diet - low sodium heart healthy   Complete by: As directed    Increase activity slowly   Complete by: As directed       Allergies as of 04/26/2021   No Known Allergies      Medication List     TAKE these medications    acetaminophen 500 MG tablet Commonly known as: TYLENOL Take 2 tablets (1,000 mg total) by mouth every 6 (six) hours.   docusate sodium 100 MG capsule Commonly known as: COLACE TAKE 2 CAPSULES BY MOUTH TWICE DAILY AS NEEDED FOR CONSTIPATION   gabapentin 100 MG capsule Commonly known as: NEURONTIN Take 2 capsules (200 mg total) by mouth 2 (two) times daily.   lidocaine-prilocaine cream Commonly known as: EMLA Apply 1 application topically as needed.   loratadine 10 MG tablet Commonly known as: Claritin Take 1 tablet (10 mg total) by mouth daily.   methocarbamol 500 MG tablet Commonly known as: ROBAXIN Take 1 tablet (500 mg total) by mouth every 6 (six) hours as needed for muscle spasms.   oxyCODONE 5 MG immediate release tablet Commonly known as: Oxy IR/ROXICODONE Take 0.5-1 tablets (2.5-5 mg total) by mouth every 4 (four) hours as needed for moderate pain.   triamcinolone lotion 0.1 % Commonly known as: KENALOG Apply 1 application topically 3 (three) times daily.   valACYclovir 1000 MG tablet Commonly known as: VALTREX Take 1 tablet (1,000 mg total) by mouth daily.  Discharge Care Instructions  (From admission, onward)           Start     Ordered   04/26/21 0000  Change dressing (specify)       Comments: Measure and record drain output at least once daily and bring record to clinic.   04/26/21 0857            Follow-up Information     Stark Klein, MD Follow up in 2 week(s).   Specialty: General Surgery Contact information: [address] [place] [address] [phone]                 Signed: Stark Klein 04/26/2021, 10:11 AM

## 2021-05-01 ENCOUNTER — Inpatient Hospital Stay: Payer: No Typology Code available for payment source

## 2021-05-01 ENCOUNTER — Encounter: Payer: Self-pay | Admitting: *Deleted

## 2021-05-01 ENCOUNTER — Inpatient Hospital Stay: Payer: No Typology Code available for payment source | Attending: Adult Health

## 2021-05-01 ENCOUNTER — Inpatient Hospital Stay (HOSPITAL_BASED_OUTPATIENT_CLINIC_OR_DEPARTMENT_OTHER): Payer: No Typology Code available for payment source | Admitting: Oncology

## 2021-05-01 ENCOUNTER — Other Ambulatory Visit: Payer: Self-pay

## 2021-05-01 VITALS — BP 156/94 | HR 87 | Temp 97.6°F | Resp 18 | Ht 68.0 in | Wt 141.4 lb

## 2021-05-01 DIAGNOSIS — C50411 Malignant neoplasm of upper-outer quadrant of right female breast: Secondary | ICD-10-CM | POA: Diagnosis not present

## 2021-05-01 DIAGNOSIS — C773 Secondary and unspecified malignant neoplasm of axilla and upper limb lymph nodes: Secondary | ICD-10-CM | POA: Diagnosis not present

## 2021-05-01 DIAGNOSIS — Z9221 Personal history of antineoplastic chemotherapy: Secondary | ICD-10-CM | POA: Diagnosis not present

## 2021-05-01 DIAGNOSIS — Z95828 Presence of other vascular implants and grafts: Secondary | ICD-10-CM

## 2021-05-01 DIAGNOSIS — Z170 Estrogen receptor positive status [ER+]: Secondary | ICD-10-CM | POA: Insufficient documentation

## 2021-05-01 DIAGNOSIS — Z9013 Acquired absence of bilateral breasts and nipples: Secondary | ICD-10-CM | POA: Diagnosis not present

## 2021-05-01 DIAGNOSIS — C50911 Malignant neoplasm of unspecified site of right female breast: Secondary | ICD-10-CM

## 2021-05-01 DIAGNOSIS — G629 Polyneuropathy, unspecified: Secondary | ICD-10-CM | POA: Diagnosis not present

## 2021-05-01 LAB — CBC WITH DIFFERENTIAL/PLATELET
Abs Immature Granulocytes: 0.02 10*3/uL (ref 0.00–0.07)
Basophils Absolute: 0.1 10*3/uL (ref 0.0–0.1)
Basophils Relative: 1 %
Eosinophils Absolute: 0.4 10*3/uL (ref 0.0–0.5)
Eosinophils Relative: 8 %
HCT: 34.2 % — ABNORMAL LOW (ref 36.0–46.0)
Hemoglobin: 11.3 g/dL — ABNORMAL LOW (ref 12.0–15.0)
Immature Granulocytes: 0 %
Lymphocytes Relative: 22 %
Lymphs Abs: 1.2 10*3/uL (ref 0.7–4.0)
MCH: 31.5 pg (ref 26.0–34.0)
MCHC: 33 g/dL (ref 30.0–36.0)
MCV: 95.3 fL (ref 80.0–100.0)
Monocytes Absolute: 0.6 10*3/uL (ref 0.1–1.0)
Monocytes Relative: 11 %
Neutro Abs: 3.1 10*3/uL (ref 1.7–7.7)
Neutrophils Relative %: 58 %
Platelets: 224 10*3/uL (ref 150–400)
RBC: 3.59 MIL/uL — ABNORMAL LOW (ref 3.87–5.11)
RDW: 12.4 % (ref 11.5–15.5)
WBC: 5.3 10*3/uL (ref 4.0–10.5)
nRBC: 0 % (ref 0.0–0.2)

## 2021-05-01 LAB — COMPREHENSIVE METABOLIC PANEL
ALT: 28 U/L (ref 0–44)
AST: 20 U/L (ref 15–41)
Albumin: 3.4 g/dL — ABNORMAL LOW (ref 3.5–5.0)
Alkaline Phosphatase: 69 U/L (ref 38–126)
Anion gap: 10 (ref 5–15)
BUN: 14 mg/dL (ref 6–20)
CO2: 24 mmol/L (ref 22–32)
Calcium: 9 mg/dL (ref 8.9–10.3)
Chloride: 107 mmol/L (ref 98–111)
Creatinine, Ser: 0.93 mg/dL (ref 0.44–1.00)
GFR, Estimated: >60 mL/min (ref >60)
Glucose, Bld: 93 mg/dL (ref 70–99)
Potassium: 4 mmol/L (ref 3.5–5.1)
Sodium: 141 mmol/L (ref 135–145)
Total Bilirubin: 0.2 mg/dL — ABNORMAL LOW (ref 0.3–1.2)
Total Protein: 6.6 g/dL (ref 6.5–8.1)

## 2021-05-01 MED ORDER — HEPARIN SOD (PORK) LOCK FLUSH 100 UNIT/ML IV SOLN
500.0000 [IU] | Freq: Once | INTRAVENOUS | Status: AC
  Administered 2021-05-01: 500 [IU]

## 2021-05-01 MED ORDER — SODIUM CHLORIDE 0.9% FLUSH
10.0000 mL | Freq: Once | INTRAVENOUS | Status: AC
  Administered 2021-05-01: 10 mL

## 2021-05-01 NOTE — Progress Notes (Signed)
[place]  [phone] [phone]    ID: Melissa Mcgrath DOB: [birth year]  MR#: [id number]  ION#:[id number]  Patient Care Team: Pablo Lawrence, NP as PCP - General (Adult Health Nurse Practitioner) Mauro Kaufmann, RN as Oncology Nurse Navigator Rockwell Germany, RN as Oncology Nurse Navigator Keyry Iracheta, Virgie Dad, MD as Consulting Physician (Oncology) Stark Klein, MD as Consulting Physician (General Surgery) Gery Pray, MD as Consulting Physician (Radiation Oncology) Dian Queen, MD as Consulting Physician (Obstetrics and Gynecology) Ulla Gallo, MD as Consulting Physician (Dermatology) Chauncey Cruel, MD OTHER MD:   CHIEF COMPLAINT: Estrogen receptor positive breast cancer  CURRENT TREATMENT: Adjuvant radiation pending   INTERVAL HISTORY: Melissa Mcgrath returns today for follow up and treatment of her estrogen receptor positive breast cancer.  She is accompanied by her sister.  Since her last visit, she underwent breast MRI on 03/15/2021 showing: breast composition B; positive imaging response to chemotherapy with significant interval decrease in size of biopsy-proven malignancy in right breast and in size of multiple small nodes over the axillary tail; normal left breast.  She opted to proceed with bilateral mastectomies (and bilateral implant removal) on 04/25/2021 under Dr. Barry Dienes. Pathology from the procedure ([id number]) showed:  A. BREAST, LEFT, MASTECTOMY:  - Mild fibrocystic change  - Benign intramammary lymph nodes  - Negative for carcinoma   B. BREAST, RIGHT, MASTECTOMY:  - Invasive ductal carcinoma, 1.6 cm, grade 3  - Ductal carcinoma in situ, high-grade  - Deep margin is focally positive for invasive carcinoma  - Perineural invasion is present  - Benign intramammary lymph nodes (0/2)  - Skeletal muscle is present, negative for carcinoma  - See oncology table   C. LYMPH NODE, RIGHT AXILLARY CONTENTS, BIOPSY:  -  Twenty-four benign lymph nodes, negative for carcinoma (0/24)   REVIEW OF SYSTEMS: Melissa Mcgrath did well with her surgery, with no unusual bleeding or pain.  She is only using Tylenol as far as that goes.  She has not had fever or unusual bleeding.  She still does have her drains in place.  They are still draining about 25 cc a day.  She will be seeing Dr. Barry Dienes later this week and hopes possibly they could be removed then or shortly thereafter.  She is eating better and has regained her weight.  Her hair has come in strong and curly and she is getting very positive comments on it.  However she "does not recommend" the "salon".   COVID 19 VACCINATION STATUS: [place] x2, most recently 01/2020, no booster as of 08/29/2020   HISTORY OF CURRENT ILLNESS: From the original intake note:  Melissa Mcgrath herself palpated an upper-outer right breast lump and noted associated intermittent pain and itching. She underwent bilateral diagnostic mammography with tomography and right breast ultrasonography at [facility] on 08/20/2020 showing: breast density category B; palpable 2.1 cm irregular mass in right breast at 9 o'clock; three abnormal-appearing right axillary tail nodes; indeterminate 4 mm oval mass located 1.8 cm lateral to dominant mass.  Accordingly on 08/21/2020 she proceeded to biopsy of the right breast area in question. The pathology from this procedure ([id number]) showed: invasive mammary carcinoma, e-cadherin positive, grade 3. Prognostic indicators significant for: estrogen receptor, 90% positive with strong staining intensity and progesterone receptor, 2% positive with weak staining intensity. Proliferation marker Ki67 at 40%. HER2 equivocal by immunohistochemistry (2+), but negative by fluorescent in situ hybridization (signals ratio and number per cell not available today).  The biopsied lymph node in  the right axillary tail also showed invasive ductal carcinoma. No lymph node tissue was identified, possibly  representing an entirely replaced lymph node.  Cancer Staging Malignant neoplasm of upper-outer quadrant of right breast in female, estrogen receptor positive ([place]) Staging form: Breast, AJCC 8th Edition - Clinical stage from 08/29/2020: Stage IIIA (cT2, cN2, cM0, G3, ER+, PR+, HER2-) - Signed by Gardenia Phlegm, NP on 09/07/2020 Stage prefix: Initial diagnosis Stage used in treatment planning: Yes National guidelines used in treatment planning: Yes Type of national guideline used in treatment planning: NCCN  The patient's subsequent history is as detailed below.   PAST MEDICAL HISTORY: Past Medical History:  Diagnosis Date   Anemia    Depression 01/05/2014   Facial basal cell cancer    Family history of breast cancer    Family history of melanoma    Family history of ovarian cancer    Family history of pancreatic cancer    Menopause 01/05/2014   Vaginal atrophy 11/09/2014    PAST SURGICAL HISTORY: Past Surgical History:  Procedure Laterality Date   BILATERAL TOTAL MASTECTOMY WITH AXILLARY LYMPH NODE DISSECTION Right 04/25/2021   Procedure: RIGHT TOTAL MASTECTOMY WITH RIGHT AXILLARY LYMPH NODE DISSECTION;  Surgeon: Stark Klein, MD;  Location: [place];  Service: General;  Laterality: Right;   BREAST ENHANCEMENT SURGERY     BREAST IMPLANT REMOVAL Bilateral 04/25/2021   Procedure: REMOVAL BREAST IMPLANTS;  Surgeon: Stark Klein, MD;  Location: [place];  Service: General;  Laterality: Bilateral;   CARPAL TUNNEL RELEASE Right    PORTACATH PLACEMENT N/A 09/17/2020   Procedure: INSERTION PORT-A-CATH;  Surgeon: Stark Klein, MD;  Location: [place];  Service: General;  Laterality: N/A;   refractive lensectomy Bilateral    TOTAL MASTECTOMY Left 04/25/2021   Procedure: LEFT TOTAL MASTECTOMY;  Surgeon: Stark Klein, MD;  Location: [place];  Service: General;  Laterality: Left;    FAMILY HISTORY: Family History  Problem Relation Age of Onset   Breast cancer Mother  59       breast   Hypertension Father    Heart attack Father    Alzheimer's disease Father    Fibromyalgia Sister    Diabetes Sister    Heart attack Sister    Hypertension Sister    Hypertension Brother    Diabetes Brother    Pancreatic cancer Maternal Grandmother 83       Pancreatic   Melanoma Paternal Uncle        dx 45s   Melanoma Paternal Uncle        dx 89s   Ovarian cancer Paternal Aunt        dx 50s/60s  Her father died at age 21 from Alzheimer's. Her mother died at age 2 from metastatic breast cancer. She was initially diagnosed with breast cancer at age 44. Melissa Mcgrath has two brothers and one sister. In addition to her mother, she reports cancer in a paternal aunt (ovarian in mid 79's) and in her maternal grandmother (pancreatic at age 46).    GYNECOLOGIC HISTORY:  No LMP recorded. Patient is postmenopausal. Menarche: 60 years old Age at first live birth: 60 years old [place] P 2 LMP 2010 Contraceptive: used for approx. 20 years HRT used from 11/2019 until diagnosis  Hysterectomy? no BSO? 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 Husband Melissa [place] "Fritz Pickerel" is retired from working for [employer]. For exercise, she walks daily, rides horses every other week, and does farm work  daily. Daughter Melissa Mcgrath, age 45, is a horse trainer/instructor. Daughter Melissa Mcgrath, age 72, is a Copywriter, advertising in [place]. Semone has one grandchild. She attends [employer].    ADVANCED DIRECTIVES: In the absence of any documentation to the contrary, the patient's spouse is their HCPOA.    HEALTH MAINTENANCE: Social History   Tobacco Use   Smoking status: Never   Smokeless tobacco: Never  Vaping Use   Vaping Use: Never used  Substance Use Topics   Alcohol use: No   Drug use: No     Colonoscopy: never done  PAP: 01/2020  Bone density: never done   No Known Allergies  Current Outpatient Medications  Medication Sig Dispense Refill   acetaminophen  (TYLENOL) 500 MG tablet Take 2 tablets (1,000 mg total) by mouth every 6 (six) hours. 30 tablet 0   docusate sodium (COLACE) 100 MG capsule TAKE 2 CAPSULES BY MOUTH TWICE DAILY AS NEEDED FOR CONSTIPATION (Patient not taking: Reported on 04/17/2021) 60 capsule 2   gabapentin (NEURONTIN) 100 MG capsule Take 2 capsules (200 mg total) by mouth 2 (two) times daily. 30 capsule 0   lidocaine-prilocaine (EMLA) cream Apply 1 application topically as needed. (Patient not taking: Reported on 04/17/2021) 30 g 1   loratadine (CLARITIN) 10 MG tablet Take 1 tablet (10 mg total) by mouth daily. (Patient not taking: Reported on 04/17/2021) 90 tablet 0   methocarbamol (ROBAXIN) 500 MG tablet Take 1 tablet (500 mg total) by mouth every 6 (six) hours as needed for muscle spasms. 20 tablet 1   oxyCODONE (OXY IR/ROXICODONE) 5 MG immediate release tablet Take 0.5-1 tablets (2.5-5 mg total) by mouth every 4 (four) hours as needed for moderate pain. 10 tablet 0   triamcinolone lotion (KENALOG) 0.1 % Apply 1 application topically 3 (three) times daily. (Patient not taking: Reported on 04/17/2021) 120 mL 3   valACYclovir (VALTREX) 1000 MG tablet Take 1 tablet (1,000 mg total) by mouth daily. (Patient not taking: Reported on 04/17/2021) 30 tablet 0   No current facility-administered medications for this visit.    OBJECTIVE: White woman wearing a binder Vitals:   05/01/21 1552  BP: (!) 156/94  Pulse: 87  Resp: 18  Temp: 97.6 F (36.4 C)  SpO2: 100%       Body mass index is 21.5 kg/m.   Wt Readings from Last 3 Encounters:  05/01/21 141 lb 6.4 oz (64.1 kg)  04/25/21 142 lb 9.6 oz (64.7 kg)  04/23/21 142 lb 9.6 oz (64.7 kg)     ECOG FS:1 - Symptomatic but completely ambulatory  Sclerae unicteric, EOMs intact Wearing a mask No cervical or supraclavicular adenopathy Lungs no rales or rhonchi Heart regular rate and rhythm Abd soft, nontender, positive bowel sounds MSK no focal spinal tenderness, no upper  extremity lymphedema Neuro: nonfocal, well oriented, appropriate affect Breasts: Status post bilateral mastectomies.  Steri-Strips are still in place.  The incisions are mostly flat, not excessively complex.  There is no erythema or dehiscence noted.   LAB RESULTS:  CMP     Component Value Date/Time   NA 137 04/26/2021 0058   K 4.4 04/26/2021 0058   CL 107 04/26/2021 0058   CO2 19 (L) 04/26/2021 0058   GLUCOSE 219 (H) 04/26/2021 0058   BUN 15 04/26/2021 0058   CREATININE 0.86 04/26/2021 0058   CREATININE 0.68 01/10/2021 0837   CREATININE 0.76 12/20/2013 1207   CALCIUM 8.7 (L) 04/26/2021 0058   PROT 6.5 03/07/2021 1141  ALBUMIN 3.7 03/07/2021 1141   AST 15 03/07/2021 1141   AST 23 01/10/2021 0837   ALT 12 03/07/2021 1141   ALT 21 01/10/2021 0837   ALKPHOS 70 03/07/2021 1141   BILITOT 0.5 03/07/2021 1141   BILITOT 0.5 01/10/2021 0837   GFRNONAA >60 04/26/2021 0058   GFRNONAA >60 01/10/2021 0837    No results found for: TOTALPROTELP, ALBUMINELP, A1GS, A2GS, BETS, BETA2SER, GAMS, MSPIKE, SPEI  Lab Results  Component Value Date   WBC 5.3 05/01/2021   NEUTROABS 3.1 05/01/2021   HGB 11.3 (L) 05/01/2021   HCT 34.2 (L) 05/01/2021   MCV 95.3 05/01/2021   PLT 224 05/01/2021    No results found for: LABCA2  No components found for: RKYHCW237  No results for input(s): INR in the last 168 hours.  No results found for: LABCA2  No results found for: SEG315  No results found for: VVO160  No results found for: VPX106  No results found for: CA2729  No components found for: HGQUANT  No results found for: CEA1 / No results found for: CEA1   No results found for: AFPTUMOR  No results found for: CHROMOGRNA  No results found for: KPAFRELGTCHN, LAMBDASER, KAPLAMBRATIO (kappa/lambda light chains)  No results found for: HGBA, HGBA2QUANT, HGBFQUANT, HGBSQUAN (Hemoglobinopathy evaluation)   No results found for: LDH  No results found for: IRON, TIBC, IRONPCTSAT (Iron  and TIBC)  No results found for: FERRITIN  Urinalysis No results found for: COLORURINE, APPEARANCEUR, LABSPEC, PHURINE, GLUCOSEU, HGBUR, BILIRUBINUR, KETONESUR, PROTEINUR, UROBILINOGEN, NITRITE, LEUKOCYTESUR   STUDIES: No results found.   ELIGIBLE FOR AVAILABLE RESEARCH PROTOCOL: no  ASSESSMENT: 60 y.o. [place] woman status post right breast upper outer quadrant biopsy 08/21/2020 for a clinical mT2 N1, stage IIA/B invasive ductal carcinoma, grade 3, estrogen receptor positive, progesterone receptor weakly positive, HER-2 not amplified, with an MIB-1 of 40%  (a) breast MRI 09/05/2020 shows a clinical T2 N2, stage IIIA disease  (b) bone scan and chest CT scan 09/14/2020 show no evidence of metastatic disease  (1) genetics testing 09/18/2020 through the  [facility]-Expanded + RNAinsight panel found no deleterious mutations in AIP, ALK, APC, ATM, AXIN2, BAP1, BARD1, BLM, BMPR1A, BRCA1, BRCA2, BRIP1, CDC73, CDH1, CDK4, CDKN1B, CDKN2A, CHEK2, CTNNA1, DICER1, FANCC, FH, FLCN, GALNT12, KIF1B, LZTR1, MAX, MEN1, MET, MLH1, MSH2, MSH3, MSH6, MUTYH, NBN, NF1, NF2, NTHL1, PALB2, PHOX2B, PMS2, POT1, PRKAR1A, PTCH1, PTEN, RAD51C, RAD51D, RB1, RECQL, RET, SDHA, SDHAF2, SDHB, SDHC, SDHD, SMAD4, SMARCA4, SMARCB1, SMARCE1, STK11, SUFU, TMEM127, TP53, TSC1, TSC2, VHL and XRCC2 (sequencing and deletion/duplication); EGFR, EGLN1, HOXB13, KIT, MITF, PDGFRA, POLD1 and POLE (sequencing only); EPCAM and GREM1 (deletion/duplication only). RNA data is routinely analyzed for use in variant interpretation for all genes.  (2) neoadjuvant chemotherapy consisting of cyclophosphamide and doxorubicin in dose dense fashion x4 started 09/18/2020, completed 11/15/2020, followed by weekly paclitaxel x12 started 12/07/2020  (a) echo 09/11/2020 shows an ejection fraction in the 60-65% range  (b) CA cycles 3 and 4 were each delayed by 1 week given multiple side effects  (c) start of weekly paclitaxel also delayed 1 week for  the same reason  (d) paclitaxel discontinued after 9 doses due to neuropathy  (3) status post bilateral mastectomies with right axillary lymph nodes dissection on 04/25/2021 showing  (A) on the left, no evidence of malignancy  (B) on the right a residual ypT1c ypN0 invasive ductal carcinoma, grade 3, with a focally positive deep margin   (i) a total of 24 right axillary and 2  right intramammary lymph nodes were removed, all clear  (4) adjuvant radiation to follow  (5) adjuvant antiestrogens to start at the end of local treatment  (A) consider adding abemaciclib   PLAN: Tremeka did very well with her surgery.  She is hoping to get her drains out within the next 10 days .  She is slowly regaining her former functional status, including her weight, and is very pleased with the way her hair has grown back.  Neck step will be adjuvant radiation.  Likely that will start early December.  After that she will start antiestrogens and since I will not be seeing her around that time I went ahead and gave her the information I usually handout to my patients regarding anastrozole versus tamoxifen so she can be studying it.  She understands my preference would be that she go on anastrozole at least initially.  A second question is whether she would benefit from adding abemaciclib to the anastrozole.  She will discuss that with her oncologist here when she returns to see him in January.  At present her neuropathy is very minimal and actually what she is feeling in her right hand may be due to her earlier carpal tunnel surgery.  If there is any worsening of symptoms she might qualify for our current PEA study  Total encounter time 25 minutes.Sarajane Jews C. Shane Badeaux, MD 05/01/21 3:57 PM Medical Oncology and Hematology [facility] [place], [place] [address] Tel. [phone]    Fax. [phone]   I, [name], am acting as scribe for Dr. Virgie Dad. Areon Cocuzza.  I,  Lurline Del MD, have reviewed the above documentation for accuracy and completeness, and I agree with the above.    *Total Encounter Time as defined by the Centers for Medicare and Medicaid Services includes, in addition to the face-to-face time of a patient visit (documented in the note above) non-face-to-face time: obtaining and reviewing outside history, ordering and reviewing medications, tests or procedures, care coordination (communications with other health care professionals or caregivers) and documentation in the medical record.

## 2021-05-09 LAB — SURGICAL PATHOLOGY

## 2021-05-10 NOTE — Progress Notes (Signed)
Location of Breast Cancer: UOQ right breast  Histology per Pathology Report:    Receptor Status:  Estrogen Receptor: 80%, positive, strong staining intensity  Progesterone Receptor: 2%, positive, weak staining intensity  Her2: Negative (FISH)  Ki-67: 40%   Did patient present with symptoms (if so, please note symptoms) or was this found on screening mammography?: Melissa Mcgrath herself palpated an upper-outer right breast lump and noted associated intermittent pain and itching  Past/Anticipated interventions by surgeon, if any:  Bilateral Mastectomies with right axillary lymph node dissection, removal of bilateral retropectoral breast implants Surgeon: Melissa Klein, MD  Past/Anticipated interventions by medical oncology, if any: Dr Melissa Mcgrath (2) neoadjuvant chemotherapy consisting of cyclophosphamide and doxorubicin in dose dense fashion x4 started 09/18/2020, completed 11/15/2020, followed by weekly paclitaxel x12 started 12/07/2020             (a) echo 09/11/2020 shows an ejection fraction in the 60-65% range             (b) CA cycles 3 and 4 were each delayed by 1 week given multiple side effects             (c) start of weekly paclitaxel also delayed 1 week for the same reason   (3) definitive surgery to follow: Patient is considering bilateral mastectomies without reconstruction   (4) adjuvant radiation   (5) adjuvant antiestrogens  Lymphedema issues, if any:  no    Pain issues, if any:  no   SAFETY ISSUES: Prior radiation? no Pacemaker/ICD? no Possible current pregnancy?no, postmenopausal Is the patient on methotrexate? no  Current Complaints / other details:  none    Vitals:   05/20/21 1247  BP: (!) 108/48  Pulse: 96  Resp: 18  Temp: (!) 96.5 F (35.8 C)  TempSrc: Temporal  SpO2: 100%  Weight: 136 lb 8 oz (61.9 kg)  Height: 5' 8"  (1.727 m)

## 2021-05-11 ENCOUNTER — Emergency Department (HOSPITAL_BASED_OUTPATIENT_CLINIC_OR_DEPARTMENT_OTHER)
Admission: EM | Admit: 2021-05-11 | Discharge: 2021-05-11 | Discharge status: Against Medical Advice | Payer: No Typology Code available for payment source

## 2021-05-11 NOTE — ED Notes (Signed)
She is ambulatory and in no distress. She tells me that she has changed her mind, and chooses to "go home". She declines to sign AMA form. I assure her that she is quite welcome to return here at any time.

## 2021-05-12 ENCOUNTER — Inpatient Hospital Stay
Admission: RE | Admit: 2021-05-12 | Discharge: 2021-05-12 | Disposition: A | Discharge status: Home / Self Care | Payer: Self-pay | Source: Ambulatory Visit | Attending: Radiation Oncology | Admitting: Radiation Oncology

## 2021-05-12 ENCOUNTER — Ambulatory Visit
Admission: RE | Admit: 2021-05-12 | Discharge: 2021-05-12 | Disposition: A | Discharge status: Home / Self Care | Payer: Self-pay | Source: Ambulatory Visit | Attending: Radiation Oncology | Admitting: Radiation Oncology

## 2021-05-12 ENCOUNTER — Other Ambulatory Visit: Payer: Self-pay | Admitting: Radiation Oncology

## 2021-05-12 DIAGNOSIS — Z170 Estrogen receptor positive status [ER+]: Secondary | ICD-10-CM

## 2021-05-12 DIAGNOSIS — C50411 Malignant neoplasm of upper-outer quadrant of right female breast: Secondary | ICD-10-CM

## 2021-05-13 ENCOUNTER — Other Ambulatory Visit: Payer: Self-pay

## 2021-05-13 ENCOUNTER — Ambulatory Visit: Payer: No Typology Code available for payment source | Attending: General Surgery | Admitting: Physical Therapy

## 2021-05-13 ENCOUNTER — Encounter: Payer: Self-pay | Admitting: Physical Therapy

## 2021-05-13 DIAGNOSIS — Z170 Estrogen receptor positive status [ER+]: Secondary | ICD-10-CM | POA: Diagnosis present

## 2021-05-13 DIAGNOSIS — R293 Abnormal posture: Secondary | ICD-10-CM | POA: Diagnosis present

## 2021-05-13 DIAGNOSIS — Z483 Aftercare following surgery for neoplasm: Secondary | ICD-10-CM | POA: Diagnosis present

## 2021-05-13 DIAGNOSIS — M25611 Stiffness of right shoulder, not elsewhere classified: Secondary | ICD-10-CM | POA: Diagnosis present

## 2021-05-13 DIAGNOSIS — M25612 Stiffness of left shoulder, not elsewhere classified: Secondary | ICD-10-CM | POA: Insufficient documentation

## 2021-05-13 DIAGNOSIS — C50411 Malignant neoplasm of upper-outer quadrant of right female breast: Secondary | ICD-10-CM | POA: Diagnosis present

## 2021-05-13 NOTE — Patient Instructions (Addendum)
     [facility]  [address]  [place] [address]  [phone]  After Breast Cancer Class It is recommended you attend the ABC class to be educated on lymphedema risk reduction. This class is free of charge and lasts for 1 hour. It is a 1-time class. You will need to download the Webex app either on your phone or computer. We will send you a link the night before or the morning of the class. You should be able to click on that link to join the class. This is not a confidential class. You don't have to turn your camera on, but other participants may be able to see your email address. You are scheduled for December 5th at 11:00.  Scar massage You are not ready to begin this yet :)  Home exercise Program Continue doing the exercises you were given until you feel like you can do them without feeling any tightness at the end. Add the 2 cane exercises below.  Walking Program Studies show that 30 minutes of walking per day (fast enough to elevate your heart rate) can significantly reduce the risk of a cancer recurrence. If you can't walk due to other medical reasons, we encourage you to find another activity you could do (like a stationary bike or water exercise).  Posture After breast cancer surgery, people frequently sit with rounded shoulders posture because it puts their incisions on slack and feels better. If you sit like this and scar tissue forms in that position, you can become very tight and have pain sitting or standing with good posture. Try to be aware of your posture and sit and stand up tall to heal properly.  Follow up PT: It is recommended you return every 3 months for the first 3 years following surgery to be assessed on the SOZO machine for an L-Dex score. This helps prevent clinically significant lymphedema in 95% of patients. These follow up screens are 10 minute appointments that you are not billed for. You are scheduled for February 13th at  4:00.  Cane Exercise: Abduction    Lay on your back. Hold cane with right hand over end, palm-up, with other hand palm-down. Move arm out from side and up by pushing with other arm. Hold __2-3__ seconds. Repeat __6-8__ times. Do __2__ sessions per day.  http://gt2.exer.us/82   Copyright  VHI. All rights reserved.  Flexion (Eccentric) - Active (Cane)    Lay on your back. Lift cane with both hands. Avoid hiking shoulders. Lower cane slowly for 3-5 seconds. __6-8_ reps per set, __2_ sets per day, __7_ days per week.  http://ecce.exer.us/155   Copyright  VHI. All rights reserved.

## 2021-05-13 NOTE — Therapy (Signed)
[place] [facility] @ [address] [place], [place], [address] Phone: [phone]   Fax:  [phone]  Physical Therapy Treatment  Patient Details  Name: Melissa Mcgrath MRN: [id number] Date of Birth: July 12, 1960 Referring Provider (PT): Dr. Stark Klein   Encounter Date: 05/13/2021   PT End of Session - 05/13/21 1606     Visit Number 2    Number of Visits 10    Date for PT Re-Evaluation 06/10/21    PT Start Time 1500    PT Stop Time 1602    PT Time Calculation (min) 62 min    Activity Tolerance Patient tolerated treatment well    Behavior During Therapy [facility] for tasks assessed/performed             Past Medical History:  Diagnosis Date   Anemia    Depression 01/05/2014   Facial basal cell cancer    Family history of breast cancer    Family history of melanoma    Family history of ovarian cancer    Family history of pancreatic cancer    Menopause 01/05/2014   Vaginal atrophy 11/09/2014    Past Surgical History:  Procedure Laterality Date   BILATERAL TOTAL MASTECTOMY WITH AXILLARY LYMPH NODE DISSECTION Right 04/25/2021   Procedure: RIGHT TOTAL MASTECTOMY WITH RIGHT AXILLARY LYMPH NODE DISSECTION;  Surgeon: Stark Klein, MD;  Location: [facility];  Service: General;  Laterality: Right;   BREAST ENHANCEMENT SURGERY     BREAST IMPLANT REMOVAL Bilateral 04/25/2021   Procedure: REMOVAL BREAST IMPLANTS;  Surgeon: Stark Klein, MD;  Location: [facility];  Service: General;  Laterality: Bilateral;   CARPAL TUNNEL RELEASE Right    PORTACATH PLACEMENT N/A 09/17/2020   Procedure: INSERTION PORT-A-CATH;  Surgeon: Stark Klein, MD;  Location: [place];  Service: General;  Laterality: N/A;   refractive lensectomy Bilateral    TOTAL MASTECTOMY Left 04/25/2021   Procedure: LEFT TOTAL MASTECTOMY;  Surgeon: Stark Klein, MD;  Location: [facility];  Service: General;  Laterality: Left;    There were no vitals filed for this  visit.   Subjective Assessment - 05/13/21 1459     Subjective Patient reports she underwent a bilateral mastectomy with a right axillary node dissection (24 negative axillary nodes and 2 negative intramammary nodes) on 04/25/2021. She underwent neoadjuvant chemotherapy from 09/18/2020 - end of September 2022. Drains were removed today. 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 She will undergo radiation on the right side beginning next week followed by anti-estrogen therapy.    Pertinent History Patient was diagnosed on 08/21/2020 with right grade III invasive ductal carcinoma breast cancer. It measures 2.1 cm and is located in the upper outer quadrant. It is ER/PR positive and HER2 negative with a Ki67 of 40%. She underwent a bilateral mastectomy with a right axillary node dissection (24 negative axillary nodes and 2 negative intramammary nodes) on 04/25/2021.    Patient Stated Goals Get arm moving back to normal    Currently in Pain? Yes    Pain Score 4     Pain Location Chest    Pain Orientation Right;Left    Pain Descriptors / Indicators Tightness;Heaviness    Pain Type Surgical pain    Pain Onset 1 to 4 weeks ago    Pain Frequency Intermittent    Aggravating Factors  Driving and moving arm    Pain Relieving Factors Tylenol  [facility] PT Assessment - 05/13/21 0001       Assessment   Medical Diagnosis Right breast cancer; s/p bil mastectomies and right ALND    Referring Provider (PT) Dr. Stark Klein    Onset Date/Surgical Date 04/25/21    Hand Dominance Right    Prior Therapy Baselines      Precautions   Precautions Other (comment)    Precaution Comments right arm lymphedema risk      Restrictions   Weight Bearing Restrictions No      Balance Screen   Has the patient fallen in the past 6 months No    Has the patient had a decrease in activity level because of a fear of falling?  No    Is the patient reluctant to leave their home because of a fear of falling?   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 Drains removed earlier today so that area is covered by a bandage.      Posture/Postural Control   Posture/Postural Control Postural limitations    Postural Limitations Rounded Shoulders;Forward head      ROM / Strength   AROM / PROM / Strength AROM      AROM   AROM Assessment Site Shoulder    Right/Left Shoulder Right;Left    Right Shoulder Extension 46 Degrees    Right Shoulder Flexion 92 Degrees    Right Shoulder ABduction 79 Degrees    Right Shoulder Internal Rotation 76 Degrees    Right Shoulder External Rotation 78 Degrees    Left Shoulder Extension 60 Degrees    Left Shoulder Flexion 133 Degrees    Left Shoulder ABduction 167 Degrees    Left Shoulder Internal Rotation 80 Degrees    Left Shoulder External Rotation 72 Degrees               LYMPHEDEMA/ONCOLOGY QUESTIONNAIRE - 05/13/21 0001       Type   Cancer Type Right breast      Surgeries   Mastectomy Date 04/25/21    Axillary Lymph Node Dissection Date 04/25/21    Number Lymph Nodes Removed 24      Treatment   Active Chemotherapy Treatment No    Past Chemotherapy Treatment Yes    Date 02/21/21    Active Radiation Treatment No    Past Radiation Treatment No    Current Hormone Treatment No    Past Hormone Therapy No      What other symptoms do you have   Are you Having Heaviness or Tightness Yes    Are you having Pain  Yes    Are you having pitting edema No    Is it Hard or Difficult finding clothes that fit No    Do you have infections No    Is there Decreased scar mobility Yes    Stemmer Sign No  Lymphedema Assessments   Lymphedema Assessments Upper extremities      Right Upper Extremity Lymphedema   10 cm Proximal to Olecranon Process 27.5 cm    Olecranon Process 24.6 cm    10 cm Proximal to Ulnar Styloid Process 21.7 cm    Just Proximal to Ulnar Styloid Process 15.5 cm    Across Hand at [employer] 19.8 cm    At [place] of 2nd Digit 6 cm      Left Upper Extremity Lymphedema   10 cm Proximal to Olecranon Process 26.8 cm    Olecranon Process 23.9 cm    10 cm Proximal to Ulnar Styloid Process 20.9 cm    Just Proximal to Ulnar Styloid Process 15.1 cm    Across Hand at [employer] 19.5 cm    At [place] of 2nd Digit 5.9 cm             L-DEX FLOWSHEETS - 05/13/21 1500       L-DEX LYMPHEDEMA SCREENING   Measurement Type --   Baseline was 3.9              Quick Dash - 05/13/21 0001     Open a tight or new jar Unable    Do heavy household chores (wash walls, wash floors) Unable    Carry a shopping bag or briefcase Moderate difficulty    Wash your back Unable    Use a knife to cut food Mild difficulty    Recreational activities in which you take some force or impact through your arm, shoulder, or hand (golf, hammering, tennis) Unable    During the past week, to what extent has your arm, shoulder or hand problem interfered with your normal social activities with family, friends, neighbors, or groups? Extremely    During the past week, to what extent has your arm, shoulder or hand problem limited your work or other regular daily activities Extremely    Arm, shoulder, or hand pain. Severe    Tingling (pins and needles) in your arm, shoulder, or hand Moderate    Difficulty Sleeping Moderate difficulty    DASH Score 77.27 %                             PT  Education - 05/13/21 1550     Education Details Supine cane flexion and abduction; aftercare; HEP    Person(s) Educated Patient    Methods Explanation;Demonstration;Handout    Comprehension Returned demonstration;Verbalized understanding                 PT Long Term Goals - 05/13/21 1612       PT LONG TERM GOAL #1   Title Patient will demonstrate she has regained full shoulder ROM and function post operatively compared to baselines.    Time 4    Period Weeks    Status On-going    Target Date 06/10/21      PT LONG TERM GOAL #2   Title Patient will increase right shoulder active flexion to >/= 140 degrees for increased ease reaching overhead.    Baseline 99    Time 4    Period Weeks    Status New    Target Date 06/10/21      PT LONG TERM GOAL #3   Title Patient will increase right shoulder abduction to >/= 140 degrees to tolerate radiation positioning.    Baseline 92    Time  4    Period Weeks    Status New    Target Date 06/10/21      PT LONG TERM GOAL #4   Title Patient will improve her DASH score to be </= 10 for improved overall UE function.    Baseline 77.27    Time 4    Period Weeks    Status New    Target Date 06/10/21      PT LONG TERM GOAL #5   Title Patient will be able to verbalize good understanding of lymphedema risk reduction practices.    Time 4    Period Weeks    Status New    Target Date 06/10/21                   Plan - 05/13/21 1606     Clinical Impression Statement Patient is doing well s/p bilateral mastectomy and right ALND (24 negative nodes removed from right axilla) on 04/25/2021. She underwent neoadjuvant chemotherapy from 09/18/2020 - 03/07/2021. 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 Drains were removed earlier today from the right side and previously on the left. She has regained nearly full left shoulder ROM but is significnatly limited with her right shoulder ROM and function.  She will benefit from PT to regain full function in both UEs and address scar tissue when steri-strips come off. She will also need to address posture as it is painful for her to sit/stand with erect posture due to chest incisions.    PT Frequency 2x / week    PT Duration 4 weeks    PT Treatment/Interventions ADLs/Self Care Home Management;Manual lymph drainage;Manual techniques;Patient/family education;Passive range of motion;Therapeutic exercise;Therapeutic activities;Scar mobilization    PT Next Visit Plan PROM right shoulder; AAROM exercises bilateral shoulders to pt tolerance    PT Home Exercise Plan post op HEP and supine cane flexion and abduction    Consulted and Agree with Plan of Care Patient             Patient will benefit from skilled therapeutic intervention in order to improve the following deficits and impairments:  Postural dysfunction, Decreased range of motion, Decreased knowledge of precautions, Decreased scar mobility, Impaired UE functional use, Pain, Decreased strength, Increased fascial restricitons  Visit Diagnosis: Malignant neoplasm of upper-outer quadrant of right breast in female, estrogen receptor positive ([place]) - Plan: PT plan of care cert/re-cert  Abnormal posture - Plan: PT plan of care cert/re-cert  Aftercare following surgery for neoplasm - Plan: PT plan of care cert/re-cert  Stiffness of left shoulder, not elsewhere classified - Plan: PT plan of care cert/re-cert  Stiffness of right shoulder, not elsewhere classified - Plan: PT plan of care cert/re-cert     Problem List Patient Active Problem List   Diagnosis Date Noted   Breast cancer metastasized to axillary lymph node, right ([place]) 04/25/2021   Port-A-Cath in place 10/04/2020   Genetic testing 09/18/2020   History of augmentation mammoplasty 08/31/2020   Family history of breast cancer    Family history of pancreatic cancer    Family history of melanoma    Family history of ovarian cancer     Malignant neoplasm of upper-outer quadrant of right breast in female, estrogen receptor positive ([place]) 08/28/2020   Vaginal atrophy 11/09/2014   Menopause 01/05/2014   Annia Friendly, PT 05/13/21 4:18 PM   [facility] @ [place], [place], [address] Phone:  [phone]   Fax:  [phone]  Name: Melissa Mcgrath MRN: [id number] Date of Birth: [birth year]

## 2021-05-19 NOTE — Progress Notes (Signed)
Radiation Oncology         ([phone]) [phone] ________________________________  Name: Melissa Mcgrath MRN: [id number]  Date: 05/20/2021  DOB: 11/11/1960  Re-Evaluation Note  CC: Pablo Lawrence, NP  Magrinat, Virgie Dad, MD    ICD-10-CM   1. Malignant neoplasm of upper-outer quadrant of right breast in female, estrogen receptor positive ([place])  C50.411    Z17.0     2. Breast cancer metastasized to axillary lymph node, right ([facility])  C50.911    C77.3       Diagnosis:  Clinical Stage IIIA (cT2, cN2, cM0) Right Breast UOQ, Invasive Ductal Carcinoma and High-grade DCIS, ER+ / PR+ / Her2-, Grade 3  Narrative:  The patient returns today to discuss radiation treatment options. She was seen in the multidisciplinary breast clinic on 08/29/20.   Bilateral breast MRI on 09/05/20 revealed the 2.6 x 2.0 x 1.1 cm biopsy-proven invasive mammary carcinoma in the 9 o'clock position of the right breast, with probable invasion of the adjacent pectoralis major muscle. MRI also showed 2 adjacent possible metastatic intramammary lymph nodes in lower outer quadrant of the right breast, as well as 4 right axillary lymph nodes suspicious for metastatic nodes.   Echocardiogram on 09/11/2020 showed an ejection fraction of 60-65%.  Chest CT on 09/14/20 showed no signs of nodal enlargement with pathologic features, and a wedge shaped area of hypoattenuation in the peripheral spleen.  Bone scan also performed on 09/14/20 demonstrated no definite evidence of osseous metastases..   Genetic testing performed on 09/18/2020 through the [facility] panel showed negative findings.  She has been treated with neoadjuvant chemotherapy consisting of cyclophosphamide and doxorubicin in dose dense fashion x4 started 09/18/2020, completed 11/15/2020, followed by weekly paclitaxel x12 started 12/07/2020 under Dr. Jana Hakim.  Right breast US on 12/19/20 revealed an interval decrease in size of: the  malignant mass in the 9 o'clock right breast, an adjacent intramammary lymph node, biopsy proven malignant lymph nodes in the axillary tail, and the 3 mm right breast mass.   Bilateral breast MRI on 03/15/21 demonstrated an overall positive response to chemotherapy, defined by a significant interval decrease in size of the biopsy-proven malignancy over the 9 o'clock position of the right breast. A significant interval decrease in size was also seen of multiple small nodes over the axillary tail portion of the right breast extending to the right axilla. This was noted to include the biopsy-proven metastatic axillary tail lymph node.   She opted to proceed with bilateral mastectomies with right axillary lymph node dissection on 04/25/2021. Pathology from right mastectomy revealed: grade 2 invasive ductal carcinoma measuring 1.6 cm, with PNI present, and high-grade DCIS. Margin status positive, with the deep margin found to be focally positive for invasive carcinoma. Left breast mastectomy was negative for carcinoma, and revealed fibrocystic changes and benign intramammary lymph nodes. Nodal status of 24/24 right axillary lymph node biopsies negative for carcinoma. Prognostic indicators significant for ER 80% positive with strong staining intensity, PR 2% positive with weak staining intensity; Her2 negative; Ki67 at 40%; Grade 3.  On review of systems, the patient reports occasional discomfort along the right and left chest wall area but no pain. She denies swelling in her right arm or hand and any other symptoms.   She recently had one of her chest tubes removed.   Allergies:  has No Known Allergies.  Meds: Current Outpatient Medications  Medication Sig Dispense Refill   acetaminophen (TYLENOL) 500 MG tablet Take 2 tablets (1,000  mg total) by mouth every 6 (six) hours. 30 tablet 0   BIOTIN PO Take 1 tablet by mouth daily.     Cholecalciferol (D3 ADULT PO) Take 1 tablet by mouth daily.      ELDERBERRY PO Take 1 tablet by mouth daily.     lidocaine-prilocaine (EMLA) cream Apply 1 application topically as needed. 30 g 1   Multiple Vitamin (MULTIVITAMIN) tablet Take 1 tablet by mouth daily.     TURMERIC PO Take 1 tablet by mouth daily.     valACYclovir (VALTREX) 1000 MG tablet Take 1 tablet (1,000 mg total) by mouth daily. 30 tablet 0   gabapentin (NEURONTIN) 100 MG capsule Take 2 capsules (200 mg total) by mouth 2 (two) times daily. (Patient not taking: Reported on 05/20/2021) 30 capsule 0   methocarbamol (ROBAXIN) 500 MG tablet Take 1 tablet (500 mg total) by mouth every 6 (six) hours as needed for muscle spasms. (Patient not taking: Reported on 05/20/2021) 20 tablet 1   No current facility-administered medications for this encounter.    Physical Findings: The patient is in no acute distress. Patient is alert and oriented.  height is 5' 8"  (1.727 m) and weight is 136 lb 8 oz (61.9 kg). Her temporal temperature is 96.5 F (35.8 C) (abnormal). Her blood pressure is 108/48 (abnormal) and her pulse is 96. Her respiration is 18 and oxygen saturation is 100%.   Lungs are clear to auscultation bilaterally. Heart has regular rate and rhythm. No palpable cervical, supraclavicular, or axillary adenopathy. Abdomen soft, non-tender, normal bowel sounds. The left chest wall area reveals a mastectomy scar in place with Steri-Strips remaining.  No signs of infection or drainage noted.  The right chest wall area shows a mastectomy scar in place with Steri-Strips remaining.  No signs of infection or drainage noted along the right side.  Small scars noted inferior to the mastectomy scar from previous chest tube placement.  The patient's right arm and shoulder mobility is quite limited at this time.  She would not be able to position her arm in the appropriate position for radiation simulation and treatment.   Lab Findings: Lab Results  Component Value Date   WBC 5.3 05/01/2021   HGB 11.3 (L)  05/01/2021   HCT 34.2 (L) 05/01/2021   MCV 95.3 05/01/2021   PLT 224 05/01/2021    Radiographic Findings: No results found.  Impression:  Stage IIIA (cT2, cN2, cM0) Right Breast UOQ, Invasive Ductal Carcinoma and High-grade DCIS, ER+ / PR+ / Her2-, Grade 3.  The patient underwent neoadjuvant chemotherapy with good response to her treatment.  At the time of her axillary node dissection no residual malignancy was noted in 24 lymph nodes removed.  The primary tumor measured 1.6 cm.  This lesion did focally involve the deep margin.  She would be a good candidate for postmastectomy radiation therapy along the right side.  We will boost the central chest given the focally deep positive margin.  Anticipate 6 weeks of postmastectomy radiation therapy.  I discussed the general course of treatment side effects and potential long-term toxicities of postmastectomy radiation therapy with the patient and her daughter.  She appears to understand and wishes to proceed with planned course of treatment.    Plan: She was tentatively scheduled for CT simulation later today however her arm and shoulder shoulder mobility are limited at this time.  I have pushed out her simulation appointment 2 weeks from now.  Anticipate 5 weeks covering the chest wall  and local regional area.  The final week of treatment will be directed at the central chest wall region.  She will also proceed with physical therapy at this time.  -----------------------------------  Blair Promise, PhD, MD  This document serves as a record of services personally performed by Gery Pray, MD. It was created on his behalf by Roney Mans, a trained medical scribe. The creation of this record is based on the scribe's personal observations and the provider's statements to them. This document has been checked and approved by the attending provider.

## 2021-05-20 ENCOUNTER — Ambulatory Visit: Payer: No Typology Code available for payment source | Admitting: Radiation Oncology

## 2021-05-20 ENCOUNTER — Ambulatory Visit
Admission: RE | Admit: 2021-05-20 | Discharge: 2021-05-20 | Disposition: A | Discharge status: Home / Self Care | Payer: No Typology Code available for payment source | Source: Ambulatory Visit | Attending: Radiation Oncology | Admitting: Radiation Oncology

## 2021-05-20 ENCOUNTER — Other Ambulatory Visit: Payer: Self-pay

## 2021-05-20 ENCOUNTER — Encounter: Payer: Self-pay | Admitting: Radiation Oncology

## 2021-05-20 VITALS — BP 108/48 | HR 96 | Temp 96.5°F | Resp 18 | Ht 68.0 in | Wt 136.5 lb

## 2021-05-20 DIAGNOSIS — C773 Secondary and unspecified malignant neoplasm of axilla and upper limb lymph nodes: Secondary | ICD-10-CM

## 2021-05-20 DIAGNOSIS — Z170 Estrogen receptor positive status [ER+]: Secondary | ICD-10-CM | POA: Insufficient documentation

## 2021-05-20 DIAGNOSIS — Z79899 Other long term (current) drug therapy: Secondary | ICD-10-CM | POA: Insufficient documentation

## 2021-05-20 DIAGNOSIS — C50411 Malignant neoplasm of upper-outer quadrant of right female breast: Secondary | ICD-10-CM | POA: Diagnosis not present

## 2021-05-20 DIAGNOSIS — Z9012 Acquired absence of left breast and nipple: Secondary | ICD-10-CM | POA: Insufficient documentation

## 2021-05-20 NOTE — Progress Notes (Signed)
See MD note for nursing evaluation. °

## 2021-05-21 ENCOUNTER — Encounter: Payer: Self-pay | Admitting: *Deleted

## 2021-05-21 ENCOUNTER — Ambulatory Visit: Payer: No Typology Code available for payment source

## 2021-05-21 DIAGNOSIS — M25611 Stiffness of right shoulder, not elsewhere classified: Secondary | ICD-10-CM

## 2021-05-21 DIAGNOSIS — R293 Abnormal posture: Secondary | ICD-10-CM

## 2021-05-21 DIAGNOSIS — Z170 Estrogen receptor positive status [ER+]: Secondary | ICD-10-CM

## 2021-05-21 DIAGNOSIS — Z483 Aftercare following surgery for neoplasm: Secondary | ICD-10-CM

## 2021-05-21 DIAGNOSIS — M25612 Stiffness of left shoulder, not elsewhere classified: Secondary | ICD-10-CM

## 2021-05-21 DIAGNOSIS — C50411 Malignant neoplasm of upper-outer quadrant of right female breast: Secondary | ICD-10-CM | POA: Diagnosis not present

## 2021-05-21 NOTE — Patient Instructions (Signed)
SHOULDER: Flexion - Supine (Cane)        Cancer Rehab [phone]    Hold cane in both hands. Raise arms up overhead. Do not allow back to arch. Hold _5__ seconds. Do __5-10__ times; __1-2__ times a day.   .  Shoulder Blade Stretch    Clasp fingers behind head with elbows touching in front of face. Pull elbows back while pressing shoulder blades together. Relax and hold as tolerated, can place pillow under elbow here for comfort as needed and to allow for prolonged stretch.  Repeat __5__ times. Do __1-2__ sessions per day.      Copyright  VHI. All rights reserved.

## 2021-05-21 NOTE — Therapy (Signed)
[facility] @ [address] [place], [place], [address] Phone: [phone]   Fax:  [phone]  Physical Therapy Treatment  Patient Details  Name: Melissa Mcgrath MRN: [id number] Date of Birth: 01-04-1961 Referring Provider (PT): Dr. Stark Klein   Encounter Date: 05/21/2021   PT End of Session - 05/21/21 1410     Visit Number 3    Number of Visits 10    Date for PT Re-Evaluation 06/10/21    PT Start Time 1301    PT Stop Time 2426    PT Time Calculation (min) 54 min    Activity Tolerance Patient tolerated treatment well    Behavior During Therapy [facility] for tasks assessed/performed             Past Medical History:  Diagnosis Date   Anemia    Depression 01/05/2014   Facial basal cell cancer    Family history of breast cancer    Family history of melanoma    Family history of ovarian cancer    Family history of pancreatic cancer    Menopause 01/05/2014   Vaginal atrophy 11/09/2014    Past Surgical History:  Procedure Laterality Date   BILATERAL TOTAL MASTECTOMY WITH AXILLARY LYMPH NODE DISSECTION Right 04/25/2021   Procedure: RIGHT TOTAL MASTECTOMY WITH RIGHT AXILLARY LYMPH NODE DISSECTION;  Surgeon: Stark Klein, MD;  Location: [facility];  Service: General;  Laterality: Right;   BREAST ENHANCEMENT SURGERY     BREAST IMPLANT REMOVAL Bilateral 04/25/2021   Procedure: REMOVAL BREAST IMPLANTS;  Surgeon: Stark Klein, MD;  Location: [facility];  Service: General;  Laterality: Bilateral;   CARPAL TUNNEL RELEASE Right    PORTACATH PLACEMENT N/A 09/17/2020   Procedure: INSERTION PORT-A-CATH;  Surgeon: Stark Klein, MD;  Location: [facility];  Service: General;  Laterality: N/A;   refractive lensectomy Bilateral    TOTAL MASTECTOMY Left 04/25/2021   Procedure: LEFT TOTAL MASTECTOMY;  Surgeon: Stark Klein, MD;  Location: [facility];  Service: General;  Laterality: Left;    There were no vitals filed for this  visit.   Subjective Assessment - 05/21/21 1259     Subjective Think my ROM is doing pretty well.  I am very active so I haven't got in 2 times per day. Wants me to go to PT 2 more weeks to prepare for Radiation.    Pertinent History Patient was diagnosed on 08/21/2020 with right grade III invasive ductal carcinoma breast cancer. It measures 2.1 cm and is located in the upper outer quadrant. It is ER/PR positive and HER2 negative with a Ki67 of 40%. She underwent a bilateral mastectomy with a right axillary node dissection (24 negative axillary nodes and 2 negative intramammary nodes) on 04/25/2021.    Patient Stated Goals Get arm moving back to normal    Currently in Pain? Yes    Pain Score 2     Pain Location Scapula    Pain Orientation Right    Pain Descriptors / Indicators Sharp;Tender    Pain Type Acute pain    Pain Onset 1 to 4 weeks ago    Pain Frequency Intermittent                               [facility] Adult PT Treatment/Exercise - 05/21/21 0001       Shoulder Exercises: Supine   Other Supine Exercises supine wand flexion, scaption x 5  Shoulder Exercises: Pulleys   Flexion 2 minutes    Scaption 1 minute    ABduction 2 minutes      Manual Therapy   Edema Management Discussed compression sleeve for prophylactic reasons secondary to 24 LN's removed. Pt in agreement for info to be [facility]    Soft tissue mobilization Right pecs, lats, in supine and UT/scapular area in Left SL    Passive ROM PROM bilateral shoulders flexion, scaption,IR/ER with MFR techniques to right shoulder during PROM                     PT Education - 05/21/21 1409     Education Details supine wand flexion and scaption    Person(s) Educated Patient    Methods Explanation;Handout    Comprehension Returned demonstration                 PT Long Term Goals - 05/13/21 1612       PT LONG TERM GOAL #1   Title Patient will demonstrate she has regained full  shoulder ROM and function post operatively compared to baselines.    Time 4    Period Weeks    Status On-going    Target Date 06/10/21      PT LONG TERM GOAL #2   Title Patient will increase right shoulder active flexion to >/= 140 degrees for increased ease reaching overhead.    Baseline 99    Time 4    Period Weeks    Status New    Target Date 06/10/21      PT LONG TERM GOAL #3   Title Patient will increase right shoulder abduction to >/= 140 degrees to tolerate radiation positioning.    Baseline 92    Time 4    Period Weeks    Status New    Target Date 06/10/21      PT LONG TERM GOAL #4   Title Patient will improve her DASH score to be </= 10 for improved overall UE function.    Baseline 77.27    Time 4    Period Weeks    Status New    Target Date 06/10/21      PT LONG TERM GOAL #5   Title Patient will be able to verbalize good understanding of lymphedema risk reduction practices.    Time 4    Period Weeks    Status New    Target Date 06/10/21                   Plan - 05/21/21 1410     Clinical Impression Statement Perofrmed STM to right pectorals, lats and scapular area with multiple twitch responses noted in right scapular area. PROM bilateral shoulder flex, scaption, abd, IR/ER greatest on Right.  Pt performed pulleys first and did quite well.  She is going to order some for home use.  She was also educated in supine wand flexion and scaption.  She was reminded that she must do ROM exs 2x's per day to see the best progress. Cording noted right axillary region. Discussed compression sleeve for prophylactic reasons and sent info to [facility].    Stability/Clinical Decision Making Stable/Uncomplicated    Rehab Potential Excellent    PT Frequency 2x / week    PT Duration 4 weeks    PT Treatment/Interventions ADLs/Self Care Home Management;Manual lymph drainage;Manual techniques;Patient/family education;Passive range of motion;Therapeutic exercise;Therapeutic  activities;Scar mobilization    PT Next Visit Plan  Measure for sleeve,PROM right shoulder; AAROM exercises bilateral shoulders to pt tolerance, STM pecs/scapular area    PT Home Exercise Plan post op HEP and supine cane flexion and abduction    Consulted and Agree with Plan of Care Patient             Patient will benefit from skilled therapeutic intervention in order to improve the following deficits and impairments:  Postural dysfunction, Decreased range of motion, Decreased knowledge of precautions, Decreased scar mobility, Impaired UE functional use, Pain, Decreased strength, Increased fascial restricitons  Visit Diagnosis: Malignant neoplasm of upper-outer quadrant of right breast in female, estrogen receptor positive ([place])  Abnormal posture  Aftercare following surgery for neoplasm  Stiffness of left shoulder, not elsewhere classified  Stiffness of right shoulder, not elsewhere classified     Problem List Patient Active Problem List   Diagnosis Date Noted   Breast cancer metastasized to axillary lymph node, right ([place]) 04/25/2021   Port-A-Cath in place 10/04/2020   Genetic testing 09/18/2020   History of augmentation mammoplasty 08/31/2020   Family history of breast cancer    Family history of pancreatic cancer    Family history of melanoma    Family history of ovarian cancer    Malignant neoplasm of upper-outer quadrant of right breast in female, estrogen receptor positive ([place]) 08/28/2020   Vaginal atrophy 11/09/2014   Menopause 01/05/2014    Claris Pong, PT 05/21/2021, 2:19 PM  [facility] @ [place], [place], [address] Phone: [phone]   Fax:  [phone]  Name: Melissa Mcgrath MRN: [id number] Date of Birth: December 03, 1960

## 2021-05-23 ENCOUNTER — Ambulatory Visit: Payer: Self-pay | Admitting: Physical Therapy

## 2021-05-27 ENCOUNTER — Ambulatory Visit: Payer: No Typology Code available for payment source | Attending: General Surgery

## 2021-05-27 ENCOUNTER — Other Ambulatory Visit: Payer: Self-pay

## 2021-05-27 DIAGNOSIS — Z170 Estrogen receptor positive status [ER+]: Secondary | ICD-10-CM

## 2021-05-27 DIAGNOSIS — Z483 Aftercare following surgery for neoplasm: Secondary | ICD-10-CM | POA: Diagnosis present

## 2021-05-27 DIAGNOSIS — M25611 Stiffness of right shoulder, not elsewhere classified: Secondary | ICD-10-CM | POA: Diagnosis present

## 2021-05-27 DIAGNOSIS — R293 Abnormal posture: Secondary | ICD-10-CM | POA: Diagnosis present

## 2021-05-27 DIAGNOSIS — M25612 Stiffness of left shoulder, not elsewhere classified: Secondary | ICD-10-CM | POA: Diagnosis present

## 2021-05-27 DIAGNOSIS — C50411 Malignant neoplasm of upper-outer quadrant of right female breast: Secondary | ICD-10-CM | POA: Diagnosis not present

## 2021-05-27 NOTE — Therapy (Signed)
[facility] @ [address] [place], [place], [address] Phone: [phone]   Fax:  [phone]  Physical Therapy Treatment  Patient Details  Name: Melissa Mcgrath MRN: [id number] Date of Birth: Jun 30, 1960 Referring Provider (PT): Dr. Stark Klein   Encounter Date: 05/27/2021   PT End of Session - 05/27/21 1719     Visit Number 4    Number of Visits 10    Date for PT Re-Evaluation 06/10/21    PT Start Time 3903    PT Stop Time 1710    PT Time Calculation (min) 58 min    Activity Tolerance Patient tolerated treatment well    Behavior During Therapy [facility] for tasks assessed/performed             Past Medical History:  Diagnosis Date   Anemia    Depression 01/05/2014   Facial basal cell cancer    Family history of breast cancer    Family history of melanoma    Family history of ovarian cancer    Family history of pancreatic cancer    Menopause 01/05/2014   Vaginal atrophy 11/09/2014    Past Surgical History:  Procedure Laterality Date   BILATERAL TOTAL MASTECTOMY WITH AXILLARY LYMPH NODE DISSECTION Right 04/25/2021   Procedure: RIGHT TOTAL MASTECTOMY WITH RIGHT AXILLARY LYMPH NODE DISSECTION;  Surgeon: Stark Klein, MD;  Location: [facility];  Service: General;  Laterality: Right;   BREAST ENHANCEMENT SURGERY     BREAST IMPLANT REMOVAL Bilateral 04/25/2021   Procedure: REMOVAL BREAST IMPLANTS;  Surgeon: Stark Klein, MD;  Location: [facility];  Service: General;  Laterality: Bilateral;   CARPAL TUNNEL RELEASE Right    PORTACATH PLACEMENT N/A 09/17/2020   Procedure: INSERTION PORT-A-CATH;  Surgeon: Stark Klein, MD;  Location: [facility];  Service: General;  Laterality: N/A;   refractive lensectomy Bilateral    TOTAL MASTECTOMY Left 04/25/2021   Procedure: LEFT TOTAL MASTECTOMY;  Surgeon: Stark Klein, MD;  Location: [facility];  Service: General;  Laterality: Left;    There were no vitals filed for this  visit.   Subjective Assessment - 05/27/21 1618     Subjective The Rt shoulder is doing well, the shoulder blade is the only thing that really still bothers me.    Pertinent History Patient was diagnosed on 08/21/2020 with right grade III invasive ductal carcinoma breast cancer. It measures 2.1 cm and is located in the upper outer quadrant. It is ER/PR positive and HER2 negative with a Ki67 of 40%. She underwent a bilateral mastectomy with a right axillary node dissection (24 negative axillary nodes and 2 negative intramammary nodes) on 04/25/2021.    Patient Stated Goals Get arm moving back to normal    Currently in Pain? No/denies                               [facility] Adult PT Treatment/Exercise - 05/27/21 0001       Shoulder Exercises: Pulleys   Flexion 2 minutes    Flexion Limitations VCs to reduce scapular compensation    ABduction 2 minutes      Shoulder Exercises: Therapy Ball   Flexion Both;10 reps   forward lean into end of stretch     Manual Therapy   Soft tissue mobilization Right pecs, lats, in supine and UT/scapular area in Left SL with cocoa butter    Passive ROM PROM Rt>Lt shoulders flexion, abduction,  D2 with MFR techniques to right axilla during PROM                          PT Long Term Goals - 05/13/21 1612       PT LONG TERM GOAL #1   Title Patient will demonstrate she has regained full shoulder ROM and function post operatively compared to baselines.    Time 4    Period Weeks    Status On-going    Target Date 06/10/21      PT LONG TERM GOAL #2   Title Patient will increase right shoulder active flexion to >/= 140 degrees for increased ease reaching overhead.    Baseline 99    Time 4    Period Weeks    Status New    Target Date 06/10/21      PT LONG TERM GOAL #3   Title Patient will increase right shoulder abduction to >/= 140 degrees to tolerate radiation positioning.    Baseline 92    Time 4    Period Weeks     Status New    Target Date 06/10/21      PT LONG TERM GOAL #4   Title Patient will improve her DASH score to be </= 10 for improved overall UE function.    Baseline 77.27    Time 4    Period Weeks    Status New    Target Date 06/10/21      PT LONG TERM GOAL #5   Title Patient will be able to verbalize good understanding of lymphedema risk reduction practices.    Time 4    Period Weeks    Status New    Target Date 06/10/21                   Plan - 05/27/21 1720     Clinical Impression Statement Added pulleys and ball roll up wall today. Pt able to perform with good technique after VCs to decrease scapular compensation. She reports feeling good stretches with these without pain/discomfort. then continued with focus on manual therapy working to decrease fascial restrictions to Rt>Lt chest walls and axillae, also worked to improve end Rt>Lt shoulder P/ROMs.    Stability/Clinical Decision Making Stable/Uncomplicated    Rehab Potential Excellent    PT Frequency 2x / week    PT Duration 4 weeks    PT Treatment/Interventions ADLs/Self Care Home Management;Manual lymph drainage;Manual techniques;Patient/family education;Passive range of motion;Therapeutic exercise;Therapeutic activities;Scar mobilization    PT Next Visit Plan Measure for sleeve,PROM right shoulder; AAROM exercises bilateral shoulders to pt tolerance, STM pecs/scapular area    PT Home Exercise Plan post op HEP and supine cane flexion and abduction    Consulted and Agree with Plan of Care Patient             Patient will benefit from skilled therapeutic intervention in order to improve the following deficits and impairments:  Postural dysfunction, Decreased range of motion, Decreased knowledge of precautions, Decreased scar mobility, Impaired UE functional use, Pain, Decreased strength, Increased fascial restricitons  Visit Diagnosis: Malignant neoplasm of upper-outer quadrant of right breast in female, estrogen  receptor positive ([place])  Abnormal posture  Aftercare following surgery for neoplasm  Stiffness of left shoulder, not elsewhere classified  Stiffness of right shoulder, not elsewhere classified     Problem List Patient Active Problem List   Diagnosis Date Noted   Breast cancer metastasized to axillary  lymph node, right ([place]) 04/25/2021   Port-A-Cath in place 10/04/2020   Genetic testing 09/18/2020   History of augmentation mammoplasty 08/31/2020   Family history of breast cancer    Family history of pancreatic cancer    Family history of melanoma    Family history of ovarian cancer    Malignant neoplasm of upper-outer quadrant of right breast in female, estrogen receptor positive ([place]) 08/28/2020   Vaginal atrophy 11/09/2014   Menopause 01/05/2014    Otelia Limes, PTA 05/27/2021, 5:23 PM  [facility] @ [place], [place], [address] Phone: [phone]   Fax:  [phone]  Name: Melissa Mcgrath MRN: [id number] Date of Birth: 1960/12/09

## 2021-05-30 ENCOUNTER — Encounter: Payer: Self-pay | Admitting: Rehabilitation

## 2021-05-30 ENCOUNTER — Ambulatory Visit: Payer: No Typology Code available for payment source | Admitting: Rehabilitation

## 2021-05-30 ENCOUNTER — Other Ambulatory Visit: Payer: Self-pay

## 2021-05-30 DIAGNOSIS — C50411 Malignant neoplasm of upper-outer quadrant of right female breast: Secondary | ICD-10-CM | POA: Diagnosis not present

## 2021-05-30 DIAGNOSIS — Z170 Estrogen receptor positive status [ER+]: Secondary | ICD-10-CM

## 2021-05-30 DIAGNOSIS — M25612 Stiffness of left shoulder, not elsewhere classified: Secondary | ICD-10-CM

## 2021-05-30 DIAGNOSIS — Z483 Aftercare following surgery for neoplasm: Secondary | ICD-10-CM

## 2021-05-30 DIAGNOSIS — M25611 Stiffness of right shoulder, not elsewhere classified: Secondary | ICD-10-CM

## 2021-05-30 DIAGNOSIS — R293 Abnormal posture: Secondary | ICD-10-CM

## 2021-05-30 NOTE — Therapy (Signed)
[facility] @ [place], [place], [address] Phone: [phone]   Fax:  [phone]  Physical Therapy Treatment  Patient Details  Name: Melissa Mcgrath MRN: [id number] Date of Birth: [birth year] Referring Provider (PT): Dr. Stark Klein   Encounter Date: 05/30/2021   PT End of Session - 05/30/21 1329     Visit Number 5    Number of Visits 10    Date for PT Re-Evaluation 06/10/21    PT Start Time 8309    PT Stop Time 1325    PT Time Calculation (min) 55 min    Activity Tolerance Patient tolerated treatment well    Behavior During Therapy [facility] for tasks assessed/performed             Past Medical History:  Diagnosis Date   Anemia    Depression 01/05/2014   Facial basal cell cancer    Family history of breast cancer    Family history of melanoma    Family history of ovarian cancer    Family history of pancreatic cancer    Menopause 01/05/2014   Vaginal atrophy 11/09/2014    Past Surgical History:  Procedure Laterality Date   BILATERAL TOTAL MASTECTOMY WITH AXILLARY LYMPH NODE DISSECTION Right 04/25/2021   Procedure: RIGHT TOTAL MASTECTOMY WITH RIGHT AXILLARY LYMPH NODE DISSECTION;  Surgeon: Stark Klein, MD;  Location: [facility];  Service: General;  Laterality: Right;   BREAST ENHANCEMENT SURGERY     BREAST IMPLANT REMOVAL Bilateral 04/25/2021   Procedure: REMOVAL BREAST IMPLANTS;  Surgeon: Stark Klein, MD;  Location: [facility];  Service: General;  Laterality: Bilateral;   CARPAL TUNNEL RELEASE Right    PORTACATH PLACEMENT N/A 09/17/2020   Procedure: INSERTION PORT-A-CATH;  Surgeon: Stark Klein, MD;  Location: [facility];  Service: General;  Laterality: N/A;   refractive lensectomy Bilateral    TOTAL MASTECTOMY Left 04/25/2021   Procedure: LEFT TOTAL MASTECTOMY;  Surgeon: Stark Klein, MD;  Location: [facility];  Service: General;  Laterality: Left;    There were no vitals filed for this  visit.   Subjective Assessment - 05/30/21 1226     Subjective I am doing better and better    Pertinent History Patient was diagnosed on 08/21/2020 with right grade III invasive ductal carcinoma breast cancer. It measures 2.1 cm and is located in the upper outer quadrant. It is ER/PR positive and HER2 negative with a Ki67 of 40%. She underwent a bilateral mastectomy with a right axillary node dissection (24 negative axillary nodes and 2 negative intramammary nodes) on 04/25/2021.    Currently in Pain? No/denies                               [facility] Adult PT Treatment/Exercise - 05/30/21 0001       Manual Therapy   Manual Therapy Edema management    Edema Management measured pt for [employer] soft sleeves and emailed order to Romeo Rabon at [place] tissue mobilization Right pecs, lats, in supine and UT/scapular area in Left SL with cocoa butter    Passive ROM PROM Rt>Lt shoulders flexion, abduction, D2 with MFR techniques to right axilla during PROM                          PT Long Term Goals - 05/13/21 1612       PT  LONG TERM GOAL #1   Title Patient will demonstrate she has regained full shoulder ROM and function post operatively compared to baselines.    Time 4    Period Weeks    Status On-going    Target Date 06/10/21      PT LONG TERM GOAL #2   Title Patient will increase right shoulder active flexion to >/= 140 degrees for increased ease reaching overhead.    Baseline 99    Time 4    Period Weeks    Status New    Target Date 06/10/21      PT LONG TERM GOAL #3   Title Patient will increase right shoulder abduction to >/= 140 degrees to tolerate radiation positioning.    Baseline 92    Time 4    Period Weeks    Status New    Target Date 06/10/21      PT LONG TERM GOAL #4   Title Patient will improve her DASH score to be </= 10 for improved overall UE function.    Baseline 77.27    Time 4    Period Weeks    Status New    Target Date  06/10/21      PT LONG TERM GOAL #5   Title Patient will be able to verbalize good understanding of lymphedema risk reduction practices.    Time 4    Period Weeks    Status New    Target Date 06/10/21                   Plan - 05/30/21 1330     Clinical Impression Statement Got pt set up for sleeves and gauntlets today.  Continued MT with focus on Rt upper quadrant release/PROM.  Pt feels like she is able to undress and dress easier now    PT Frequency 2x / week    PT Duration 4 weeks    PT Treatment/Interventions ADLs/Self Care Home Management;Manual lymph drainage;Manual techniques;Patient/family education;Passive range of motion;Therapeutic exercise;Therapeutic activities;Scar mobilization    PT Next Visit Plan PROM ght shoulder; AAROM exercises bilateral shoulders to pt tolerance, STM pecs/scapular area    Consulted and Agree with Plan of Care Patient             Patient will benefit from skilled therapeutic intervention in order to improve the following deficits and impairments:     Visit Diagnosis: Malignant neoplasm of upper-outer quadrant of right breast in female, estrogen receptor positive ([place])  Abnormal posture  Aftercare following surgery for neoplasm  Stiffness of left shoulder, not elsewhere classified  Stiffness of right shoulder, not elsewhere classified     Problem List Patient Active Problem List   Diagnosis Date Noted   Breast cancer metastasized to axillary lymph node, right ([place]) 04/25/2021   Port-A-Cath in place 10/04/2020   Genetic testing 09/18/2020   History of augmentation mammoplasty 08/31/2020   Family history of breast cancer    Family history of pancreatic cancer    Family history of melanoma    Family history of ovarian cancer    Malignant neoplasm of upper-outer quadrant of right breast in female, estrogen receptor positive ([place]) 08/28/2020   Vaginal atrophy 11/09/2014   Menopause 01/05/2014    Stark Bray,  PT 05/30/2021, 1:31 PM  [facility] @ [place], [place], [address] Phone: [phone]   Fax:  [phone]  Name: Melissa Mcgrath MRN: [id number] Date of Birth:  April 28, 1961

## 2021-06-03 ENCOUNTER — Other Ambulatory Visit: Payer: Self-pay

## 2021-06-03 ENCOUNTER — Ambulatory Visit: Payer: No Typology Code available for payment source

## 2021-06-03 DIAGNOSIS — M25612 Stiffness of left shoulder, not elsewhere classified: Secondary | ICD-10-CM

## 2021-06-03 DIAGNOSIS — C50411 Malignant neoplasm of upper-outer quadrant of right female breast: Secondary | ICD-10-CM

## 2021-06-03 DIAGNOSIS — Z170 Estrogen receptor positive status [ER+]: Secondary | ICD-10-CM

## 2021-06-03 DIAGNOSIS — M25611 Stiffness of right shoulder, not elsewhere classified: Secondary | ICD-10-CM

## 2021-06-03 DIAGNOSIS — R293 Abnormal posture: Secondary | ICD-10-CM

## 2021-06-03 DIAGNOSIS — Z483 Aftercare following surgery for neoplasm: Secondary | ICD-10-CM

## 2021-06-03 NOTE — Therapy (Signed)
[facility] @ [place], [place], [address] Phone: [phone]   Fax:  [phone]  Physical Therapy Treatment  Patient Details  Name: Melissa Mcgrath MRN: [id number] Date of Birth: [birth year] Referring Provider (PT): Dr. Stark Klein   Encounter Date: 06/03/2021   PT End of Session - 06/03/21 1358     Visit Number 6    Number of Visits 10    Date for PT Re-Evaluation 06/10/21    PT Start Time 1300    PT Stop Time 1357    PT Time Calculation (min) 57 min    Activity Tolerance Patient tolerated treatment well    Behavior During Therapy [facility] for tasks assessed/performed             Past Medical History:  Diagnosis Date   Anemia    Depression 01/05/2014   Facial basal cell cancer    Family history of breast cancer    Family history of melanoma    Family history of ovarian cancer    Family history of pancreatic cancer    Menopause 01/05/2014   Vaginal atrophy 11/09/2014    Past Surgical History:  Procedure Laterality Date   BILATERAL TOTAL MASTECTOMY WITH AXILLARY LYMPH NODE DISSECTION Right 04/25/2021   Procedure: RIGHT TOTAL MASTECTOMY WITH RIGHT AXILLARY LYMPH NODE DISSECTION;  Surgeon: Stark Klein, MD;  Location: [place];  Service: General;  Laterality: Right;   BREAST ENHANCEMENT SURGERY     BREAST IMPLANT REMOVAL Bilateral 04/25/2021   Procedure: REMOVAL BREAST IMPLANTS;  Surgeon: Stark Klein, MD;  Location: [facility];  Service: General;  Laterality: Bilateral;   CARPAL TUNNEL RELEASE Right    PORTACATH PLACEMENT N/A 09/17/2020   Procedure: INSERTION PORT-A-CATH;  Surgeon: Stark Klein, MD;  Location: [facility];  Service: General;  Laterality: N/A;   refractive lensectomy Bilateral    TOTAL MASTECTOMY Left 04/25/2021   Procedure: LEFT TOTAL MASTECTOMY;  Surgeon: Stark Klein, MD;  Location: [place];  Service: General;  Laterality: Left;    There were no vitals filed for this  visit.   Subjective Assessment - 06/03/21 1256     Subjective I do alot of my exercises in the shower and it helps me relax. Everything is getting better. I used a back pack blower yesterday and Friday and I did fine. I start radiation tomorrow   Pertinent History Patient was diagnosed on 08/21/2020 with right grade III invasive ductal carcinoma breast cancer. It measures 2.1 cm and is located in the upper outer quadrant. It is ER/PR positive and HER2 negative with a Ki67 of 40%. She underwent a bilateral mastectomy with a right axillary node dissection (24 negative axillary nodes and 2 negative intramammary nodes) on 04/25/2021.    Patient Stated Goals Get arm moving back to normal    Currently in Pain? Yes    Pain Score 4     Pain Location Scapula    Pain Orientation Right;Left    Pain Descriptors / Indicators Tender;Sharp    Pain Type Acute pain    Pain Onset 1 to 4 weeks ago    Pain Frequency Intermittent                               [facility] Adult PT Treatment/Exercise - 06/03/21 0001       Shoulder Exercises: Supine   Horizontal ABduction Strengthening;Both;5 reps    Theraband Level (Shoulder  Horizontal ABduction) Level 1 (Yellow)    External Rotation Strengthening;Both;5 reps    Theraband Level (Shoulder External Rotation) Level 1 (Yellow)    Flexion Strengthening;Both;5 reps    Theraband Level (Shoulder Flexion) Level 1 (Yellow)      Shoulder Exercises: Pulleys   Flexion 2 minutes    Scaption 1 minute    ABduction 2 minutes      Manual Therapy   Edema Management discussed sleeve with pt.  Will get both if the second set is 100.00 or less    Soft tissue mobilization Right pecs, lats, in supine and  Bilateral UT/scapular area in  SL with cocoa butter    Passive ROM PROM Rt>Lt shoulders flexion, abduction, D2 with MFR techniques to right axilla during PROM                     PT Education - 06/03/21 1357     Education Details supine scap  horizontal abduction, flexion and bilateral ER x 5    Person(s) Educated Patient    Methods Explanation;Demonstration;Handout    Comprehension Returned demonstration                 PT Long Term Goals - 05/13/21 1612       PT LONG TERM GOAL #1   Title Patient will demonstrate she has regained full shoulder ROM and function post operatively compared to baselines.    Time 4    Period Weeks    Status On-going    Target Date 06/10/21      PT LONG TERM GOAL #2   Title Patient will increase right shoulder active flexion to >/= 140 degrees for increased ease reaching overhead.    Baseline 99    Time 4    Period Weeks    Status New    Target Date 06/10/21      PT LONG TERM GOAL #3   Title Patient will increase right shoulder abduction to >/= 140 degrees to tolerate radiation positioning.    Baseline 92    Time 4    Period Weeks    Status New    Target Date 06/10/21      PT LONG TERM GOAL #4   Title Patient will improve her DASH score to be </= 10 for improved overall UE function.    Baseline 77.27    Time 4    Period Weeks    Status New    Target Date 06/10/21      PT LONG TERM GOAL #5   Title Patient will be able to verbalize good understanding of lymphedema risk reduction practices.    Time 4    Period Weeks    Status New    Target Date 06/10/21                   Plan - 06/03/21 1358     Clinical Impression Statement Pt continues to have shortened areas in right greater than left scapular region with twitch response and right pectorals greater than left.  Small cords still present on right but without significant limitation to ROM.  Instructed in supine scapular series except diagonals with 5 reps only to check pts arm tolerance. Emailed [facility] that pt would like both sleeves/gauntlets and will self pay for 1 as long as it isn't over $100.00.  If it is will purchast just the gray sleeve and gauntlet through [facility].    Stability/Clinical Decision Making  Stable/Uncomplicated  Rehab Potential Excellent    PT Frequency 2x / week    PT Duration 4 weeks    PT Treatment/Interventions ADLs/Self Care Home Management;Manual lymph drainage;Manual techniques;Patient/family education;Passive range of motion;Therapeutic exercise;Therapeutic activities;Scar mobilization    PT Next Visit Plan PROM ght shoulder; AAROM exercises bilateral shoulders to pt tolerance, STM pecs/scapular area;review scapular TB with low reps    PT Home Exercise Plan post op HEP and supine cane flexion and abduction    Consulted and Agree with Plan of Care Patient             Patient will benefit from skilled therapeutic intervention in order to improve the following deficits and impairments:  Postural dysfunction, Decreased range of motion, Decreased knowledge of precautions, Decreased scar mobility, Impaired UE functional use, Pain, Decreased strength, Increased fascial restricitons  Visit Diagnosis: Malignant neoplasm of upper-outer quadrant of right breast in female, estrogen receptor positive ([place])  Abnormal posture  Aftercare following surgery for neoplasm  Stiffness of left shoulder, not elsewhere classified  Stiffness of right shoulder, not elsewhere classified     Problem List Patient Active Problem List   Diagnosis Date Noted   Breast cancer metastasized to axillary lymph node, right ([place]) 04/25/2021   Port-A-Cath in place 10/04/2020   Genetic testing 09/18/2020   History of augmentation mammoplasty 08/31/2020   Family history of breast cancer    Family history of pancreatic cancer    Family history of melanoma    Family history of ovarian cancer    Malignant neoplasm of upper-outer quadrant of right breast in female, estrogen receptor positive ([place]) 08/28/2020   Vaginal atrophy 11/09/2014   Menopause 01/05/2014    Claris Pong, PT 06/03/2021, 2:02 PM  [facility] @ [place], [place], [address] Phone: [phone]   Fax:  [phone]  Name: Melissa Mcgrath MRN: [id number] Date of Birth: June 06, 1961

## 2021-06-03 NOTE — Patient Instructions (Signed)
Over Head Pull: Narrow and Wide Grip   Cancer Rehab [phone]   On back, knees bent, feet flat, band across thighs, elbows straight but relaxed. Pull hands apart (start). Keeping elbows straight, bring arms up and over head, hands toward floor. Keep pull steady on band. Hold momentarily. Return slowly, keeping pull steady, back to start. Then do same with a wider grip on the band (past shoulder width) Repeat _5-10__ times. Band color __yellow____   Side Pull: Double Arm   On back, knees bent, feet flat. Arms perpendicular to body, shoulder level, elbows straight but relaxed. Pull arms out to sides, elbows straight. Resistance band comes across collarbones, hands toward floor. Hold momentarily. Slowly return to starting position. Repeat _5-10__ times. Band color _yellow____    Shoulder Rotation: Double Arm:  only go about half way (45 degrees), towel roll under arm if needed   On back, knees bent, feet flat, elbows tucked at sides, bent 90, hands palms up. Pull hands apart and down toward floor, keeping elbows near sides. Hold momentarily. Slowly return to starting position. Repeat _5-10__ times. Band color __yellow____

## 2021-06-04 ENCOUNTER — Ambulatory Visit
Admission: RE | Admit: 2021-06-04 | Discharge: 2021-06-04 | Disposition: A | Discharge status: Home / Self Care | Payer: No Typology Code available for payment source | Source: Ambulatory Visit | Attending: Radiation Oncology | Admitting: Radiation Oncology

## 2021-06-04 DIAGNOSIS — C50411 Malignant neoplasm of upper-outer quadrant of right female breast: Secondary | ICD-10-CM | POA: Insufficient documentation

## 2021-06-04 DIAGNOSIS — Z170 Estrogen receptor positive status [ER+]: Secondary | ICD-10-CM | POA: Diagnosis present

## 2021-06-05 ENCOUNTER — Encounter: Payer: Self-pay | Admitting: Rehabilitation

## 2021-06-06 ENCOUNTER — Ambulatory Visit: Payer: No Typology Code available for payment source | Admitting: Rehabilitation

## 2021-06-06 ENCOUNTER — Other Ambulatory Visit: Payer: Self-pay

## 2021-06-06 ENCOUNTER — Encounter: Payer: Self-pay | Admitting: Rehabilitation

## 2021-06-06 DIAGNOSIS — C50411 Malignant neoplasm of upper-outer quadrant of right female breast: Secondary | ICD-10-CM

## 2021-06-06 DIAGNOSIS — M25611 Stiffness of right shoulder, not elsewhere classified: Secondary | ICD-10-CM

## 2021-06-06 DIAGNOSIS — Z483 Aftercare following surgery for neoplasm: Secondary | ICD-10-CM

## 2021-06-06 DIAGNOSIS — Z170 Estrogen receptor positive status [ER+]: Secondary | ICD-10-CM

## 2021-06-06 DIAGNOSIS — R293 Abnormal posture: Secondary | ICD-10-CM

## 2021-06-06 DIAGNOSIS — M25612 Stiffness of left shoulder, not elsewhere classified: Secondary | ICD-10-CM

## 2021-06-06 NOTE — Therapy (Signed)
[facility] @ [place], [place], [address] Phone: [phone]   Fax:  [phone]  Physical Therapy Treatment  Patient Details  Name: Melissa Mcgrath MRN: [id number] Date of Birth: Mar 26, 1961 Referring Provider (PT): Dr. Stark Klein   Encounter Date: 06/06/2021   PT End of Session - 06/06/21 1423     Visit Number 7    Number of Visits 10    Date for PT Re-Evaluation 06/10/21    PT Start Time 1330    PT Stop Time 1771    PT Time Calculation (min) 50 min    Activity Tolerance Patient tolerated treatment well    Behavior During Therapy [facility] for tasks assessed/performed             Past Medical History:  Diagnosis Date   Anemia    Depression 01/05/2014   Facial basal cell cancer    Family history of breast cancer    Family history of melanoma    Family history of ovarian cancer    Family history of pancreatic cancer    Menopause 01/05/2014   Vaginal atrophy 11/09/2014    Past Surgical History:  Procedure Laterality Date   BILATERAL TOTAL MASTECTOMY WITH AXILLARY LYMPH NODE DISSECTION Right 04/25/2021   Procedure: RIGHT TOTAL MASTECTOMY WITH RIGHT AXILLARY LYMPH NODE DISSECTION;  Surgeon: Stark Klein, MD;  Location: [facility];  Service: General;  Laterality: Right;   BREAST ENHANCEMENT SURGERY     BREAST IMPLANT REMOVAL Bilateral 04/25/2021   Procedure: REMOVAL BREAST IMPLANTS;  Surgeon: Stark Klein, MD;  Location: [facility];  Service: General;  Laterality: Bilateral;   CARPAL TUNNEL RELEASE Right    PORTACATH PLACEMENT N/A 09/17/2020   Procedure: INSERTION PORT-A-CATH;  Surgeon: Stark Klein, MD;  Location: [facility];  Service: General;  Laterality: N/A;   refractive lensectomy Bilateral    TOTAL MASTECTOMY Left 04/25/2021   Procedure: LEFT TOTAL MASTECTOMY;  Surgeon: Stark Klein, MD;  Location: [facility];  Service: General;  Laterality: Left;    There were no vitals filed for this  visit.   Subjective Assessment - 06/06/21 1329     Subjective I had my simulation and it was fine    Pertinent History Patient was diagnosed on 08/21/2020 with right grade III invasive ductal carcinoma breast cancer. It measures 2.1 cm and is located in the upper outer quadrant. It is ER/PR positive and HER2 negative with a Ki67 of 40%. She underwent a bilateral mastectomy with a right axillary node dissection (24 negative axillary nodes and 2 negative intramammary nodes) on 04/25/2021.    Currently in Pain? No/denies                               [facility] Adult PT Treatment/Exercise - 06/06/21 0001       Manual Therapy   Edema Management pt will still wait on price of Juzo through [place] [facility] but otherwise will get lymphedivas sleeve    Soft tissue mobilization Right pecs, lats, in supine and  Bilateral UT/scapular area in  SL with cocoa butter    Passive ROM PROM Rt>Lt shoulders flexion, abduction, D2 with MFR techniques to right axilla during PROM                          PT Long Term Goals - 05/13/21 1612       PT LONG  TERM GOAL #1   Title Patient will demonstrate she has regained full shoulder ROM and function post operatively compared to baselines.    Time 4    Period Weeks    Status On-going    Target Date 06/10/21      PT LONG TERM GOAL #2   Title Patient will increase right shoulder active flexion to >/= 140 degrees for increased ease reaching overhead.    Baseline 99    Time 4    Period Weeks    Status New    Target Date 06/10/21      PT LONG TERM GOAL #3   Title Patient will increase right shoulder abduction to >/= 140 degrees to tolerate radiation positioning.    Baseline 92    Time 4    Period Weeks    Status New    Target Date 06/10/21      PT LONG TERM GOAL #4   Title Patient will improve her DASH score to be </= 10 for improved overall UE function.    Baseline 77.27    Time 4    Period Weeks    Status New    Target Date  06/10/21      PT LONG TERM GOAL #5   Title Patient will be able to verbalize good understanding of lymphedema risk reduction practices.    Time 4    Period Weeks    Status New    Target Date 06/10/21                   Plan - 06/06/21 1424     Clinical Impression Statement Pt is interested in return to the gym and normally lifts up to 20#.  Discussed return to gym slow and low especially with 25 LN removed.  Will start some more TE next visit as pt has been doing supine scap x 10 no problem.  Continues with pull in the Rt axilla with overhead end range but doing very well.    PT Frequency 2x / week    PT Duration 4 weeks    PT Treatment/Interventions ADLs/Self Care Home Management;Manual lymph drainage;Manual techniques;Patient/family education;Passive range of motion;Therapeutic exercise;Therapeutic activities;Scar mobilization    PT Next Visit Plan PROM right shoulder/STM: end range stretches - start strength ABC program in clinic to begin return to gym - may need sleeve for reps    Consulted and Agree with Plan of Care Patient             Patient will benefit from skilled therapeutic intervention in order to improve the following deficits and impairments:     Visit Diagnosis: Malignant neoplasm of upper-outer quadrant of right breast in female, estrogen receptor positive ([place])  Abnormal posture  Aftercare following surgery for neoplasm  Stiffness of left shoulder, not elsewhere classified  Stiffness of right shoulder, not elsewhere classified     Problem List Patient Active Problem List   Diagnosis Date Noted   Breast cancer metastasized to axillary lymph node, right ([place]) 04/25/2021   Port-A-Cath in place 10/04/2020   Genetic testing 09/18/2020   History of augmentation mammoplasty 08/31/2020   Family history of breast cancer    Family history of pancreatic cancer    Family history of melanoma    Family history of ovarian cancer    Malignant neoplasm  of upper-outer quadrant of right breast in female, estrogen receptor positive ([place]) 08/28/2020   Vaginal atrophy 11/09/2014   Menopause 01/05/2014    Benny Henrie,  Adrian Prince, PT 06/06/2021, 2:29 PM  [facility] @ [place], [place], [address] Phone: [phone]   Fax:  [phone]  Name: Melissa Mcgrath MRN: [id number] Date of Birth: 10/17/1960

## 2021-06-07 ENCOUNTER — Encounter: Payer: Self-pay | Admitting: Physical Therapy

## 2021-06-09 DIAGNOSIS — C50411 Malignant neoplasm of upper-outer quadrant of right female breast: Secondary | ICD-10-CM | POA: Diagnosis not present

## 2021-06-10 ENCOUNTER — Other Ambulatory Visit: Payer: No Typology Code available for payment source

## 2021-06-10 ENCOUNTER — Ambulatory Visit: Payer: No Typology Code available for payment source

## 2021-06-10 ENCOUNTER — Ambulatory Visit: Payer: No Typology Code available for payment source | Admitting: Oncology

## 2021-06-10 ENCOUNTER — Encounter: Payer: Self-pay | Admitting: *Deleted

## 2021-06-10 ENCOUNTER — Other Ambulatory Visit: Payer: Self-pay

## 2021-06-10 DIAGNOSIS — Z483 Aftercare following surgery for neoplasm: Secondary | ICD-10-CM

## 2021-06-10 DIAGNOSIS — Z170 Estrogen receptor positive status [ER+]: Secondary | ICD-10-CM

## 2021-06-10 DIAGNOSIS — C50411 Malignant neoplasm of upper-outer quadrant of right female breast: Secondary | ICD-10-CM | POA: Diagnosis not present

## 2021-06-10 DIAGNOSIS — R293 Abnormal posture: Secondary | ICD-10-CM

## 2021-06-10 DIAGNOSIS — M25612 Stiffness of left shoulder, not elsewhere classified: Secondary | ICD-10-CM

## 2021-06-10 DIAGNOSIS — M25611 Stiffness of right shoulder, not elsewhere classified: Secondary | ICD-10-CM

## 2021-06-10 NOTE — Therapy (Signed)
[facility] @ [place], [place], [address] Phone: [phone]   Fax:  [phone]  Physical Therapy Treatment  Patient Details  Name: Melissa Mcgrath MRN: [id number] Date of Birth: 02/20/1961 Referring Provider (PT): Dr. Stark Klein   Encounter Date: 06/10/2021   PT End of Session - 06/10/21 1321     Visit Number 8    Number of Visits 16    Date for PT Re-Evaluation 07/08/21    PT Start Time 2725    PT Stop Time 3664    PT Time Calculation (min) 53 min    Activity Tolerance Patient tolerated treatment well    Behavior During Therapy [facility] for tasks assessed/performed             Past Medical History:  Diagnosis Date   Anemia    Depression 01/05/2014   Facial basal cell cancer    Family history of breast cancer    Family history of melanoma    Family history of ovarian cancer    Family history of pancreatic cancer    Menopause 01/05/2014   Vaginal atrophy 11/09/2014    Past Surgical History:  Procedure Laterality Date   BILATERAL TOTAL MASTECTOMY WITH AXILLARY LYMPH NODE DISSECTION Right 04/25/2021   Procedure: RIGHT TOTAL MASTECTOMY WITH RIGHT AXILLARY LYMPH NODE DISSECTION;  Surgeon: Stark Klein, MD;  Location: [place];  Service: General;  Laterality: Right;   BREAST ENHANCEMENT SURGERY     BREAST IMPLANT REMOVAL Bilateral 04/25/2021   Procedure: REMOVAL BREAST IMPLANTS;  Surgeon: Stark Klein, MD;  Location: [place];  Service: General;  Laterality: Bilateral;   CARPAL TUNNEL RELEASE Right    PORTACATH PLACEMENT N/A 09/17/2020   Procedure: INSERTION PORT-A-CATH;  Surgeon: Stark Klein, MD;  Location: [facility];  Service: General;  Laterality: N/A;   refractive lensectomy Bilateral    TOTAL MASTECTOMY Left 04/25/2021   Procedure: LEFT TOTAL MASTECTOMY;  Surgeon: Stark Klein, MD;  Location: [facility];  Service: General;  Laterality: Left;    There were no vitals filed for this  visit.   Subjective Assessment - 06/10/21 1303     Subjective I ordered the lymphedema sleeve which should be here this week. I hurt my right shoulder last night playing games trying to get the T shirt off my teammate and slide it onto another.  I think I pulled something.  ROM is improved but still not like the left.  Able to dress easier, reach for things better.  I still have trouble laying on my sides because I am still tender.    Pertinent History Patient was diagnosed on 08/21/2020 with right grade III invasive ductal carcinoma breast cancer. It measures 2.1 cm and is located in the upper outer quadrant. It is ER/PR positive and HER2 negative with a Ki67 of 40%. She underwent a bilateral mastectomy with a right axillary node dissection (24 negative axillary nodes and 2 negative intramammary nodes) on 04/25/2021.    Patient Stated Goals Get arm moving back to normal    Currently in Pain? Yes    Pain Score 1     Pain Location Shoulder    Pain Orientation Right    Pain Descriptors / Indicators Tender    Pain Type Acute pain    Pain Onset Yesterday    Pain Frequency Intermittent    Multiple Pain Sites No                [facility]  PT Assessment - 06/10/21 0001       Assessment   Medical Diagnosis Right breast cancer; s/p bil mastectomies and right ALND    Referring Provider (PT) Dr. Stark Klein    Onset Date/Surgical Date 04/25/21    Hand Dominance Right      Prior Function   Level of Independence Independent      Cognition   Overall Cognitive Status Within Functional Limits for tasks assessed      AROM   Right Shoulder Extension 54 Degrees    Right Shoulder Flexion 152 Degrees    Right Shoulder ABduction 150 Degrees    Right Shoulder External Rotation 92 Degrees    Left Shoulder Extension 60 Degrees    Left Shoulder Flexion 160 Degrees    Left Shoulder ABduction 180 Degrees    Left Shoulder External Rotation 95 Degrees                   Quick Dash - 06/10/21  0001     Open a tight or new jar Moderate difficulty    Do heavy household chores (wash walls, wash floors) Mild difficulty    Carry a shopping bag or briefcase Mild difficulty    Wash your back Mild difficulty    Use a knife to cut food No difficulty    Recreational activities in which you take some force or impact through your arm, shoulder, or hand (golf, hammering, tennis) Mild difficulty    During the past week, to what extent has your arm, shoulder or hand problem interfered with your normal social activities with family, friends, neighbors, or groups? Slightly    During the past week, to what extent has your arm, shoulder or hand problem limited your work or other regular daily activities Modererately    Arm, shoulder, or hand pain. Moderate    Tingling (pins and needles) in your arm, shoulder, or hand None    Difficulty Sleeping Mild difficulty    DASH Score 27.27 %                    [facility] Adult PT Treatment/Exercise - 06/10/21 0001       Shoulder Exercises: Supine   Other Supine Exercises supine wand flexion, scaption x 5      Manual Therapy   Edema Management pt ordered 1 sleeve from lymphadivas    Soft tissue mobilization Right pecs, lats, in supine and  Right UT/scapular area in  SL with cocoa butter    Passive ROM PROM right shoulder flex, abd, ER                          PT Long Term Goals - 06/10/21 1324       PT LONG TERM GOAL #1   Title Patient will demonstrate she has regained full shoulder ROM and function post operatively compared to baselines.    Period Weeks    Status On-going    Target Date 07/08/21      PT LONG TERM GOAL #2   Title Patient will increase right shoulder active flexion to >/= 140 degrees for increased ease reaching overhead.    Time 4    Period Weeks    Status Achieved    Target Date 06/10/21      PT LONG TERM GOAL #3   Title Patient will increase right shoulder abduction to >/= 140 degrees to tolerate  radiation positioning.    Time  4    Period Weeks    Status Achieved    Target Date 06/10/21      PT LONG TERM GOAL #4   Title Patient will improve her DASH score to be </= 10 for improved overall UE function.    Baseline 77.27 baseline, 27.7 today    Time 4    Period Weeks    Status On-going      PT LONG TERM GOAL #5   Title Patient will be able to verbalize good understanding of lymphedema risk reduction practices.    Time 4    Period Weeks    Status Achieved    Target Date 06/10/21                   Plan - 06/10/21 1402     Clinical Impression Statement Pt was reassessed today for Recertification.  She is making excellent progress with AROM, but continues with some limitation on the right, and a new onset of right shoulder pain after playing a party game last night.  She improved her quick dash from 77% to 27% disability.  She continues with some trunk swelling and we discussed a sports bra where we can place some foam pads to try and alleviate swelling especially at right lateral trunk.  We will also initiate MLD and ABC strength class.  She will benefit from continued skilled therapy to address deficits and return to PLOF    Stability/Clinical Decision Making Stable/Uncomplicated    Rehab Potential Excellent    PT Frequency 2x / week    PT Duration 4 weeks    PT Treatment/Interventions ADLs/Self Care Home Management;Manual lymph drainage;Manual techniques;Patient/family education;Passive range of motion;Therapeutic exercise;Therapeutic activities;Scar mobilization    PT Next Visit Plan PROM right shoulder/STM: end range stretches -, did she wear sports bra? IF so consider foam for swelling,MLD for swelling, start strength ABC program in clinic to begin return to gym - may need sleeve for reps. Sleeve should arrive this week    PT Home Exercise Plan post op HEP and supine cane flexion and abduction, supine scap. series    Consulted and Agree with Plan of Care Patient              Patient will benefit from skilled therapeutic intervention in order to improve the following deficits and impairments:  Postural dysfunction, Decreased range of motion, Decreased knowledge of precautions, Decreased scar mobility, Impaired UE functional use, Pain, Decreased strength, Increased fascial restricitons  Visit Diagnosis: Malignant neoplasm of upper-outer quadrant of right breast in female, estrogen receptor positive ([place])  Abnormal posture  Aftercare following surgery for neoplasm  Stiffness of left shoulder, not elsewhere classified  Stiffness of right shoulder, not elsewhere classified     Problem List Patient Active Problem List   Diagnosis Date Noted   Breast cancer metastasized to axillary lymph node, right ([place]) 04/25/2021   Port-A-Cath in place 10/04/2020   Genetic testing 09/18/2020   History of augmentation mammoplasty 08/31/2020   Family history of breast cancer    Family history of pancreatic cancer    Family history of melanoma    Family history of ovarian cancer    Malignant neoplasm of upper-outer quadrant of right breast in female, estrogen receptor positive ([place]) 08/28/2020   Vaginal atrophy 11/09/2014   Menopause 01/05/2014    Claris Pong, PT 06/10/2021, 2:08 PM  [facility] @ [place], [place], [address] Phone: [phone]  Fax:  [phone]  Name: Melissa Mcgrath MRN: [id number] Date of Birth: 26-Dec-1960

## 2021-06-11 ENCOUNTER — Ambulatory Visit
Admission: RE | Admit: 2021-06-11 | Discharge: 2021-06-11 | Disposition: A | Discharge status: Home / Self Care | Payer: No Typology Code available for payment source | Source: Ambulatory Visit | Attending: Radiation Oncology | Admitting: Radiation Oncology

## 2021-06-11 DIAGNOSIS — C50411 Malignant neoplasm of upper-outer quadrant of right female breast: Secondary | ICD-10-CM | POA: Diagnosis not present

## 2021-06-11 NOTE — Addendum Note (Signed)
Addended by: Claris Pong on: 06/11/2021 07:14 AM   Modules accepted: Orders

## 2021-06-12 ENCOUNTER — Other Ambulatory Visit: Payer: Self-pay

## 2021-06-12 ENCOUNTER — Ambulatory Visit
Admission: RE | Admit: 2021-06-12 | Discharge: 2021-06-12 | Disposition: A | Discharge status: Home / Self Care | Payer: No Typology Code available for payment source | Source: Ambulatory Visit | Attending: Radiation Oncology | Admitting: Radiation Oncology

## 2021-06-12 DIAGNOSIS — C50411 Malignant neoplasm of upper-outer quadrant of right female breast: Secondary | ICD-10-CM | POA: Diagnosis not present

## 2021-06-13 ENCOUNTER — Ambulatory Visit: Payer: No Typology Code available for payment source

## 2021-06-13 ENCOUNTER — Ambulatory Visit
Admission: RE | Admit: 2021-06-13 | Discharge: 2021-06-13 | Disposition: A | Discharge status: Home / Self Care | Payer: No Typology Code available for payment source | Source: Ambulatory Visit | Attending: Radiation Oncology | Admitting: Radiation Oncology

## 2021-06-13 DIAGNOSIS — M25611 Stiffness of right shoulder, not elsewhere classified: Secondary | ICD-10-CM

## 2021-06-13 DIAGNOSIS — R293 Abnormal posture: Secondary | ICD-10-CM

## 2021-06-13 DIAGNOSIS — Z483 Aftercare following surgery for neoplasm: Secondary | ICD-10-CM

## 2021-06-13 DIAGNOSIS — C50411 Malignant neoplasm of upper-outer quadrant of right female breast: Secondary | ICD-10-CM | POA: Diagnosis not present

## 2021-06-13 DIAGNOSIS — Z170 Estrogen receptor positive status [ER+]: Secondary | ICD-10-CM

## 2021-06-13 DIAGNOSIS — M25612 Stiffness of left shoulder, not elsewhere classified: Secondary | ICD-10-CM

## 2021-06-13 NOTE — Therapy (Signed)
[facility] °[address] °[place], [place], [address] °Phone: [phone]   Fax:  [phone] ° °Physical Therapy Treatment ° °Patient Details  °Name: Melissa Mcgrath °MRN: [id number] °Date of Birth: 03/24/1961 °Referring Provider (PT): Dr. Faera Byerly ° ° °Encounter Date: 06/13/2021 ° ° PT End of Session - 06/13/21 1515   ° ° Visit Number 9   ° Number of Visits 16   ° Date for PT Re-Evaluation 07/08/21   ° PT Start Time 1303   ° PT Stop Time 1355   ° PT Time Calculation (min) 52 min   ° Activity Tolerance Patient tolerated treatment well   ° Behavior During Therapy WFL for tasks assessed/performed   ° °  °  ° °  ° ° °Past Medical History:  °Diagnosis Date  ° Anemia   ° Depression 01/05/2014  ° Facial basal cell cancer   ° Family history of breast cancer   ° Family history of melanoma   ° Family history of ovarian cancer   ° Family history of pancreatic cancer   ° Menopause 01/05/2014  ° Vaginal atrophy 11/09/2014  ° ° °Past Surgical History:  °Procedure Laterality Date  ° BILATERAL TOTAL MASTECTOMY WITH AXILLARY LYMPH NODE DISSECTION Right 04/25/2021  ° Procedure: RIGHT TOTAL MASTECTOMY WITH RIGHT AXILLARY LYMPH NODE DISSECTION;  Surgeon: Byerly, Faera, MD;  Location: [facility];  Service: General;  Laterality: Right;  ° BREAST ENHANCEMENT SURGERY    ° BREAST IMPLANT REMOVAL Bilateral 04/25/2021  ° Procedure: REMOVAL BREAST IMPLANTS;  Surgeon: Byerly, Faera, MD;  Location: [facility];  Service: General;  Laterality: Bilateral;  ° CARPAL TUNNEL RELEASE Right   ° PORTACATH PLACEMENT N/A 09/17/2020  ° Procedure: INSERTION PORT-A-CATH;  Surgeon: Byerly, Faera, MD;  Location: [facility];  Service: General;  Laterality: N/A;  ° refractive lensectomy Bilateral   ° TOTAL MASTECTOMY Left 04/25/2021  ° Procedure: LEFT TOTAL MASTECTOMY;  Surgeon: Byerly, Faera, MD;  [facility]: [facility];  Service: General;  Laterality: Left;  ° ° °There were no vitals filed for this  visit. ° ° Subjective Assessment - 06/13/21 1257   ° ° Subjective I got the sleeve and gauntlet but they sent the wrong color gauntlet..  The back of my right shoulder is really tender today.   ° Pertinent History Patient was diagnosed on 08/21/2020 with right grade III invasive ductal carcinoma breast cancer. It measures 2.1 cm and is located in the upper outer quadrant. It is ER/PR positive and HER2 negative with a Ki67 of 40%. She underwent a bilateral mastectomy with a right axillary node dissection (24 negative axillary nodes and 2 negative intramammary nodes) on 04/25/2021.   ° Patient Stated Goals Get arm moving back to normal   ° Currently in Pain? Yes   ° Pain Score 2    ° Pain Location Shoulder   ° Pain Orientation Right   ° Pain Descriptors / Indicators Tender   ° Pain Type Acute pain   ° Pain Onset Today   ° Pain Frequency Intermittent   ° Multiple Pain Sites No   ° °  °  ° °  ° ° ° ° ° ° ° ° ° ° ° ° ° ° ° ° ° ° ° ° [facility] Adult PT Treatment/Exercise - 06/13/21 0001   ° °  ° Manual Therapy  ° Edema Management pt received sleeve and gauntlet.  Gauntlet the wrong color and very large at the wrist,   sleeve seems to fit OK. Pt. will contact company about the guantlet   ° Soft tissue mobilization Right pecs, lats, in supine and  Right UT/scapular area in  SL with cocoa butter   ° Manual Lymphatic Drainage (MLD) MLD to right supraclavicular, right axillary and inguinal LN's, right axillo-inguinal pathway, and right chest areas of swelling toward pathway, retracing all steps   ° Passive ROM PROM right shoulder flex, abd, ER   ° °  °  ° °  ° ° ° ° ° ° ° ° ° ° PT Education - 06/13/21 1514   ° ° Education Details Pt educated in standing retraction, shoulder extension and bilateral ER with yellow x 5 reps   ° Person(s) Educated Patient   ° Methods Explanation   ° Comprehension Verbalized understanding   ° °  °  ° °  ° ° ° ° ° ° PT Long Term Goals - 06/10/21 1324   ° °  ° PT LONG TERM GOAL #1  ° Title Patient will  demonstrate she has regained full shoulder ROM and function post operatively compared to baselines.   ° Period Weeks   ° Status On-going   ° Target Date 07/08/21   °  ° PT LONG TERM GOAL #2  ° Title Patient will increase right shoulder active flexion to >/= 140 degrees for increased ease reaching overhead.   ° Time 4   ° Period Weeks   ° Status Achieved   ° Target Date 06/10/21   °  ° PT LONG TERM GOAL #3  ° Title Patient will increase right shoulder abduction to >/= 140 degrees to tolerate radiation positioning.   ° Time 4   ° Period Weeks   ° Status Achieved   ° Target Date 06/10/21   °  ° PT LONG TERM GOAL #4  ° Title Patient will improve her DASH score to be </= 10 for improved overall UE function.   ° Baseline 77.27 baseline, 27.7 today   ° Time 4   ° Period Weeks   ° Status On-going   °  ° PT LONG TERM GOAL #5  ° Title Patient will be able to verbalize good understanding of lymphedema risk reduction practices.   ° Time 4   ° Period Weeks   ° Status Achieved   ° Target Date 06/10/21   ° °  °  ° °  ° ° ° ° ° ° ° ° Plan - 06/13/21 1516   ° ° Clinical Impression Statement Continued soft tissue mobilization, PROM, gentle scar massage of right mastectomy incision. Performed MLD to right axillary and inguinal LN's, axillo-inguinal pathway and right chest toward pathway to assist with swelling in this area. Instructed pt in standing postural theraband exercises with 5 reps only using yellow band.  She required occasional VC's initially with Scapular retraction, but improved with cueing.Showed pt proper way to don sleeve using rubber glove. Gauntlet is much too large at the wrist and is the wrong color. She will call or email company.   ° Stability/Clinical Decision Making Stable/Uncomplicated   ° Rehab Potential Excellent   ° PT Frequency 2x / week   ° PT Duration 4 weeks   ° PT Treatment/Interventions ADLs/Self Care Home Management;Manual lymph drainage;Manual techniques;Patient/family education;Passive range of  motion;Therapeutic exercise;Therapeutic activities;Scar mobilization   ° PT Next Visit Plan second to nature to change bras? initiate ABC strength handout, did she get in touch with company about gauntlet color and size?   °   PT Home Exercise Plan post op HEP and supine cane flexion and abduction, supine scap. series, s with yellow x 5tanding scap retraction, extension, bilateral ER    Consulted and Agree with Plan of Care Patient             Patient will benefit from skilled therapeutic intervention in order to improve the following deficits and impairments:  Postural dysfunction, Decreased range of motion, Decreased knowledge of precautions, Decreased scar mobility, Impaired UE functional use, Pain, Decreased strength, Increased fascial restricitons  Visit Diagnosis: Malignant neoplasm of upper-outer quadrant of right breast in female, estrogen receptor positive ([place])  Abnormal posture  Aftercare following surgery for neoplasm  Stiffness of left shoulder, not elsewhere classified  Stiffness of right shoulder, not elsewhere classified     Problem List Patient Active Problem List   Diagnosis Date Noted   Breast cancer metastasized to axillary lymph node, right ([place]) 04/25/2021   Port-A-Cath in place 10/04/2020   Genetic testing 09/18/2020   History of augmentation mammoplasty 08/31/2020   Family history of breast cancer    Family history of pancreatic cancer    Family history of melanoma    Family history of ovarian cancer    Malignant neoplasm of upper-outer quadrant of right breast in female, estrogen receptor positive ([place]) 08/28/2020   Vaginal atrophy 11/09/2014   Menopause 01/05/2014    Claris Pong, PT 06/13/2021, 3:27 PM  [facility] @ [place], [place], [address] Phone: [phone]   Fax:  [phone]  Name: Melissa Mcgrath MRN: [id number] Date of Birth: [birth year]

## 2021-06-13 NOTE — Patient Instructions (Signed)
Access Code: [email or web address] URL: [email or web address] Date: 06/13/2021 Prepared by: Cheral Almas  Exercises Scapular Retraction with Resistance - 1 x daily - 3 x weekly - 1 sets - 5 reps Scapular Retraction with Resistance Advanced - 1 x daily - 3 x weekly - 1 sets - 5 reps Shoulder External Rotation and Scapular Retraction with Resistance - 1 x daily - 3 x weekly - 1 sets - 5 reps

## 2021-06-14 ENCOUNTER — Ambulatory Visit
Admission: RE | Admit: 2021-06-14 | Discharge: 2021-06-14 | Disposition: A | Discharge status: Home / Self Care | Payer: No Typology Code available for payment source | Source: Ambulatory Visit | Attending: Radiation Oncology | Admitting: Radiation Oncology

## 2021-06-14 ENCOUNTER — Other Ambulatory Visit: Payer: Self-pay

## 2021-06-14 DIAGNOSIS — C50411 Malignant neoplasm of upper-outer quadrant of right female breast: Secondary | ICD-10-CM | POA: Diagnosis not present

## 2021-06-18 ENCOUNTER — Ambulatory Visit
Admission: RE | Admit: 2021-06-18 | Discharge: 2021-06-18 | Disposition: A | Discharge status: Home / Self Care | Payer: No Typology Code available for payment source | Source: Ambulatory Visit | Attending: Radiation Oncology | Admitting: Radiation Oncology

## 2021-06-18 ENCOUNTER — Other Ambulatory Visit: Payer: Self-pay

## 2021-06-18 ENCOUNTER — Encounter: Payer: Self-pay | Admitting: Rehabilitation

## 2021-06-18 ENCOUNTER — Ambulatory Visit: Payer: No Typology Code available for payment source | Admitting: Rehabilitation

## 2021-06-18 DIAGNOSIS — Z170 Estrogen receptor positive status [ER+]: Secondary | ICD-10-CM

## 2021-06-18 DIAGNOSIS — Z483 Aftercare following surgery for neoplasm: Secondary | ICD-10-CM

## 2021-06-18 DIAGNOSIS — C50411 Malignant neoplasm of upper-outer quadrant of right female breast: Secondary | ICD-10-CM | POA: Diagnosis not present

## 2021-06-18 DIAGNOSIS — M25611 Stiffness of right shoulder, not elsewhere classified: Secondary | ICD-10-CM

## 2021-06-18 DIAGNOSIS — M25612 Stiffness of left shoulder, not elsewhere classified: Secondary | ICD-10-CM

## 2021-06-18 DIAGNOSIS — R293 Abnormal posture: Secondary | ICD-10-CM

## 2021-06-18 NOTE — Therapy (Signed)
[facility] @ [facility], [place], [address] Phone: [phone]   Fax:  [phone]  Physical Therapy Treatment  Patient Details  Name: Melissa Mcgrath MRN: [id number] Date of Birth: 1960-08-31 Referring Provider (PT): Dr. Stark Klein   Encounter Date: 06/18/2021   PT End of Session - 06/18/21 1351     Visit Number 10    Number of Visits 16    Date for PT Re-Evaluation 07/08/21    PT Start Time 1117    PT Stop Time 1350    PT Time Calculation (min) 48 min    Activity Tolerance Patient tolerated treatment well    Behavior During Therapy [facility] for tasks assessed/performed             Past Medical History:  Diagnosis Date   Anemia    Depression 01/05/2014   Facial basal cell cancer    Family history of breast cancer    Family history of melanoma    Family history of ovarian cancer    Family history of pancreatic cancer    Menopause 01/05/2014   Vaginal atrophy 11/09/2014    Past Surgical History:  Procedure Laterality Date   BILATERAL TOTAL MASTECTOMY WITH AXILLARY LYMPH NODE DISSECTION Right 04/25/2021   Procedure: RIGHT TOTAL MASTECTOMY WITH RIGHT AXILLARY LYMPH NODE DISSECTION;  Surgeon: Stark Klein, MD;  Location: [facility];  Service: General;  Laterality: Right;   BREAST ENHANCEMENT SURGERY     BREAST IMPLANT REMOVAL Bilateral 04/25/2021   Procedure: REMOVAL BREAST IMPLANTS;  Surgeon: Stark Klein, MD;  Location: [facility];  Service: General;  Laterality: Bilateral;   CARPAL TUNNEL RELEASE Right    PORTACATH PLACEMENT N/A 09/17/2020   Procedure: INSERTION PORT-A-CATH;  Surgeon: Stark Klein, MD;  Location: [facility];  Service: General;  Laterality: N/A;   refractive lensectomy Bilateral    TOTAL MASTECTOMY Left 04/25/2021   Procedure: LEFT TOTAL MASTECTOMY;  Surgeon: Stark Klein, MD;  Location: [facility];  Service: General;  Laterality: Left;    There were no vitals filed for this  visit.   Subjective Assessment - 06/18/21 1304     Subjective my skin is starting to get red    Pertinent History Patient was diagnosed on 08/21/2020 with right grade III invasive ductal carcinoma breast cancer. It measures 2.1 cm and is located in the upper outer quadrant. It is ER/PR positive and HER2 negative with a Ki67 of 40%. She underwent a bilateral mastectomy with a right axillary node dissection (24 negative axillary nodes and 2 negative intramammary nodes) on 04/25/2021.    Currently in Pain? No/denies                               [facility] Adult PT Treatment/Exercise - 06/18/21 0001       Exercises   Exercises Other Exercises    Other Exercises  went over strength ABC with each weight exercise performed x 10 with 2# ; just discussed lower body stretches, strength, and core TE.  Pt is familiar with all of these already and did not need review.      Manual Therapy   Manual Lymphatic Drainage (MLD) to the right anterior chest briefly    Passive ROM to bil shoulders without much limitation  PT Long Term Goals - 06/18/21 1352       PT LONG TERM GOAL #1   Title Patient will demonstrate she has regained full shoulder ROM and function post operatively compared to baselines.    Status Achieved      PT LONG TERM GOAL #2   Title Patient will increase right shoulder active flexion to >/= 140 degrees for increased ease reaching overhead.    Status Achieved      PT LONG TERM GOAL #3   Title Patient will increase right shoulder abduction to >/= 140 degrees to tolerate radiation positioning.    Status Achieved      PT LONG TERM GOAL #4   Title Patient will improve her DASH score to be </= 10 for improved overall UE function.    Status Achieved      PT LONG TERM GOAL #5   Title Patient will be able to verbalize good understanding of lymphedema risk reduction practices.    Status Achieved                   Plan -  06/18/21 1351     Clinical Impression Statement Pt is doing very well.  Feels no pull with PROM bilaterally and feels no ROm restriction with ADLs and farm chores.  Pt will return 2 weeks post radiation to see if any more PT is needed.  Reviewed strength ABC program today.    PT Frequency 2x / week    PT Duration 4 weeks    PT Treatment/Interventions ADLs/Self Care Home Management;Manual lymph drainage;Manual techniques;Patient/family education;Passive range of motion;Therapeutic exercise;Therapeutic activities;Scar mobilization    PT Next Visit Plan post radiation check    Consulted and Agree with Plan of Care Patient             Patient will benefit from skilled therapeutic intervention in order to improve the following deficits and impairments:     Visit Diagnosis: Malignant neoplasm of upper-outer quadrant of right breast in female, estrogen receptor positive ([place])  Abnormal posture  Aftercare following surgery for neoplasm  Stiffness of left shoulder, not elsewhere classified  Stiffness of right shoulder, not elsewhere classified     Problem List Patient Active Problem List   Diagnosis Date Noted   Breast cancer metastasized to axillary lymph node, right ([place]) 04/25/2021   Port-A-Cath in place 10/04/2020   Genetic testing 09/18/2020   History of augmentation mammoplasty 08/31/2020   Family history of breast cancer    Family history of pancreatic cancer    Family history of melanoma    Family history of ovarian cancer    Malignant neoplasm of upper-outer quadrant of right breast in female, estrogen receptor positive ([place]) 08/28/2020   Vaginal atrophy 11/09/2014   Menopause 01/05/2014    Stark Bray, PT 06/18/2021, 1:53 PM  [facility] @ [facility] [place], [place], [address] Phone: [phone]   Fax:  [phone]  Name: Melissa Mcgrath MRN: [id number] Date of Birth: 07-15-1960

## 2021-06-19 ENCOUNTER — Ambulatory Visit
Admission: RE | Admit: 2021-06-19 | Discharge: 2021-06-19 | Disposition: A | Discharge status: Home / Self Care | Payer: No Typology Code available for payment source | Source: Ambulatory Visit | Attending: Radiation Oncology | Admitting: Radiation Oncology

## 2021-06-19 DIAGNOSIS — C50411 Malignant neoplasm of upper-outer quadrant of right female breast: Secondary | ICD-10-CM | POA: Diagnosis not present

## 2021-06-19 MED ORDER — RADIAPLEXRX EX GEL: Freq: Once | CUTANEOUS | Status: DC

## 2021-06-19 MED ORDER — ALRA NON-METALLIC DEODORANT (RAD-ONC): 1.0000 "application " | Freq: Once | TOPICAL | Status: DC

## 2021-06-19 NOTE — Progress Notes (Signed)
Pt here for patient teaching.    Pt given skin care instructions, Alra deodorant, and Radiaplex gel.    Reviewed areas of pertinence such as fatigue, hair loss, skin changes, breast tenderness, and breast swelling .   Pt able to give teach back of to pat skin, use unscented/gentle soap, and use baby wipes,apply Radiaplex bid, avoid applying anything to skin within 4 hours of treatment, avoid wearing an under wire bra, and to use an electric razor if they must shave.   Pt verbalizes understanding of information given and will contact nursing with any questions or concerns.    [email or web address]

## 2021-06-20 ENCOUNTER — Ambulatory Visit
Admission: RE | Admit: 2021-06-20 | Discharge: 2021-06-20 | Disposition: A | Discharge status: Home / Self Care | Payer: No Typology Code available for payment source | Source: Ambulatory Visit | Attending: Radiation Oncology | Admitting: Radiation Oncology

## 2021-06-20 ENCOUNTER — Other Ambulatory Visit: Payer: Self-pay

## 2021-06-20 DIAGNOSIS — C50411 Malignant neoplasm of upper-outer quadrant of right female breast: Secondary | ICD-10-CM | POA: Diagnosis not present

## 2021-06-21 ENCOUNTER — Ambulatory Visit
Admission: RE | Admit: 2021-06-21 | Discharge: 2021-06-21 | Disposition: A | Discharge status: Home / Self Care | Payer: No Typology Code available for payment source | Source: Ambulatory Visit | Attending: Radiation Oncology | Admitting: Radiation Oncology

## 2021-06-21 DIAGNOSIS — C50411 Malignant neoplasm of upper-outer quadrant of right female breast: Secondary | ICD-10-CM | POA: Diagnosis not present

## 2021-06-25 ENCOUNTER — Other Ambulatory Visit: Payer: Self-pay

## 2021-06-25 ENCOUNTER — Ambulatory Visit
Admission: RE | Admit: 2021-06-25 | Discharge: 2021-06-25 | Disposition: A | Discharge status: Home / Self Care | Payer: No Typology Code available for payment source | Source: Ambulatory Visit | Attending: Radiation Oncology | Admitting: Radiation Oncology

## 2021-06-25 DIAGNOSIS — C50411 Malignant neoplasm of upper-outer quadrant of right female breast: Secondary | ICD-10-CM | POA: Insufficient documentation

## 2021-06-25 DIAGNOSIS — Z170 Estrogen receptor positive status [ER+]: Secondary | ICD-10-CM | POA: Insufficient documentation

## 2021-06-26 ENCOUNTER — Ambulatory Visit
Admission: RE | Admit: 2021-06-26 | Discharge: 2021-06-26 | Disposition: A | Discharge status: Home / Self Care | Payer: No Typology Code available for payment source | Source: Ambulatory Visit | Attending: Radiation Oncology | Admitting: Radiation Oncology

## 2021-06-26 DIAGNOSIS — C50411 Malignant neoplasm of upper-outer quadrant of right female breast: Secondary | ICD-10-CM | POA: Diagnosis not present

## 2021-06-27 ENCOUNTER — Other Ambulatory Visit: Payer: Self-pay

## 2021-06-27 ENCOUNTER — Ambulatory Visit
Admission: RE | Admit: 2021-06-27 | Discharge: 2021-06-27 | Disposition: A | Discharge status: Home / Self Care | Payer: No Typology Code available for payment source | Source: Ambulatory Visit | Attending: Radiation Oncology | Admitting: Radiation Oncology

## 2021-06-27 DIAGNOSIS — C50411 Malignant neoplasm of upper-outer quadrant of right female breast: Secondary | ICD-10-CM | POA: Diagnosis not present

## 2021-06-28 ENCOUNTER — Ambulatory Visit
Admission: RE | Admit: 2021-06-28 | Discharge: 2021-06-28 | Disposition: A | Discharge status: Home / Self Care | Payer: No Typology Code available for payment source | Source: Ambulatory Visit | Attending: Radiation Oncology | Admitting: Radiation Oncology

## 2021-06-28 DIAGNOSIS — C50411 Malignant neoplasm of upper-outer quadrant of right female breast: Secondary | ICD-10-CM | POA: Diagnosis not present

## 2021-07-01 ENCOUNTER — Other Ambulatory Visit: Payer: Self-pay

## 2021-07-01 ENCOUNTER — Ambulatory Visit
Admission: RE | Admit: 2021-07-01 | Discharge: 2021-07-01 | Disposition: A | Discharge status: Home / Self Care | Payer: No Typology Code available for payment source | Source: Ambulatory Visit | Attending: Radiation Oncology | Admitting: Radiation Oncology

## 2021-07-01 DIAGNOSIS — C50411 Malignant neoplasm of upper-outer quadrant of right female breast: Secondary | ICD-10-CM | POA: Diagnosis not present

## 2021-07-02 ENCOUNTER — Ambulatory Visit
Admission: RE | Admit: 2021-07-02 | Discharge: 2021-07-02 | Disposition: A | Discharge status: Home / Self Care | Payer: No Typology Code available for payment source | Source: Ambulatory Visit | Attending: Radiation Oncology | Admitting: Radiation Oncology

## 2021-07-02 DIAGNOSIS — C50411 Malignant neoplasm of upper-outer quadrant of right female breast: Secondary | ICD-10-CM | POA: Diagnosis not present

## 2021-07-03 ENCOUNTER — Ambulatory Visit
Admission: RE | Admit: 2021-07-03 | Discharge: 2021-07-03 | Disposition: A | Discharge status: Home / Self Care | Payer: No Typology Code available for payment source | Source: Ambulatory Visit | Attending: Radiation Oncology | Admitting: Radiation Oncology

## 2021-07-03 ENCOUNTER — Other Ambulatory Visit: Payer: Self-pay

## 2021-07-03 DIAGNOSIS — C50411 Malignant neoplasm of upper-outer quadrant of right female breast: Secondary | ICD-10-CM | POA: Diagnosis not present

## 2021-07-03 DIAGNOSIS — Z170 Estrogen receptor positive status [ER+]: Secondary | ICD-10-CM

## 2021-07-03 MED ORDER — SONAFINE EX EMUL
1.0000 "application " | Freq: Once | CUTANEOUS | Status: AC
  Administered 2021-07-03: 1 via TOPICAL

## 2021-07-04 ENCOUNTER — Ambulatory Visit
Admission: RE | Admit: 2021-07-04 | Discharge: 2021-07-04 | Disposition: A | Discharge status: Home / Self Care | Payer: No Typology Code available for payment source | Source: Ambulatory Visit | Attending: Radiation Oncology | Admitting: Radiation Oncology

## 2021-07-04 DIAGNOSIS — C50411 Malignant neoplasm of upper-outer quadrant of right female breast: Secondary | ICD-10-CM | POA: Diagnosis not present

## 2021-07-05 ENCOUNTER — Other Ambulatory Visit: Payer: Self-pay

## 2021-07-05 ENCOUNTER — Ambulatory Visit
Admission: RE | Admit: 2021-07-05 | Discharge: 2021-07-05 | Disposition: A | Discharge status: Home / Self Care | Payer: No Typology Code available for payment source | Source: Ambulatory Visit | Attending: Radiation Oncology | Admitting: Radiation Oncology

## 2021-07-05 DIAGNOSIS — C50411 Malignant neoplasm of upper-outer quadrant of right female breast: Secondary | ICD-10-CM | POA: Diagnosis not present

## 2021-07-08 ENCOUNTER — Ambulatory Visit
Admission: RE | Admit: 2021-07-08 | Discharge: 2021-07-08 | Disposition: A | Discharge status: Home / Self Care | Payer: No Typology Code available for payment source | Source: Ambulatory Visit | Attending: Radiation Oncology | Admitting: Radiation Oncology

## 2021-07-08 ENCOUNTER — Encounter: Payer: Self-pay | Admitting: Radiation Oncology

## 2021-07-08 ENCOUNTER — Ambulatory Visit: Payer: No Typology Code available for payment source | Admitting: Radiation Oncology

## 2021-07-08 ENCOUNTER — Other Ambulatory Visit: Payer: Self-pay

## 2021-07-08 DIAGNOSIS — C50411 Malignant neoplasm of upper-outer quadrant of right female breast: Secondary | ICD-10-CM

## 2021-07-08 DIAGNOSIS — Z170 Estrogen receptor positive status [ER+]: Secondary | ICD-10-CM

## 2021-07-09 ENCOUNTER — Ambulatory Visit: Payer: No Typology Code available for payment source | Admitting: Radiation Oncology

## 2021-07-09 ENCOUNTER — Ambulatory Visit
Admission: RE | Admit: 2021-07-09 | Discharge: 2021-07-09 | Disposition: A | Discharge status: Home / Self Care | Payer: No Typology Code available for payment source | Source: Ambulatory Visit | Attending: Radiation Oncology | Admitting: Radiation Oncology

## 2021-07-09 DIAGNOSIS — C50411 Malignant neoplasm of upper-outer quadrant of right female breast: Secondary | ICD-10-CM | POA: Diagnosis not present

## 2021-07-09 NOTE — Progress Notes (Signed)
Patient Care Team: Pablo Lawrence, NP as PCP - General (Adult Health Nurse Practitioner) Mauro Kaufmann, RN as Oncology Nurse Navigator Rockwell Germany, RN as Oncology Nurse Navigator Magrinat, Virgie Dad, MD as Consulting Physician (Oncology) Stark Klein, MD as Consulting Physician (General Surgery) Gery Pray, MD as Consulting Physician (Radiation Oncology) Dian Queen, MD as Consulting Physician (Obstetrics and Gynecology) Ulla Gallo, MD as Consulting Physician (Dermatology)  DIAGNOSIS:    ICD-10-CM   1. Malignant neoplasm of upper-outer quadrant of right breast in female, estrogen receptor positive ([place])  C50.411    Z17.0       SUMMARY OF ONCOLOGIC HISTORY: Oncology History  Malignant neoplasm of upper-outer quadrant of right breast in female, estrogen receptor positive ([place])  08/21/2020 Initial Diagnosis   right breast upper outer quadrant biopsy 08/21/2020 for a clinical mT2 N1, stage IIA/B invasive ductal carcinoma, grade 3, estrogen receptor positive, progesterone receptor weakly positive, HER-2 not amplified, with an MIB-1 of 40%   08/29/2020 Cancer Staging   Staging form: Breast, AJCC 8th Edition - Clinical stage from 08/29/2020: Stage IIIA (cT2, cN2, cM0, G3, ER+, PR+, HER2-) - Signed by Gardenia Phlegm, NP on 09/07/2020 Stage prefix: Initial diagnosis Stage used in treatment planning: Yes National guidelines used in treatment planning: Yes Type of national guideline used in treatment planning: NCCN    09/18/2020 - 03/01/2021 Chemotherapy   AC X 4 foll by Taxol X 12    09/18/2020 Genetic Testing   Negative genetic testing:  No pathogenic variants detected on the [facility] CancerNext-Expanded + RNAinsight panel. The report date is 09/18/2020.   The CancerNext-Expanded + RNAinsight gene panel offered by [employer] and includes sequencing and rearrangement analysis for the following 77 genes: AIP, ALK, APC, ATM, AXIN2, BAP1, BARD1, BLM, BMPR1A, BRCA1,  BRCA2, BRIP1, CDC73, CDH1, CDK4, CDKN1B, CDKN2A, CHEK2, CTNNA1, DICER1, FANCC, FH, FLCN, GALNT12, KIF1B, LZTR1, MAX, MEN1, MET, MLH1, MSH2, MSH3, MSH6, MUTYH, NBN, NF1, NF2, NTHL1, PALB2, PHOX2B, PMS2, POT1, PRKAR1A, PTCH1, PTEN, RAD51C, RAD51D, RB1, RECQL, RET, SDHA, SDHAF2, SDHB, SDHC, SDHD, SMAD4, SMARCA4, SMARCB1, SMARCE1, STK11, SUFU, TMEM127, TP53, TSC1, TSC2, VHL and XRCC2 (sequencing and deletion/duplication); EGFR, EGLN1, HOXB13, KIT, MITF, PDGFRA, POLD1 and POLE (sequencing only); EPCAM and GREM1 (deletion/duplication only). RNA data is routinely analyzed for use in variant interpretation for all genes.    Surgery   bilateral mastectomies with right axillary lymph nodes dissection on 04/25/2021 showing (A) on the left, no evidence of malignancy (B) on the right a residual ypT1c ypN0 invasive ductal carcinoma, grade 3, with a focally positive deep margin a total of 24 right axillary and 2 right intramammary lymph nodes were removed, all clear   06/12/2021 - 07/24/2021 Radiation Therapy   Adj XRT     CHIEF COMPLIANT: Follow-up of breast cancer  INTERVAL HISTORY: Melissa Mcgrath is a 61 y.o. with above-mentioned history of estrogen receptor positive breast cancer. She presents to the clinic today for follow-up.  She is currently on radiation and appears to be tolerating it fairly well.  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  Her last radiation will be in February.  She is here today to discuss antiestrogen treatment options.  ALLERGIES:  has No Known Allergies.  MEDICATIONS:  Current Outpatient Medications  Medication Sig Dispense Refill   acetaminophen (TYLENOL) 500 MG tablet Take 2 tablets (1,000 mg total) by mouth every 6 (six) hours. 30 tablet 0   BIOTIN PO Take 1 tablet by mouth daily.  Cholecalciferol (D3 ADULT PO) Take 1 tablet by mouth daily.     ELDERBERRY PO Take 1 tablet by mouth daily.     gabapentin (NEURONTIN) 100 MG capsule Take 2 capsules (200 mg total) by  mouth 2 (two) times daily. 30 capsule 0   lidocaine-prilocaine (EMLA) cream Apply 1 application topically as needed. 30 g 1   methocarbamol (ROBAXIN) 500 MG tablet Take 1 tablet (500 mg total) by mouth every 6 (six) hours as needed for muscle spasms. 20 tablet 1   Multiple Vitamin (MULTIVITAMIN) tablet Take 1 tablet by mouth daily.     TURMERIC PO Take 1 tablet by mouth daily.     valACYclovir (VALTREX) 1000 MG tablet Take 1 tablet (1,000 mg total) by mouth daily. 30 tablet 0   No current facility-administered medications for this visit.    PHYSICAL EXAMINATION: ECOG PERFORMANCE STATUS: 1 - Symptomatic but completely ambulatory  Vitals:   07/10/21 1105  BP: (!) 119/51  Pulse: 78  Resp: 18  Temp: 97.7 F (36.5 C)  SpO2: 100%   Filed Weights   07/10/21 1105  Weight: 140 lb 6 oz (63.7 kg)      LABORATORY DATA:  I have reviewed the data as listed CMP Latest Ref Rng & Units 07/10/2021 05/01/2021 04/26/2021  Glucose 70 - 99 mg/dL 94 93 219(H)  BUN 6 - 20 mg/dL 15 14 15   Creatinine 0.44 - 1.00 mg/dL 0.79 0.93 0.86  Sodium 135 - 145 mmol/L 141 141 137  Potassium 3.5 - 5.1 mmol/L 4.2 4.0 4.4  Chloride 98 - 111 mmol/L 106 107 107  CO2 22 - 32 mmol/L 29 24 19(L)  Calcium 8.9 - 10.3 mg/dL 9.8 9.0 8.7(L)  Total Protein 6.5 - 8.1 g/dL 7.2 6.6 -  Total Bilirubin 0.3 - 1.2 mg/dL 0.6 0.2(L) -  Alkaline Phos 38 - 126 U/L 68 69 -  AST 15 - 41 U/L 14(L) 20 -  ALT 0 - 44 U/L 10 28 -    Lab Results  Component Value Date   WBC 4.7 07/10/2021   HGB 12.6 07/10/2021   HCT 37.0 07/10/2021   MCV 94.4 07/10/2021   PLT 199 07/10/2021   NEUTROABS 3.3 07/10/2021    ASSESSMENT & PLAN:  Malignant neoplasm of upper-outer quadrant of right breast in female, estrogen receptor positive ([place]) 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%, PR 2%, HER-2 not amplified, with an MIB-1 of 40% T2N2 Stage 3A Neoadj chemo  with AC-T completed 10/26/20 Bil mastectomies 04/25/21: Rt: 1.6 cm, grade 3, Deep margin pos, Perineural inv present, 0/24 Axln, 0/2 Intra mamm LN Neg  Adj XRT to be done 07/24/21  Treatment Plan: Adj Anti estrogen therapy with letrozole and CDK 4 and 6 inhibitor abemaciclib Letrozole counseling: We discussed the risks and benefits of anti-estrogen therapy with aromatase inhibitors. These include but not limited to insomnia, hot flashes, mood changes, vaginal dryness, bone density loss, and weight gain. We strongly believe that the benefits far outweigh the risks. Patient understands these risks and consented to starting treatment. Planned treatment duration is 7 years.  Abemaciclib counseling: I discussed at length the risks and benefits of Abemaciclib in combination with letrozole. Adverse effects of Abemaciclib include decreasing neutrophil count, pneumonia, blood clots in lungs as well as nausea and GI symptoms. Side effects of letrozole include hot flashes, muscle aches and pains, uterine bleeding/spotting/cancer, osteoporosis, risk of blood clots.  Our plan is to initially treat her with letrozole and see how she tolerates it before introducing abemaciclib.  Return to clinic 2 months for survivorship care plan visit. Return to clinic in 4 months to discuss starting Verzenio   No orders of the defined types were placed in this encounter.  The patient has a good understanding of the overall plan. she agrees with it. she will call with any problems that may develop before the next visit here.  Total time spent: 45 mins including face to face time and time spent for planning, charting and coordination of care  Rulon Eisenmenger, MD, MPH 07/10/2021  I, Thana Ates, am acting as scribe for Dr. Nicholas Lose.  I have reviewed the above documentation for accuracy and completeness, and I agree with the above.

## 2021-07-10 ENCOUNTER — Inpatient Hospital Stay (HOSPITAL_BASED_OUTPATIENT_CLINIC_OR_DEPARTMENT_OTHER): Payer: No Typology Code available for payment source | Admitting: Hematology and Oncology

## 2021-07-10 ENCOUNTER — Other Ambulatory Visit: Payer: Self-pay

## 2021-07-10 ENCOUNTER — Ambulatory Visit
Admission: RE | Admit: 2021-07-10 | Discharge: 2021-07-10 | Disposition: A | Discharge status: Home / Self Care | Payer: No Typology Code available for payment source | Source: Ambulatory Visit | Attending: Radiation Oncology | Admitting: Radiation Oncology

## 2021-07-10 ENCOUNTER — Inpatient Hospital Stay: Payer: No Typology Code available for payment source | Attending: Adult Health

## 2021-07-10 ENCOUNTER — Inpatient Hospital Stay: Payer: No Typology Code available for payment source

## 2021-07-10 DIAGNOSIS — Z95828 Presence of other vascular implants and grafts: Secondary | ICD-10-CM

## 2021-07-10 DIAGNOSIS — Z170 Estrogen receptor positive status [ER+]: Secondary | ICD-10-CM

## 2021-07-10 DIAGNOSIS — C50411 Malignant neoplasm of upper-outer quadrant of right female breast: Secondary | ICD-10-CM

## 2021-07-10 LAB — CBC WITH DIFFERENTIAL/PLATELET
Abs Immature Granulocytes: 0.01 10*3/uL (ref 0.00–0.07)
Basophils Absolute: 0.1 10*3/uL (ref 0.0–0.1)
Basophils Relative: 1 %
Eosinophils Absolute: 0.1 10*3/uL (ref 0.0–0.5)
Eosinophils Relative: 3 %
HCT: 37 % (ref 36.0–46.0)
Hemoglobin: 12.6 g/dL (ref 12.0–15.0)
Immature Granulocytes: 0 %
Lymphocytes Relative: 13 %
Lymphs Abs: 0.6 10*3/uL — ABNORMAL LOW (ref 0.7–4.0)
MCH: 32.1 pg (ref 26.0–34.0)
MCHC: 34.1 g/dL (ref 30.0–36.0)
MCV: 94.4 fL (ref 80.0–100.0)
Monocytes Absolute: 0.5 10*3/uL (ref 0.1–1.0)
Monocytes Relative: 12 %
Neutro Abs: 3.3 10*3/uL (ref 1.7–7.7)
Neutrophils Relative %: 71 %
Platelets: 199 10*3/uL (ref 150–400)
RBC: 3.92 MIL/uL (ref 3.87–5.11)
RDW: 12.9 % (ref 11.5–15.5)
WBC: 4.7 10*3/uL (ref 4.0–10.5)
nRBC: 0 % (ref 0.0–0.2)

## 2021-07-10 LAB — COMPREHENSIVE METABOLIC PANEL
ALT: 10 U/L (ref 0–44)
AST: 14 U/L — ABNORMAL LOW (ref 15–41)
Albumin: 4.2 g/dL (ref 3.5–5.0)
Alkaline Phosphatase: 68 U/L (ref 38–126)
Anion gap: 6 (ref 5–15)
BUN: 15 mg/dL (ref 6–20)
CO2: 29 mmol/L (ref 22–32)
Calcium: 9.8 mg/dL (ref 8.9–10.3)
Chloride: 106 mmol/L (ref 98–111)
Creatinine, Ser: 0.79 mg/dL (ref 0.44–1.00)
GFR, Estimated: >60 mL/min (ref >60)
Glucose, Bld: 94 mg/dL (ref 70–99)
Potassium: 4.2 mmol/L (ref 3.5–5.1)
Sodium: 141 mmol/L (ref 135–145)
Total Bilirubin: 0.6 mg/dL (ref 0.3–1.2)
Total Protein: 7.2 g/dL (ref 6.5–8.1)

## 2021-07-10 MED ORDER — LETROZOLE 2.5 MG PO TABS: 2.5000 mg | ORAL_TABLET | Freq: Every day | ORAL | 3 refills | Status: DC

## 2021-07-10 MED ORDER — HEPARIN SOD (PORK) LOCK FLUSH 100 UNIT/ML IV SOLN
500.0000 [IU] | Freq: Once | INTRAVENOUS | Status: AC
  Administered 2021-07-10: 500 [IU] via INTRAVENOUS

## 2021-07-10 MED ORDER — SODIUM CHLORIDE 0.9% FLUSH
10.0000 mL | INTRAVENOUS | Status: DC | PRN
  Administered 2021-07-10: 10 mL via INTRAVENOUS

## 2021-07-10 NOTE — Assessment & Plan Note (Signed)
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%, PR 2%, HER-2 not amplified, with an MIB-1 of 40% T2N2 Stage 3A Neoadj chemo with AC-T completed 10/26/20 Bil mastectomies 04/25/21: Rt: 1.6 cm, grade 3, Deep margin pos, Perineural inv present, 0/24 Axln, 0/2 Intra mamm LN Neg  Adj XRT to be done 07/24/21  Treatment Plan: Adj Anti estrogen therapy with letrozole and CDK 4 and 6 inhibitor abemaciclib Letrozole counseling: We discussed the risks and benefits of anti-estrogen therapy with aromatase inhibitors. These include but not limited to insomnia, hot flashes, mood changes, vaginal dryness, bone density loss, and weight gain. We strongly believe that the benefits far outweigh the risks. Patient understands these risks and consented to starting treatment. Planned treatment duration is 7 years.  Abemaciclib counseling: I discussed at length the risks and benefits of Abemaciclib in combination with letrozole. Adverse effects of Abemaciclib include decreasing neutrophil count, pneumonia, blood clots in lungs as well as nausea and GI symptoms. Side effects of letrozole include hot flashes, muscle aches and pains, uterine bleeding/spotting/cancer, osteoporosis, risk of blood clots.  Return to clinic 2 weeks after starting Verzenio

## 2021-07-10 NOTE — Progress Notes (Signed)
No blood return from port. Labs drawn peripherally °

## 2021-07-11 ENCOUNTER — Ambulatory Visit
Admission: RE | Admit: 2021-07-11 | Discharge: 2021-07-11 | Disposition: A | Discharge status: Home / Self Care | Payer: No Typology Code available for payment source | Source: Ambulatory Visit | Attending: Radiation Oncology | Admitting: Radiation Oncology

## 2021-07-11 DIAGNOSIS — C50411 Malignant neoplasm of upper-outer quadrant of right female breast: Secondary | ICD-10-CM | POA: Diagnosis not present

## 2021-07-12 ENCOUNTER — Ambulatory Visit
Admission: RE | Admit: 2021-07-12 | Discharge: 2021-07-12 | Disposition: A | Discharge status: Home / Self Care | Payer: No Typology Code available for payment source | Source: Ambulatory Visit | Attending: Radiation Oncology | Admitting: Radiation Oncology

## 2021-07-12 ENCOUNTER — Other Ambulatory Visit: Payer: Self-pay

## 2021-07-12 DIAGNOSIS — C50411 Malignant neoplasm of upper-outer quadrant of right female breast: Secondary | ICD-10-CM | POA: Diagnosis not present

## 2021-07-15 ENCOUNTER — Ambulatory Visit
Admission: RE | Admit: 2021-07-15 | Discharge: 2021-07-15 | Disposition: A | Discharge status: Home / Self Care | Payer: No Typology Code available for payment source | Source: Ambulatory Visit | Attending: Radiation Oncology | Admitting: Radiation Oncology

## 2021-07-15 ENCOUNTER — Ambulatory Visit: Payer: No Typology Code available for payment source | Admitting: Radiation Oncology

## 2021-07-15 ENCOUNTER — Other Ambulatory Visit: Payer: Self-pay

## 2021-07-15 DIAGNOSIS — C50411 Malignant neoplasm of upper-outer quadrant of right female breast: Secondary | ICD-10-CM | POA: Diagnosis not present

## 2021-07-16 ENCOUNTER — Ambulatory Visit
Admission: RE | Admit: 2021-07-16 | Discharge: 2021-07-16 | Disposition: A | Discharge status: Home / Self Care | Payer: No Typology Code available for payment source | Source: Ambulatory Visit | Attending: Radiation Oncology | Admitting: Radiation Oncology

## 2021-07-16 ENCOUNTER — Ambulatory Visit
Admission: RE | Admit: 2021-07-16 | Payer: No Typology Code available for payment source | Source: Ambulatory Visit | Admitting: Radiation Oncology

## 2021-07-16 DIAGNOSIS — C50411 Malignant neoplasm of upper-outer quadrant of right female breast: Secondary | ICD-10-CM | POA: Diagnosis not present

## 2021-07-16 DIAGNOSIS — Z170 Estrogen receptor positive status [ER+]: Secondary | ICD-10-CM

## 2021-07-16 MED ORDER — SILVER SULFADIAZINE 1 % EX CREA
1.0000 "application " | TOPICAL_CREAM | Freq: Once | CUTANEOUS | Status: AC
  Administered 2021-07-16: 1 via TOPICAL

## 2021-07-17 ENCOUNTER — Other Ambulatory Visit: Payer: Self-pay

## 2021-07-17 ENCOUNTER — Ambulatory Visit
Admission: RE | Admit: 2021-07-17 | Discharge: 2021-07-17 | Disposition: A | Discharge status: Home / Self Care | Payer: No Typology Code available for payment source | Source: Ambulatory Visit | Attending: Radiation Oncology | Admitting: Radiation Oncology

## 2021-07-17 DIAGNOSIS — C50411 Malignant neoplasm of upper-outer quadrant of right female breast: Secondary | ICD-10-CM | POA: Diagnosis not present

## 2021-07-18 ENCOUNTER — Ambulatory Visit
Admission: RE | Admit: 2021-07-18 | Discharge: 2021-07-18 | Disposition: A | Discharge status: Home / Self Care | Payer: No Typology Code available for payment source | Source: Ambulatory Visit | Attending: Radiation Oncology | Admitting: Radiation Oncology

## 2021-07-18 DIAGNOSIS — Z170 Estrogen receptor positive status [ER+]: Secondary | ICD-10-CM

## 2021-07-18 DIAGNOSIS — C50411 Malignant neoplasm of upper-outer quadrant of right female breast: Secondary | ICD-10-CM

## 2021-07-19 ENCOUNTER — Other Ambulatory Visit: Payer: Self-pay

## 2021-07-19 ENCOUNTER — Ambulatory Visit
Admission: RE | Admit: 2021-07-19 | Discharge: 2021-07-19 | Disposition: A | Discharge status: Home / Self Care | Payer: No Typology Code available for payment source | Source: Ambulatory Visit | Attending: Radiation Oncology | Admitting: Radiation Oncology

## 2021-07-19 DIAGNOSIS — C50411 Malignant neoplasm of upper-outer quadrant of right female breast: Secondary | ICD-10-CM | POA: Diagnosis not present

## 2021-07-21 ENCOUNTER — Encounter: Payer: Self-pay | Admitting: Radiation Oncology

## 2021-07-22 ENCOUNTER — Telehealth: Payer: No Typology Code available for payment source | Admitting: Nurse Practitioner

## 2021-07-22 ENCOUNTER — Ambulatory Visit: Payer: No Typology Code available for payment source

## 2021-07-22 ENCOUNTER — Telehealth: Payer: Self-pay

## 2021-07-22 ENCOUNTER — Encounter: Payer: Self-pay | Admitting: Radiation Oncology

## 2021-07-22 DIAGNOSIS — J40 Bronchitis, not specified as acute or chronic: Secondary | ICD-10-CM

## 2021-07-22 MED ORDER — DOXYCYCLINE HYCLATE 100 MG PO TABS
100.0000 mg | ORAL_TABLET | Freq: Two times a day (BID) | ORAL | 0 refills | Status: AC
Start: 2021-07-22 — End: 2021-08-01

## 2021-07-22 MED ORDER — BENZONATATE 100 MG PO CAPS: 100.0000 mg | ORAL_CAPSULE | Freq: Three times a day (TID) | ORAL | 0 refills | Status: DC | PRN

## 2021-07-22 NOTE — Telephone Encounter (Signed)
Received my chart message from patient stating that she is not feeling well. Patient reports taking a covid test which was negative.Patient states she thinks she has the flu. Patient wanted to cancel her radiation treatment today. Called and made [facility] aware. Patient to reach out tomorrow if she is not feeling better.

## 2021-07-22 NOTE — Progress Notes (Signed)
We are sorry that you are not feeling well.  Here is how we plan to help!  Based on your presentation I believe you most likely have A cough due to bacteria.  When patients have a fever and a productive cough with a change in color or increased sputum production, we are concerned about bacterial bronchitis.  If left untreated it can progress to pneumonia.  If your symptoms do not improve with your treatment plan it is important that you contact your provider.   I have prescribed Doxycycline 100 mg twice a day for 10 days     In addition you may use A prescription cough medication called Tessalon Perles 100mg . You may take 1-2 capsules every 8 hours as needed for your cough.   From your responses in the eVisit questionnaire you describe inflammation in the upper respiratory tract which is causing a significant cough.  This is commonly called Bronchitis and has four common causes:   Allergies Viral Infections Acid Reflux Bacterial Infection Allergies, viruses and acid reflux are treated by controlling symptoms or eliminating the cause. An example might be a cough caused by taking certain blood pressure medications. You stop the cough by changing the medication. Another example might be a cough caused by acid reflux. Controlling the reflux helps control the cough.  USE OF BRONCHODILATOR ("RESCUE") INHALERS: There is a risk from using your bronchodilator too frequently.  The risk is that over-reliance on a medication which only relaxes the muscles surrounding the breathing tubes can reduce the effectiveness of medications prescribed to reduce swelling and congestion of the tubes themselves.  Although you feel brief relief from the bronchodilator inhaler, your asthma may actually be worsening with the tubes becoming more swollen and filled with mucus.  This can delay other crucial treatments, such as oral steroid medications. If you need to use a bronchodilator inhaler daily, several times per day, you  should discuss this with your provider.  There are probably better treatments that could be used to keep your asthma under control.     HOME CARE Only take medications as instructed by your medical team. Complete the entire course of an antibiotic. Drink plenty of fluids and get plenty of rest. Avoid close contacts especially the very young and the elderly Cover your mouth if you cough or cough into your sleeve. Always remember to wash your hands A steam or ultrasonic humidifier can help congestion.   GET HELP RIGHT AWAY IF: You develop worsening fever. You become short of breath You cough up blood. Your symptoms persist after you have completed your treatment plan MAKE SURE YOU  Understand these instructions. Will watch your condition. Will get help right away if you are not doing well or get worse.    Thank you for choosing an e-visit.  Your e-visit answers were reviewed by a board certified advanced clinical practitioner to complete your personal care plan. Depending upon the condition, your plan could have included both over the counter or prescription medications.  Please review your pharmacy choice. Make sure the pharmacy is open so you can pick up prescription now. If there is a problem, you may contact your provider through [employer] and have the prescription routed to another pharmacy.  Your safety is important to [place]. If you have drug allergies check your prescription carefully.   For the next 24 hours you can use MyChart to ask questions about today's visit, request a non-urgent call back, or ask for a work or school  excuse. You will get an email in the next two days asking about your experience. I hope that your e-visit has been valuable and will speed your recovery.   I spent approximately 7 minutes reviewing the patient's history, current symptoms and coordinating their plan of care today.    Meds ordered this encounter  Medications   doxycycline (VIBRA-TABS)  100 MG tablet    Sig: Take 1 tablet (100 mg total) by mouth 2 (two) times daily for 10 days.    Dispense:  20 tablet    Refill:  0   benzonatate (TESSALON) 100 MG capsule    Sig: Take 1 capsule (100 mg total) by mouth 3 (three) times daily as needed for cough.    Dispense:  30 capsule    Refill:  0

## 2021-07-23 ENCOUNTER — Encounter: Payer: Self-pay | Admitting: *Deleted

## 2021-07-23 ENCOUNTER — Ambulatory Visit: Payer: No Typology Code available for payment source

## 2021-07-23 DIAGNOSIS — C50411 Malignant neoplasm of upper-outer quadrant of right female breast: Secondary | ICD-10-CM

## 2021-07-23 DIAGNOSIS — Z170 Estrogen receptor positive status [ER+]: Secondary | ICD-10-CM

## 2021-07-24 ENCOUNTER — Encounter: Payer: Self-pay | Admitting: Radiation Oncology

## 2021-07-24 ENCOUNTER — Encounter: Payer: Self-pay | Admitting: Oncology

## 2021-07-24 ENCOUNTER — Ambulatory Visit: Payer: No Typology Code available for payment source

## 2021-07-24 ENCOUNTER — Ambulatory Visit
Admission: RE | Admit: 2021-07-24 | Discharge: 2021-07-24 | Disposition: A | Discharge status: Home / Self Care | Payer: No Typology Code available for payment source | Source: Ambulatory Visit | Attending: Radiation Oncology | Admitting: Radiation Oncology

## 2021-07-24 ENCOUNTER — Other Ambulatory Visit: Payer: Self-pay

## 2021-07-24 DIAGNOSIS — C50411 Malignant neoplasm of upper-outer quadrant of right female breast: Secondary | ICD-10-CM | POA: Insufficient documentation

## 2021-07-24 DIAGNOSIS — Z170 Estrogen receptor positive status [ER+]: Secondary | ICD-10-CM | POA: Diagnosis present

## 2021-07-24 MED ORDER — SILVER SULFADIAZINE 1 % EX CREA
1.0000 "application " | TOPICAL_CREAM | Freq: Once | CUTANEOUS | Status: AC
  Administered 2021-07-24: 1 via TOPICAL

## 2021-07-25 ENCOUNTER — Ambulatory Visit
Admission: RE | Admit: 2021-07-25 | Discharge: 2021-07-25 | Disposition: A | Discharge status: Home / Self Care | Payer: No Typology Code available for payment source | Source: Ambulatory Visit | Attending: Radiation Oncology | Admitting: Radiation Oncology

## 2021-07-25 ENCOUNTER — Ambulatory Visit: Payer: No Typology Code available for payment source

## 2021-07-25 DIAGNOSIS — C50411 Malignant neoplasm of upper-outer quadrant of right female breast: Secondary | ICD-10-CM | POA: Diagnosis not present

## 2021-07-26 ENCOUNTER — Other Ambulatory Visit: Payer: Self-pay

## 2021-07-26 ENCOUNTER — Ambulatory Visit
Admission: RE | Admit: 2021-07-26 | Discharge: 2021-07-26 | Disposition: A | Discharge status: Home / Self Care | Payer: No Typology Code available for payment source | Source: Ambulatory Visit | Attending: Radiation Oncology | Admitting: Radiation Oncology

## 2021-07-26 ENCOUNTER — Encounter: Payer: Self-pay | Admitting: Radiation Oncology

## 2021-07-26 DIAGNOSIS — C50411 Malignant neoplasm of upper-outer quadrant of right female breast: Secondary | ICD-10-CM | POA: Diagnosis not present

## 2021-08-05 ENCOUNTER — Ambulatory Visit: Payer: No Typology Code available for payment source | Attending: General Surgery

## 2021-08-05 ENCOUNTER — Other Ambulatory Visit: Payer: Self-pay

## 2021-08-05 VITALS — Wt 140.4 lb

## 2021-08-05 DIAGNOSIS — C50411 Malignant neoplasm of upper-outer quadrant of right female breast: Secondary | ICD-10-CM | POA: Insufficient documentation

## 2021-08-05 DIAGNOSIS — M25611 Stiffness of right shoulder, not elsewhere classified: Secondary | ICD-10-CM | POA: Insufficient documentation

## 2021-08-05 DIAGNOSIS — Z483 Aftercare following surgery for neoplasm: Secondary | ICD-10-CM | POA: Insufficient documentation

## 2021-08-05 DIAGNOSIS — M25612 Stiffness of left shoulder, not elsewhere classified: Secondary | ICD-10-CM | POA: Insufficient documentation

## 2021-08-05 DIAGNOSIS — Z170 Estrogen receptor positive status [ER+]: Secondary | ICD-10-CM | POA: Insufficient documentation

## 2021-08-05 DIAGNOSIS — R293 Abnormal posture: Secondary | ICD-10-CM | POA: Insufficient documentation

## 2021-08-05 NOTE — Therapy (Signed)
[facility] @ [address] [place], [place], [address] Phone: [phone]   Fax:  [phone]  Physical Therapy Treatment  Patient Details  Name: Melissa Mcgrath MRN: [id number] Date of Birth: 11-21-1960 Referring Provider (PT): Dr. Stark Klein   Encounter Date: 08/05/2021   PT End of Session - 08/05/21 1606     Visit Number 10   # unchanged due to screen only   PT Start Time 4034    PT Stop Time 1610    PT Time Calculation (min) 6 min    Activity Tolerance Patient tolerated treatment well    Behavior During Therapy [facility] for tasks assessed/performed             Past Medical History:  Diagnosis Date   Anemia    Depression 01/05/2014   Facial basal cell cancer    Family history of breast cancer    Family history of melanoma    Family history of ovarian cancer    Family history of pancreatic cancer    Menopause 01/05/2014   Vaginal atrophy 11/09/2014    Past Surgical History:  Procedure Laterality Date   BILATERAL TOTAL MASTECTOMY WITH AXILLARY LYMPH NODE DISSECTION Right 04/25/2021   Procedure: RIGHT TOTAL MASTECTOMY WITH RIGHT AXILLARY LYMPH NODE DISSECTION;  Surgeon: Stark Klein, MD;  Location: [facility];  Service: General;  Laterality: Right;   BREAST ENHANCEMENT SURGERY     BREAST IMPLANT REMOVAL Bilateral 04/25/2021   Procedure: REMOVAL BREAST IMPLANTS;  Surgeon: Stark Klein, MD;  Location: [facility];  Service: General;  Laterality: Bilateral;   CARPAL TUNNEL RELEASE Right    PORTACATH PLACEMENT N/A 09/17/2020   Procedure: INSERTION PORT-A-CATH;  Surgeon: Stark Klein, MD;  Location: [facility];  Service: General;  Laterality: N/A;   refractive lensectomy Bilateral    TOTAL MASTECTOMY Left 04/25/2021   Procedure: LEFT TOTAL MASTECTOMY;  Surgeon: Stark Klein, MD;  Location: [facility];  Service: General;  Laterality: Left;    Vitals:   08/05/21 1605  Weight: 140 lb 6 oz (63.7 kg)     Subjective  Assessment - 08/05/21 1605     Subjective Pt returns for her 3 month L-Dex screen.    Pertinent History Patient was diagnosed on 08/21/2020 with right grade III invasive ductal carcinoma breast cancer. It measures 2.1 cm and is located in the upper outer quadrant. It is ER/PR positive and HER2 negative with a Ki67 of 40%. She underwent a bilateral mastectomy with a right axillary node dissection (24 negative axillary nodes and 2 negative intramammary nodes) on 04/25/2021.                    L-DEX FLOWSHEETS - 08/05/21 1600       L-DEX LYMPHEDEMA SCREENING   Measurement Type Unilateral    L-DEX MEASUREMENT EXTREMITY Upper Extremity    POSITION  Standing    DOMINANT SIDE Right    At Risk Side Right    BASELINE SCORE (UNILATERAL) 3.9    L-DEX SCORE (UNILATERAL) 3.9    VALUE CHANGE (UNILAT) 0                                     PT Long Term Goals - 06/18/21 1352       PT LONG TERM GOAL #1   Title Patient will demonstrate she has regained full shoulder ROM and function  post operatively compared to baselines.    Status Achieved      PT LONG TERM GOAL #2   Title Patient will increase right shoulder active flexion to >/= 140 degrees for increased ease reaching overhead.    Status Achieved      PT LONG TERM GOAL #3   Title Patient will increase right shoulder abduction to >/= 140 degrees to tolerate radiation positioning.    Status Achieved      PT LONG TERM GOAL #4   Title Patient will improve her DASH score to be </= 10 for improved overall UE function.    Status Achieved      PT LONG TERM GOAL #5   Title Patient will be able to verbalize good understanding of lymphedema risk reduction practices.    Status Achieved                   Plan - 08/05/21 1613     Clinical Impression Statement Pt returns for her 3 month L-Dex screen. Her change from baseline of 0 is WNLs so no further treatment is required at this time except to cont every  3 month L-Dex screens which pt is agreeable to.    PT Next Visit Plan Cont every 3 month L-Dex screens for up to 2 years from her ALND (~04/26/2023)    Consulted and Agree with Plan of Care Patient             Patient will benefit from skilled therapeutic intervention in order to improve the following deficits and impairments:     Visit Diagnosis: Aftercare following surgery for neoplasm     Problem List Patient Active Problem List   Diagnosis Date Noted   Breast cancer metastasized to axillary lymph node, right ([place]) 04/25/2021   Port-A-Cath in place 10/04/2020   Genetic testing 09/18/2020   History of augmentation mammoplasty 08/31/2020   Family history of breast cancer    Family history of pancreatic cancer    Family history of melanoma    Family history of ovarian cancer    Malignant neoplasm of upper-outer quadrant of right breast in female, estrogen receptor positive ([place]) 08/28/2020   Vaginal atrophy 11/09/2014   Menopause 01/05/2014    Melissa Mcgrath, PTA 08/05/2021, 4:17 PM  [facility] @ [address] [place], [place], [address] Phone: [phone]   Fax:  [phone]  Name: Melissa Mcgrath MRN: [id number] Date of Birth: Oct 26, 1960

## 2021-08-07 ENCOUNTER — Other Ambulatory Visit: Payer: Self-pay

## 2021-08-07 ENCOUNTER — Encounter: Payer: Self-pay | Admitting: Hematology and Oncology

## 2021-08-07 ENCOUNTER — Encounter: Payer: Self-pay | Admitting: Rehabilitation

## 2021-08-07 ENCOUNTER — Ambulatory Visit: Payer: No Typology Code available for payment source | Admitting: Rehabilitation

## 2021-08-07 DIAGNOSIS — R293 Abnormal posture: Secondary | ICD-10-CM

## 2021-08-07 DIAGNOSIS — M25611 Stiffness of right shoulder, not elsewhere classified: Secondary | ICD-10-CM

## 2021-08-07 DIAGNOSIS — C50411 Malignant neoplasm of upper-outer quadrant of right female breast: Secondary | ICD-10-CM

## 2021-08-07 DIAGNOSIS — Z483 Aftercare following surgery for neoplasm: Secondary | ICD-10-CM

## 2021-08-07 DIAGNOSIS — M25612 Stiffness of left shoulder, not elsewhere classified: Secondary | ICD-10-CM

## 2021-08-07 NOTE — Therapy (Signed)
[facility] @ [place], [place], [address] Phone: [phone]   Fax:  [phone]  Physical Therapy Treatment  Patient Details  Name: Melissa Mcgrath MRN: [id number] Date of Birth: 01-31-1961 Referring Provider (PT): Dr. Stark Klein   Encounter Date: 08/07/2021   PT End of Session - 08/07/21 1302     Visit Number 11    Number of Visits 19    Date for PT Re-Evaluation 09/18/21    PT Start Time 0109    PT Stop Time 1333    PT Time Calculation (min) 30 min    Activity Tolerance Patient tolerated treatment well    Behavior During Therapy [facility] for tasks assessed/performed             Past Medical History:  Diagnosis Date   Anemia    Depression 01/05/2014   Facial basal cell cancer    Family history of breast cancer    Family history of melanoma    Family history of ovarian cancer    Family history of pancreatic cancer    Menopause 01/05/2014   Vaginal atrophy 11/09/2014    Past Surgical History:  Procedure Laterality Date   BILATERAL TOTAL MASTECTOMY WITH AXILLARY LYMPH NODE DISSECTION Right 04/25/2021   Procedure: RIGHT TOTAL MASTECTOMY WITH RIGHT AXILLARY LYMPH NODE DISSECTION;  Surgeon: Stark Klein, MD;  Location: [facility];  Service: General;  Laterality: Right;   BREAST ENHANCEMENT SURGERY     BREAST IMPLANT REMOVAL Bilateral 04/25/2021   Procedure: REMOVAL BREAST IMPLANTS;  Surgeon: Stark Klein, MD;  Location: [facility];  Service: General;  Laterality: Bilateral;   CARPAL TUNNEL RELEASE Right    PORTACATH PLACEMENT N/A 09/17/2020   Procedure: INSERTION PORT-A-CATH;  Surgeon: Stark Klein, MD;  Location: [facility];  Service: General;  Laterality: N/A;   refractive lensectomy Bilateral    TOTAL MASTECTOMY Left 04/25/2021   Procedure: LEFT TOTAL MASTECTOMY;  Surgeon: Stark Klein, MD;  Location: [facility];  Service: General;  Laterality: Left;    There were no vitals filed for this  visit.   Subjective Assessment - 08/07/21 1303     Subjective I did really bad at the end of radiation. I was really burned.    Pertinent History Patient was diagnosed on 08/21/2020 with right grade III invasive ductal carcinoma breast cancer. It measures 2.1 cm and is located in the upper outer quadrant. It is ER/PR positive and HER2 negative with a Ki67 of 40%. She underwent a bilateral mastectomy with a right axillary node dissection (24 negative axillary nodes and 2 negative intramammary nodes) on 04/25/2021.    Currently in Pain? No/denies                [facility] PT Assessment - 08/07/21 0001       Observation/Other Assessments   Observations Rt chest very red with appearance of healing blisters    Skin Integrity healing from radiation      AROM   Right Shoulder Flexion 152 Degrees    Right Shoulder ABduction 165 Degrees    Right Shoulder External Rotation 92 Degrees    Left Shoulder Flexion 165 Degrees    Left Shoulder ABduction 178 Degrees    Left Shoulder External Rotation 95 Degrees      PROM   Overall PROM  --   Rt PROM end range tightness in pectoralis  PT Long Term Goals - 08/07/21 1723       PT LONG TERM GOAL #1   Title Pt will maintain bil shoulder PROM moving into time of most tightness post radiation therapy    Time 6    Period Weeks    Status New      PT LONG TERM GOAL #2   Title Pt will be ind with final HEP to continue to build strength    Time 6    Period Weeks    Status New      PT LONG TERM GOAL #3   Title NA      PT LONG TERM GOAL #4   Title NA      PT LONG TERM GOAL #5   Title NA                   Plan - 08/07/21 1718     Clinical Impression Statement Pt arrives to prescheduled post radiation recheck.  Pt had a very hard course of radiation with skin blistering and pain.  Overall AROM has not decreased but pt continues with feelings of tightness especially in  the Rt pectoralis and anteiror chest region.  We will continue with MT to decrease tightness in the Rt chest and continue to progress TE to keep up strength as pt returns to barn and farm activities.    Stability/Clinical Decision Making Stable/Uncomplicated    PT Frequency 2x / week    PT Duration 6 weeks    PT Treatment/Interventions ADLs/Self Care Home Management;Manual lymph drainage;Manual techniques;Patient/family education;Passive range of motion;Therapeutic exercise;Therapeutic activities;Scar mobilization    PT Next Visit Plan PROM bil shoulder, STM as needed, stretches for chest opening, general strength.    Cont every 3 month L-Dex screens for up to 2 years from her ALND (~04/26/2023)    PT Home Exercise Plan ABC handout    Consulted and Agree with Plan of Care Patient             Patient will benefit from skilled therapeutic intervention in order to improve the following deficits and impairments:  Postural dysfunction, Decreased range of motion, Decreased knowledge of precautions, Decreased scar mobility, Impaired UE functional use, Pain, Decreased strength, Increased fascial restricitons  Visit Diagnosis: Aftercare following surgery for neoplasm  Malignant neoplasm of upper-outer quadrant of right breast in female, estrogen receptor positive ([place])  Stiffness of left shoulder, not elsewhere classified  Abnormal posture  Stiffness of right shoulder, not elsewhere classified     Problem List Patient Active Problem List   Diagnosis Date Noted   Breast cancer metastasized to axillary lymph node, right ([place]) 04/25/2021   Port-A-Cath in place 10/04/2020   Genetic testing 09/18/2020   History of augmentation mammoplasty 08/31/2020   Family history of breast cancer    Family history of pancreatic cancer    Family history of melanoma    Family history of ovarian cancer    Malignant neoplasm of upper-outer quadrant of right breast in female, estrogen receptor positive ([place])  08/28/2020   Vaginal atrophy 11/09/2014   Menopause 01/05/2014    Stark Bray, PT 08/07/2021, 5:25 PM  [facility] @ [place], [place], [address] Phone: [phone]   Fax:  [phone]  Name: Melissa Mcgrath MRN: [id number] Date of Birth: 02/26/1961

## 2021-08-08 ENCOUNTER — Encounter: Payer: Self-pay | Admitting: Radiation Oncology

## 2021-08-13 ENCOUNTER — Other Ambulatory Visit: Payer: Self-pay

## 2021-08-13 ENCOUNTER — Telehealth: Payer: Self-pay

## 2021-08-13 DIAGNOSIS — R051 Acute cough: Secondary | ICD-10-CM

## 2021-08-13 NOTE — Telephone Encounter (Signed)
Called patient to follow up due to patient having  a dry cough. Patient reports cough has improved slightly, denies any shortness of breath or fever. Patient reports wheezing. Offered to arrange chest xray at [facility], patient requested to have chest xray on 08/26/21 at [facility].

## 2021-08-14 ENCOUNTER — Encounter: Payer: Self-pay | Admitting: Hematology and Oncology

## 2021-08-19 ENCOUNTER — Encounter: Payer: Self-pay | Admitting: Radiology

## 2021-08-23 ENCOUNTER — Telehealth: Payer: Self-pay | Admitting: *Deleted

## 2021-08-23 NOTE — Telephone Encounter (Signed)
CALLED PATIENT TO ASK ABOUT COMING FOR CHEST X-RAY ON 08-26-21, PATIENT STATED THAT SHE WILL COME ON 08-26-21 @ 10 AM, NOTIFIED RADIOLOGY THAT SHE WOULD BE THERE @ 10 AM FOR CHEST X-RAY ?

## 2021-08-24 NOTE — Progress Notes (Signed)
?Radiation Oncology         [phone] ?________________________________ ? ?Name: Melissa Mcgrath MRN: [id number]  ?Date: 08/26/2021  DOB: 09-08-1960 ? ?Follow-Up Visit Note ? ?CC: Pablo Lawrence, NP  Magrinat, Virgie Dad, MD ? ?  ICD-10-CM   ?1. Malignant neoplasm of upper-outer quadrant of right breast in female, estrogen receptor positive ([place])  C50.411   ? Z17.0   ?  ? ? ?Diagnosis: Clinical Stage IIIA (cT2, cN2, cM0) Right Breast UOQ, Invasive Ductal Carcinoma and High-grade DCIS, ER+ / PR+ / Her2-, Grade 3 ? ?Interval Since Last Radiation:  1 month and 3 days  ? ?Intent: Curative ? ?Radiation Treatment Dates: 06/11/2021 through 07/26/2021 ?Site Technique Total Dose (Gy) Dose per Fx (Gy) Completed Fx Beam Energies  ?Chest Wall, Right: CW_R 3D 50/50 2 25/25 6X, 10X  ?Chest Wall, Right: CW_R_Bst Electron 10/10 2 5/5 6E  ?Sclav-RT: SCV_R 3D 50/50 2 25/25 6X, 10X  ? ? ?Narrative:  The patient returns today for routine follow-up. The patient tolerated radiation therapy relatively well. On the date of her final weekly treatment check on 07/24/21, the patient reported fatigue, hyperpigmentation, peeling, itching, and issues with ROM due to swelling and skin breakdown. Physical exam performed on that same date confirmed significant erythema and dry desquamation in the right chest wall area and right axillary area. Some areas of moist desquamation were also noted. For her skin related irration, the patient used silvadene, neosporin, and hydrocortisone.    ? ?In the interval since her initial consultation date of 05/20/21, the patient followed up with Dr. Lindi Adie on 07/10/21. Antiestrogen treatment options were discussed with the patient, and following discussion of the risks and benefits, the patient agreed to proceed with adjuvant antiestrogen therapy consisting of letrozole, CDK 4, and 6 inhibitor abemaciclib. Planned treatment duration is for 7 years. (Dr. 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                             ? ?The patient has continued meeting with OP rehab bi-weekly. Per her most recent PT visit on 08/07/21, the patient was noted to have ongoing blistering and pain in the radiation field. Though the patient denied decreased ROM, she endorsed feelings of tightness especially in the right pectoralis and anteiror chest region.  ? ?She reports her skin reaction has now improved significantly.  She does not have any significant itching at this time.  She continues to have some sensitivity to the right chest wall area.  She denies any swelling in her right arm or hand.   ? ?She has been having problems with the mild dry cough that started towards the end of her radiation therapy and has persisted.  This is primarily in the early morning hours upon awakening.  She denies any shortness of breath.  She denies any fever or chills. ? ?Earlier today the patient underwent a chest x-ray to assess for potential radiation pneumonitis.  There were no changes noted on chest x-ray.  No other acute issues either. ? ? ?Allergies:  has No Known Allergies. ? ?Meds: ?Current Outpatient Medications  ?Medication Sig Dispense Refill  ? Cholecalciferol (D3 ADULT PO) Take 1 tablet by mouth daily.    ? ELDERBERRY PO Take 1 tablet by mouth daily.    ? letrozole (FEMARA) 2.5 MG tablet Take 1 tablet (2.5 mg total) by mouth daily. 90 tablet 3  ?  Multiple Vitamin (MULTIVITAMIN) tablet Take 1 tablet by mouth daily.    ? TURMERIC PO Take 1 tablet by mouth daily.    ? benzonatate (TESSALON) 100 MG capsule Take 1 capsule (100 mg total) by mouth 3 (three) times daily as needed for cough. (Patient not taking: Reported on 08/26/2021) 30 capsule 0  ? BIOTIN PO Take 1 tablet by mouth daily. (Patient not taking: Reported on 08/26/2021)    ? ?No current facility-administered medications for this encounter.  ? ? ?Physical Findings: ?The patient is in no acute distress. Patient is alert and oriented. ? height  is 5' 8"  (1.727 m) and weight is 140 lb (63.5 kg). Her temperature is 97.6 ?F (36.4 ?C). Her blood pressure is 114/72 and her pulse is 87. Her respiration is 20 and oxygen saturation is 100%. .  No significant changes. Lungs are clear to auscultation bilaterally. Heart has regular rate and rhythm. No palpable cervical, supraclavicular, or axillary adenopathy. Abdomen soft, non-tender, normal bowel sounds. ? ?Left chest wall area reveals mastectomy scar which is healed well without signs of drainage or infection.  No signs of recurrence along the left chest wall. ?Right chest wall area shows a mastectomy scar which is healed well.  There continues to be some area of erythema along the upper inner aspect of the chest wall.  No skin breakdown.  No palpable or visible signs of recurrence. ? ? ?Lab Findings: ?Lab Results  ?Component Value Date  ? WBC 4.7 07/10/2021  ? HGB 12.6 07/10/2021  ? HCT 37.0 07/10/2021  ? MCV 94.4 07/10/2021  ? PLT 199 07/10/2021  ? ? ?Radiographic Findings: ?DG Chest 2 View ? ?Result Date: 08/26/2021 ?CLINICAL DATA:  cough and wheezing for approximately 2 months EXAM: CHEST - 2 VIEW COMPARISON:  09/14/2020 chest CT FINDINGS: Mild pectus excavatum deformity. Left Port-A-Cath tip at low SVC. Midline trachea. Normal heart size and mediastinal contours. No pleural effusion or pneumothorax. Right axillary node dissection. Clear lungs. Interval breast implant removal. IMPRESSION: No acute cardiopulmonary disease. Electronically Signed   By: Abigail Miyamoto M.D.   On: 08/26/2021 10:55   ? ?Impression: Clinical Stage IIIA (cT2, cN2, cM0) Right Breast UOQ, Invasive Ductal Carcinoma and High-grade DCIS, ER+ / PR+ / Her2-, Grade 3 ? ?The patient is recovering from the effects of radiation.  She did have a brisk reaction at the end of her treatment but this issue has improved significantly.  In reviewing the patient's chart she does have Tessalon Perles for her cough which she was not aware of.  Recommended she  consider using this for her mild dry cough which is primarily in the morning.  Also offered potential short course of steroids but she does not feel this is necessary at this time. ? ?Plan: As needed follow-up in radiation oncology.  She will call if her coughing worsens and we will proceed with a short course of steroids.  Otherwise she will continue close follow-up in medical oncology. ? ? ?____________________________________ ? ?Blair Promise, PhD, MD ? ? ?This document serves as a record of services personally performed by Gery Pray, MD. It was created on his behalf by Roney Mans, a trained medical scribe. The creation of this record is based on the scribe's personal observations and the provider's statements to them. This document has been checked and approved by the attending provider. ? ?

## 2021-08-24 NOTE — Progress Notes (Incomplete)
Radiation Oncology         [phone] ________________________________  Patient Name: Melissa Mcgrath MRN: [id number] DOB: 02/27/1961 Referring Physician: Lurline Del (Profile Not Attached) Date of Service: 07/26/2021 [facility], [place]                                                        End Of Treatment Note  Diagnoses: C50.411-Malignant neoplasm of upper-outer quadrant of right female breast Z17.0-Estrogen receptor positive status [ER+]  Cancer Staging: Clinical Stage IIIA (cT2, cN2, cM0) Right Breast UOQ, Invasive Ductal Carcinoma and High-grade DCIS, ER+ / PR+ / Her2-, Grade 3  Intent: Curative  Radiation Treatment Dates: 06/11/2021 through 07/26/2021 Site Technique Total Dose (Gy) Dose per Fx (Gy) Completed Fx Beam Energies  Chest Wall, Right: CW_R 3D 50/50 2 25/25 6X, 10X  Chest Wall, Right: CW_R_Bst Electron 10/10 2 5/5 6E  Sclav-RT: SCV_R 3D 50/50 2 25/25 6X, 10X   Narrative: The patient tolerated radiation therapy relatively well. On the date of her final weekly treatment check on 07/24/21, the patient reported fatigue, hyperpigmentation, peeling, itching, and issues with ROM due to swelling and skin breakdown. Physical exam performed on that same date confirmed significant erythema and dry desquamation in the right chest wall area and right axillary area. Some areas of moist desquamation were also noted. For her skin related irration, the patient used silvadene, neosporin, and hydrocortisone.   Plan: The patient will follow-up with radiation oncology in one month or sooner if her skin does not show adequate healing.  ________________________________________________ -----------------------------------  Blair Promise, PhD, MD  This document serves as a record of services personally performed by Gery Pray, MD. It was created on his behalf by Roney Mans, a trained medical scribe. The creation of this record is based on the scribe's personal  observations and the provider's statements to them. This document has been checked and approved by the attending provider.

## 2021-08-26 ENCOUNTER — Ambulatory Visit (HOSPITAL_COMMUNITY)
Admission: RE | Admit: 2021-08-26 | Discharge: 2021-08-26 | Disposition: A | Discharge status: Home / Self Care | Payer: No Typology Code available for payment source | Source: Ambulatory Visit | Attending: Radiation Oncology | Admitting: Radiation Oncology

## 2021-08-26 ENCOUNTER — Other Ambulatory Visit: Payer: Self-pay

## 2021-08-26 ENCOUNTER — Encounter: Payer: Self-pay | Admitting: Radiation Oncology

## 2021-08-26 ENCOUNTER — Ambulatory Visit
Admission: RE | Admit: 2021-08-26 | Discharge: 2021-08-26 | Disposition: A | Discharge status: Home / Self Care | Payer: No Typology Code available for payment source | Source: Ambulatory Visit | Attending: Radiation Oncology | Admitting: Radiation Oncology

## 2021-08-26 DIAGNOSIS — Z170 Estrogen receptor positive status [ER+]: Secondary | ICD-10-CM | POA: Insufficient documentation

## 2021-08-26 DIAGNOSIS — C50411 Malignant neoplasm of upper-outer quadrant of right female breast: Secondary | ICD-10-CM | POA: Insufficient documentation

## 2021-08-26 DIAGNOSIS — Z923 Personal history of irradiation: Secondary | ICD-10-CM | POA: Diagnosis not present

## 2021-08-26 DIAGNOSIS — R051 Acute cough: Secondary | ICD-10-CM

## 2021-08-26 DIAGNOSIS — Z79811 Long term (current) use of aromatase inhibitors: Secondary | ICD-10-CM | POA: Insufficient documentation

## 2021-08-26 DIAGNOSIS — R059 Cough, unspecified: Secondary | ICD-10-CM | POA: Insufficient documentation

## 2021-08-26 NOTE — Progress Notes (Signed)
[name] is here today for follow up post radiation to the breast. ? ? Breast Side: Right chest wall ? ? ?They completed their radiation on: 07/26/21 ? ?Does the patient complain of any of the following: ?Post radiation skin issues: Continues to have some redness. ?Breast Tenderness: yes ?Breast Swelling: no ?Lymphadema: no ?Range of Motion limitations: no, demonstrates full range of motion.  ?Fatigue post radiation: no ?Appetite good/fair/poor: good ? ?Additional comments if applicable: Patient continues to have a dry cough. Reports improvements with shortness of breath. Patient had chest xray today. ? ?Vitals:  ? 08/26/21 1122  ?BP: 114/72  ?Pulse: 87  ?Resp: 20  ?Temp: 97.6 ?F (36.4 ?C)  ?SpO2: 100%  ?Weight: 140 lb (63.5 kg)  ?Height: '5\' 8"'$  (1.727 m)  ?  ?

## 2021-08-30 ENCOUNTER — Other Ambulatory Visit: Payer: Self-pay | Admitting: General Surgery

## 2021-09-03 ENCOUNTER — Ambulatory Visit: Payer: No Typology Code available for payment source | Attending: General Surgery | Admitting: Rehabilitation

## 2021-09-03 ENCOUNTER — Encounter: Payer: Self-pay | Admitting: Rehabilitation

## 2021-09-03 ENCOUNTER — Other Ambulatory Visit: Payer: Self-pay

## 2021-09-03 DIAGNOSIS — M25611 Stiffness of right shoulder, not elsewhere classified: Secondary | ICD-10-CM | POA: Diagnosis present

## 2021-09-03 DIAGNOSIS — C50411 Malignant neoplasm of upper-outer quadrant of right female breast: Secondary | ICD-10-CM | POA: Insufficient documentation

## 2021-09-03 DIAGNOSIS — M25612 Stiffness of left shoulder, not elsewhere classified: Secondary | ICD-10-CM | POA: Diagnosis present

## 2021-09-03 DIAGNOSIS — Z483 Aftercare following surgery for neoplasm: Secondary | ICD-10-CM | POA: Diagnosis not present

## 2021-09-03 DIAGNOSIS — R293 Abnormal posture: Secondary | ICD-10-CM | POA: Insufficient documentation

## 2021-09-03 DIAGNOSIS — Z170 Estrogen receptor positive status [ER+]: Secondary | ICD-10-CM | POA: Diagnosis present

## 2021-09-03 NOTE — Therapy (Signed)
?OUTPATIENT PHYSICAL THERAPY TREATMENT NOTE ? ? ?Patient Name: Melissa Mcgrath ?MRN: [id number] ?DOB:[birth year], 61 y.o., female ?Today's Date: 09/03/2021 ? ?PCP: Pablo Lawrence, NP ?REFERRING PROVIDER: Pablo Lawrence, NP ? ? PT End of Session - 09/03/21 1555   ? ? Visit Number 12   ? Number of Visits 19   ? Date for PT Re-Evaluation 09/18/21   ? PT Start Time 1500   ? PT Stop Time 3016   ? PT Time Calculation (min) 49 min   ? Activity Tolerance Patient tolerated treatment well   ? Behavior During Therapy [facility] for tasks assessed/performed   ? ?  ?  ? ?  ? ? ?Past Medical History:  ?Diagnosis Date  ? Anemia   ? Depression 01/05/2014  ? Facial basal cell cancer   ? Family history of breast cancer   ? Family history of melanoma   ? Family history of ovarian cancer   ? Family history of pancreatic cancer   ? History of radiation therapy   ? right chest wall and subclavian 06/11/2021-07/26/2021  Dr Gery Pray  ? Menopause 01/05/2014  ? Vaginal atrophy 11/09/2014  ? ?Past Surgical History:  ?Procedure Laterality Date  ? BILATERAL TOTAL MASTECTOMY WITH AXILLARY LYMPH NODE DISSECTION Right 04/25/2021  ? Procedure: RIGHT TOTAL MASTECTOMY WITH RIGHT AXILLARY LYMPH NODE DISSECTION;  Surgeon: Stark Klein, MD;  Location: [place];  Service: General;  Laterality: Right;  ? BREAST ENHANCEMENT SURGERY    ? BREAST IMPLANT REMOVAL Bilateral 04/25/2021  ? Procedure: REMOVAL BREAST IMPLANTS;  Surgeon: Stark Klein, MD;  Location: [place];  Service: General;  Laterality: Bilateral;  ? CARPAL TUNNEL RELEASE Right   ? PORTACATH PLACEMENT N/A 09/17/2020  ? Procedure: INSERTION PORT-A-CATH;  Surgeon: Stark Klein, MD;  Location: [place];  Service: General;  Laterality: N/A;  ? refractive lensectomy Bilateral   ? TOTAL MASTECTOMY Left 04/25/2021  ? Procedure: LEFT TOTAL MASTECTOMY;  Surgeon: Stark Klein, MD;  Location: [place];  Service: General;  Laterality: Left;  ? ?Patient Active Problem List  ? Diagnosis Date Noted  ?  Breast cancer metastasized to axillary lymph node, right ([place]) 04/25/2021  ? Port-A-Cath in place 10/04/2020  ? Genetic testing 09/18/2020  ? History of augmentation mammoplasty 08/31/2020  ? Family history of breast cancer   ? Family history of pancreatic cancer   ? Family history of melanoma   ? Family history of ovarian cancer   ? Malignant neoplasm of upper-outer quadrant of right breast in female, estrogen receptor positive ([place]) 08/28/2020  ? Vaginal atrophy 11/09/2014  ? Menopause 01/05/2014  ? ? ?REFERRING DIAG: Rt breast cancer ? ?THERAPY DIAG:  ?Aftercare following surgery for neoplasm ? ?Malignant neoplasm of upper-outer quadrant of right breast in female, estrogen receptor positive ([place]) ? ?Stiffness of right shoulder, not elsewhere classified ? 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 It measures 2.1 cm and is located in the upper outer quadrant. It is ER/PR positive and HER2 negative with a Ki67 of 40%. She underwent a bilateral mastectomy with a right axillary node dissection (24 negative axillary nodes and 2 negative intramammary nodes) on 04/25/2021. ? ?PRECAUTIONS: Rt UE lymphedema risk ? ?SUBJECTIVE: I will be having the left side skin removal and port out 10/15/21 ? ?PAIN:  ?Are you having pain? No ? ?TODAY'S TREATMENT:  ?MT: STM and MFR to the Rt upper quadrant with cording release in various positions.  Work into  flexion, abduction, ER,  ? ? ?HOME EXERCISE PROGRAM: ?Walking and strength ABC ? ? ? Clinical Impression Statement Overall skin healing much improved.  3 thin cords noted in axilla into upper arm,  Focused on PROM, MFR< and STM to the upper Rt quadrant.    ?  Stability/Clinical Decision Making Stable/Uncomplicated   ?  PT Frequency 2x / week   ?  PT Duration 6 weeks   ?  PT Treatment/Interventions ADLs/Self Care Home Management;Manual lymph drainage;Manual techniques;Patient/family education;Passive range of  motion;Therapeutic exercise;Therapeutic activities;Scar mobilization   ?  PT Next Visit Plan PROM bil shoulder, STM as needed, stretches for chest opening, general strength.    Cont every 3 month L-Dex screens for up to 2 years from her ALND (~04/26/2023)   ?  PT Home Exercise Plan ABC handout   ?  Consulted and Agree with Plan of Care Patient   ?  ? ? ? PT Long Term Goals - 08/07/21 1723   ? ?  ? PT LONG TERM GOAL #1  ? Title Pt will maintain bil shoulder PROM moving into time of most tightness post radiation therapy   ? Time 6   ? Period Weeks   ? Status New   ?  ? PT LONG TERM GOAL #2  ? Title Pt will be ind with final HEP to continue to build strength   ? Time 6   ? Period Weeks   ? Status New   ?  ? PT LONG TERM GOAL #3  ? Title NA   ?  ? PT LONG TERM GOAL #4  ? Title NA   ?  ? PT LONG TERM GOAL #5  ? Title NA   ? ?  ?  ? ?  ? ? ? ? ? ? ?Stark Bray, PT ?09/03/2021, 3:56 PM ? ?   ?

## 2021-09-05 ENCOUNTER — Other Ambulatory Visit: Payer: Self-pay

## 2021-09-05 ENCOUNTER — Ambulatory Visit: Payer: No Typology Code available for payment source | Admitting: Rehabilitation

## 2021-09-05 DIAGNOSIS — Z483 Aftercare following surgery for neoplasm: Secondary | ICD-10-CM | POA: Diagnosis not present

## 2021-09-05 NOTE — Therapy (Signed)
?OUTPATIENT PHYSICAL THERAPY TREATMENT NOTE ? ? ?Patient Name: Melissa Mcgrath ?MRN: [id number] ?DOB:1961/05/18, 61 y.o., female ?Today's Date: 09/05/2021 ? ?PCP: Pablo Lawrence, NP ?REFERRING PROVIDER: Stark Klein, MD ? ? PT End of Session - 09/05/21 1215   ? ? Visit Number 13   ? Number of Visits 19   ? Date for PT Re-Evaluation 09/18/21   ? PT Start Time 1100   ? PT Stop Time 1150   ? PT Time Calculation (min) 50 min   ? Activity Tolerance Patient tolerated treatment well   ? Behavior During Therapy [facility] for tasks assessed/performed   ? ?  ?  ? ?  ? ? ? ?Past Medical History:  ?Diagnosis Date  ? Anemia   ? Depression 01/05/2014  ? Facial basal cell cancer   ? Family history of breast cancer   ? Family history of melanoma   ? Family history of ovarian cancer   ? Family history of pancreatic cancer   ? History of radiation therapy   ? right chest wall and subclavian 06/11/2021-07/26/2021  Dr Gery Pray  ? Menopause 01/05/2014  ? Vaginal atrophy 11/09/2014  ? ?Past Surgical History:  ?Procedure Laterality Date  ? BILATERAL TOTAL MASTECTOMY WITH AXILLARY LYMPH NODE DISSECTION Right 04/25/2021  ? Procedure: RIGHT TOTAL MASTECTOMY WITH RIGHT AXILLARY LYMPH NODE DISSECTION;  Surgeon: Stark Klein, MD;  Location: [place];  Service: General;  Laterality: Right;  ? BREAST ENHANCEMENT SURGERY    ? BREAST IMPLANT REMOVAL Bilateral 04/25/2021  ? Procedure: REMOVAL BREAST IMPLANTS;  Surgeon: Stark Klein, MD;  Location: [place];  Service: General;  Laterality: Bilateral;  ? CARPAL TUNNEL RELEASE Right   ? PORTACATH PLACEMENT N/A 09/17/2020  ? Procedure: INSERTION PORT-A-CATH;  Surgeon: Stark Klein, MD;  Location: [place];  Service: General;  Laterality: N/A;  ? refractive lensectomy Bilateral   ? TOTAL MASTECTOMY Left 04/25/2021  ? Procedure: LEFT TOTAL MASTECTOMY;  Surgeon: Stark Klein, MD;  Location: [place];  Service: General;  Laterality: Left;  ? ?Patient Active Problem List  ? Diagnosis Date Noted  ?  Breast cancer metastasized to axillary lymph node, right ([place]) 04/25/2021  ? Port-A-Cath in place 10/04/2020  ? Genetic testing 09/18/2020  ? History of augmentation mammoplasty 08/31/2020  ? Family history of breast cancer   ? Family history of pancreatic cancer   ? Family history of melanoma   ? Family history of ovarian cancer   ? Malignant neoplasm of upper-outer quadrant of right breast in female, estrogen receptor positive ([place]) 08/28/2020  ? Vaginal atrophy 11/09/2014  ? Menopause 01/05/2014  ? ? ?REFERRING DIAG: Rt breast cancer ? ?THERAPY DIAG:  ?Aftercare following surgery for neoplasm ? ?Stiffness of right shoulder, not elsewhere classified ? ?Malignant neoplasm of upper-outer quadrant of right breast in female, estrogen receptor positive ([place]) ? ?Stiffness of left shoulder, not elsewhere classified ? ?Abnormal posture ? 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 It measures 2.1 cm and is located in the upper outer quadrant. It is ER/PR positive and HER2 negative with a Ki67 of 40%. She underwent a bilateral mastectomy with a right axillary node dissection (24 negative axillary nodes and 2 negative intramammary nodes) on 04/25/2021. ? ?PRECAUTIONS: Rt UE lymphedema risk ? ?SUBJECTIVE: I just love how the massage helps ? ?PAIN:  ?Are you having pain? No ? ?TODAY'S TREATMENT:  ?MT: STM and MFR to the Rt upper quadrant with cording release  in various positions.  Work into flexion, abduction, ER,  ?TE: supine on large foam roll series: pectoralis stretch x 60", ER/pectoralis stretch x 60" , alternating flexion x 10 bil, pro/ret x 10 bil, snow angel abduction x 5 ? ? ?HOME EXERCISE PROGRAM: ?Walking and strength ABC ? ? ? Clinical Impression Statement Added foam roll series today which pt enjoyed and felt good stretch.  Pt a bit more shaky today and reported her blood sugar felt a bit low.    ?  Stability/Clinical Decision Making  Stable/Uncomplicated   ?  PT Frequency 2x / week   ?  PT Duration 6 weeks   ?  PT Treatment/Interventions ADLs/Self Care Home Management;Manual lymph drainage;Manual techniques;Patient/family education;Passive range of motion;Therapeutic exercise;Therapeutic activities;Scar mobilization   ?  PT Next Visit Plan PROM bil shoulder, STM as needed, stretches for chest opening, general strength.    Cont every 3 month L-Dex screens for up to 2 years from her ALND (~04/26/2023)   ?  PT Home Exercise Plan ABC handout   ?  Consulted and Agree with Plan of Care Patient   ?  ? ? ? PT Long Term Goals - 08/07/21 1723   ? ?  ? PT LONG TERM GOAL #1  ? Title Pt will maintain bil shoulder PROM moving into time of most tightness post radiation therapy   ? Time 6   ? Period Weeks   ? Status New   ?  ? PT LONG TERM GOAL #2  ? Title Pt will be ind with final HEP to continue to build strength   ? Time 6   ? Period Weeks   ? Status New   ?  ? PT LONG TERM GOAL #3  ? Title NA   ?  ? PT LONG TERM GOAL #4  ? Title NA   ?  ? PT LONG TERM GOAL #5  ? Title NA   ? ?  ?  ? ?  ? ? ? ? ? ? ?Stark Bray, PT ?09/05/2021, 12:16 PM ? ?   ?

## 2021-09-06 ENCOUNTER — Telehealth: Payer: Self-pay | Admitting: *Deleted

## 2021-09-10 ENCOUNTER — Ambulatory Visit: Payer: No Typology Code available for payment source

## 2021-09-10 ENCOUNTER — Inpatient Hospital Stay: Payer: No Typology Code available for payment source | Attending: Adult Health | Admitting: Adult Health

## 2021-09-10 ENCOUNTER — Encounter: Payer: Self-pay | Admitting: Adult Health

## 2021-09-10 ENCOUNTER — Other Ambulatory Visit: Payer: Self-pay

## 2021-09-10 VITALS — BP 101/45 | HR 73 | Temp 97.4°F | Resp 18 | Ht 68.0 in | Wt 141.0 lb

## 2021-09-10 DIAGNOSIS — Z9221 Personal history of antineoplastic chemotherapy: Secondary | ICD-10-CM | POA: Diagnosis not present

## 2021-09-10 DIAGNOSIS — C50411 Malignant neoplasm of upper-outer quadrant of right female breast: Secondary | ICD-10-CM | POA: Insufficient documentation

## 2021-09-10 DIAGNOSIS — E2839 Other primary ovarian failure: Secondary | ICD-10-CM | POA: Diagnosis not present

## 2021-09-10 DIAGNOSIS — Z923 Personal history of irradiation: Secondary | ICD-10-CM | POA: Diagnosis not present

## 2021-09-10 DIAGNOSIS — M25612 Stiffness of left shoulder, not elsewhere classified: Secondary | ICD-10-CM

## 2021-09-10 DIAGNOSIS — R293 Abnormal posture: Secondary | ICD-10-CM

## 2021-09-10 DIAGNOSIS — Z79811 Long term (current) use of aromatase inhibitors: Secondary | ICD-10-CM | POA: Insufficient documentation

## 2021-09-10 DIAGNOSIS — Z170 Estrogen receptor positive status [ER+]: Secondary | ICD-10-CM | POA: Insufficient documentation

## 2021-09-10 DIAGNOSIS — Z483 Aftercare following surgery for neoplasm: Secondary | ICD-10-CM

## 2021-09-10 DIAGNOSIS — Z9013 Acquired absence of bilateral breasts and nipples: Secondary | ICD-10-CM | POA: Diagnosis not present

## 2021-09-10 DIAGNOSIS — M25611 Stiffness of right shoulder, not elsewhere classified: Secondary | ICD-10-CM

## 2021-09-10 NOTE — Therapy (Signed)
?OUTPATIENT PHYSICAL THERAPY TREATMENT NOTE ? ? ?Patient Name: Melissa Mcgrath ?MRN: [id number] ?DOB:Feb 21, 1961, 61 y.o., female ?Today's Date: 09/10/2021 ? ?PCP: Pablo Lawrence, NP ?REFERRING PROVIDER: Stark Klein, MD ? ? PT End of Session - 09/10/21 1501   ? ? Visit Number 14   ? Number of Visits 19   ? Date for PT Re-Evaluation 09/18/21   ? PT Start Time 1502   ? PT Stop Time 8527   ? PT Time Calculation (min) 50 min   ? Activity Tolerance Patient tolerated treatment well   ? Behavior During Therapy [facility] for tasks assessed/performed   ? ?  ?  ? ?  ? ? ? ?Past Medical History:  ?Diagnosis Date  ? Anemia   ? Depression 01/05/2014  ? Facial basal cell cancer   ? Family history of breast cancer   ? Family history of melanoma   ? Family history of ovarian cancer   ? Family history of pancreatic cancer   ? History of radiation therapy   ? right chest wall and subclavian 06/11/2021-07/26/2021  Dr Gery Pray  ? Menopause 01/05/2014  ? Vaginal atrophy 11/09/2014  ? ?Past Surgical History:  ?Procedure Laterality Date  ? BILATERAL TOTAL MASTECTOMY WITH AXILLARY LYMPH NODE DISSECTION Right 04/25/2021  ? Procedure: RIGHT TOTAL MASTECTOMY WITH RIGHT AXILLARY LYMPH NODE DISSECTION;  Surgeon: Stark Klein, MD;  Location: [place];  Service: General;  Laterality: Right;  ? BREAST ENHANCEMENT SURGERY    ? BREAST IMPLANT REMOVAL Bilateral 04/25/2021  ? Procedure: REMOVAL BREAST IMPLANTS;  Surgeon: Stark Klein, MD;  Location: [place];  Service: General;  Laterality: Bilateral;  ? CARPAL TUNNEL RELEASE Right   ? PORTACATH PLACEMENT N/A 09/17/2020  ? Procedure: INSERTION PORT-A-CATH;  Surgeon: Stark Klein, MD;  Location: [place];  Service: General;  Laterality: N/A;  ? refractive lensectomy Bilateral   ? TOTAL MASTECTOMY Left 04/25/2021  ? Procedure: LEFT TOTAL MASTECTOMY;  Surgeon: Stark Klein, MD;  Location: [place];  Service: General;  Laterality: Left;  ? ?Patient Active Problem List  ? Diagnosis Date Noted  ?  Port-A-Cath in place 10/04/2020  ? Genetic testing 09/18/2020  ? History of augmentation mammoplasty 08/31/2020  ? Family history of breast cancer   ? Family history of pancreatic cancer   ? Family history of melanoma   ? Family history of ovarian cancer   ? Malignant neoplasm of upper-outer quadrant of right breast in female, estrogen receptor positive ([place]) 08/28/2020  ? Vaginal atrophy 11/09/2014  ? Menopause 01/05/2014  ? ? ?REFERRING DIAG: Rt breast cancer ? ?THERAPY DIAG:  ?Aftercare following surgery for neoplasm ? ?Stiffness of right shoulder, not elsewhere classified ? ?Malignant neoplasm of upper-outer quadrant of right breast in female, estrogen receptor positive ([place]) ? ?Stiffness of left shoulder, not elsewhere classified ? ?Abnormal posture ? 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 It measures 2.1 cm and is located in the upper outer quadrant. It is ER/PR positive and HER2 negative with a Ki67 of 40%. She underwent a bilateral mastectomy with a right axillary node dissection (24 negative axillary nodes and 2 negative intramammary nodes) on 04/25/2021. ? ?PRECAUTIONS: Rt UE lymphedema risk ? ?SUBJECTIVE: On April 25 I have surgery to remove the excess tissue on the left. The swelling still bothers me at the lateral trunk. The cording is still there.  It feels so good when you work on it. ?PAIN:  ?Are you having  pain? No ? ?TODAY'S TREATMENT:  ? ?09/10/2021 ?MT: STM to right upper quarter with cocoa butter.  MMFR techniques to right UE cording and right chest ?PROM to Right shoulder flexion, scaption, abduction,ER ?TE on pink foam roll: alternating flexion x 10, scaption x 10, horizontal abduction x 10, snow angels x 10 ? ?09/05/2021 ?MT: STM and MFR to the Rt upper quadrant with cording release in various positions.  Work into flexion, abduction, ER,  ?TE: supine on large foam roll series: pectoralis stretch x 60", ER/pectoralis  stretch x 60" , alternating flexion x 10 bil, pro/ret x 10 bil, snow angel abduction x 5 ? ? ?HOME EXERCISE PROGRAM: ?Walking and strength ABC ? ? ? Clinical Impression Statement Pt tender in right pectorals and with increased tissue tension in right scapular area.  Cording still present in axillary region and upper arm.  Pt demonstrated good stability on foam roll, and flet a better stretch doing the exercises after the manual work.  ?  Stability/Clinical Decision Making Stable/Uncomplicated   ?  PT Frequency 2x / week   ?  PT Duration 6 weeks   ?  PT Treatment/Interventions ADLs/Self Care Home Management;Manual lymph drainage;Manual techniques;Patient/family education;Passive range of motion;Therapeutic exercise;Therapeutic activities;Scar mobilization   ?  PT Next Visit Plan PROM bil shoulder, STM as needed, stretches for chest opening, general strength.    Cont every 3 month L-Dex screens for up to 2 years from her ALND (~04/26/2023)   ?  PT Home Exercise Plan ABC handout   ?  Consulted and Agree with Plan of Care Patient   ?  ? ? ? PT Long Term Goals - 08/07/21 1723   ? ?  ? PT LONG TERM GOAL #1  ? Title Pt will maintain bil shoulder PROM moving into time of most tightness post radiation therapy   ? Time 6   ? Period Weeks   ? Status New   ?  ? PT LONG TERM GOAL #2  ? Title Pt will be ind with final HEP to continue to build strength   ? Time 6   ? Period Weeks   ? Status New   ?  ? PT LONG TERM GOAL #3  ? Title NA   ?  ? PT LONG TERM GOAL #4  ? Title NA   ?  ? PT LONG TERM GOAL #5  ? Title NA   ? ?  ?  ? ?  ? ? ? ? ? ? ?Claris Pong, PT ?09/10/2021, 3:58 PM ? ?   ?

## 2021-09-10 NOTE — Progress Notes (Signed)
SURVIVORSHIP VISIT: ? ? ? ?BRIEF ONCOLOGIC HISTORY:  ?Oncology History  ?Malignant neoplasm of upper-outer quadrant of right breast in female, estrogen receptor positive ([place])  ?08/21/2020 Initial Diagnosis  ? right breast upper outer quadrant biopsy 08/21/2020 for a clinical mT2 N1, stage IIA/B invasive ductal carcinoma, grade 3, estrogen receptor positive, progesterone receptor weakly positive, HER-2 not amplified, with an MIB-1 of 40% ?  ?08/29/2020 Cancer Staging  ? Staging form: Breast, AJCC 8th Edition ?- Clinical stage from 08/29/2020: Stage IIIA (cT2, cN2, cM0, G3, ER+, PR+, HER2-) - Signed by Gardenia Phlegm, NP on 09/07/2020 ?Stage prefix: Initial diagnosis ?Stage used in treatment planning: Yes ?National guidelines used in treatment planning: Yes ?Type of national guideline used in treatment planning: NCCN ? ?  ?09/18/2020 - 03/01/2021 Chemotherapy  ? AC X 4 foll by Taxol X 12 ? ?  ?09/18/2020 Genetic Testing  ? Negative genetic testing:  No pathogenic variants detected on the [facility] CancerNext-Expanded + RNAinsight panel. The report date is 09/18/2020.  ? ?The CancerNext-Expanded + RNAinsight gene panel offered by [employer] and includes sequencing and rearrangement analysis for the following 77 genes: AIP, ALK, APC, ATM, AXIN2, BAP1, BARD1, BLM, BMPR1A, BRCA1, BRCA2, BRIP1, CDC73, CDH1, CDK4, CDKN1B, CDKN2A, CHEK2, CTNNA1, DICER1, FANCC, FH, FLCN, GALNT12, KIF1B, LZTR1, MAX, MEN1, MET, MLH1, MSH2, MSH3, MSH6, MUTYH, NBN, NF1, NF2, NTHL1, PALB2, PHOX2B, PMS2, POT1, PRKAR1A, PTCH1, PTEN, RAD51C, RAD51D, RB1, RECQL, RET, SDHA, SDHAF2, SDHB, SDHC, SDHD, SMAD4, SMARCA4, SMARCB1, SMARCE1, STK11, SUFU, TMEM127, TP53, TSC1, TSC2, VHL and XRCC2 (sequencing and deletion/duplication); EGFR, EGLN1, HOXB13, KIT, MITF, PDGFRA, POLD1 and POLE (sequencing only); EPCAM and GREM1 (deletion/duplication only). RNA data is routinely analyzed for use in variant interpretation for all genes. ?  ? Surgery  ? bilateral  mastectomies with right axillary lymph nodes dissection on 04/25/2021 showing ?(A) on the left, no evidence of malignancy ?(B) on the right a residual ypT1c ypN0 invasive ductal carcinoma, grade 3, with a focally positive deep margin ?a total of 24 right axillary and 2 right intramammary lymph nodes were removed, all clear ?  ?06/12/2021 - 07/24/2021 Radiation Therapy  ? Adj XRT ?  ? ? ?INTERVAL HISTORY:  ?Melissa Mcgrath to review her survivorship care plan detailing her treatment course for breast cancer, as well as monitoring long-term side effects of that treatment, education regarding health maintenance, screening, and overall wellness and health promotion.    ? ?Overall, Melissa Mcgrath reports feeling quite well today.  She is taking letrozole daily and is tolerating it well.  She has not undergone colon cancer screening or bone density testing.  She would also like a referral to see a new gynecologist.   ? ?REVIEW OF SYSTEMS:  ?Review of Systems  ?Constitutional:  Negative for appetite change, chills, fatigue, fever and unexpected weight change.  ?HENT:   Negative for hearing loss, lump/mass and trouble swallowing.   ?Eyes:  Negative for eye problems and icterus.  ?Respiratory:  Negative for chest tightness, cough and shortness of breath.   ?Cardiovascular:  Negative for chest pain, leg swelling and palpitations.  ?Gastrointestinal:  Negative for abdominal distention, abdominal pain, constipation, diarrhea, nausea and vomiting.  ?Endocrine: Negative for hot flashes.  ?Genitourinary:  Negative for difficulty urinating.   ?Musculoskeletal:  Negative for arthralgias.  ?Skin:  Negative for itching and rash.  ?Neurological:  Negative for dizziness, extremity weakness, headaches and numbness.  ?Hematological:  Negative for adenopathy. Does not bruise/bleed easily.  ?Psychiatric/Behavioral:  Negative for depression. The patient is  not nervous/anxious.   ?Breast: Denies any new nodularity, masses, tenderness, nipple  changes, or nipple discharge.  ? ? ? ? ?ONCOLOGY TREATMENT TEAM:  ?1. Surgeon:  Dr. Barry Dienes at [facility] ?2. Medical Oncologist: Dr. Lindi Adie  ?3. Radiation Oncologist: Dr. Sondra Come ?  ? ?PAST MEDICAL/SURGICAL HISTORY:  ?Past Medical History:  ?Diagnosis Date  ? Anemia   ? Depression 01/05/2014  ? Facial basal cell cancer   ? Family history of breast cancer   ? Family history of melanoma   ? Family history of ovarian cancer   ? Family history of pancreatic cancer   ? History of radiation therapy   ? right chest wall and subclavian 06/11/2021-07/26/2021  Dr Gery Pray  ? Menopause 01/05/2014  ? Vaginal atrophy 11/09/2014  ? ?Past Surgical History:  ?Procedure Laterality Date  ? BILATERAL TOTAL MASTECTOMY WITH AXILLARY LYMPH NODE DISSECTION Right 04/25/2021  ? Procedure: RIGHT TOTAL MASTECTOMY WITH RIGHT AXILLARY LYMPH NODE DISSECTION;  Surgeon: Stark Klein, MD;  Location: [place];  Service: General;  Laterality: Right;  ? BREAST ENHANCEMENT SURGERY    ? BREAST IMPLANT REMOVAL Bilateral 04/25/2021  ? Procedure: REMOVAL BREAST IMPLANTS;  Surgeon: Stark Klein, MD;  Location: [place];  Service: General;  Laterality: Bilateral;  ? CARPAL TUNNEL RELEASE Right   ? PORTACATH PLACEMENT N/A 09/17/2020  ? Procedure: INSERTION PORT-A-CATH;  Surgeon: Stark Klein, MD;  Location: [place];  Service: General;  Laterality: N/A;  ? refractive lensectomy Bilateral   ? TOTAL MASTECTOMY Left 04/25/2021  ? Procedure: LEFT TOTAL MASTECTOMY;  Surgeon: Stark Klein, MD;  Location: [place];  Service: General;  Laterality: Left;  ? ? ? ?ALLERGIES:  ?No Known Allergies ? ? ?CURRENT MEDICATIONS:  ?Outpatient Encounter Medications as of 09/10/2021  ?Medication Sig  ? ELDERBERRY PO Take 1 tablet by mouth daily.  ? letrozole (FEMARA) 2.5 MG tablet Take 1 tablet (2.5 mg total) by mouth daily.  ? TURMERIC PO Take 1 tablet by mouth daily.  ? valACYclovir (VALTREX) 500 MG tablet Take 500 mg by mouth 2 (two) times daily as needed  (for fever bilsters outbreak).  ? BIOTIN PO Take 1 tablet by mouth daily. (Patient not taking: Reported on 09/10/2021)  ? Cholecalciferol (D3 ADULT PO) Take 1 tablet by mouth daily. (Patient not taking: Reported on 09/10/2021)  ? Multiple Vitamin (MULTIVITAMIN) tablet Take 1 tablet by mouth daily.  ? [DISCONTINUED] benzonatate (TESSALON) 100 MG capsule Take 1 capsule (100 mg total) by mouth 3 (three) times daily as needed for cough.  ? [DISCONTINUED] prochlorperazine (COMPAZINE) 10 MG tablet Take 0.5 tablets (5 mg total) by mouth every 6 (six) hours as needed (Nausea or vomiting).  ? ?No facility-administered encounter medications on file as of 09/10/2021.  ? ? ? ?ONCOLOGIC FAMILY HISTORY:  ?Family History  ?Problem Relation Age of Onset  ? Breast cancer Mother 42  ?     breast  ? Hypertension Father   ? Heart attack Father   ? Alzheimer's disease Father   ? Fibromyalgia Sister   ? Diabetes Sister   ? Heart attack Sister   ? Hypertension Sister   ? Hypertension Brother   ? Diabetes Brother   ? Pancreatic cancer Maternal Grandmother 74  ?     Pancreatic  ? Melanoma Paternal Uncle   ?     dx 56s  ? Melanoma Paternal Uncle   ?     dx 81s  ? Ovarian cancer Paternal Aunt   ?  dx 50s/60s  ? ? ? ?GENETIC COUNSELING/TESTING: ?See above  ? ?SOCIAL HISTORY:  ?Social History  ? ?Socioeconomic History  ? Marital status: Married  ?  Spouse name: Not on file  ? Number of children: Not on file  ? Years of education: Not on file  ? Highest education level: Not on file  ?Occupational History  ? Not on file  ?Tobacco Use  ? Smoking status: Never  ? Smokeless tobacco: Never  ?Vaping Use  ? Vaping Use: Never used  ?Substance and Sexual Activity  ? Alcohol use: No  ? Drug use: No  ? Sexual activity: Not Currently  ?  Birth control/protection: Post-menopausal  ?Other Topics Concern  ? Not on file  ?Social History Narrative  ? Married 58 years,lives with husband.[employer].  ? ?Social Determinants of Health   ? ?Financial Resource Strain: Not on file  ?Food Insecurity: Not on file  ?Transportation Needs: Not on file  ?Physical Activity: Not on file  ?Stress: Not on file  ?Social Connections: Not on file  ?[employer], broadcasting/film/video

## 2021-09-12 ENCOUNTER — Other Ambulatory Visit: Payer: Self-pay

## 2021-09-12 ENCOUNTER — Ambulatory Visit: Payer: No Typology Code available for payment source

## 2021-09-12 DIAGNOSIS — C50411 Malignant neoplasm of upper-outer quadrant of right female breast: Secondary | ICD-10-CM

## 2021-09-12 DIAGNOSIS — M25611 Stiffness of right shoulder, not elsewhere classified: Secondary | ICD-10-CM

## 2021-09-12 DIAGNOSIS — Z483 Aftercare following surgery for neoplasm: Secondary | ICD-10-CM | POA: Diagnosis not present

## 2021-09-12 DIAGNOSIS — R293 Abnormal posture: Secondary | ICD-10-CM

## 2021-09-12 DIAGNOSIS — M25612 Stiffness of left shoulder, not elsewhere classified: Secondary | ICD-10-CM

## 2021-09-12 NOTE — Therapy (Signed)
?OUTPATIENT PHYSICAL THERAPY TREATMENT NOTE ? ? ?Patient Name: Melissa Mcgrath ?MRN: [id number] ?DOB:09/23/1960, 61 y.o., female ?Today's Date: 09/12/2021 ? ?PCP: Pablo Lawrence, NP ?REFERRING PROVIDER: Stark Klein, MD ? ? PT End of Session - 09/12/21 1402   ? ? Visit Number 15   ? Number of Visits 19   ? Date for PT Re-Evaluation 09/18/21   ? PT Start Time 1402   ? PT Stop Time 1455   ? PT Time Calculation (min) 53 min   ? Activity Tolerance Patient tolerated treatment well   ? Behavior During Therapy [facility] for tasks assessed/performed   ? ?  ?  ? ?  ? ? ? ?Past Medical History:  ?Diagnosis Date  ? Anemia   ? Depression 01/05/2014  ? Facial basal cell cancer   ? Family history of breast cancer   ? Family history of melanoma   ? Family history of ovarian cancer   ? Family history of pancreatic cancer   ? History of radiation therapy   ? right chest wall and subclavian 06/11/2021-07/26/2021  Dr Gery Pray  ? Menopause 01/05/2014  ? Vaginal atrophy 11/09/2014  ? ?Past Surgical History:  ?Procedure Laterality Date  ? BILATERAL TOTAL MASTECTOMY WITH AXILLARY LYMPH NODE DISSECTION Right 04/25/2021  ? Procedure: RIGHT TOTAL MASTECTOMY WITH RIGHT AXILLARY LYMPH NODE DISSECTION;  Surgeon: Stark Klein, MD;  Location: [place];  Service: General;  Laterality: Right;  ? BREAST ENHANCEMENT SURGERY    ? BREAST IMPLANT REMOVAL Bilateral 04/25/2021  ? Procedure: REMOVAL BREAST IMPLANTS;  Surgeon: Stark Klein, MD;  Location: [place];  Service: General;  Laterality: Bilateral;  ? CARPAL TUNNEL RELEASE Right   ? PORTACATH PLACEMENT N/A 09/17/2020  ? Procedure: INSERTION PORT-A-CATH;  Surgeon: Stark Klein, MD;  Location: [place];  Service: General;  Laterality: N/A;  ? refractive lensectomy Bilateral   ? TOTAL MASTECTOMY Left 04/25/2021  ? Procedure: LEFT TOTAL MASTECTOMY;  Surgeon: Stark Klein, MD;  Location: [place];  Service: General;  Laterality: Left;  ? ?Patient Active Problem List  ? Diagnosis Date Noted  ?  Port-A-Cath in place 10/04/2020  ? Genetic testing 09/18/2020  ? History of augmentation mammoplasty 08/31/2020  ? Family history of breast cancer   ? Family history of pancreatic cancer   ? Family history of melanoma   ? Family history of ovarian cancer   ? Malignant neoplasm of upper-outer quadrant of right breast in female, estrogen receptor positive ([place]) 08/28/2020  ? Vaginal atrophy 11/09/2014  ? Menopause 01/05/2014  ? ? ?REFERRING DIAG: Rt breast cancer ? ?THERAPY DIAG:  ?Aftercare following surgery for neoplasm ? ?Stiffness of right shoulder, not elsewhere classified ? ?Malignant neoplasm of upper-outer quadrant of right breast in female, estrogen receptor positive ([place]) ? ?Stiffness of left shoulder, not elsewhere classified ? ?Abnormal posture ? 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 It measures 2.1 cm and is located in the upper outer quadrant. It is ER/PR positive and HER2 negative with a Ki67 of 40%. She underwent a bilateral mastectomy with a right axillary node dissection (24 negative axillary nodes and 2 negative intramammary nodes) on 04/25/2021. ? ?PRECAUTIONS: Rt UE lymphedema risk ? ?SUBJECTIVE:  ? ?I am tired today. 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 My right UT are really sore. I am sore in the chest and my right posterior shoulder today. I think its the stress. ? ?PAIN:  ? ?Are you having  pain? Yes ?Location:right chest, UT, medial arm ?Type; sore ? ? ?TODAY'S TREATMENT:  ?09/12/2021 ? ?MT: STM to right upper quarter with cocoa butter.in supine and SL.  MFR techniques to right UE cording and right chest ?PROM to Right shoulder flexion, scaption, abduction,ER ?TE on towel roll: alternating flexion x 10,  B. scaption x 10, horizontal abduction x 10, snow angels x 2 ? ? ?09/10/2021 ?MT: STM to right upper quarter with cocoa butter.  MFR techniques to right UE cording and right chest ?PROM to Right shoulder flexion,  scaption, abduction,ER ?TE on pink foam roll: alternating flexion x 10, scaption x 10, horizontal abduction x 10, snow angels x 10 ? ?09/05/2021 ?MT: STM and MFR to the Rt upper quadrant with cording release in various positions.  Work into flexion, abduction, ER,  ?TE: supine on large foam roll series: pectoralis stretch x 60", ER/pectoralis stretch x 60" , alternating flexion x 10 bil, pro/ret x 10 bil, snow angel abduction x 5 ? ? ?HOME EXERCISE PROGRAM: ?Walking and strength ABC ? ? ? Clinical Impression Statement Pt had increased soreness yesterday and today after working on taxes for 10 hours yesterday.  Very tender throughout traps, Levator,scapular region and pectorals. Twitch responses noted in right scapular region.Cording still present in axillary region and upper arm.  She did well with exercises on towel roll but remained very sore so held snow angels ? ? ? ?.  ?  Stability/Clinical Decision Making Stable/Uncomplicated   ?  PT Frequency 2x / week   ?  PT Duration 6 weeks   ?  PT Treatment/Interventions ADLs/Self Care Home Management;Manual lymph drainage;Manual techniques;Patient/family education;Passive range of motion;Therapeutic exercise;Therapeutic activities;Scar mobilization   ?  PT Next Visit Plan PROM bil shoulder, STM as needed, stretches for chest opening, general strength.    Cont every 3 month L-Dex screens for up to 2 years from her ALND (~04/26/2023)   ?  PT Home Exercise Plan ABC handout   ?  Consulted and Agree with Plan of Care Patient   ?  ? ? ? PT Long Term Goals - 08/07/21 1723   ? ?  ? PT LONG TERM GOAL #1  ? Title Pt will maintain bil shoulder PROM moving into time of most tightness post radiation therapy   ? Time 6   ? Period Weeks   ? Status New   ?  ? PT LONG TERM GOAL #2  ? Title Pt will be ind with final HEP to continue to build strength   ? Time 6   ? Period Weeks   ? Status New   ?  ? PT LONG TERM GOAL #3  ? Title NA   ?  ? PT LONG TERM GOAL #4  ? Title NA   ?  ? PT LONG TERM  GOAL #5  ? Title NA   ? ?  ?  ? ?  ? ? ? ? ? ? ?Claris Pong, PT ?09/12/2021, 3:10 PM ? ?   ?

## 2021-09-17 ENCOUNTER — Encounter: Payer: Self-pay | Admitting: Rehabilitation

## 2021-09-17 ENCOUNTER — Other Ambulatory Visit: Payer: Self-pay

## 2021-09-17 ENCOUNTER — Ambulatory Visit: Payer: No Typology Code available for payment source | Admitting: Rehabilitation

## 2021-09-17 DIAGNOSIS — M25612 Stiffness of left shoulder, not elsewhere classified: Secondary | ICD-10-CM

## 2021-09-17 DIAGNOSIS — M25611 Stiffness of right shoulder, not elsewhere classified: Secondary | ICD-10-CM

## 2021-09-17 DIAGNOSIS — Z483 Aftercare following surgery for neoplasm: Secondary | ICD-10-CM

## 2021-09-17 DIAGNOSIS — C50411 Malignant neoplasm of upper-outer quadrant of right female breast: Secondary | ICD-10-CM

## 2021-09-17 NOTE — Therapy (Signed)
?OUTPATIENT PHYSICAL THERAPY TREATMENT NOTE ? ? ?Patient Name: Melissa Mcgrath ?MRN: [id number] ?DOB:16-Sep-1960, 61 y.o., female ?Today's Date: 09/17/2021 ? ?PCP: Pablo Lawrence, NP ?REFERRING PROVIDER: Stark Klein, MD ? ? PT End of Session - 09/17/21 1100   ? ? Visit Number 16   ? Number of Visits 19   ? Date for PT Re-Evaluation 09/18/21   ? PT Start Time 1102   ? PT Stop Time 6948   ? PT Time Calculation (min) 47 min   ? Activity Tolerance Patient tolerated treatment well   ? Behavior During Therapy [facility] for tasks assessed/performed   ? ?  ?  ? ?  ? ? ? ?Past Medical History:  ?Diagnosis Date  ? Anemia   ? Depression 01/05/2014  ? Facial basal cell cancer   ? Family history of breast cancer   ? Family history of melanoma   ? Family history of ovarian cancer   ? Family history of pancreatic cancer   ? History of radiation therapy   ? right chest wall and subclavian 06/11/2021-07/26/2021  Dr Gery Pray  ? Menopause 01/05/2014  ? Vaginal atrophy 11/09/2014  ? ?Past Surgical History:  ?Procedure Laterality Date  ? BILATERAL TOTAL MASTECTOMY WITH AXILLARY LYMPH NODE DISSECTION Right 04/25/2021  ? Procedure: RIGHT TOTAL MASTECTOMY WITH RIGHT AXILLARY LYMPH NODE DISSECTION;  Surgeon: Stark Klein, MD;  Location: [place];  Service: General;  Laterality: Right;  ? BREAST ENHANCEMENT SURGERY    ? BREAST IMPLANT REMOVAL Bilateral 04/25/2021  ? Procedure: REMOVAL BREAST IMPLANTS;  Surgeon: Stark Klein, MD;  Location: [place];  Service: General;  Laterality: Bilateral;  ? CARPAL TUNNEL RELEASE Right   ? PORTACATH PLACEMENT N/A 09/17/2020  ? Procedure: INSERTION PORT-A-CATH;  Surgeon: Stark Klein, MD;  Location: [place];  Service: General;  Laterality: N/A;  ? refractive lensectomy Bilateral   ? TOTAL MASTECTOMY Left 04/25/2021  ? Procedure: LEFT TOTAL MASTECTOMY;  Surgeon: Stark Klein, MD;  Location: [place];  Service: General;  Laterality: Left;  ? ?Patient Active Problem List  ? Diagnosis Date Noted  ?  Port-A-Cath in place 10/04/2020  ? Genetic testing 09/18/2020  ? History of augmentation mammoplasty 08/31/2020  ? Family history of breast cancer   ? Family history of pancreatic cancer   ? Family history of melanoma   ? Family history of ovarian cancer   ? Malignant neoplasm of upper-outer quadrant of right breast in female, estrogen receptor positive ([place]) 08/28/2020  ? Vaginal atrophy 11/09/2014  ? Menopause 01/05/2014  ? ? ?REFERRING DIAG: Rt breast cancer ? ?THERAPY DIAG:  ?Aftercare following surgery for neoplasm ? ?Stiffness of right shoulder, not elsewhere classified ? ?Malignant neoplasm of upper-outer quadrant of right breast in female, estrogen receptor positive ([place]) ? ?Stiffness of left shoulder, not elsewhere classified ? 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 It measures 2.1 cm and is located in the upper outer quadrant. It is ER/PR positive and HER2 negative with a Ki67 of 40%. She underwent a bilateral mastectomy with a right axillary node dissection (24 negative axillary nodes and 2 negative intramammary nodes) on 04/25/2021. ? ?PRECAUTIONS: Rt UE lymphedema risk ? ?SUBJECTIVE:  Doing good.  I fell this weekend at the horse show I hurt my Rt knee.  I think just bruised.   ? ?PAIN:  ?Are you having pain? Yes ?Location:right chest, UT, medial arm ?Type; sore ? ? ?TODAY'S TREATMENT:  ?09/17/21 ?MT:  STM to right upper quarter with cocoa butter.  MFR techniques to right UE cording and right chest ?PROM to Right shoulder flexion, scaption, abduction,ER ?Yellow ball bil roll up the wall 5" x 10 ?on pink foam roll: alternating flexion x 10, pro/ret with purple ball x 10, pectoralis stretch x 30", Y AROM x 10 bil, bil ER x 10, snow angels x 5 ? ?09/12/2021 ?MT: STM to right upper quarter with cocoa butter.in supine and SL.  MFR techniques to right UE cording and right chest ?PROM to Right shoulder flexion, scaption, abduction,ER ?TE  on towel roll: alternating flexion x 10,  B. scaption x 10, horizontal abduction x 10, snow angels x 2 ? ? ?09/10/2021 ?MT: STM to right upper quarter with cocoa butter.  MFR techniques to right UE cording and right chest ?PROM to Right shoulder flexion, scaption, abduction,ER ?TE on pink foam roll: alternating flexion x 10, scaption x 10, horizontal abduction x 10, snow angels x 10 ? ?HOME EXERCISE PROGRAM: ?Walking and strength ABC ? ? ? Clinical Impression Statement Not as sore today after doing less desk work in the past few days.  Tolerated all well.  Cording was more noticeable today in overhead Y position and a bit tender here.   ? ?.  ?  Stability/Clinical Decision Making Stable/Uncomplicated   ?  PT Frequency 2x / week   ?  PT Duration 6 weeks   ?  PT Treatment/Interventions ADLs/Self Care Home Management;Manual lymph drainage;Manual techniques;Patient/family education;Passive range of motion;Therapeutic exercise;Therapeutic activities;Scar mobilization   ?  PT Next Visit Plan PROM bil shoulder, STM as needed, stretches for chest opening, general strength.    Cont every 3 month L-Dex screens for up to 2 years from her ALND (~04/26/2023)   ?  PT Home Exercise Plan ABC handout   ?  Consulted and Agree with Plan of Care Patient   ?  ? ? ? PT Long Term Goals - 08/07/21 1723   ? ?  ? PT LONG TERM GOAL #1  ? Title Pt will maintain bil shoulder PROM moving into time of most tightness post radiation therapy   ? Time 6   ? Period Weeks   ? Status New   ?  ? PT LONG TERM GOAL #2  ? Title Pt will be ind with final HEP to continue to build strength   ? Time 6   ? Period Weeks   ? Status New   ?  ? PT LONG TERM GOAL #3  ? Title NA   ?  ? PT LONG TERM GOAL #4  ? Title NA   ?  ? PT LONG TERM GOAL #5  ? Title NA   ? ?  ?  ? ?  ? ? ? ? ? ? ?Stark Bray, PT ?09/17/2021, 11:02 PM ? ?   ?

## 2021-09-19 ENCOUNTER — Encounter: Payer: No Typology Code available for payment source | Admitting: Rehabilitation

## 2021-09-24 ENCOUNTER — Encounter: Payer: No Typology Code available for payment source | Admitting: Rehabilitation

## 2021-09-25 LAB — COLOGUARD: COLOGUARD: NEGATIVE

## 2021-09-26 ENCOUNTER — Telehealth: Payer: Self-pay

## 2021-09-26 ENCOUNTER — Encounter: Payer: No Typology Code available for payment source | Admitting: Rehabilitation

## 2021-09-26 NOTE — Telephone Encounter (Signed)
-----   Message from Gardenia Phlegm, NP sent at 09/26/2021 12:00 PM EDT ----- ?Please let patient know that Cologuard is negative. ?----- Message ----- ?From: Interface, [place] In Three Two Nine ?Sent: 09/25/2021   5:17 PM EDT ?To: Gardenia Phlegm, NP ? ? ?

## 2021-09-26 NOTE — Telephone Encounter (Signed)
Called and spoke to pt, informed her of negative results from cologuard.  Pt verbalized understanding and thanks  ?

## 2021-09-30 ENCOUNTER — Ambulatory Visit (HOSPITAL_BASED_OUTPATIENT_CLINIC_OR_DEPARTMENT_OTHER)
Admission: RE | Admit: 2021-09-30 | Discharge: 2021-09-30 | Disposition: A | Discharge status: Home / Self Care | Payer: No Typology Code available for payment source | Source: Ambulatory Visit | Attending: Adult Health | Admitting: Adult Health

## 2021-09-30 DIAGNOSIS — E2839 Other primary ovarian failure: Secondary | ICD-10-CM | POA: Diagnosis present

## 2021-10-07 ENCOUNTER — Telehealth: Payer: Self-pay

## 2021-10-07 NOTE — Telephone Encounter (Signed)
-----   Message from Gardenia Phlegm, NP sent at 10/06/2021  9:35 PM EDT ----- ?Bone density is normal, please notify patient. ?----- Message ----- ?From: Interface, Rad Results In ?Sent: 09/30/2021   4:42 PM EDT ?To: Gardenia Phlegm, NP ? ? ?

## 2021-10-07 NOTE — Telephone Encounter (Signed)
Called and spoke with pt, pt aware per mychart that bone density is normal.  ?

## 2021-10-08 NOTE — Pre-Procedure Instructions (Signed)
Surgical Instructions ? ? ? Your procedure is scheduled on Tuesday 10/15/21. ? ? Report to [facility] Main Entrance "A" at 07:00 A.M., then check in with the Admitting office. ? Call this number if you have problems the morning of surgery: ? [phone] ? ? If you have any questions prior to your surgery date call [phone]: Open Monday-Friday 8am-4pm ? ? ? Remember: ? Do not eat after midnight the night before your surgery ? ?You may drink clear liquids until 06:00 A.M. the morning of your surgery.   ?Clear liquids allowed are: Water, Non-Citrus Juices (without pulp), Carbonated Beverages, Clear Tea, Black Coffee ONLY (NO MILK, CREAM OR POWDERED CREAMER of any kind), and Gatorade ?  ? Take these medicines the morning of surgery with A SIP OF WATER:  ? acetaminophen (TYLENOL)- If needed ? valACYclovir (VALTREX)- If needed ? ?Please follow your surgeon's instructions regarding letrozole [facility]). If you have not received instructions then please contact your surgeon's office for instructions. ? ?As of today, STOP taking any Aspirin (unless otherwise instructed by your surgeon) Aleve, Naproxen, Ibuprofen, Motrin, Advil, Goody's, BC's, all herbal medications, fish oil, and all vitamins. ? ?         ?Do not wear jewelry or makeup ?Do not wear lotions, powders, perfumes/colognes, or deodorant. ?Do not shave 48 hours prior to surgery.  Men may shave face and neck. ?Do not bring valuables to the hospital. ?Do not wear nail polish, gel polish, artificial nails, or any other type of covering on natural nails (fingers and toes) ?If you have artificial nails or gel coating that need to be removed by a nail salon, please have this removed prior to surgery. Artificial nails or gel coating may interfere with anesthesia's ability to adequately monitor your vital signs. ? ?[place] is not responsible for any belongings or valuables. .  ? ?Do NOT Smoke (Tobacco/Vaping)  24 hours prior to your procedure ? ?If you use a CPAP at  night, you may bring your mask for your overnight stay. ?  ?Contacts, glasses, hearing aids, dentures or partials may not be worn into surgery, please bring cases for these belongings ?  ?For patients admitted to the hospital, discharge time will be determined by your treatment team. ?  ?Patients discharged the day of surgery will not be allowed to drive home, and someone needs to stay with them for 24 hours. ? ? ?SURGICAL WAITING ROOM VISITATION ?Patients having surgery or a procedure in a hospital may have two support people. ?Children under the age of 31 must have an adult with them who is not the patient. ?They may stay in the waiting area during the procedure and may switch out with other visitors. If the patient needs to stay at the hospital during part of their recovery, the visitor guidelines for inpatient rooms apply. ? ?Please refer to the [place] website for the visitor guidelines for Inpatients (after your surgery is over and you are in a regular room).  ? ? ? ? ? ?Special instructions:   ? ?Oral Hygiene is also important to reduce your risk of infection.  Remember - BRUSH YOUR TEETH THE MORNING OF SURGERY WITH YOUR REGULAR TOOTHPASTE ? ? ?[place]- Preparing For Surgery ? ?Before surgery, you can play an important role. Because skin is not sterile, your skin needs to be as free of germs as possible. You can reduce the number of germs on your skin by washing with CHG (chlorahexidine gluconate) Soap before surgery.  CHG  is an antiseptic cleaner which kills germs and bonds with the skin to continue killing germs even after washing.   ? ? ?Please do not use if you have an allergy to CHG or antibacterial soaps. If your skin becomes reddened/irritated stop using the CHG.  ?Do not shave (including legs and underarms) for at least 48 hours prior to first CHG shower. It is OK to shave your face. ? ?Please follow these instructions carefully. ?  ? ? Shower the NIGHT BEFORE SURGERY and the MORNING OF SURGERY  with CHG Soap.  ? If you chose to wash your hair, wash your hair first as usual with your normal shampoo. After you shampoo, rinse your hair and body thoroughly to remove the shampoo.  Then [employer] and genitals (private parts) with your normal soap and rinse thoroughly to remove soap. ? ?After that Use CHG Soap as you would any other liquid soap. You can apply CHG directly to the skin and wash gently with a scrungie or a clean washcloth.  ? ?Apply the CHG Soap to your body ONLY FROM THE NECK DOWN.  Do not use on open wounds or open sores. Avoid contact with your eyes, ears, mouth and genitals (private parts). Wash Face and genitals (private parts)  with your normal soap.  ? ?Wash thoroughly, paying special attention to the area where your surgery will be performed. ? ?Thoroughly rinse your body with warm water from the neck down. ? ?DO NOT shower/wash with your normal soap after using and rinsing off the CHG Soap. ? ?Pat yourself dry with a CLEAN TOWEL. ? ?Wear CLEAN PAJAMAS to bed the night before surgery ? ?Place CLEAN SHEETS on your bed the night before your surgery ? ?DO NOT SLEEP WITH PETS. ? ? ?Day of Surgery: ? ?Take a shower with CHG soap. ?Wear Clean/Comfortable clothing the morning of surgery ?Do not apply any deodorants/lotions.   ?Remember to brush your teeth WITH YOUR REGULAR TOOTHPASTE. ? ? ? ?If you received a COVID test during your pre-op visit  it is requested that you wear a mask when out in public, stay away from anyone that may not be feeling well and notify your surgeon if you develop symptoms. If you have been in contact with anyone that has tested positive in the last 10 days please notify you surgeon. ? ?  ?Please read over the following fact sheets that you were given.  ? ?

## 2021-10-10 ENCOUNTER — Encounter (HOSPITAL_COMMUNITY): Payer: Self-pay

## 2021-10-10 ENCOUNTER — Other Ambulatory Visit: Payer: Self-pay

## 2021-10-10 ENCOUNTER — Encounter (HOSPITAL_COMMUNITY)
Admission: RE | Admit: 2021-10-10 | Discharge: 2021-10-10 | Disposition: A | Discharge status: Home / Self Care | Payer: No Typology Code available for payment source | Source: Ambulatory Visit | Attending: General Surgery | Admitting: General Surgery

## 2021-10-10 VITALS — BP 106/54 | HR 66 | Temp 98.2°F | Resp 18 | Ht 68.0 in | Wt 140.8 lb

## 2021-10-10 DIAGNOSIS — Z01812 Encounter for preprocedural laboratory examination: Secondary | ICD-10-CM | POA: Diagnosis present

## 2021-10-10 DIAGNOSIS — Z01818 Encounter for other preprocedural examination: Secondary | ICD-10-CM

## 2021-10-10 LAB — CBC
HCT: 38.4 % (ref 36.0–46.0)
Hemoglobin: 12.6 g/dL (ref 12.0–15.0)
MCH: 32.3 pg (ref 26.0–34.0)
MCHC: 32.8 g/dL (ref 30.0–36.0)
MCV: 98.5 fL (ref 80.0–100.0)
Platelets: 226 K/uL (ref 150–400)
RBC: 3.9 MIL/uL (ref 3.87–5.11)
RDW: 12.4 % (ref 11.5–15.5)
WBC: 4.8 K/uL (ref 4.0–10.5)
nRBC: 0 % (ref 0.0–0.2)

## 2021-10-10 LAB — BASIC METABOLIC PANEL
Anion gap: 7 (ref 5–15)
BUN: 12 mg/dL (ref 6–20)
CO2: 28 mmol/L (ref 22–32)
Calcium: 9.5 mg/dL (ref 8.9–10.3)
Chloride: 106 mmol/L (ref 98–111)
Creatinine, Ser: 0.83 mg/dL (ref 0.44–1.00)
GFR, Estimated: >60 mL/min (ref >60)
Glucose, Bld: 101 mg/dL — ABNORMAL HIGH (ref 70–99)
Potassium: 3.8 mmol/L (ref 3.5–5.1)
Sodium: 141 mmol/L (ref 135–145)

## 2021-10-10 NOTE — Pre-Procedure Instructions (Signed)
Surgical Instructions ? ? ? Your procedure is scheduled on Tuesday 10/15/21. ? ? Report to [facility] Main Entrance "A" at 07:00 A.M., then check in with the Admitting office. ? Call this number if you have problems the morning of surgery: ? [phone] ? ? If you have any questions prior to your surgery date call [phone]: Open Monday-Friday 8am-4pm ? ? ? Remember: ? Do not eat after midnight the night before your surgery ? ?You may drink clear liquids until 06:00 A.M. the morning of your surgery.   ?Clear liquids allowed are: Water, Non-Citrus Juices (without pulp), Carbonated Beverages, Clear Tea, Black Coffee ONLY (NO MILK, CREAM OR POWDERED CREAMER of any kind), and Gatorade ?  ? Take these medicines the morning of surgery with A SIP OF WATER:  ? acetaminophen (TYLENOL)- If needed ? valACYclovir (VALTREX)- If needed ? ?Please follow your surgeon's instructions regarding letrozole [facility]). If you have not received instructions then please contact your surgeon's office for instructions. ? ?As of today, STOP taking any Aspirin (unless otherwise instructed by your surgeon) Aleve, Naproxen, Ibuprofen, Motrin, Advil, Goody's, BC's, all herbal medications, fish oil, and all vitamins. ? ?         ?Do not wear jewelry or makeup ?Do not wear lotions, powders, perfumes or deodorant. ?Do not shave 48 hours prior to surgery.   ?Do not bring valuables to the hospital. ?Do not wear nail polish, gel polish, artificial nails, or any other type of covering on natural nails (fingers and toes) ?If you have artificial nails or gel coating that need to be removed by a nail salon, please have this removed prior to surgery. Artificial nails or gel coating may interfere with anesthesia's ability to adequately monitor your vital signs. ? ?[place] is not responsible for any belongings or valuables. .  ? ?Do NOT Smoke (Tobacco/Vaping)  24 hours prior to your procedure ? ?If you use a CPAP at night, you may bring your mask for  your overnight stay. ?  ?Contacts, glasses, hearing aids, dentures or partials may not be worn into surgery, please bring cases for these belongings ?  ?For patients admitted to the hospital, discharge time will be determined by your treatment team. ?  ?Patients discharged the day of surgery will not be allowed to drive home, and someone needs to stay with them for 24 hours. ? ? ?SURGICAL WAITING ROOM VISITATION ?Patients having surgery or a procedure in a hospital may have two support people. ?Children under the age of 26 must have an adult with them who is not the patient. ?They may stay in the waiting area during the procedure and may switch out with other visitors. If the patient needs to stay at the hospital during part of their recovery, the visitor guidelines for inpatient rooms apply. ? ?Please refer to the [place] website for the visitor guidelines for Inpatients (after your surgery is over and you are in a regular room).  ? ? ? ?Special instructions:   ? ?Oral Hygiene is also important to reduce your risk of infection.  Remember - BRUSH YOUR TEETH THE MORNING OF SURGERY WITH YOUR REGULAR TOOTHPASTE ? ? ?[place]- Preparing For Surgery ? ?Before surgery, you can play an important role. Because skin is not sterile, your skin needs to be as free of germs as possible. You can reduce the number of germs on your skin by washing with CHG (chlorahexidine gluconate) Soap before surgery.  CHG is an antiseptic cleaner which kills germs  and bonds with the skin to continue killing germs even after washing.   ? ? ?Please do not use if you have an allergy to CHG or antibacterial soaps. If your skin becomes reddened/irritated stop using the CHG.  ?Do not shave (including legs and underarms) for at least 48 hours prior to first CHG shower. It is OK to shave your face. ? ?Please follow these instructions carefully. ?  ? ? Shower the NIGHT BEFORE SURGERY and the MORNING OF SURGERY with CHG Soap.  ? If you chose to wash  your hair, wash your hair first as usual with your normal shampoo. After you shampoo, rinse your hair and body thoroughly to remove the shampoo.  Then [employer] and genitals (private parts) with your normal soap and rinse thoroughly to remove soap. ? ?After that Use CHG Soap as you would any other liquid soap. You can apply CHG directly to the skin and wash gently with a scrungie or a clean washcloth.  ? ?Apply the CHG Soap to your body ONLY FROM THE NECK DOWN.  Do not use on open wounds or open sores. Avoid contact with your eyes, ears, mouth and genitals (private parts). Wash Face and genitals (private parts)  with your normal soap.  ? ?Wash thoroughly, paying special attention to the area where your surgery will be performed. ? ?Thoroughly rinse your body with warm water from the neck down. ? ?DO NOT shower/wash with your normal soap after using and rinsing off the CHG Soap. ? ?Pat yourself dry with a CLEAN TOWEL. ? ?Wear CLEAN PAJAMAS to bed the night before surgery ? ?Place CLEAN SHEETS on your bed the night before your surgery ? ?DO NOT SLEEP WITH PETS. ? ? ?Day of Surgery: ? ?Take a shower with CHG soap. ?Wear Clean/Comfortable clothing the morning of surgery ?Do not apply any deodorants/lotions.   ?Remember to brush your teeth WITH YOUR REGULAR TOOTHPASTE. ? ? ? ?If you received a COVID test during your pre-op visit  it is requested that you wear a mask when out in public, stay away from anyone that may not be feeling well and notify your surgeon if you develop symptoms. If you have been in contact with anyone that has tested positive in the last 10 days please notify you surgeon. ? ?  ?Please read over the following fact sheets that you were given.  ? ?

## 2021-10-10 NOTE — Progress Notes (Signed)
PCP:  Pablo Lawrence, NP ?Cardiologist:  denies ? ?EKG: na ?CXR:  09/17/20 ?ECHO:  09/11/20 ?Stress Test: denies ?Cardiac Cath: denies ? ?Fasting Blood Sugar-  na ?Checks Blood Sugar__na_ times a day ? ?OSA/CPAP: No ? ?ASA/Blood Thinner: No ? ?Covid test not needed ? ?Anesthesia Review: No ? ?Patient denies shortness of breath, fever, cough, and chest pain at PAT appointment. ? ?Patient verbalized understanding of instructions provided today at the PAT appointment.  Patient asked to review instructions at home and day of surgery.   ?

## 2021-10-14 NOTE — Anesthesia Preprocedure Evaluation (Addendum)
Anesthesia Evaluation  ?Patient identified by MRN, date of birth, ID band ?Patient awake ? ? ? ?Reviewed: ?Allergy & Precautions, NPO status , Patient's Chart, lab work & pertinent test results ? ?Airway ?Mallampati: I ? ?TM Distance: >3 FB ?Neck ROM: Full ? ? ? Dental ?no notable dental hx. ?(+) Teeth Intact, Dental Advisory Given ?  ?Pulmonary ?neg pulmonary ROS,  ?  ?Pulmonary exam normal ?breath sounds clear to auscultation ? ? ? ? ? ? Cardiovascular ?negative cardio ROS ?Normal cardiovascular exam ?Rhythm:Regular Rate:Normal ? ? ?  ?Neuro/Psych ?PSYCHIATRIC DISORDERS Depression negative neurological ROS ?   ? GI/Hepatic ?negative GI ROS, Neg liver ROS,   ?Endo/Other  ?negative endocrine ROS ? Renal/GU ?negative Renal ROS  ?negative genitourinary ?  ?Musculoskeletal ?negative musculoskeletal ROS ?(+)  ? Abdominal ?  ?Peds ? Hematology ?negative hematology ROS ?(+)   ?Anesthesia Other Findings ?Right breast CA ? Reproductive/Obstetrics ? ?  ? ? ? ? ? ? ? ? ? ? ? ? ? ?  ?  ? ? ? ? ? ? ? ?Anesthesia Physical ?Anesthesia Plan ? ?ASA: 2 ? ?Anesthesia Plan: General  ? ?Post-op Pain Management: Tylenol PO (pre-op)* and Toradol IV (intra-op)*  ? ?Induction: Intravenous ? ?PONV Risk Score and Plan: 3 and Ondansetron, Dexamethasone and Midazolam ? ?Airway Management Planned: LMA ? ?Additional Equipment:  ? ?Intra-op Plan:  ? ?Post-operative Plan: Extubation in OR ? ?Informed Consent: I have reviewed the patients History and Physical, chart, labs and discussed the procedure including the risks, benefits and alternatives for the proposed anesthesia with the patient or authorized representative who has indicated his/her understanding and acceptance.  ? ? ? ?Dental advisory given ? ?Plan Discussed with: CRNA ? ?Anesthesia Plan Comments:   ? ? ? ? ? ? ?Anesthesia Quick Evaluation ? ?

## 2021-10-15 ENCOUNTER — Encounter (HOSPITAL_COMMUNITY)
Admission: RE | Disposition: A | Discharge status: Home / Self Care | Payer: Self-pay | Source: Home / Self Care | Attending: General Surgery

## 2021-10-15 ENCOUNTER — Ambulatory Visit (HOSPITAL_COMMUNITY)
Admission: RE | Admit: 2021-10-15 | Discharge: 2021-10-15 | Disposition: A | Discharge status: Home / Self Care | Payer: No Typology Code available for payment source | Attending: General Surgery | Admitting: General Surgery

## 2021-10-15 ENCOUNTER — Ambulatory Visit (HOSPITAL_BASED_OUTPATIENT_CLINIC_OR_DEPARTMENT_OTHER): Payer: No Typology Code available for payment source | Admitting: Anesthesiology

## 2021-10-15 ENCOUNTER — Other Ambulatory Visit: Payer: Self-pay

## 2021-10-15 ENCOUNTER — Ambulatory Visit (HOSPITAL_COMMUNITY): Payer: No Typology Code available for payment source | Admitting: Anesthesiology

## 2021-10-15 ENCOUNTER — Encounter (HOSPITAL_COMMUNITY): Payer: Self-pay | Admitting: General Surgery

## 2021-10-15 DIAGNOSIS — Z85828 Personal history of other malignant neoplasm of skin: Secondary | ICD-10-CM | POA: Insufficient documentation

## 2021-10-15 DIAGNOSIS — Z452 Encounter for adjustment and management of vascular access device: Secondary | ICD-10-CM

## 2021-10-15 DIAGNOSIS — L905 Scar conditions and fibrosis of skin: Secondary | ICD-10-CM | POA: Insufficient documentation

## 2021-10-15 DIAGNOSIS — L7682 Other postprocedural complications of skin and subcutaneous tissue: Secondary | ICD-10-CM

## 2021-10-15 DIAGNOSIS — Z923 Personal history of irradiation: Secondary | ICD-10-CM | POA: Diagnosis not present

## 2021-10-15 DIAGNOSIS — Z9013 Acquired absence of bilateral breasts and nipples: Secondary | ICD-10-CM | POA: Insufficient documentation

## 2021-10-15 DIAGNOSIS — Z853 Personal history of malignant neoplasm of breast: Secondary | ICD-10-CM | POA: Insufficient documentation

## 2021-10-15 HISTORY — PX: PORT-A-CATH REMOVAL: SHX5289

## 2021-10-15 HISTORY — PX: SCAR REVISION: SHX5285

## 2021-10-15 SURGERY — REMOVAL PORT-A-CATH
Anesthesia: General

## 2021-10-15 MED ORDER — ORAL CARE MOUTH RINSE: 15.0000 mL | Freq: Once | OROMUCOSAL | Status: AC

## 2021-10-15 MED ORDER — ONDANSETRON HCL 4 MG/2ML IJ SOLN
INTRAMUSCULAR | Status: DC | PRN
  Administered 2021-10-15: 4 mg via INTRAVENOUS

## 2021-10-15 MED ORDER — CHLORHEXIDINE GLUCONATE CLOTH 2 % EX PADS: 6.0000 | MEDICATED_PAD | Freq: Once | CUTANEOUS | Status: DC

## 2021-10-15 MED ORDER — PROPOFOL 10 MG/ML IV BOLUS
INTRAVENOUS | Status: AC
  Filled 2021-10-15: qty 20

## 2021-10-15 MED ORDER — OXYCODONE HCL 5 MG PO TABS: 5.0000 mg | ORAL_TABLET | Freq: Four times a day (QID) | ORAL | 0 refills | Status: DC | PRN

## 2021-10-15 MED ORDER — BUPIVACAINE-EPINEPHRINE (PF) 0.25% -1:200000 IJ SOLN
INTRAMUSCULAR | Status: AC
  Filled 2021-10-15: qty 30

## 2021-10-15 MED ORDER — AMISULPRIDE (ANTIEMETIC) 5 MG/2ML IV SOLN: 10.0000 mg | Freq: Once | INTRAVENOUS | Status: AC

## 2021-10-15 MED ORDER — BUPIVACAINE LIPOSOME 1.3 % IJ SUSP
INTRAMUSCULAR | Status: AC
  Filled 2021-10-15: qty 20

## 2021-10-15 MED ORDER — AMISULPRIDE (ANTIEMETIC) 5 MG/2ML IV SOLN
INTRAVENOUS | Status: AC
  Administered 2021-10-15: 10 mg via INTRAVENOUS
  Filled 2021-10-15: qty 4

## 2021-10-15 MED ORDER — CHLORHEXIDINE GLUCONATE CLOTH 2 % EX PADS
6.0000 | MEDICATED_PAD | Freq: Once | CUTANEOUS | Status: DC
Start: 2021-10-15 — End: 2021-10-15

## 2021-10-15 MED ORDER — ONDANSETRON HCL 4 MG/2ML IJ SOLN
INTRAMUSCULAR | Status: AC
  Filled 2021-10-15: qty 2

## 2021-10-15 MED ORDER — LIDOCAINE 2% (20 MG/ML) 5 ML SYRINGE
INTRAMUSCULAR | Status: AC
  Filled 2021-10-15: qty 5

## 2021-10-15 MED ORDER — CHLORHEXIDINE GLUCONATE 0.12 % MT SOLN: 15.0000 mL | Freq: Once | OROMUCOSAL | Status: AC

## 2021-10-15 MED ORDER — FENTANYL CITRATE (PF) 250 MCG/5ML IJ SOLN
INTRAMUSCULAR | Status: AC
  Filled 2021-10-15: qty 5

## 2021-10-15 MED ORDER — PROPOFOL 10 MG/ML IV BOLUS
INTRAVENOUS | Status: DC | PRN
  Administered 2021-10-15: 150 mg via INTRAVENOUS

## 2021-10-15 MED ORDER — FENTANYL CITRATE (PF) 250 MCG/5ML IJ SOLN
INTRAMUSCULAR | Status: DC | PRN
  Administered 2021-10-15: 25 ug via INTRAVENOUS
  Administered 2021-10-15 (×2): 50 ug via INTRAVENOUS
  Administered 2021-10-15: 25 ug via INTRAVENOUS

## 2021-10-15 MED ORDER — CHLORHEXIDINE GLUCONATE 0.12 % MT SOLN
OROMUCOSAL | Status: AC
  Administered 2021-10-15: 15 mL
  Filled 2021-10-15: qty 15

## 2021-10-15 MED ORDER — DEXAMETHASONE SODIUM PHOSPHATE 10 MG/ML IJ SOLN
INTRAMUSCULAR | Status: AC
  Filled 2021-10-15: qty 1

## 2021-10-15 MED ORDER — LIDOCAINE 2% (20 MG/ML) 5 ML SYRINGE
INTRAMUSCULAR | Status: DC | PRN
  Administered 2021-10-15: 50 mg via INTRAVENOUS

## 2021-10-15 MED ORDER — LACTATED RINGERS IV SOLN: INTRAVENOUS | Status: DC

## 2021-10-15 MED ORDER — DEXAMETHASONE SODIUM PHOSPHATE 10 MG/ML IJ SOLN
INTRAMUSCULAR | Status: DC | PRN
  Administered 2021-10-15: 4 mg via INTRAVENOUS

## 2021-10-15 MED ORDER — PROPOFOL 1000 MG/100ML IV EMUL
INTRAVENOUS | Status: AC
  Filled 2021-10-15: qty 100

## 2021-10-15 MED ORDER — PHENYLEPHRINE 80 MCG/ML (10ML) SYRINGE FOR IV PUSH (FOR BLOOD PRESSURE SUPPORT)
PREFILLED_SYRINGE | INTRAVENOUS | Status: AC
  Filled 2021-10-15: qty 10

## 2021-10-15 MED ORDER — FENTANYL CITRATE (PF) 100 MCG/2ML IJ SOLN: 25.0000 ug | INTRAMUSCULAR | Status: DC | PRN

## 2021-10-15 MED ORDER — ACETAMINOPHEN 500 MG PO TABS
1000.0000 mg | ORAL_TABLET | Freq: Once | ORAL | Status: AC
  Administered 2021-10-15: 1000 mg via ORAL
  Filled 2021-10-15: qty 2

## 2021-10-15 MED ORDER — CEFAZOLIN SODIUM-DEXTROSE 2-4 GM/100ML-% IV SOLN
2.0000 g | INTRAVENOUS | Status: AC
  Administered 2021-10-15: 2 g via INTRAVENOUS
  Filled 2021-10-15: qty 100

## 2021-10-15 MED ORDER — PHENYLEPHRINE 80 MCG/ML (10ML) SYRINGE FOR IV PUSH (FOR BLOOD PRESSURE SUPPORT)
PREFILLED_SYRINGE | INTRAVENOUS | Status: DC | PRN
  Administered 2021-10-15: 160 ug via INTRAVENOUS
  Administered 2021-10-15: 80 ug via INTRAVENOUS
  Administered 2021-10-15 (×2): 120 ug via INTRAVENOUS
  Administered 2021-10-15: 40 ug via INTRAVENOUS
  Administered 2021-10-15 (×3): 120 ug via INTRAVENOUS

## 2021-10-15 MED ORDER — EPHEDRINE 5 MG/ML INJ
INTRAVENOUS | Status: AC
  Filled 2021-10-15: qty 5

## 2021-10-15 MED ORDER — BUPIVACAINE-EPINEPHRINE 0.25% -1:200000 IJ SOLN
INTRAMUSCULAR | Status: DC | PRN
  Administered 2021-10-15: 20 mL via INTRADERMAL

## 2021-10-15 MED ORDER — EPHEDRINE SULFATE-NACL 50-0.9 MG/10ML-% IV SOSY
PREFILLED_SYRINGE | INTRAVENOUS | Status: DC | PRN
  Administered 2021-10-15 (×3): 10 mg via INTRAVENOUS
  Administered 2021-10-15: 5 mg via INTRAVENOUS

## 2021-10-15 MED ORDER — BUPIVACAINE LIPOSOME 1.3 % IJ SUSP
INTRAMUSCULAR | Status: DC | PRN
  Administered 2021-10-15: 10 mL

## 2021-10-15 SURGICAL SUPPLY — 42 items
ADH SKN CLS APL DERMABOND .7 (GAUZE/BANDAGES/DRESSINGS) ×4
BAG COUNTER SPONGE SURGICOUNT (BAG) ×3 IMPLANT
BAG SPNG CNTER NS LX DISP (BAG) ×2
BINDER BREAST LRG (GAUZE/BANDAGES/DRESSINGS) ×1 IMPLANT
BIOPATCH RED 1 DISK 7.0 (GAUZE/BANDAGES/DRESSINGS) ×1 IMPLANT
CHLORAPREP W/TINT 10.5 ML (MISCELLANEOUS) ×3 IMPLANT
COVER SURGICAL LIGHT HANDLE (MISCELLANEOUS) ×3 IMPLANT
DERMABOND ADVANCED (GAUZE/BANDAGES/DRESSINGS) ×2
DERMABOND ADVANCED .7 DNX12 (GAUZE/BANDAGES/DRESSINGS) ×2 IMPLANT
DRAIN CHANNEL 15F RND FF W/TCR (WOUND CARE) ×1 IMPLANT
DRAPE CHEST BREAST 15X10 FENES (DRAPES) ×1 IMPLANT
DRAPE LAPAROTOMY 100X72 PEDS (DRAPES) ×3 IMPLANT
DRSG PAD ABDOMINAL 8X10 ST (GAUZE/BANDAGES/DRESSINGS) ×1 IMPLANT
DRSG TEGADERM 4X4.75 (GAUZE/BANDAGES/DRESSINGS) ×1 IMPLANT
ELECT CAUTERY BLADE 6.4 (BLADE) ×3 IMPLANT
ELECT REM PT RETURN 9FT ADLT (ELECTROSURGICAL) ×3
ELECTRODE REM PT RTRN 9FT ADLT (ELECTROSURGICAL) ×2 IMPLANT
EVACUATOR SILICONE 100CC (DRAIN) ×1 IMPLANT
GAUZE 4X4 16PLY ~~LOC~~+RFID DBL (SPONGE) ×3 IMPLANT
GLOVE BIO SURGEON STRL SZ 6 (GLOVE) ×3 IMPLANT
GLOVE INDICATOR 6.5 STRL GRN (GLOVE) ×3 IMPLANT
GOWN STRL REUS W/ TWL LRG LVL3 (GOWN DISPOSABLE) ×2 IMPLANT
GOWN STRL REUS W/TWL 2XL LVL3 (GOWN DISPOSABLE) ×3 IMPLANT
GOWN STRL REUS W/TWL LRG LVL3 (GOWN DISPOSABLE) ×3
KIT BASIN OR (CUSTOM PROCEDURE TRAY) ×3 IMPLANT
KIT TURNOVER KIT B (KITS) ×3 IMPLANT
NDL HYPO 25GX1X1/2 BEV (NEEDLE) ×2 IMPLANT
NEEDLE HYPO 25GX1X1/2 BEV (NEEDLE) ×3 IMPLANT
NS IRRIG 1000ML POUR BTL (IV SOLUTION) ×3 IMPLANT
PACK GENERAL/GYN (CUSTOM PROCEDURE TRAY) ×3 IMPLANT
PAD ARMBOARD 7.5X6 YLW CONV (MISCELLANEOUS) ×6 IMPLANT
SPONGE T-LAP 18X18 ~~LOC~~+RFID (SPONGE) ×1 IMPLANT
STRIP CLOSURE SKIN 1/2X4 (GAUZE/BANDAGES/DRESSINGS) ×1 IMPLANT
SUT ETHILON 3 0 PS 1 (SUTURE) ×1 IMPLANT
SUT MNCRL AB 4-0 PS2 18 (SUTURE) ×1 IMPLANT
SUT MON AB 4-0 PC3 18 (SUTURE) ×3 IMPLANT
SUT VIC AB 3-0 SH 27 (SUTURE) ×3
SUT VIC AB 3-0 SH 27X BRD (SUTURE) ×2 IMPLANT
SUT VIC AB 3-0 SH 8-18 (SUTURE) ×1 IMPLANT
SYR CONTROL 10ML LL (SYRINGE) ×3 IMPLANT
TOWEL GREEN STERILE (TOWEL DISPOSABLE) ×3 IMPLANT
TOWEL GREEN STERILE FF (TOWEL DISPOSABLE) ×3 IMPLANT

## 2021-10-15 NOTE — Anesthesia Postprocedure Evaluation (Signed)
Anesthesia Post Note ? ?Patient: Melissa Mcgrath ? ?Procedure(s) Performed: REMOVAL PORT-A-CATH ?REVISION OF LEFT MASTECTOMY SCAR (Left) ? ?  ? ?Patient location during evaluation: PACU ?Anesthesia Type: General ?Level of consciousness: awake and alert ?Pain management: pain level controlled ?Vital Signs Assessment: post-procedure vital signs reviewed and stable ?Respiratory status: spontaneous breathing, nonlabored ventilation, respiratory function stable and patient connected to nasal cannula oxygen ?Cardiovascular status: blood pressure returned to baseline and stable ?Postop Assessment: no apparent nausea or vomiting ?Anesthetic complications: no ? ? ?No notable events documented. ? ?Last Vitals:  ?Vitals:  ? 10/15/21 1135 10/15/21 1150  ?BP: 126/67 128/61  ?Pulse: 89 90  ?Resp: 13 12  ?Temp:  36.4 ?C  ?SpO2: 93% 94%  ?  ?Last Pain:  ?Vitals:  ? 10/15/21 1150  ?TempSrc:   ?PainSc: 0-No pain  ? ? ?  ?  ?  ?  ?  ?  ? ?Jin Shockley L Ami Thornsberry ? ? ? ? ?

## 2021-10-15 NOTE — Interval H&P Note (Signed)
History and Physical Interval Note: ? ?10/15/2021 ?7:29 AM ? ?Melissa Mcgrath  has presented today for surgery, with the diagnosis of RIGHT BREAST CANCER.  The various methods of treatment have been discussed with the patient and family. After consideration of risks, benefits and other options for treatment, the patient has consented to  Procedure(s): ?REMOVAL PORT-A-CATH (N/A) ?REVISION OF LEFT MASTECTOMY SCAR (Left) as a surgical intervention.  The patient's history has been reviewed, patient examined, no change in status, stable for surgery.  I have reviewed the patient's chart and labs.  Questions were answered to the patient's satisfaction.   ? ? ?Stark Klein ? ? ?

## 2021-10-15 NOTE — Anesthesia Procedure Notes (Signed)
Procedure Name: LMA Insertion ?Date/Time: 10/15/2021 8:52 AM ?Performed by: Cathren Harsh, CRNA ?Pre-anesthesia Checklist: Patient identified, Emergency Drugs available, Suction available and Patient being monitored ?Patient Re-evaluated:Patient Re-evaluated prior to induction ?Oxygen Delivery Method: Circle System Utilized ?Preoxygenation: Pre-oxygenation with 100% oxygen ?Induction Type: IV induction ?Ventilation: Mask ventilation without difficulty ?LMA: LMA inserted ?LMA Size: 4.0 ?Number of attempts: 1 ?Airway Equipment and Method: Bite block ?Placement Confirmation: positive ETCO2 ?Tube secured with: Tape ?Dental Injury: Teeth and Oropharynx as per pre-operative assessment  ? ? ? ? ?

## 2021-10-15 NOTE — H&P (Signed)
?PROVIDER: Georgianne Fick, MD ? ?MRN: [id number] ?DOB: 21-Feb-1961 ?Subjective  ? ?Chief Complaint: Follow-up  ? ?History of Present Illness: ?Melissa Mcgrath is a 61 y.o. female who is seen today for follow up of breast cancer ? ?Pt presented with a diagnosis of right breast cancer 08/21/2020.  She had a palpable mass for several months and thought it was probably related to her implants (20 years go by [employer]low Mares, saline, retropectoral).  It seemed to be more prominent and she talked to Dr. Helane Rima who sent her to get diagnostic imaging.  She was seen to have a 2.1 cm mass at 9 o'clock on the right with a small satellite mass adjacent to it.  There were 3 abnormal appearing nodes as well.  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  The node was also positive.  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  She had a mother that had metastatic breast cancer at age 66 and a maternal grandmother wtih pancreatic cancer.  Maternal aunt also had breast cancer age 81.  She had menarche at age 46, and menopause age 86.  She used HRT for around a year.  She is a Advertising copywriter and used hormonal contraception for around 20 years.  She hasn't had a colonoscopy or bone density study, but has had annual mammography.   ?   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  She is quite active wtih riding and caring for the animals, but has employee that does almost all the cleaning and heavy lifting.  ? ?She is s/p bilateral mastectomies and right ALND 04/25/21. Breast implants were also removed. She had 1.6 cm left and 0/24 nodes. The margin was read as focally positive, but some of the implant capsule came out as well. She already had XRT planned for the nodal positivity.  ? ?Interval history: ?Pt is s/p XRT. She is going to get  letrozole and possible adjuvant Abemaciclib. She is having discomfort on the left side due to excess tissue rubbing. She is trying to find a prosthesis that is comfortable.  ? ?Review of Systems: ?A complete review of systems was obtained from the patient. I have reviewed this information and discussed as appropriate with the patient. See HPI as well for other ROS. ? ?Review of Systems  ?All other systems reviewed and are negative. ? ? ?Medical History: ?Past Medical History:  ?Diagnosis Date  ? History of cancer  ? ?Patient Active Problem List  ?Diagnosis  ? Malignant neoplasm of upper-outer quadrant of right breast in female, estrogen receptor positive (CMS-HCC)  ? Family history of breast cancer  ? ?Past Surgical History:  ?Procedure Laterality Date  ? Breast Augmentation (bilateral) 09/05/1999  ? Carpal Tunnel (Right) 02/2011  ? Port-A-Cath Placement 09/17/2020  ?Dr. Barry Dienes  ? Right Total Mastectomy with Right Axillary Lymph Node Dissection; Removal Breast Implants; Left Total Mastectomy 04/25/2021  ?Dr. Barry Dienes  ? ? ?No Known Allergies ? ?Current Outpatient Medications on File Prior to Visit  ?Medication Sig Dispense Refill  ? gabapentin (NEURONTIN) 100 MG capsule Take 2 capsules (200 mg total) by mouth 2 (two) times daily  ? multivitamin tablet Take by mouth.  ? acetaminophen (TYLENOL) 500 MG tablet Take by mouth (Patient not taking: Reported on 08/30/2021)  ?  methocarbamoL (ROBAXIN) 500 MG tablet Take by mouth (Patient not taking: Reported on 08/30/2021)  ? ?No current facility-administered medications on file prior to visit.  ? ?Family History  ?Problem Relation Age of Onset  ? Breast cancer Mother  ? High blood pressure (Hypertension) Father  ? Coronary Artery Disease (Blocked arteries around heart) Father  ? ? ?Social History  ? ?Tobacco Use  ?Smoking Status Never  ?Smokeless Tobacco Never  ? ? ?Social History  ? ?Socioeconomic History  ? Marital status: Unknown  ?Tobacco Use  ? Smoking status: Never  ?  Smokeless tobacco: Never  ?Substance and Sexual Activity  ? Alcohol use: No  ? Drug use: Never  ? Sexual activity: Defer  ? ?Objective:  ? ?There were no vitals filed for this visit.  ?There is no height or weight on file to calculate BMI. ? ?Head: Normocephalic and atraumatic.  ?Eyes: Conjunctivae are normal. Pupils are equal, round, and reactive to light. No scleral icterus.  ?Neck: Normal range of motion. Neck supple. No tracheal deviation present. No thyromegaly present.  ?Resp: No respiratory distress, normal effort. ?Breast:surgically absent bilaterally. No seroma. Excess tissue medially with some irritation. Port in place on left.  ?Abd: Abdomen is soft, non distended and non tender. No masses are palpable. There is no rebound and no guarding.  ?Neurological: Alert and oriented to person, place, and time. Coordination normal.  ?Skin: Skin is warm and dry. No rash noted. No diaphoretic. No erythema. No pallor.  ?Psychiatric: Normal mood and affect. Normal behavior. Judgment and thought content normal.  ? ?Labs, Imaging and Diagnostic Testing: ? ?Labs 07/10/2021 ?CBC and cmet essentially normal.  ? ?Assessment and Plan:  ? ?Diagnoses and all orders for this visit: ? ?Malignant neoplasm of upper-outer quadrant of right breast in female, estrogen receptor positive (CMS-HCC) ? ?Family history of breast cancer ?Assessment & Plan: ?Negative genetic testing.  ? ? ?I will schedule port removal and left scar revision. ?Discussed surgery and risks.  ? ?No follow-ups on file. ? ?Georgianne Fick, MD  ?

## 2021-10-15 NOTE — Transfer of Care (Signed)
Immediate Anesthesia Transfer of Care Note ? ?Patient: Melissa Mcgrath ? ?Procedure(s) Performed: REMOVAL PORT-A-CATH ?REVISION OF LEFT MASTECTOMY SCAR (Left) ? ?Patient Location: PACU ? ?Anesthesia Type:General ? ?Level of Consciousness: awake, patient cooperative and responds to stimulation ? ?Airway & Oxygen Therapy: Patient Spontanous Breathing and Patient connected to nasal cannula oxygen ? ?Post-op Assessment: Report given to RN, Post -op Vital signs reviewed and stable and Patient moving all extremities X 4 ? ?Post vital signs: Reviewed and stable ? ?Last Vitals:  ?Vitals Value Taken Time  ?BP 132/72 10/15/21 1105  ?Temp    ?Pulse 93 10/15/21 1105  ?Resp 15 10/15/21 1105  ?SpO2 99 % 10/15/21 1105  ?Vitals shown include unvalidated device data. ? ?Last Pain:  ?Vitals:  ? 10/15/21 0748  ?TempSrc:   ?PainSc: 0-No pain  ?   ? ?Patients Stated Pain Goal: 0 (10/15/21 0748) ? ?Complications: No notable events documented. ?

## 2021-10-15 NOTE — Op Note (Addendum)
?  PRE-OPERATIVE DIAGNOSIS:  un-needed Port-A-Cath after completion of right breast cancer treatment, revision of left mastectomy scar ? ?POST-OPERATIVE DIAGNOSIS:  Same  ? ?PROCEDURE:  Procedure(s):  REMOVAL PORT-A-CATH and revision of left mastectomy scar ? ?SURGEON:  Surgeon(s):  Stark Klein, MD ? ?Asst: Cameron Sprang, MD ? ?ANESTHESIA:   General + local ? ?EBL:   Minimal ? ?SPECIMEN:  left mastectomy scar ? ?FINDINGS: Scar tissue and fatty tissue.   ? ?Complications : none known ? ?Procedure:   ?Pt was  identified in the holding area and taken to the operating room where she was placed supine on the operating room table.  MAC anesthesia was induced.  The left chest was prepped and draped.  The prior port incision was anesthetized with local anesthetic.  The incision was opened with a #15 blade.  The subcutaneous tissue was divided with the cautery.  The port was identified and the capsule opened.  The four 2-0 prolene sutures were removed.  The port was then removed and pressure held on the tract.  The catheter appeared intact without evidence of breakage, length was 19.5 cm.  The wound was inspected for hemostasis, which was achieved with cautery.  The wound was closed with 3-0 vicryl deep dermal interrupted sutures and 4-0 Monocryl running subcuticular suture.  The wound was cleaned, dried, and dressed with dermabond.  ? ?Patient was turned to the left mastectomy site an elliptical incision of the medial's scar and redundant skin was made with a 10 blade.  The central ellipse of skin was excised with cautery.  The dissection was deepened underneath the skin flaps superiorly and inferiorly to excise excess remaining subcutaneous tissue. The total amount of tissue removed was 7 x 1.5 x 3.5 cm.  The dissection was taken down to the chest wall and the excess subcutaneous tissue was removed.  A 15 [place] Blake drain was placed and sutured to the skin with 3-0 Vicryl.  The wound was inspected for hemostasis, which was  achieved with cautery. The wound was closed with 3-0 Vicryl deep dermal interrupted sutures and 4-0 Monocryl running subcuticular suture.  The wound was cleaned, dried, and dressed with Dermabond and Steri-Strips ? ? The patient was awakened from anesthesia and taken to the PACU in stable condition.  Needle, sponge, and instrument counts are correct.   ? ? ?

## 2021-10-15 NOTE — Discharge Instructions (Addendum)
[facility], [place] ?[phone] ? ?POST OP INSTRUCTIONS ? ?Always review your discharge instruction sheet given to you by the facility where your surgery was performed. ?IF YOU HAVE DISABILITY OR FAMILY LEAVE FORMS, YOU MUST BRING THEM TO THE OFFICE FOR PROCESSING.   ?DO NOT GIVE THEM TO YOUR DOCTOR. ?Take 2 tylenol (acetominophen) three times a day for 3 days.  If you still have pain, add ibuprofen with food in between if able to take this (if you have kidney issues or stomach issues, do not take ibuprofen).   ? ?If both of those are not enough, add the narcotic pain pill.   ? ?If you find you are needing a lot of this overnight after surgery, call the next morning for a refill.   ? ?Take your usually prescribed medications unless otherwise directed. ?If you need a refill on your pain medication, please contact your pharmacy.  They will contact our office to request authorization.  Prescriptions will not be filled after 5pm or on week-ends. ?You should follow a light diet the first few days after arrival home, such as soup and crackers, etc.  Resume your normal diet the day after surgery. ?Most patients will experience some swelling and bruising on the chest and underarm.  Ice packs will help.  Swelling and bruising can take several days to resolve.  ?It is common to experience some constipation if taking pain medication after surgery.  Increasing fluid intake and taking a stool softener (such as Colace) will usually help or prevent this problem from occurring.  A mild laxative (Milk of Magnesia or Miralax) should be taken according to package instructions if there are no bowel movements after 48 hours. ?You may remove the gauze from under the binder and replace as needed.  Keep the drains pinned under the binder unless you are emptying them.  SHORT showers only are permitted after post op day 3.  ?DRAINS:  If you have drains in place, it is important to keep a list of the amount of drainage  produced each day in your drains.  Before leaving the hospital, you should be instructed on drain care.  Call our office if you have any questions about your drains. ?ACTIVITIES:  You may resume regular (light) daily activities beginning the next day--such as daily self-care, walking, climbing stairs--gradually increasing activities as tolerated.  You may have sexual intercourse when it is comfortable.  Refrain from any heavy lifting or straining until approved by your doctor. ?You may drive when you are no longer taking prescription pain medication, you can comfortably wear a seatbelt, and you can safely maneuver your car and apply brakes. ?RETURN TO WORK:  _____1-4 weeks as tolerated if approved._________________________ ?You should see your doctor in the office for a follow-up appointment approximately 3-5 days after your surgery.  Your doctor?s nurse will typically make your follow-up appointment when she calls you with your pathology report.  Expect your pathology report 2-3 business days after your surgery.  You may call to check if you do not hear from [place] after three days.   ? ?WHEN TO CALL YOUR DOCTOR: ?Fever over 101.0 ?Nausea and/or vomiting ?Extreme swelling or bruising ?Continued bleeding from incision. ?Increased pain, redness, or drainage from the incision. ?The clinic staff is available to answer your questions during regular business hours.  Please don?t hesitate to call and ask to speak to one of the nurses for clinical concerns.  If you have a medical emergency, go to the nearest emergency  room or call 911.  A surgeon from [facility] is always on call at the hospital. ?[address] [place], [place], [place]  [address] ? [address] [place], [place],    [address] ?[phone] ? [phone] ? FAX [phone] ?Web site: [email or web address] ? ?

## 2021-10-16 ENCOUNTER — Encounter (HOSPITAL_COMMUNITY): Payer: Self-pay | Admitting: General Surgery

## 2021-10-16 LAB — SURGICAL PATHOLOGY

## 2021-10-31 NOTE — Progress Notes (Signed)
Patient Care Team: Pablo Lawrence, NP as PCP - General (Adult Health Nurse Practitioner) Stark Klein, MD as Consulting Physician (General Surgery) Gery Pray, MD as Consulting Physician (Radiation Oncology) Dian Queen, MD as Consulting Physician (Obstetrics and Gynecology) Ulla Gallo, MD as Consulting Physician (Dermatology) Nicholas Lose, MD as Consulting Physician (Hematology and Oncology)  DIAGNOSIS:  Encounter Diagnosis  Name Primary?   Malignant neoplasm of upper-outer quadrant of right breast in female, estrogen receptor positive ([place])     SUMMARY OF ONCOLOGIC HISTORY: Oncology History  Malignant neoplasm of upper-outer quadrant of right breast in female, estrogen receptor positive ([place])  08/21/2020 Initial Diagnosis   right breast upper outer quadrant biopsy 08/21/2020 for a clinical mT2 N1, stage IIA/B invasive ductal carcinoma, grade 3, estrogen receptor positive, progesterone receptor weakly positive, HER-2 not amplified, with an MIB-1 of 40%   08/29/2020 Cancer Staging   Staging form: Breast, AJCC 8th Edition - Clinical stage from 08/29/2020: Stage IIIA (cT2, cN2, cM0, G3, ER+, PR+, HER2-) - Signed by Gardenia Phlegm, NP on 09/07/2020 Stage prefix: Initial diagnosis Stage used in treatment planning: Yes National guidelines used in treatment planning: Yes Type of national guideline used in treatment planning: NCCN    09/18/2020 - 03/01/2021 Chemotherapy   AC X 4 foll by Taxol X 12    09/18/2020 Genetic Testing   Negative genetic testing:  No pathogenic variants detected on the [employer] CancerNext-Expanded + RNAinsight panel. The report date is 09/18/2020.   The CancerNext-Expanded + RNAinsight gene panel offered by [employer] and includes sequencing and rearrangement analysis for the following 77 genes: AIP, ALK, APC, ATM, AXIN2, BAP1, BARD1, BLM, BMPR1A, BRCA1, BRCA2, BRIP1, CDC73, CDH1, CDK4, CDKN1B, CDKN2A, CHEK2, CTNNA1, DICER1, FANCC, FH, FLCN,  GALNT12, KIF1B, LZTR1, MAX, MEN1, MET, MLH1, MSH2, MSH3, MSH6, MUTYH, NBN, NF1, NF2, NTHL1, PALB2, PHOX2B, PMS2, POT1, PRKAR1A, PTCH1, PTEN, RAD51C, RAD51D, RB1, RECQL, RET, SDHA, SDHAF2, SDHB, SDHC, SDHD, SMAD4, SMARCA4, SMARCB1, SMARCE1, STK11, SUFU, TMEM127, TP53, TSC1, TSC2, VHL and XRCC2 (sequencing and deletion/duplication); EGFR, EGLN1, HOXB13, KIT, MITF, PDGFRA, POLD1 and POLE (sequencing only); EPCAM and GREM1 (deletion/duplication only). RNA data is routinely analyzed for use in variant interpretation for all genes.    Surgery   bilateral mastectomies with right axillary lymph nodes dissection on 04/25/2021 showing (A) on the left, no evidence of malignancy (B) on the right a residual ypT1c ypN0 invasive ductal carcinoma, grade 3, with a focally positive deep margin a total of 24 right axillary and 2 right intramammary lymph nodes were removed, all clear   06/12/2021 - 07/24/2021 Radiation Therapy   Adj XRT     CHIEF COMPLIANT:  Follow-up on Letrozole    INTERVAL HISTORY: Melissa Mcgrath is a 61 y.o. with above-mentioned history of estrogen receptor positive breast cancer. She presents to the clinic today for follow-up. She is tolerating the letrozole. Denies hot flashes. She states she has a little of the body and joint aches.  She is here to discuss the pros and cons of starting abemaciclib.   ALLERGIES:  is allergic to peanut-containing drug products.  MEDICATIONS:  Current Outpatient Medications  Medication Sig Dispense Refill   abemaciclib (VERZENIO) 50 MG tablet Take 1 tablet (50 mg total) by mouth 2 (two) times daily. Swallow tablets whole. Do not chew, crush, or split tablets before swallowing. 60 tablet 0   acetaminophen (TYLENOL) 650 MG CR tablet Take 650 mg by mouth every 8 (eight) hours as needed for pain.  letrozole (FEMARA) 2.5 MG tablet Take 1 tablet (2.5 mg total) by mouth daily. 90 tablet 3   valACYclovir (VALTREX) 500 MG tablet Take 500 mg by mouth 2 (two)  times daily as needed (for fever bilsters outbreak).     No current facility-administered medications for this visit.    PHYSICAL EXAMINATION: ECOG PERFORMANCE STATUS: 1 - Symptomatic but completely ambulatory  Vitals:   11/07/21 1139  BP: (!) 111/46  Pulse: 73  Resp: 18  Temp: 97.7 F (36.5 C)  SpO2: 100%   Filed Weights   11/07/21 1139  Weight: 139 lb 3.2 oz (63.1 kg)      LABORATORY DATA:  I have reviewed the data as listed    Latest Ref Rng & Units 10/10/2021   11:22 AM 07/10/2021   10:08 AM 05/01/2021    3:26 PM  CMP  Glucose 70 - 99 mg/dL 101   94   93    BUN 6 - 20 mg/dL _0 Creatinine 0.44 - 1.00 mg/dL 0.83   0.79   0.93    Sodium 135 - 145 mmol/L 141   141   141    Potassium 3.5 - 5.1 mmol/L 3.8   4.2   4.0    Chloride 98 - 111 mmol/L 106   106   107    CO2 22 - 32 mmol/L _1 Calcium 8.9 - 10.3 mg/dL 9.5   9.8   9.0    Total Protein 6.5 - 8.1 g/dL  7.2   6.6    Total Bilirubin 0.3 - 1.2 mg/dL  0.6   0.2    Alkaline Phos 38 - 126 U/L  68   69    AST 15 - 41 U/L  14   20    ALT 0 - 44 U/L  10   28      Lab Results  Component Value Date   WBC 4.8 10/10/2021   HGB 12.6 10/10/2021   HCT 38.4 10/10/2021   MCV 98.5 10/10/2021   PLT 226 10/10/2021   NEUTROABS 3.3 07/10/2021    ASSESSMENT & PLAN:  Malignant neoplasm of upper-outer quadrant of right breast in female, estrogen receptor positive ([place]) 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%, PR 2%, HER-2 not amplified, with an MIB-1 of 40% T2N2 Stage 3A Neoadj chemo with AC-T completed 10/26/20 Bil mastectomies 04/25/21: Rt: 1.6 cm, grade 3, Deep margin pos, Perineural inv present, 0/24 Axln, 0/2 Intra mamm LN Neg  Adj XRT completed 07/24/21   Treatment Plan: Adj Anti estrogen therapy with letrozole started 07/10/2021 and abemaciclib started 11/09/2021 Letrozole toxicities:  We discussed the pros and  cons of starting Verzinio. Return to clinic in 2 weeks to follow-up with Jenny Reichmann to assess tolerance to Verzinio    No orders of the defined types were placed in this encounter.  The patient has a good understanding of the overall plan. she agrees with it. she will call with any problems that may develop before the next visit here. Total time spent: 30 mins including face to face time and time spent for planning, charting and co-ordination of care   Harriette Ohara, MD 11/07/21    I Gardiner Coins am scribing for Dr. Lindi Adie  I have reviewed the above documentation for accuracy and completeness, and I agree  with the above.

## 2021-11-07 ENCOUNTER — Telehealth: Payer: Self-pay | Admitting: Pharmacy Technician

## 2021-11-07 ENCOUNTER — Telehealth: Payer: Self-pay

## 2021-11-07 ENCOUNTER — Other Ambulatory Visit: Payer: Self-pay

## 2021-11-07 ENCOUNTER — Inpatient Hospital Stay: Payer: No Typology Code available for payment source | Attending: Adult Health | Admitting: Hematology and Oncology

## 2021-11-07 ENCOUNTER — Other Ambulatory Visit (HOSPITAL_COMMUNITY): Payer: Self-pay

## 2021-11-07 DIAGNOSIS — Z923 Personal history of irradiation: Secondary | ICD-10-CM | POA: Insufficient documentation

## 2021-11-07 DIAGNOSIS — C50411 Malignant neoplasm of upper-outer quadrant of right female breast: Secondary | ICD-10-CM | POA: Insufficient documentation

## 2021-11-07 DIAGNOSIS — Z170 Estrogen receptor positive status [ER+]: Secondary | ICD-10-CM | POA: Diagnosis not present

## 2021-11-07 DIAGNOSIS — Z9013 Acquired absence of bilateral breasts and nipples: Secondary | ICD-10-CM | POA: Insufficient documentation

## 2021-11-07 MED ORDER — ABEMACICLIB 50 MG PO TABS
50.0000 mg | ORAL_TABLET | Freq: Two times a day (BID) | ORAL | 0 refills | Status: DC
  Filled 2021-11-07: qty 70, 35d supply, fill #0
  Filled 2021-11-07: qty 56, 28d supply, fill #0

## 2021-11-07 NOTE — Assessment & Plan Note (Signed)
Right breast upper outer quadrant biopsy 08/21/2020 for a clinical mT2 N1, stage IIA/Binvasive ductal carcinoma, grade 3, ER 80%, PR 2%, HER-2 not amplified, with an MIB-1 of 40% T2N2 Stage 3A Neoadj chemo with AC-T completed 10/26/20 Bil mastectomies 04/25/21: Rt: 1.6 cm, grade 3, Deep margin pos, Perineural inv present, 0/24 Axln, 0/2 Intra mamm LN Neg  Adj XRT completed 07/24/21  Treatment Plan: Adj Anti estrogen therapy with letrozole started 07/10/2021 and abemaciclib started 11/09/2021 Letrozole toxicities:  We discussed the pros and cons of starting Verzinio. Return to clinic in 2 weeks to follow-up with Melissa Mcgrath to assess tolerance to [employer]

## 2021-11-07 NOTE — Telephone Encounter (Signed)
Oral Oncology Patient Advocate Encounter   Received notification that prior authorization for Verzenio is required.   PA submitted on 11/07/2021 Key [id number] Status is pending     Melissa Mcgrath, CPhT-Adv Pharmacy Patient Advocate Specialist [facility] Patient Advocate Team Direct Number: [phone]  Fax: [phone]

## 2021-11-07 NOTE — Telephone Encounter (Signed)
Oral Oncology Pharmacist Encounter  Received new prescription for abemaciclib (Verzenio) for the treatment of high risk, early, hormone receptor positive breast cancer in conjunction with letrozole, planned duration until disease progression or unacceptable toxicity or for a total of 2 years.  Labs from 10/10/21 assessed, no interventions needed. Prescription dose and frequency assessed.  Current medication list in Epic reviewed, DDIs with Verzenio identified: none  Evaluated chart and no patient barriers to medication adherence noted.   Patient agreement for treatment documented in MD note on 11/06/2021.  Prescription has been e-scribed to the [facility] for benefits analysis and approval.  Oral Oncology Clinic will continue to follow for insurance authorization, copayment issues, initial counseling and start date.  Drema Halon, PharmD Hematology/Oncology Clinical Pharmacist [facility] [phone] 11/07/2021 1:58 PM

## 2021-11-07 NOTE — Telephone Encounter (Signed)
Oral Oncology Patient Advocate Encounter  Prior Authorization for Melynda Keller has been approved.    PA# [id number] Effective dates: 11/07/2021 through 11/08/2022  Patients co-pay is $0 at [facility].   Lady Deutscher, CPhT-Adv Pharmacy Patient Advocate Specialist [facility] Patient Advocate Team Direct Number: [phone]  Fax: [phone]

## 2021-11-08 NOTE — Telephone Encounter (Signed)
Oral Chemotherapy Pharmacist Encounter  I spoke with patient for overview of: Verzenio for the treatment of high risk, hormone-receptor positive breast cancer, in combination with letrozole, planned duration until disease progression or unacceptable toxicity.   Counseled patient on administration, dosing, side effects, monitoring, drug-food interactions, safe handling, storage, and disposal.  Patient will take Verzenio '50mg'$  tablets, 1 tablet by mouth twice daily without regard to food.  Patient knows to avoid grapefruit and grapefruit juice.  Verzenio start date: 11/09/2021  Adverse effects include but are not limited to: diarrhea, fatigue, nausea, abdominal pain, decreased blood counts, and increased liver function tests, and joint pains. Severe, life-threatening, and/or fatal interstitial lung disease (ILD) and/or pneumonitis may occur with CDK 4/6 inhibitors.  Patient has anti-emetic on hand and knows to take it if nausea develops.   Patient will obtain anti diarrheal and alert the office of 4 or more loose stools above baseline.  Reviewed with patient importance of keeping a medication schedule and plan for any missed doses. No barriers to medication adherence identified.  Medication reconciliation performed and medication/allergy list updated.  Insurance authorization for [employer] has been obtained. Test claim at the pharmacy revealed copayment $0 for 1st fill of 28 days . This will ship from the [place] on 11/07/21 to deliver to patient's home on 11/08/21.  Patient informed the pharmacy will reach out 5-7 days prior to needing next fill of Verzenio to coordinate continued medication acquisition to prevent break in therapy.  All questions answered.  Mrs. Dungee voiced understanding and appreciation.   Medication education handout placed in mail for patient. Patient knows to call the office with questions or concerns. Oral Chemotherapy Clinic phone number  provided to patient.   Drema Halon, PharmD Hematology/Oncology Clinical Pharmacist [facility] [phone] 11/08/2021   9:49 AM

## 2021-11-12 ENCOUNTER — Other Ambulatory Visit (HOSPITAL_COMMUNITY): Payer: Self-pay

## 2021-11-21 ENCOUNTER — Other Ambulatory Visit (HOSPITAL_COMMUNITY): Payer: Self-pay

## 2021-11-21 ENCOUNTER — Inpatient Hospital Stay: Payer: No Typology Code available for payment source | Admitting: Pharmacist

## 2021-11-21 ENCOUNTER — Inpatient Hospital Stay: Payer: No Typology Code available for payment source

## 2021-11-25 ENCOUNTER — Other Ambulatory Visit (HOSPITAL_COMMUNITY): Payer: Self-pay

## 2021-11-25 ENCOUNTER — Other Ambulatory Visit: Payer: Self-pay

## 2021-11-25 DIAGNOSIS — Z170 Estrogen receptor positive status [ER+]: Secondary | ICD-10-CM

## 2021-11-26 ENCOUNTER — Other Ambulatory Visit (HOSPITAL_COMMUNITY): Payer: Self-pay

## 2021-11-26 ENCOUNTER — Other Ambulatory Visit: Payer: Self-pay

## 2021-11-26 ENCOUNTER — Inpatient Hospital Stay: Payer: No Typology Code available for payment source | Attending: Adult Health

## 2021-11-26 ENCOUNTER — Inpatient Hospital Stay: Payer: No Typology Code available for payment source | Admitting: Pharmacist

## 2021-11-26 VITALS — BP 109/47 | HR 86 | Temp 98.1°F | Resp 16 | Ht 68.0 in | Wt 138.8 lb

## 2021-11-26 DIAGNOSIS — Z170 Estrogen receptor positive status [ER+]: Secondary | ICD-10-CM | POA: Diagnosis not present

## 2021-11-26 DIAGNOSIS — C50411 Malignant neoplasm of upper-outer quadrant of right female breast: Secondary | ICD-10-CM | POA: Insufficient documentation

## 2021-11-26 LAB — CBC WITH DIFFERENTIAL (CANCER CENTER ONLY)
Abs Immature Granulocytes: 0.01 10*3/uL (ref 0.00–0.07)
Basophils Absolute: 0.1 10*3/uL (ref 0.0–0.1)
Basophils Relative: 2 %
Eosinophils Absolute: 0.1 10*3/uL (ref 0.0–0.5)
Eosinophils Relative: 3 %
HCT: 35.6 % — ABNORMAL LOW (ref 36.0–46.0)
Hemoglobin: 11.9 g/dL — ABNORMAL LOW (ref 12.0–15.0)
Immature Granulocytes: 0 %
Lymphocytes Relative: 23 %
Lymphs Abs: 0.7 10*3/uL (ref 0.7–4.0)
MCH: 32.9 pg (ref 26.0–34.0)
MCHC: 33.4 g/dL (ref 30.0–36.0)
MCV: 98.3 fL (ref 80.0–100.0)
Monocytes Absolute: 0.2 10*3/uL (ref 0.1–1.0)
Monocytes Relative: 8 %
Neutro Abs: 2 10*3/uL (ref 1.7–7.7)
Neutrophils Relative %: 64 %
Platelet Count: 192 10*3/uL (ref 150–400)
RBC: 3.62 MIL/uL — ABNORMAL LOW (ref 3.87–5.11)
RDW: 12.2 % (ref 11.5–15.5)
WBC Count: 3.1 10*3/uL — ABNORMAL LOW (ref 4.0–10.5)
nRBC: 0 % (ref 0.0–0.2)

## 2021-11-26 LAB — CMP (CANCER CENTER ONLY)
ALT: 10 U/L (ref 0–44)
AST: 15 U/L (ref 15–41)
Albumin: 4.3 g/dL (ref 3.5–5.0)
Alkaline Phosphatase: 75 U/L (ref 38–126)
Anion gap: 4 — ABNORMAL LOW (ref 5–15)
BUN: 10 mg/dL (ref 8–23)
CO2: 29 mmol/L (ref 22–32)
Calcium: 10 mg/dL (ref 8.9–10.3)
Chloride: 107 mmol/L (ref 98–111)
Creatinine: 1.09 mg/dL — ABNORMAL HIGH (ref 0.44–1.00)
GFR, Estimated: 58 mL/min — ABNORMAL LOW (ref >60)
Glucose, Bld: 95 mg/dL (ref 70–99)
Potassium: 3.9 mmol/L (ref 3.5–5.1)
Sodium: 140 mmol/L (ref 135–145)
Total Bilirubin: 0.6 mg/dL (ref 0.3–1.2)
Total Protein: 7.2 g/dL (ref 6.5–8.1)

## 2021-11-26 MED ORDER — ONDANSETRON HCL 8 MG PO TABS
8.0000 mg | ORAL_TABLET | Freq: Three times a day (TID) | ORAL | 1 refills | Status: DC | PRN
Start: 2021-11-26 — End: 2022-05-19

## 2021-11-26 MED ORDER — ABEMACICLIB 50 MG PO TABS
50.0000 mg | ORAL_TABLET | Freq: Two times a day (BID) | ORAL | 0 refills | Status: DC
  Filled 2021-11-26 – 2021-12-12 (×2): qty 56, 28d supply, fill #0

## 2021-11-26 MED ORDER — PROCHLORPERAZINE MALEATE 10 MG PO TABS: 10.0000 mg | ORAL_TABLET | Freq: Four times a day (QID) | ORAL | 2 refills | Status: DC | PRN

## 2021-11-26 NOTE — Progress Notes (Signed)
[place]       Telephone: [phone]?Fax: [phone]   Oncology Clinical Pharmacist Practitioner Initial Assessment  Melissa Mcgrath is a 61 y.o. female with a diagnosis of breast cancer. They were contacted today via in person visit.  Indication/Regimen Abemaciclib (Verzenio) is being used appropriately for treatment of breast cancer in the adjuvant setting by Dr. Nicholas Lose.      Wt Readings from Last 1 Encounters:  11/26/21 138 lb 12.8 oz (63 kg)    Estimated body surface area is 1.74 meters squared as calculated from the following:   Height as of this encounter: '5\' 8"'$  (1.727 m).   Weight as of this encounter: 138 lb 12.8 oz (63 kg).  The dosing regimen is 50 mg by mouth every 12 hours on days 1 to 28 of a 28-day cycle. This is being given in combination with letrozole. It is planned to continue until 2 years in the adjuvant setting per the [facility] E trial data.  Melissa Mcgrath was seen today by clinical pharmacy to establish care for her abemaciclib management.  She last saw Dr. Lindi Adie on 11/07/21 and he has referred her to clinical pharmacy.  Overall, Melissa Mcgrath states that she is doing well.  She reports starting abemaciclib on 11/09/21.  She is having some nausea but no vomiting.  She is also reporting lack of appetite.  We did give her a handout today with regard to abemaciclib education and some dietary options.  We have also prescribed prochlorperazine and ondansetron to be taken as needed and reviewed these medications in detail.  She does report that since starting abemaciclib, she feels that she is more out of breath lately.  She is not reporting cough and her O2 saturations today were 100%.  Because there is a slight risk of abemaciclib causing interstitial lung disease, we have reviewed her symptoms with Dr. Chryl Heck and Dr. Geralyn Flash absence, and we will order a CT chest to rule out interstitial lung disease.  We have also told Melissa Mcgrath to hold  abemaciclib until these results are back.  She verbalized understanding of the plan.  She normally takes her abemaciclib at 9 AM and 9 PM and we reviewed what to do should she miss a dose.  We also went over the potential side effects of abemaciclib which include but are not limited to diarrhea, neutropenia, interstitial lung disease, liver toxicity, blood clots, fatigue, alopecia, nausea, vomiting, and dysgeusia.  We reviewed with Melissa Mcgrath that manufacturing guidelines recommend labs every 2 weeks for 2 months followed by monthly labs for 2 months and then as clinically indicated.  If her CT scan is negative, we will plan on seeing Melissa Mcgrath again on 12/10/21 with labs and on 01/07/22 with labs.  She will see Dr. Lindi Adie in the interim with labs on 12/23/21.  At this point, Melissa Mcgrath has no intention of increasing the dose of abemaciclib due to her current side effects but this could change later on if she feels she is tolerating it better.   Dose Modifications Abemaciclib 50 mg every 12 hours per Dr. Geralyn Flash instructions.  May increase dose if tolerated.  Access Assessment Melissa Mcgrath will be receiving abemaciclib through [facility] outpatient pharmacy.  New prescription sent today Insurance Concerns: None Start date if known: 11/09/21  Allergies Allergies  Allergen Reactions   Peanut-Containing Drug Products Nausea And Vomiting    Vitals    11/26/2021   12:39 PM 11/07/2021  11:39 AM 10/15/2021   11:50 AM  Vitals with BMI  Height '5\' 8"'$  '5\' 8"'$    Weight 138 lbs 13 oz 139 lbs 3 oz   BMI 23.76 28.31   Systolic 517 616 073  Diastolic 47 46 61  Pulse 86 73 90     Laboratory Data    Latest Ref Rng & Units 11/26/2021   11:52 AM 10/10/2021   11:22 AM 07/10/2021   10:08 AM  CBC EXTENDED  WBC 4.0 - 10.5 K/uL 3.1   4.8   4.7    RBC 3.87 - 5.11 MIL/uL 3.62   3.90   3.92    Hemoglobin 12.0 - 15.0 g/dL 11.9   12.6   12.6    HCT 36.0 - 46.0 % 35.6   38.4   37.0    Platelets 150  - 400 K/uL 192   226   199    NEUT# 1.7 - 7.7 K/uL 2.0    3.3    Lymph# 0.7 - 4.0 K/uL 0.7    0.6         Latest Ref Rng & Units 11/26/2021   11:52 AM 10/10/2021   11:22 AM 07/10/2021   10:08 AM  CMP  Glucose 70 - 99 mg/dL 95   101   94    BUN 8 - 23 mg/dL '10   12   15    '$ Creatinine 0.44 - 1.00 mg/dL 1.09   0.83   0.79    Sodium 135 - 145 mmol/L 140   141   141    Potassium 3.5 - 5.1 mmol/L 3.9   3.8   4.2    Chloride 98 - 111 mmol/L 107   106   106    CO2 22 - 32 mmol/L '29   28   29    '$ Calcium 8.9 - 10.3 mg/dL 10.0   9.5   9.8    Total Protein 6.5 - 8.1 g/dL 7.2    7.2    Total Bilirubin 0.3 - 1.2 mg/dL 0.6    0.6    Alkaline Phos 38 - 126 U/L 75    68    AST 15 - 41 U/L 15    14    ALT 0 - 44 U/L 10    10     No results found for: MG   Contraindications Contraindications were reviewed?  Yes Contraindications to therapy were identified?  No  Safety Precautions The following safety precautions for the use of abemaciclib were reviewed:  Diarrhea: we reviewed that diarrhea is common with abemaciclib and confirmed that she does have loperamide (Imodium) at home.  We reviewed how to take this medication PRN and gave her information on abemaciclib Neutropenia: we discussed the importance of having a thermometer and what the Centers for Disease Control and Prevention (CDC) considers a fever which is 100.6F (38C) or higher.  Gave patient 24/7 triage line to call if any fevers or symptoms ILD/Pneumonitis: we reviewed potential symptoms including cough, shortness, and fatigue. Hepatotoxicity: reviewed to contact clinic for RUQ pain that will not subside, yellowing of eyes/skin Venous thromboembolism (VTE): reviewed signs of deep vein thrombosis (DVT) such as leg swelling, redness, pain, or tenderness and signs of pulmonary embolism (PE) such as shortness of breath, rapid or irregular heartbeat, cough, chest pain, or lightheadedness Reviewed to take the medication every 12 hours (with food  sometimes can be easier on the stomach) and to take it at the same time  every day. Discussed proper storage and handling of abemaciclib We reviewed the importance of drinking plenty of fluids and monitoring urine output.  Her serum creatinine was slightly elevated today.  We will continue to monitor. We reviewed that she should refrain from grapefruit products  Medication Reconciliation Current Outpatient Medications  Medication Sig Dispense Refill   letrozole (FEMARA) 2.5 MG tablet Take 1 tablet (2.5 mg total) by mouth daily. 90 tablet 3   abemaciclib (VERZENIO) 50 MG tablet Take 1 tablet (50 mg total) by mouth 2 (two) times daily. Swallow tablets whole. Do not chew, crush, or split tablets before swallowing. 56 tablet 0   acetaminophen (TYLENOL) 650 MG CR tablet Take 650 mg by mouth every 8 (eight) hours as needed for pain.     ondansetron (ZOFRAN) 8 MG tablet Take 1 tablet (8 mg total) by mouth every 8 (eight) hours as needed for nausea or vomiting. (Patient not taking: Reported on 11/26/2021) 30 tablet 1   prochlorperazine (COMPAZINE) 10 MG tablet Take 1 tablet (10 mg total) by mouth every 6 (six) hours as needed for nausea or vomiting. (Patient not taking: Reported on 11/26/2021) 30 tablet 2   valACYclovir (VALTREX) 500 MG tablet Take 500 mg by mouth 2 (two) times daily as needed (for fever bilsters outbreak). (Patient not taking: Reported on 11/26/2021)     No current facility-administered medications for this visit.    Medication reconciliation is based on the patient's most recent medication list in the electronic medical record (EMR) including herbal products and OTC medications.   The patient's medication list was reviewed today with the patient?  Yes  Drug-drug interactions (DDIs) DDIs were evaluated?  Yes Significant DDIs identified?  No  Drug-Food Interactions Drug-food interactions were evaluated?  Yes Drug-food interactions identified?  Yes, refrain from grapefruit  products  Follow-up Plan  Hold abemaciclib.  Obtain CT chest to rule out interstitial lung disease due to increased shortness of breath since starting abemaciclib. Continue letrozole Prescriptions for prochlorperazine and ondansetron sent to pharmacy of choice Abemaciclib prescription sent to the [facility] outpatient specialty pharmacy We will add labs in pharmacy clinic visit on 12/10/21 and 01/07/22 We will add labs and Dr. Lindi Adie visit on 12/23/21  [name] participated in the discussion, expressed understanding, and voiced agreement with the above plan. All questions were answered to her satisfaction. The patient was advised to contact the clinic at [phone] with any questions or concerns prior to her return visit.   I spent 60 minutes assessing the patient.  Raina Mina, RPH-CPP, 11/26/2021 2:00 PM  **Disclaimer: This note was dictated with voice recognition software. Similar sounding words can inadvertently be transcribed and this note may contain transcription errors which may not have been corrected upon publication of note.**

## 2021-11-27 ENCOUNTER — Other Ambulatory Visit (HOSPITAL_COMMUNITY): Payer: Self-pay

## 2021-11-27 ENCOUNTER — Telehealth: Payer: Self-pay | Admitting: Pharmacist

## 2021-11-27 NOTE — Telephone Encounter (Signed)
Scheduled appointment per 06/06 los. Left message.

## 2021-12-03 ENCOUNTER — Ambulatory Visit (HOSPITAL_COMMUNITY)
Admission: RE | Admit: 2021-12-03 | Discharge: 2021-12-03 | Disposition: A | Discharge status: Home / Self Care | Payer: No Typology Code available for payment source | Source: Ambulatory Visit | Attending: Hematology and Oncology | Admitting: Hematology and Oncology

## 2021-12-03 ENCOUNTER — Other Ambulatory Visit (HOSPITAL_COMMUNITY): Payer: Self-pay

## 2021-12-03 DIAGNOSIS — C50411 Malignant neoplasm of upper-outer quadrant of right female breast: Secondary | ICD-10-CM | POA: Diagnosis present

## 2021-12-03 DIAGNOSIS — Z170 Estrogen receptor positive status [ER+]: Secondary | ICD-10-CM | POA: Insufficient documentation

## 2021-12-04 ENCOUNTER — Other Ambulatory Visit (HOSPITAL_COMMUNITY): Payer: Self-pay

## 2021-12-05 ENCOUNTER — Other Ambulatory Visit (HOSPITAL_COMMUNITY): Payer: Self-pay

## 2021-12-09 ENCOUNTER — Other Ambulatory Visit (HOSPITAL_COMMUNITY): Payer: Self-pay

## 2021-12-09 ENCOUNTER — Other Ambulatory Visit: Payer: Self-pay

## 2021-12-09 DIAGNOSIS — Z170 Estrogen receptor positive status [ER+]: Secondary | ICD-10-CM

## 2021-12-10 ENCOUNTER — Inpatient Hospital Stay: Payer: No Typology Code available for payment source | Admitting: Pharmacist

## 2021-12-10 ENCOUNTER — Other Ambulatory Visit: Payer: Self-pay

## 2021-12-10 ENCOUNTER — Inpatient Hospital Stay: Payer: No Typology Code available for payment source

## 2021-12-10 VITALS — BP 99/56 | HR 70 | Temp 97.5°F | Resp 18 | Ht 68.0 in | Wt 139.4 lb

## 2021-12-10 DIAGNOSIS — C50411 Malignant neoplasm of upper-outer quadrant of right female breast: Secondary | ICD-10-CM | POA: Diagnosis not present

## 2021-12-10 LAB — CBC WITH DIFFERENTIAL (CANCER CENTER ONLY)
Abs Immature Granulocytes: 0 10*3/uL (ref 0.00–0.07)
Basophils Absolute: 0.1 10*3/uL (ref 0.0–0.1)
Basophils Relative: 2 %
Eosinophils Absolute: 0.1 10*3/uL (ref 0.0–0.5)
Eosinophils Relative: 3 %
HCT: 35.1 % — ABNORMAL LOW (ref 36.0–46.0)
Hemoglobin: 11.8 g/dL — ABNORMAL LOW (ref 12.0–15.0)
Immature Granulocytes: 0 %
Lymphocytes Relative: 25 %
Lymphs Abs: 0.9 10*3/uL (ref 0.7–4.0)
MCH: 33 pg (ref 26.0–34.0)
MCHC: 33.6 g/dL (ref 30.0–36.0)
MCV: 98 fL (ref 80.0–100.0)
Monocytes Absolute: 0.5 10*3/uL (ref 0.1–1.0)
Monocytes Relative: 13 %
Neutro Abs: 2.1 10*3/uL (ref 1.7–7.7)
Neutrophils Relative %: 57 %
Platelet Count: 232 10*3/uL (ref 150–400)
RBC: 3.58 MIL/uL — ABNORMAL LOW (ref 3.87–5.11)
RDW: 13 % (ref 11.5–15.5)
WBC Count: 3.6 10*3/uL — ABNORMAL LOW (ref 4.0–10.5)
nRBC: 0 % (ref 0.0–0.2)

## 2021-12-10 LAB — CMP (CANCER CENTER ONLY)
ALT: 9 U/L (ref 0–44)
AST: 15 U/L (ref 15–41)
Albumin: 4.2 g/dL (ref 3.5–5.0)
Alkaline Phosphatase: 77 U/L (ref 38–126)
Anion gap: 5 (ref 5–15)
BUN: 18 mg/dL (ref 8–23)
CO2: 30 mmol/L (ref 22–32)
Calcium: 9.6 mg/dL (ref 8.9–10.3)
Chloride: 107 mmol/L (ref 98–111)
Creatinine: 0.92 mg/dL (ref 0.44–1.00)
GFR, Estimated: >60 mL/min (ref >60)
Glucose, Bld: 93 mg/dL (ref 70–99)
Potassium: 4 mmol/L (ref 3.5–5.1)
Sodium: 142 mmol/L (ref 135–145)
Total Bilirubin: 0.4 mg/dL (ref 0.3–1.2)
Total Protein: 7.3 g/dL (ref 6.5–8.1)

## 2021-12-10 LAB — SAVE SMEAR(SSMR), FOR PROVIDER SLIDE REVIEW

## 2021-12-10 NOTE — Progress Notes (Signed)
[place]       Telephone: [phone]?Fax: [phone]   Oncology Clinical Pharmacist Practitioner Progress Note  Melissa Mcgrath was contacted via in-person to discuss her chemotherapy regimen for abemaciclib which they receive under the care of Dr. Nicholas Lose.  Current treatment regimen and start date Abemaciclib (11/09/21) Letrozole (07/10/21)  Interval History She continues to HOLD on abemaciclib 50 mg by mouth every 12 hours on days 1 to 28 of a 28-day cycle. This is being given  in combination with letrozole . Therapy is planned to continue until  2 years in the adjuvant setting per the [facility] trial data .   Response to Therapy Melissa Mcgrath is doing well.  She is here today to see clinical pharmacy and to discuss restarting abemaciclib.  She has been off it approximately 2 weeks after feeling she was more short of breath since starting abemaciclib.  Dr. Lindi Adie evaluated her CT of the chest and stated that it was not conclusive for pneumonitis.  He recommended another exam in 6 weeks and we will go ahead and place these orders today.  Melissa Mcgrath is ready to restart abemaciclib and we have instructed her to restart tomorrow at the same dose of 50 mg every 12 hours.  She knows that if she starts to have shortness of breath again that she can go ahead and discontinue abemaciclib at that time.  She does report that the breathing has gotten better since being off the abemaciclib for 2 weeks.  Her oxygen saturation levels remain at 100%.  She does report some slight dizziness that was brief the other day.  Her blood pressure does tend to run somewhat low.  She does state that she has been drinking plenty of fluids and her creatinine levels which were slightly elevated at her last visit have now normalized.  Labs, vitals, treatment parameters, and manufacturer guidelines assessing toxicity were reviewed with Melissa Mcgrath today. Based on these values, patient is in  agreement to continue abemaciclib therapy at this time.  Allergies Allergies  Allergen Reactions   Peanut-Containing Drug Products Nausea And Vomiting    Vitals    11/26/2021   12:39 PM 11/07/2021   11:39 AM 10/15/2021   11:50 AM  Vitals with BMI  Height '5\' 8"'$  '5\' 8"'$    Weight 138 lbs 13 oz 139 lbs 3 oz   BMI 08.67 61.95   Systolic 093 267 124  Diastolic 47 46 61  Pulse 86 73 90     Laboratory Data    Latest Ref Rng & Units 12/10/2021   12:17 PM 11/26/2021   11:52 AM 10/10/2021   11:22 AM  CBC EXTENDED  WBC 4.0 - 10.5 K/uL 3.6  3.1  4.8   RBC 3.87 - 5.11 MIL/uL 3.58  3.62  3.90   Hemoglobin 12.0 - 15.0 g/dL 11.8  11.9  12.6   HCT 36.0 - 46.0 % 35.1  35.6  38.4   Platelets 150 - 400 K/uL 232  192  226   NEUT# 1.7 - 7.7 K/uL 2.1  2.0    Lymph# 0.7 - 4.0 K/uL 0.9  0.7         Latest Ref Rng & Units 11/26/2021   11:52 AM 10/10/2021   11:22 AM 07/10/2021   10:08 AM  CMP  Glucose 70 - 99 mg/dL 95  101  94   BUN 8 - 23 mg/dL '10  12  15   '$ Creatinine 0.44 -  1.00 mg/dL 1.09  0.83  0.79   Sodium 135 - 145 mmol/L 140  141  141   Potassium 3.5 - 5.1 mmol/L 3.9  3.8  4.2   Chloride 98 - 111 mmol/L 107  106  106   CO2 22 - 32 mmol/L '29  28  29   '$ Calcium 8.9 - 10.3 mg/dL 10.0  9.5  9.8   Total Protein 6.5 - 8.1 g/dL 7.2   7.2   Total Bilirubin 0.3 - 1.2 mg/dL 0.6   0.6   Alkaline Phos 38 - 126 U/L 75   68   AST 15 - 41 U/L 15   14   ALT 0 - 44 U/L 10   10     No results found for: "MG"  Adverse Effects Assessment Shortness of breath: Has been off abemaciclib for approximately 2 weeks.  She does feel that the symptoms have improved.  Dr. Lindi Adie did review the CT chest that was ordered and said that it was non conclusive and to do another scan in 6 weeks.  Those orders will be entered today.  Adherence Assessment Melissa Mcgrath reports missing 0 doses over the past 2 weeks due to adherence.  As above, she has been holding abemaciclib for 2 weeks. Reason for missed dose: Dose was  held due to shortness of breath and to rule out pneumonitis. Patient was re-educated on importance of adherence.   Access Assessment LAUREEN FREDERIC is currently receiving her abemaciclib through [facility]. Insurance concerns: None  Medication Reconciliation The patient's medication list was reviewed today with the patient?  Yes New medications or herbal supplements have recently been started?  No Any medications have been discontinued?  No The medication list was updated and reconciled based on the patient's most recent medication list in the electronic medical record (EMR) including herbal products and OTC medications.   Medications Current Outpatient Medications  Medication Sig Dispense Refill   abemaciclib (VERZENIO) 50 MG tablet Take 1 tablet (50 mg total) by mouth 2 (two) times daily. Swallow tablets whole. Do not chew, crush, or split tablets before swallowing. 56 tablet 0   acetaminophen (TYLENOL) 650 MG CR tablet Take 650 mg by mouth every 8 (eight) hours as needed for pain.     letrozole (FEMARA) 2.5 MG tablet Take 1 tablet (2.5 mg total) by mouth daily. 90 tablet 3   ondansetron (ZOFRAN) 8 MG tablet Take 1 tablet (8 mg total) by mouth every 8 (eight) hours as needed for nausea or vomiting. (Patient not taking: Reported on 11/26/2021) 30 tablet 1   prochlorperazine (COMPAZINE) 10 MG tablet Take 1 tablet (10 mg total) by mouth every 6 (six) hours as needed for nausea or vomiting. (Patient not taking: Reported on 11/26/2021) 30 tablet 2   valACYclovir (VALTREX) 500 MG tablet Take 500 mg by mouth 2 (two) times daily as needed (for fever bilsters outbreak). (Patient not taking: Reported on 11/26/2021)     No current facility-administered medications for this visit.    Drug-Drug Interactions (DDIs) DDIs were evaluated?  Yes Significant DDIs?  No The patient was instructed to speak with their health care provider and/or the oral chemotherapy pharmacist before  starting any new drug, including prescription or over the counter, natural / herbal products, or vitamins.  Supportive Care Diarrhea: we reviewed that diarrhea is common with abemaciclib and confirmed that she does have loperamide (Imodium) at home.  We reviewed how to take this medication PRN Neutropenia:  we discussed the importance of having a thermometer and what the Centers for Disease Control and Prevention (CDC) considers a fever which is 100.59F (38C) or higher.  Gave patient 24/7 triage line to call if any fevers or symptoms ILD/Pneumonitis: we reviewed potential symptoms including cough, shortness, and fatigue.  She will continue to monitor for the symptoms and if they increase, she knows to stop abemaciclib and let the clinic know. VTE: reviewed signs of DVT such as leg swelling, redness, pain, or tenderness and signs of PE such as shortness of breath, rapid or irregular heartbeat, cough, chest pain, or lightheadedness  Dosing Assessment Hepatic adjustments needed?  No Renal adjustments needed?  No Toxicity adjustments needed?  No, she is on the lowest dose of abemaciclib.  She is not interested in increasing the dose at this time. The current dosing regimen is appropriate to continue at this time.  Follow-Up Plan Restart abemaciclib 50 mg every 12 hours starting tomorrow.  Ms. Rowe knows to stop abemaciclib if the shortness of breath recurs and to contact the clinic should this happen. CT chest without contrast will be ordered for 6 weeks time per Dr. Geralyn Flash recommendation.  Last exam was not conclusive for pneumonitis or Dr. Lindi Adie Continue letrozole 2.5 mg daily Labs, Thedore Mins visit, on 12/27/21 Labs, pharmacy clinic visit, on 01/07/22 Labs, Dr. Lindi Adie visit, on 02/11/22.  If tolerating everything well at this point, she can consider spacing out labs less frequently per manufacturing guidelines.  She is not interested in going up on the dose at this time.  Melissa Mcgrath participated in the discussion, expressed understanding, and voiced agreement with the above plan. All questions were answered to her satisfaction. The patient was advised to contact the clinic at [phone] with any questions or concerns prior to her return visit.   I spent 30 minutes assessing and educating the patient.  Raina Mina, RPH-CPP, 12/10/2021  12:31 PM   **Disclaimer: This note was dictated with voice recognition software. Similar sounding words can inadvertently be transcribed and this note may contain transcription errors which may not have been corrected upon publication of note.**

## 2021-12-12 ENCOUNTER — Other Ambulatory Visit (HOSPITAL_COMMUNITY): Payer: Self-pay

## 2021-12-19 ENCOUNTER — Other Ambulatory Visit (HOSPITAL_COMMUNITY): Payer: Self-pay

## 2021-12-26 ENCOUNTER — Other Ambulatory Visit (HOSPITAL_COMMUNITY): Payer: Self-pay

## 2021-12-27 ENCOUNTER — Inpatient Hospital Stay: Payer: No Typology Code available for payment source

## 2021-12-27 ENCOUNTER — Other Ambulatory Visit: Payer: Self-pay

## 2021-12-27 ENCOUNTER — Inpatient Hospital Stay: Payer: No Typology Code available for payment source | Attending: Adult Health | Admitting: Adult Health

## 2021-12-27 ENCOUNTER — Encounter: Payer: Self-pay | Admitting: Adult Health

## 2021-12-27 ENCOUNTER — Telehealth: Payer: Self-pay | Admitting: Surgery

## 2021-12-27 VITALS — BP 110/56 | HR 64 | Temp 97.7°F | Wt 135.7 lb

## 2021-12-27 DIAGNOSIS — Z170 Estrogen receptor positive status [ER+]: Secondary | ICD-10-CM | POA: Diagnosis not present

## 2021-12-27 DIAGNOSIS — C50411 Malignant neoplasm of upper-outer quadrant of right female breast: Secondary | ICD-10-CM | POA: Diagnosis not present

## 2021-12-27 DIAGNOSIS — Z9221 Personal history of antineoplastic chemotherapy: Secondary | ICD-10-CM | POA: Diagnosis not present

## 2021-12-27 DIAGNOSIS — R0609 Other forms of dyspnea: Secondary | ICD-10-CM | POA: Insufficient documentation

## 2021-12-27 DIAGNOSIS — Z923 Personal history of irradiation: Secondary | ICD-10-CM | POA: Diagnosis not present

## 2021-12-27 DIAGNOSIS — R5383 Other fatigue: Secondary | ICD-10-CM | POA: Insufficient documentation

## 2021-12-27 LAB — CBC WITH DIFFERENTIAL (CANCER CENTER ONLY)
Abs Immature Granulocytes: 0.01 10*3/uL (ref 0.00–0.07)
Basophils Absolute: 0.1 10*3/uL (ref 0.0–0.1)
Basophils Relative: 2 %
Eosinophils Absolute: 0.1 10*3/uL (ref 0.0–0.5)
Eosinophils Relative: 4 %
HCT: 36 % (ref 36.0–46.0)
Hemoglobin: 11.9 g/dL — ABNORMAL LOW (ref 12.0–15.0)
Immature Granulocytes: 0 %
Lymphocytes Relative: 24 %
Lymphs Abs: 0.8 10*3/uL (ref 0.7–4.0)
MCH: 32.6 pg (ref 26.0–34.0)
MCHC: 33.1 g/dL (ref 30.0–36.0)
MCV: 98.6 fL (ref 80.0–100.0)
Monocytes Absolute: 0.3 10*3/uL (ref 0.1–1.0)
Monocytes Relative: 11 %
Neutro Abs: 1.9 10*3/uL (ref 1.7–7.7)
Neutrophils Relative %: 59 %
Platelet Count: 195 10*3/uL (ref 150–400)
RBC: 3.65 MIL/uL — ABNORMAL LOW (ref 3.87–5.11)
RDW: 13.1 % (ref 11.5–15.5)
WBC Count: 3.2 10*3/uL — ABNORMAL LOW (ref 4.0–10.5)
nRBC: 0 % (ref 0.0–0.2)

## 2021-12-27 LAB — CMP (CANCER CENTER ONLY)
ALT: 11 U/L (ref 0–44)
AST: 16 U/L (ref 15–41)
Albumin: 4.3 g/dL (ref 3.5–5.0)
Alkaline Phosphatase: 80 U/L (ref 38–126)
Anion gap: 5 (ref 5–15)
BUN: 13 mg/dL (ref 8–23)
CO2: 29 mmol/L (ref 22–32)
Calcium: 9.7 mg/dL (ref 8.9–10.3)
Chloride: 106 mmol/L (ref 98–111)
Creatinine: 1.09 mg/dL — ABNORMAL HIGH (ref 0.44–1.00)
GFR, Estimated: 58 mL/min — ABNORMAL LOW (ref >60)
Glucose, Bld: 100 mg/dL — ABNORMAL HIGH (ref 70–99)
Potassium: 3.7 mmol/L (ref 3.5–5.1)
Sodium: 140 mmol/L (ref 135–145)
Total Bilirubin: 0.6 mg/dL (ref 0.3–1.2)
Total Protein: 7.6 g/dL (ref 6.5–8.1)

## 2021-12-27 LAB — SAVE SMEAR(SSMR), FOR PROVIDER SLIDE REVIEW

## 2021-12-27 MED ORDER — TRIAMCINOLONE ACETONIDE 0.025 % EX OINT: 1.0000 | TOPICAL_OINTMENT | Freq: Two times a day (BID) | CUTANEOUS | 0 refills | Status: DC

## 2021-12-27 NOTE — Telephone Encounter (Signed)
I called the pt to let her know that her chest CT is scheduled for July 25 at 10:00 am here at [facility].  She is to arrive around 9:30 am.  Pt verbalized understanding and was told to call our office back if she needed to reschedule the time.  I also reminded the pt of her upcoming phone visit with Dr. Lindi Adie on July 27 at 11:30 am.

## 2021-12-27 NOTE — Assessment & Plan Note (Signed)
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  Her labs are stable.  She is going to continue with the current dose.  I reviewed with her that what she is doing in regards to energy conservation is correct and she should continue with that approach  We discussed that she has CT chest scheduled in July due to previous CT in June concerning for bandlike postinflammatory scarring in the apex.  I reassured her that the CT chest will help review how this process is doing as it could be consistent with radiation changes.  She will see Melissa Mcgrath in pharmacy clinic on July 18 and she will see Dr. Lindi Mcgrath on July 27 after her CT chest.  She will have lab and follow-up with Melissa Mcgrath and pharmacy clinic on July 18.

## 2021-12-27 NOTE — Progress Notes (Unsigned)
Pt is taking Femara as described with no symptoms.  However, the pt is having nausea after she takes the [employer].

## 2021-12-27 NOTE — Progress Notes (Signed)
[facility] Follow up:    Melissa Lawrence, NP [address] [place] [address]   DIAGNOSIS: Cancer Staging  Malignant neoplasm of upper-outer quadrant of right breast in female, estrogen receptor positive ([place]) Staging form: Breast, AJCC 8th Edition - Clinical stage from 08/29/2020: Stage IIIA (cT2, cN2, cM0, G3, ER+, PR+, HER2-) - Signed by Gardenia Phlegm, NP on 09/07/2020 Stage prefix: Initial diagnosis Stage used in treatment planning: Yes National guidelines used in treatment planning: Yes Type of national guideline used in treatment planning: NCCN   SUMMARY OF ONCOLOGIC HISTORY: Oncology History  Malignant neoplasm of upper-outer quadrant of right breast in female, estrogen receptor positive ([place])  08/21/2020 Initial Diagnosis   right breast upper outer quadrant biopsy 08/21/2020 for a clinical mT2 N1, stage IIA/B invasive ductal carcinoma, grade 3, estrogen receptor positive, progesterone receptor weakly positive, HER-2 not amplified, with an MIB-1 of 40%   08/29/2020 Cancer Staging   Staging form: Breast, AJCC 8th Edition - Clinical stage from 08/29/2020: Stage IIIA (cT2, cN2, cM0, G3, ER+, PR+, HER2-) - Signed by Gardenia Phlegm, NP on 09/07/2020 Stage prefix: Initial diagnosis Stage used in treatment planning: Yes National guidelines used in treatment planning: Yes Type of national guideline used in treatment planning: NCCN   09/18/2020 - 03/01/2021 Chemotherapy   AC X 4 foll by Taxol X 12    09/18/2020 Genetic Testing   Negative genetic testing:  No pathogenic variants detected on the [facility] CancerNext-Expanded + RNAinsight panel. The report date is 09/18/2020.   The CancerNext-Expanded + RNAinsight gene panel offered by [employer] and includes sequencing and rearrangement analysis for the following 77 genes: AIP, ALK, APC, ATM, AXIN2, BAP1, BARD1, BLM, BMPR1A, BRCA1, BRCA2, BRIP1, CDC73, CDH1, CDK4, CDKN1B, CDKN2A, CHEK2,  CTNNA1, DICER1, FANCC, FH, FLCN, GALNT12, KIF1B, LZTR1, MAX, MEN1, MET, MLH1, MSH2, MSH3, MSH6, MUTYH, NBN, NF1, NF2, NTHL1, PALB2, PHOX2B, PMS2, POT1, PRKAR1A, PTCH1, PTEN, RAD51C, RAD51D, RB1, RECQL, RET, SDHA, SDHAF2, SDHB, SDHC, SDHD, SMAD4, SMARCA4, SMARCB1, SMARCE1, STK11, SUFU, TMEM127, TP53, TSC1, TSC2, VHL and XRCC2 (sequencing and deletion/duplication); EGFR, EGLN1, HOXB13, KIT, MITF, PDGFRA, POLD1 and POLE (sequencing only); EPCAM and GREM1 (deletion/duplication only). RNA data is routinely analyzed for use in variant interpretation for all genes.    Surgery   bilateral mastectomies with right axillary lymph nodes dissection on 04/25/2021 showing (A) on the left, no evidence of malignancy (B) on the right a residual ypT1c ypN0 invasive ductal carcinoma, grade 3, with a focally positive deep margin a total of 24 right axillary and 2 right intramammary lymph nodes were removed, all clear   06/12/2021 - 07/24/2021 Radiation Therapy   Adj XRT     CURRENT THERAPY: letrozole/verzenio  INTERVAL HISTORY: Melissa Mcgrath 61 y.o. female returns for    Patient Active Problem List   Diagnosis Date Noted   Port-A-Cath in place 10/04/2020   Genetic testing 09/18/2020   History of augmentation mammoplasty 08/31/2020   Family history of breast cancer    Family history of pancreatic cancer    Family history of melanoma    Family history of ovarian cancer    Malignant neoplasm of upper-outer quadrant of right breast in female, estrogen receptor positive ([facility]) 08/28/2020   Vaginal atrophy 11/09/2014   Menopause 01/05/2014    is allergic to peanut-containing drug products.  MEDICAL HISTORY: Past Medical History:  Diagnosis Date   Anemia    Depression 01/05/2014   Facial basal cell cancer  Family history of breast cancer    Family history of melanoma    Family history of ovarian cancer    Family history of pancreatic cancer    History of radiation therapy    right chest wall and  subclavian 06/11/2021-07/26/2021  Dr Gery Pray   Menopause 01/05/2014   Vaginal atrophy 11/09/2014    SURGICAL HISTORY: Past Surgical History:  Procedure Laterality Date   BILATERAL TOTAL MASTECTOMY WITH AXILLARY LYMPH NODE DISSECTION Right 04/25/2021   Procedure: RIGHT TOTAL MASTECTOMY WITH RIGHT AXILLARY LYMPH NODE DISSECTION;  Surgeon: Stark Klein, MD;  Location: [facility];  Service: General;  Laterality: Right;   BREAST ENHANCEMENT SURGERY     BREAST IMPLANT REMOVAL Bilateral 04/25/2021   Procedure: REMOVAL BREAST IMPLANTS;  Surgeon: Stark Klein, MD;  Location: [facility];  Service: General;  Laterality: Bilateral;   CARPAL TUNNEL RELEASE Right    PORT-A-CATH REMOVAL N/A 10/15/2021   Procedure: REMOVAL PORT-A-CATH;  Surgeon: Stark Klein, MD;  Location: [facility];  Service: General;  Laterality: N/A;   PORTACATH PLACEMENT N/A 09/17/2020   Procedure: INSERTION PORT-A-CATH;  Surgeon: Stark Klein, MD;  Location: [facility];  Service: General;  Laterality: N/A;   refractive lensectomy Bilateral    SCAR REVISION Left 10/15/2021   Procedure: REVISION OF LEFT MASTECTOMY SCAR;  Surgeon: Stark Klein, MD;  Location: [facility];  Service: General;  Laterality: Left;   TOTAL MASTECTOMY Left 04/25/2021   Procedure: LEFT TOTAL MASTECTOMY;  Surgeon: Stark Klein, MD;  Location: [facility];  Service: General;  Laterality: Left;    SOCIAL HISTORY: Social History   Socioeconomic History   Marital status: Married    Spouse name: Not on file   Number of children: Not on file   Years of education: Not on file   Highest education level: Not on file  Occupational History   Not on file  Tobacco Use   Smoking status: Never   Smokeless tobacco: Never  Vaping Use   Vaping Use: Never used  Substance and Sexual Activity   Alcohol use: No   Drug use: No   Sexual activity: Not Currently    Birth control/protection: Post-menopausal  Other Topics Concern   Not on file  Social History Narrative    Married 14 years,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.   Social Determinants of Health   Financial Resource Strain: Low Risk  (08/29/2020)   Overall Financial Resource Strain (CARDIA)    Difficulty of Paying Living Expenses: Not very hard  Food Insecurity: No Food Insecurity (08/29/2020)   Hunger Vital Sign    Worried About Running Out of Food in the Last Year: Never true    Ran Out of Food in the Last Year: Never true  Transportation Needs: No Transportation Needs (08/29/2020)   PRAPARE - Hydrologist (Medical): No    Lack of Transportation (Non-Medical): No  Physical Activity: Not on file  Stress: Not on file  Social Connections: Not on file  Intimate Partner Violence: Not on file    FAMILY HISTORY: Family History  Problem Relation Age of Onset   Breast cancer Mother 68       breast   Hypertension Father    Heart attack Father    Alzheimer's disease Father    Fibromyalgia Sister    Diabetes Sister    Heart attack Sister    Hypertension Sister    Hypertension Brother    Diabetes Brother    Pancreatic cancer Maternal  Grandmother 35       Pancreatic   Melanoma Paternal Uncle        dx 30s   Melanoma Paternal Uncle        dx 57s   Ovarian cancer Paternal Aunt        dx 50s/60s    Review of Systems - Oncology    PHYSICAL EXAMINATION  ECOG PERFORMANCE STATUS: {CHL ONC ECOG YF:[id number]}  Vitals:   12/27/21 1209  BP: (!) 110/56  Pulse: 64  Temp: 97.7 F (36.5 C)  SpO2: 100%    Physical Exam  LABORATORY DATA:  CBC    Component Value Date/Time   WBC 3.2 (L) 12/27/2021 1141   WBC 4.8 10/10/2021 1122   RBC 3.65 (L) 12/27/2021 1141   HGB 11.9 (L) 12/27/2021 1141   HCT 36.0 12/27/2021 1141   PLT 195 12/27/2021 1141   MCV 98.6 12/27/2021 1141   MCH 32.6 12/27/2021 1141   MCHC 33.1 12/27/2021 1141   RDW 13.1 12/27/2021 1141   LYMPHSABS 0.8 12/27/2021 1141   MONOABS 0.3 12/27/2021 1141   EOSABS 0.1 12/27/2021 1141    BASOSABS 0.1 12/27/2021 1141    CMP     Component Value Date/Time   NA 140 12/27/2021 1141   K 3.7 12/27/2021 1141   CL 106 12/27/2021 1141   CO2 29 12/27/2021 1141   GLUCOSE 100 (H) 12/27/2021 1141   BUN 13 12/27/2021 1141   CREATININE 1.09 (H) 12/27/2021 1141   CREATININE 0.76 12/20/2013 1207   CALCIUM 9.7 12/27/2021 1141   PROT 7.6 12/27/2021 1141   ALBUMIN 4.3 12/27/2021 1141   AST 16 12/27/2021 1141   ALT 11 12/27/2021 1141   ALKPHOS 80 12/27/2021 1141   BILITOT 0.6 12/27/2021 1141   GFRNONAA 58 (L) 12/27/2021 1141       PENDING LABS:   RADIOGRAPHIC STUDIES:  No results found.   PATHOLOGY:     ASSESSMENT and THERAPY PLAN:   No problem-specific Assessment & Plan notes found for this encounter.   No orders of the defined types were placed in this encounter.   All questions were answered. The patient knows to call the clinic with any problems, questions or concerns. We can certainly see the patient much sooner if necessary. This note was electronically signed. Scot Dock, NP 12/27/2021

## 2022-01-07 ENCOUNTER — Inpatient Hospital Stay: Payer: No Typology Code available for payment source

## 2022-01-07 ENCOUNTER — Inpatient Hospital Stay: Payer: No Typology Code available for payment source | Admitting: Pharmacist

## 2022-01-07 VITALS — BP 100/40 | HR 65 | Temp 97.5°F | Resp 18 | Ht 68.0 in | Wt 136.7 lb

## 2022-01-07 DIAGNOSIS — C50411 Malignant neoplasm of upper-outer quadrant of right female breast: Secondary | ICD-10-CM | POA: Diagnosis not present

## 2022-01-07 DIAGNOSIS — Z170 Estrogen receptor positive status [ER+]: Secondary | ICD-10-CM

## 2022-01-07 LAB — CMP (CANCER CENTER ONLY)
ALT: 10 U/L (ref 0–44)
AST: 14 U/L — ABNORMAL LOW (ref 15–41)
Albumin: 4.1 g/dL (ref 3.5–5.0)
Alkaline Phosphatase: 78 U/L (ref 38–126)
Anion gap: 9 (ref 5–15)
BUN: 10 mg/dL (ref 8–23)
CO2: 26 mmol/L (ref 22–32)
Calcium: 9.7 mg/dL (ref 8.9–10.3)
Chloride: 108 mmol/L (ref 98–111)
Creatinine: 1.15 mg/dL — ABNORMAL HIGH (ref 0.44–1.00)
GFR, Estimated: 54 mL/min — ABNORMAL LOW (ref >60)
Glucose, Bld: 106 mg/dL — ABNORMAL HIGH (ref 70–99)
Potassium: 3.6 mmol/L (ref 3.5–5.1)
Sodium: 143 mmol/L (ref 135–145)
Total Bilirubin: 0.6 mg/dL (ref 0.3–1.2)
Total Protein: 7 g/dL (ref 6.5–8.1)

## 2022-01-07 LAB — CBC WITH DIFFERENTIAL (CANCER CENTER ONLY)
Abs Immature Granulocytes: 0.01 10*3/uL (ref 0.00–0.07)
Basophils Absolute: 0.1 10*3/uL (ref 0.0–0.1)
Basophils Relative: 1 %
Eosinophils Absolute: 0.1 10*3/uL (ref 0.0–0.5)
Eosinophils Relative: 3 %
HCT: 33.2 % — ABNORMAL LOW (ref 36.0–46.0)
Hemoglobin: 11.5 g/dL — ABNORMAL LOW (ref 12.0–15.0)
Immature Granulocytes: 0 %
Lymphocytes Relative: 27 %
Lymphs Abs: 0.9 10*3/uL (ref 0.7–4.0)
MCH: 33.7 pg (ref 26.0–34.0)
MCHC: 34.6 g/dL (ref 30.0–36.0)
MCV: 97.4 fL (ref 80.0–100.0)
Monocytes Absolute: 0.3 10*3/uL (ref 0.1–1.0)
Monocytes Relative: 9 %
Neutro Abs: 2.1 10*3/uL (ref 1.7–7.7)
Neutrophils Relative %: 60 %
Platelet Count: 199 10*3/uL (ref 150–400)
RBC: 3.41 MIL/uL — ABNORMAL LOW (ref 3.87–5.11)
RDW: 13.2 % (ref 11.5–15.5)
WBC Count: 3.5 10*3/uL — ABNORMAL LOW (ref 4.0–10.5)
nRBC: 0 % (ref 0.0–0.2)

## 2022-01-07 LAB — SAVE SMEAR(SSMR), FOR PROVIDER SLIDE REVIEW

## 2022-01-07 NOTE — Progress Notes (Signed)
Melissa Mcgrath       Telephone: [phone]?Fax: [phone]   Oncology Clinical Pharmacist Practitioner Progress Note  Melissa Mcgrath was contacted via in-person to discuss her chemotherapy regimen for abemaciclib which they receive under the care of Dr. Nicholas Lose.   Current treatment regimen and start date Abemaciclib (11/09/21) Letrozole (07/10/21)   Interval History She continues  on abemaciclib 50 mg by mouth every 12 hours on days 1 to 28 of a 28-day cycle. This is being given  in combination with letrozole . Therapy is planned to continue until  2 years in the adjuvant setting per the MonarchE trial data .   Response to Therapy Ms. Duzan was seen today by clinical pharmacy as a follow-up to her abemaciclib management.  She was last seen by Thedore Mins NP on 12/27/21 and clinical pharmacy on 12/10/21.  Overall, she is doing well.  She states that her difficulty breathing has stabilized and is not getting any worse.  She has a follow-up CT chest scheduled for 01/14/22 and then a follow-up telephone encounter with Dr. Lindi Adie on 01/16/22 we will see her again on 02/04/22.  She has been on abemaciclib since 11/09/21 and after her September visit, if she is tolerating abemaciclib well, she can likely have labs periodically or as clinically indicated per the manufacturing guidelines.  She does describe some cyclical nausea which she is having every morning.  We did discuss that she could schedule her ondansetron antiemetic prior to when she normally starts to get nauseous.  She will try this and she knows to space this out every 8 hours as needed and she can also use prochlorperazine every 6 hours as needed.  The only other side effect that she reports is some mild diarrhea which is being controlled by loperamide.  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 Labs, vitals, treatment  parameters, and manufacturer guidelines assessing toxicity were reviewed with Melissa Mcgrath today. Based on these values, patient is in agreement to continue abemaciclib therapy at this time.  Allergies Allergies  Allergen Reactions   Peanut-Containing Drug Products Nausea And Vomiting    Vitals    01/07/2022    2:20 PM 12/27/2021   12:09 PM 12/10/2021   12:34 PM  Vitals with BMI  Height '5\' 8"'$   '5\' 8"'$   Weight 136 lbs 11 oz 135 lbs 11 oz 139 lbs 6 oz  BMI 20.79 37.10 62.6  Systolic 948 546 99  Diastolic 40 56 56  Pulse 65 64 70     Laboratory Data    Latest Ref Rng & Units 01/07/2022    1:44 PM 12/27/2021   11:41 AM 12/10/2021   12:17 PM  CBC EXTENDED  WBC 4.0 - 10.5 K/uL 3.5  3.2  3.6   RBC 3.87 - 5.11 MIL/uL 3.41  3.65  3.58   Hemoglobin 12.0 - 15.0 g/dL 11.5  11.9  11.8   HCT 36.0 - 46.0 % 33.2  36.0  35.1   Platelets 150 - 400 K/uL 199  195  232   NEUT# 1.7 - 7.7 K/uL 2.1  1.9  2.1   Lymph# 0.7 - 4.0 K/uL 0.9  0.8  0.9        Latest Ref Rng & Units 01/07/2022    1:44 PM 12/27/2021   11:41 AM 12/10/2021   12:17 PM  CMP  Glucose 70 - 99 mg/dL 106  100  93   BUN 8 - 23 mg/dL '10  13  18   '$ Creatinine 0.44 - 1.00 mg/dL 1.15  1.09  0.92   Sodium 135 - 145 mmol/L 143  140  142   Potassium 3.5 - 5.1 mmol/L 3.6  3.7  4.0   Chloride 98 - 111 mmol/L 108  106  107   CO2 22 - 32 mmol/L '26  29  30   '$ Calcium 8.9 - 10.3 mg/dL 9.7  9.7  9.6   Total Protein 6.5 - 8.1 g/dL 7.0  7.6  7.3   Total Bilirubin 0.3 - 1.2 mg/dL 0.6  0.6  0.4   Alkaline Phos 38 - 126 U/L 78  80  77   AST 15 - 41 U/L '14  16  15   '$ ALT 0 - 44 U/L '10  11  9     '$ No results found for: "MG"  Adverse Effects Assessment Shortness of breath: Improved.  She has a repeat CT chest on 01/14/22. Nausea: Appears to be cyclical in nature happening every morning.  We discussed scheduling ondansetron prior to when she normally gets nauseous. Diarrhea: Mild in nature and controlled with loperamide per Ms. Hegler's  report.  Adherence Assessment Melissa Mcgrath reports missing 2 doses over the past 4 weeks.   Reason for missed dose: Was on vacation and did not want to have side effects from the abemaciclib. Patient was re-educated on importance of adherence.   Access Assessment CARTIER MAPEL is currently receiving her abemaciclib through [facility] concerns: None  Medication Reconciliation The patient's medication list was reviewed today with the patient?  Yes New medications or herbal supplements have recently been started?  No Any medications have been discontinued?  No The medication list was updated and reconciled based on the patient's most recent medication list in the electronic medical record (EMR) including herbal products and OTC medications.   Medications Current Outpatient Medications  Medication Sig Dispense Refill   abemaciclib (VERZENIO) 50 MG tablet Take 1 tablet (50 mg total) by mouth 2 (two) times daily. Swallow tablets whole. Do not chew, crush, or split tablets before swallowing. 56 tablet 0   acetaminophen (TYLENOL) 650 MG CR tablet Take 650 mg by mouth every 8 (eight) hours as needed for pain.     letrozole (FEMARA) 2.5 MG tablet Take 1 tablet (2.5 mg total) by mouth daily. 90 tablet 3   ondansetron (ZOFRAN) 8 MG tablet Take 1 tablet (8 mg total) by mouth every 8 (eight) hours as needed for nausea or vomiting. (Patient not taking: Reported on 11/26/2021) 30 tablet 1   prochlorperazine (COMPAZINE) 10 MG tablet Take 1 tablet (10 mg total) by mouth every 6 (six) hours as needed for nausea or vomiting. (Patient not taking: Reported on 11/26/2021) 30 tablet 2   triamcinolone (KENALOG) 0.025 % ointment Apply 1 Application topically 2 (two) times daily. 30 g 0   valACYclovir (VALTREX) 500 MG tablet Take 500 mg by mouth 2 (two) times daily as needed (for fever bilsters outbreak). (Patient not taking: Reported on 11/26/2021)     No current  facility-administered medications for this visit.    Drug-Drug Interactions (DDIs) DDIs were evaluated?  Yes Significant DDIs?  No The patient was instructed to speak with their health care provider and/or the oral chemotherapy pharmacist before starting any new drug, including prescription or over the counter, natural / herbal  products, or vitamins.  Supportive Care Diarrhea: we reviewed that diarrhea is common with abemaciclib and confirmed that she does have loperamide (Imodium) at home.  We reviewed how to take this medication PRN Neutropenia: we discussed the importance of having a thermometer and what the Centers for Disease Control and Prevention (CDC) considers a fever which is 100.91F (38C) or higher.  Gave patient 24/7 triage line to call if any fevers or symptoms ILD/Pneumonitis: we reviewed potential symptoms including cough, shortness, and fatigue.  VTE: reviewed signs of DVT such as leg swelling, redness, pain, or tenderness and signs of PE such as shortness of breath, rapid or irregular heartbeat, cough, chest pain, or lightheadedness Reviewed to take the medication every 12 hours (with food sometimes can be easier on the stomach) and to take it at the same time every day.   Dosing Assessment Hepatic adjustments needed?  No Renal adjustments needed?  No Toxicity adjustments needed?  No The current dosing regimen is appropriate to continue at this time.  Follow-Up Plan Continue abemaciclib 50 mg every 12 hours by mouth Continue letrozole 2.5 mg daily by mouth Consider scheduling ondansetron due to cyclical nausea Continue to monitor for worsening shortness of breath.  Has improved from last visit and has repeat CT scan on 01/14/22 and a follow-up telephone encounter with Dr. Lindi Adie on 01/16/22 Continue to monitor Scr. Encouraged increased fluid intake especially since works outside a lot and very active. Will add labs, pharmacy clinic visit, on 02/04/22  Melissa Mcgrath  participated in the discussion, expressed understanding, and voiced agreement with the above plan. All questions were answered to her satisfaction. The patient was advised to contact the clinic at [phone] with any questions or concerns prior to her return visit.   I spent 30 minutes assessing and educating the patient.  Raina Mina, RPH-CPP, 01/07/2022  2:39 PM   **Disclaimer: This note was dictated with voice recognition software. Similar sounding words can inadvertently be transcribed and this note may contain transcription errors which may not have been corrected upon publication of note.**

## 2022-01-09 ENCOUNTER — Other Ambulatory Visit (HOSPITAL_COMMUNITY): Payer: Self-pay

## 2022-01-13 ENCOUNTER — Other Ambulatory Visit: Payer: Self-pay | Admitting: Hematology and Oncology

## 2022-01-13 ENCOUNTER — Other Ambulatory Visit (HOSPITAL_COMMUNITY): Payer: Self-pay

## 2022-01-13 MED ORDER — ABEMACICLIB 50 MG PO TABS
50.0000 mg | ORAL_TABLET | Freq: Two times a day (BID) | ORAL | 5 refills | Status: DC
  Filled 2022-01-13: qty 56, 28d supply, fill #0
  Filled 2022-02-06: qty 56, 28d supply, fill #1

## 2022-01-13 NOTE — Telephone Encounter (Signed)
Hi Melissa Mcgrath.  It looks like you saw Melissa Mcgrath last. Can you please review and refill if needed.

## 2022-01-14 ENCOUNTER — Other Ambulatory Visit (HOSPITAL_COMMUNITY): Payer: Self-pay

## 2022-01-14 ENCOUNTER — Ambulatory Visit (HOSPITAL_COMMUNITY)
Admission: RE | Admit: 2022-01-14 | Discharge: 2022-01-14 | Disposition: A | Discharge status: Home / Self Care | Payer: No Typology Code available for payment source | Source: Ambulatory Visit | Attending: Hematology and Oncology | Admitting: Hematology and Oncology

## 2022-01-14 DIAGNOSIS — Z170 Estrogen receptor positive status [ER+]: Secondary | ICD-10-CM | POA: Insufficient documentation

## 2022-01-14 DIAGNOSIS — C50411 Malignant neoplasm of upper-outer quadrant of right female breast: Secondary | ICD-10-CM | POA: Insufficient documentation

## 2022-01-15 NOTE — Progress Notes (Unsigned)
HEMATOLOGY-ONCOLOGY TELEPHONE VISIT PROGRESS NOTE  I connected with our patient on 01/16/22 at 11:30 AM EDT by telephone and verified that I am speaking with the correct person using two identifiers.  I discussed the limitations, risks, security and privacy concerns of performing an evaluation and management service by telephone and the availability of in person appointments.  I also discussed with the patient that there may be a patient responsible charge related to this service. The patient expressed understanding and agreed to proceed.   History of Present Illness:  Melissa Mcgrath is a 61 y.o. with above-mentioned history of estrogen receptor positive breast cancer. She presents to the clinic today for a telephone follow-up.  She gets intermittent diarrhea once or twice a week for which she takes Imodium.  [name] was a bad day where she had 4 loose stools and she felt dizzy and lightheaded for a day.  She has recovered from that.  She continues to have fatigue issues.  Oncology History  Malignant neoplasm of upper-outer quadrant of right breast in female, estrogen receptor positive ([facility])  08/21/2020 Initial Diagnosis   right breast upper outer quadrant biopsy 08/21/2020 for a clinical mT2 N1, stage IIA/B invasive ductal carcinoma, grade 3, estrogen receptor positive, progesterone receptor weakly positive, HER-2 not amplified, with an MIB-1 of 40%   08/29/2020 Cancer Staging   Staging form: Breast, AJCC 8th Edition - Clinical stage from 08/29/2020: Stage IIIA (cT2, cN2, cM0, G3, ER+, PR+, HER2-) - Signed by Gardenia Phlegm, NP on 09/07/2020 Stage prefix: Initial diagnosis Stage used in treatment planning: Yes National guidelines used in treatment planning: Yes Type of national guideline used in treatment planning: NCCN   09/18/2020 - 03/01/2021 Chemotherapy   AC X 4 foll by Taxol X 12    09/18/2020 Genetic Testing   Negative genetic testing:  No pathogenic variants detected on the  [facility] CancerNext-Expanded + RNAinsight panel. The report date is 09/18/2020.   The CancerNext-Expanded + RNAinsight gene panel offered by [employer] and includes sequencing and rearrangement analysis for the following 77 genes: AIP, ALK, APC, ATM, AXIN2, BAP1, BARD1, BLM, BMPR1A, BRCA1, BRCA2, BRIP1, CDC73, CDH1, CDK4, CDKN1B, CDKN2A, CHEK2, CTNNA1, DICER1, FANCC, FH, FLCN, GALNT12, KIF1B, LZTR1, MAX, MEN1, MET, MLH1, MSH2, MSH3, MSH6, MUTYH, NBN, NF1, NF2, NTHL1, PALB2, PHOX2B, PMS2, POT1, PRKAR1A, PTCH1, PTEN, RAD51C, RAD51D, RB1, RECQL, RET, SDHA, SDHAF2, SDHB, SDHC, SDHD, SMAD4, SMARCA4, SMARCB1, SMARCE1, STK11, SUFU, TMEM127, TP53, TSC1, TSC2, VHL and XRCC2 (sequencing and deletion/duplication); EGFR, EGLN1, HOXB13, KIT, MITF, PDGFRA, POLD1 and POLE (sequencing only); EPCAM and GREM1 (deletion/duplication only). RNA data is routinely analyzed for use in variant interpretation for all genes.    Surgery   bilateral mastectomies with right axillary lymph nodes dissection on 04/25/2021 showing (A) on the left, no evidence of malignancy (B) on the right a residual ypT1c ypN0 invasive ductal carcinoma, grade 3, with a focally positive deep margin a total of 24 right axillary and 2 right intramammary lymph nodes were removed, all clear   06/12/2021 - 07/24/2021 Radiation Therapy   Adj XRT   06/2021 -  Anti-estrogen oral therapy   Letrozole beginning 06/2021; Verzenio added 11/09/2021     REVIEW OF SYSTEMS:   Constitutional: Denies fevers, chills or abnormal weight loss All other systems were reviewed with the patient and are negative. Observations/Objective:     Assessment Plan:  Malignant neoplasm of upper-outer quadrant of right breast in female, estrogen receptor positive ([place] [place]) Right breast upper outer quadrant biopsy 08/21/2020 for  a clinical mT2 N1, stage IIA/B invasive ductal carcinoma, grade 3, ER 80%, PR 2%, HER-2 not amplified, with an MIB-1 of 40% T2N2 Stage 3A Neoadj chemo with  AC-T completed 10/26/20 Bil mastectomies 04/25/21: Rt: 1.6 cm, grade 3, Deep margin pos, Perineural inv present, 0/24 Axln, 0/2 Intra mamm LN Neg  Adj XRT completed 07/24/21   Treatment Plan: Adj Anti estrogen therapy with letrozole started 07/10/2021 and abemaciclib started 11/09/2021 Letrozole toxicities: No side effects   Verzinio toxicities:  Diarrhea: Trying to stay hydrated. [name] it was bad. Staying at 50 bid Takes Imodium Fatigue  01/14/2022: CT chest: Stable radiation changes, prior bilateral mastectomies, no findings of metastatic disease.  Return to clinic to follow-up with our pharmacy provider Jenny Reichmann in a couple of weeks.  After that she will be seen every 3 months with labs.    I discussed the assessment and treatment plan with the patient. The patient was provided an opportunity to ask questions and all were answered. The patient agreed with the plan and demonstrated an understanding of the instructions. The patient was advised to call back or seek an in-person evaluation if the symptoms worsen or if the condition fails to improve as anticipated.   I provided 12 minutes of non-face-to-face time during this encounter.  This includes time for charting and coordination of care   Harriette Ohara, MD   I Gardiner Coins am scribing for Dr. Lindi Adie  I have reviewed the above documentation for accuracy and completeness, and I agree with the above.

## 2022-01-16 ENCOUNTER — Inpatient Hospital Stay (HOSPITAL_BASED_OUTPATIENT_CLINIC_OR_DEPARTMENT_OTHER): Payer: No Typology Code available for payment source | Admitting: Hematology and Oncology

## 2022-01-16 DIAGNOSIS — C50411 Malignant neoplasm of upper-outer quadrant of right female breast: Secondary | ICD-10-CM

## 2022-01-16 DIAGNOSIS — Z170 Estrogen receptor positive status [ER+]: Secondary | ICD-10-CM

## 2022-01-16 NOTE — Assessment & Plan Note (Signed)
Right breast upper outer quadrant biopsy 08/21/2020 for a clinical mT2 N1, stage IIA/Binvasive ductal carcinoma, grade 3,ER 80%,PR 2%, HER-2 not amplified, with an MIB-1 of 40% T2N2 Stage 3A Neoadj chemo with AC-T completed 10/26/20 Bil mastectomies 04/25/21: Rt: 1.6 cm, grade 3, Deep margin pos, Perineural inv present, 0/24 Axln, 0/2 Intra mamm LN Neg  Adj XRT completed 07/24/21  Treatment Plan: Adj Anti estrogen therapy withletrozole started 07/10/2021 and abemaciclib started 11/09/2021 Letrozole toxicities:  Verzinio toxicities:  01/14/2022: CT chest: Stable radiation changes, prior bilateral mastectomies, no findings of metastatic disease.  Return to clinic

## 2022-01-27 ENCOUNTER — Other Ambulatory Visit (HOSPITAL_COMMUNITY): Payer: Self-pay

## 2022-02-01 ENCOUNTER — Other Ambulatory Visit (HOSPITAL_COMMUNITY): Payer: Self-pay

## 2022-02-04 ENCOUNTER — Inpatient Hospital Stay: Payer: No Typology Code available for payment source | Admitting: Pharmacist

## 2022-02-04 ENCOUNTER — Other Ambulatory Visit: Payer: Self-pay

## 2022-02-04 ENCOUNTER — Inpatient Hospital Stay: Payer: No Typology Code available for payment source | Attending: Adult Health

## 2022-02-04 VITALS — BP 111/52 | HR 76 | Temp 97.7°F | Resp 18 | Ht 68.0 in | Wt 136.6 lb

## 2022-02-04 DIAGNOSIS — Z170 Estrogen receptor positive status [ER+]: Secondary | ICD-10-CM

## 2022-02-04 DIAGNOSIS — Z79811 Long term (current) use of aromatase inhibitors: Secondary | ICD-10-CM | POA: Diagnosis not present

## 2022-02-04 DIAGNOSIS — C50411 Malignant neoplasm of upper-outer quadrant of right female breast: Secondary | ICD-10-CM | POA: Insufficient documentation

## 2022-02-04 DIAGNOSIS — I890 Lymphedema, not elsewhere classified: Secondary | ICD-10-CM | POA: Diagnosis not present

## 2022-02-04 LAB — CBC WITH DIFFERENTIAL (CANCER CENTER ONLY)
Abs Immature Granulocytes: 0.01 10*3/uL (ref 0.00–0.07)
Basophils Absolute: 0.1 10*3/uL (ref 0.0–0.1)
Basophils Relative: 2 %
Eosinophils Absolute: 0.1 10*3/uL (ref 0.0–0.5)
Eosinophils Relative: 3 %
HCT: 31.7 % — ABNORMAL LOW (ref 36.0–46.0)
Hemoglobin: 10.9 g/dL — ABNORMAL LOW (ref 12.0–15.0)
Immature Granulocytes: 0 %
Lymphocytes Relative: 26 %
Lymphs Abs: 0.9 10*3/uL (ref 0.7–4.0)
MCH: 33.6 pg (ref 26.0–34.0)
MCHC: 34.4 g/dL (ref 30.0–36.0)
MCV: 97.8 fL (ref 80.0–100.0)
Monocytes Absolute: 0.3 10*3/uL (ref 0.1–1.0)
Monocytes Relative: 9 %
Neutro Abs: 2 10*3/uL (ref 1.7–7.7)
Neutrophils Relative %: 60 %
Platelet Count: 202 10*3/uL (ref 150–400)
RBC: 3.24 MIL/uL — ABNORMAL LOW (ref 3.87–5.11)
RDW: 13.5 % (ref 11.5–15.5)
WBC Count: 3.4 10*3/uL — ABNORMAL LOW (ref 4.0–10.5)
nRBC: 0 % (ref 0.0–0.2)

## 2022-02-04 LAB — CMP (CANCER CENTER ONLY)
ALT: 14 U/L (ref 0–44)
AST: 17 U/L (ref 15–41)
Albumin: 3.9 g/dL (ref 3.5–5.0)
Alkaline Phosphatase: 69 U/L (ref 38–126)
Anion gap: 6 (ref 5–15)
BUN: 14 mg/dL (ref 8–23)
CO2: 27 mmol/L (ref 22–32)
Calcium: 9.4 mg/dL (ref 8.9–10.3)
Chloride: 110 mmol/L (ref 98–111)
Creatinine: 1.12 mg/dL — ABNORMAL HIGH (ref 0.44–1.00)
GFR, Estimated: 56 mL/min — ABNORMAL LOW (ref >60)
Glucose, Bld: 107 mg/dL — ABNORMAL HIGH (ref 70–99)
Potassium: 3.7 mmol/L (ref 3.5–5.1)
Sodium: 143 mmol/L (ref 135–145)
Total Bilirubin: 0.8 mg/dL (ref 0.3–1.2)
Total Protein: 7 g/dL (ref 6.5–8.1)

## 2022-02-04 LAB — SAVE SMEAR(SSMR), FOR PROVIDER SLIDE REVIEW

## 2022-02-04 NOTE — Progress Notes (Signed)
[place]       Telephone: [phone]?Fax: [phone]   Oncology Clinical Pharmacist Practitioner Progress Note  [name] was contacted via in-person to discuss her chemotherapy regimen for abemaciclib which they receive under the care of Dr. Nicholas Lose.   Current treatment regimen and start date Abemaciclib (11/09/21) Letrozole (07/10/21)   Interval History She continues  on abemaciclib 50 mg by mouth every 12 hours on days 1 to 28 of a 28-day cycle. This is being given in combination with letrozole. Therapy is planned to continue until  2 years in the adjuvant setting per the [facility] trial data .    Response to Therapy Duffy was seen today by clinical pharmacy as a follow-up to her abemaciclib management.  She is accompanied today by her daughter.  She was last seen by clinical pharmacy on 01/07/22 and had a telephone encounter with Dr. Lindi Adie on 01/16/22.  Her follow-up CT of the chest was stable on 01/14/22.  Ms. Nichter says that she is doing very well.  She is not experiencing any shortness of breath.  She is also not needing to take any antiemetics or antidiarrheals.  She states that she has nausea from time to time but it is mild.  Her serum creatinine continues to be slightly elevated but it is lower since her last visit.  She does report drinking more fluids which is especially important since she does a lot of work outside.  We discussed potentially going up on the dose of abemaciclib 100 mg every 12 hours today.  However, after careful consideration, she would like to continue at 50 mg every 12 hours and potentially go up on the dose when she next sees Dr. Lindi Adie in 3 months.  We felt this to be reasonable. Labs, vitals, treatment parameters, and manufacturer guidelines assessing toxicity were reviewed with [name] today. Based on these values, patient is in agreement to continue abemaciclib therapy at this time.  Allergies Allergies   Allergen Reactions   Peanut-Containing Drug Products Nausea And Vomiting    Vitals    02/04/2022    1:12 PM 01/07/2022    2:20 PM 12/27/2021   12:09 PM  Vitals with BMI  Height '5\' 8"'$  '5\' 8"'$    Weight 136 lbs 10 oz 136 lbs 11 oz 135 lbs 11 oz  BMI 20.77 64.33 29.51  Systolic 884 166 063  Diastolic 52 40 56  Pulse 76 65 64     Laboratory Data    Latest Ref Rng & Units 02/04/2022   12:33 PM 01/07/2022    1:44 PM 12/27/2021   11:41 AM  CBC EXTENDED  WBC 4.0 - 10.5 K/uL 3.4  3.5  3.2   RBC 3.87 - 5.11 MIL/uL 3.24  3.41  3.65   Hemoglobin 12.0 - 15.0 g/dL 10.9  11.5  11.9   HCT 36.0 - 46.0 % 31.7  33.2  36.0   Platelets 150 - 400 K/uL 202  199  195   NEUT# 1.7 - 7.7 K/uL 2.0  2.1  1.9   Lymph# 0.7 - 4.0 K/uL 0.9  0.9  0.8        Latest Ref Rng & Units 02/04/2022   12:33 PM 01/07/2022    1:44 PM 12/27/2021   11:41 AM  CMP  Glucose 70 - 99 mg/dL 107  106  100   BUN 8 - 23 mg/dL '14  10  13   '$ Creatinine 0.44 - 1.00 mg/dL  1.12  1.15  1.09   Sodium 135 - 145 mmol/L 143  143  140   Potassium 3.5 - 5.1 mmol/L 3.7  3.6  3.7   Chloride 98 - 111 mmol/L 110  108  106   CO2 22 - 32 mmol/L '27  26  29   '$ Calcium 8.9 - 10.3 mg/dL 9.4  9.7  9.7   Total Protein 6.5 - 8.1 g/dL 7.0  7.0  7.6   Total Bilirubin 0.3 - 1.2 mg/dL 0.8  0.6  0.6   Alkaline Phos 38 - 126 U/L 69  78  80   AST 15 - 41 U/L '17  14  16   '$ ALT 0 - 44 U/L '14  10  11     '$ No results found for: "MG"  Adverse Effects Assessment Shortness of breath: Resolved.  CT of chest on 01/14/22 showed stable findings.  Ms. Coombs knows to contact the clinic should she experience new symptoms Nausea: Sporadic and mild.  Not using any antinausea meds at this time.  Adherence Assessment [name] reports missing 0 doses over the past 4 weeks.   Reason for missed dose: N/A Patient was re-educated on importance of adherence.   Access Assessment MAHOGONY GILCHREST is currently receiving her abemaciclib through [facility].  Ms. Shadix says that she may be changing her insurance in the near future. Insurance concerns: None currently  Medication Reconciliation The patient's medication list was reviewed today with the patient?  No New medications or herbal supplements have recently been started?  No Any medications have been discontinued?  No The medication list was updated and reconciled based on the patient's most recent medication list in the electronic medical record (EMR) including herbal products and OTC medications.   Medications Current Outpatient Medications  Medication Sig Dispense Refill   abemaciclib (VERZENIO) 50 MG tablet Take 1 tablet (50 mg total) by mouth 2 (two) times daily. Swallow tablets whole. Do not chew, crush, or split tablets before swallowing. 56 tablet 5   acetaminophen (TYLENOL) 650 MG CR tablet Take 650 mg by mouth every 8 (eight) hours as needed for pain.     letrozole (FEMARA) 2.5 MG tablet Take 1 tablet (2.5 mg total) by mouth daily. 90 tablet 3   ondansetron (ZOFRAN) 8 MG tablet Take 1 tablet (8 mg total) by mouth every 8 (eight) hours as needed for nausea or vomiting. (Patient not taking: Reported on 11/26/2021) 30 tablet 1   prochlorperazine (COMPAZINE) 10 MG tablet Take 1 tablet (10 mg total) by mouth every 6 (six) hours as needed for nausea or vomiting. (Patient not taking: Reported on 11/26/2021) 30 tablet 2   triamcinolone (KENALOG) 0.025 % ointment Apply 1 Application topically 2 (two) times daily. 30 g 0   valACYclovir (VALTREX) 500 MG tablet Take 500 mg by mouth 2 (two) times daily as needed (for fever bilsters outbreak). (Patient not taking: Reported on 11/26/2021)     No current facility-administered medications for this visit.    Drug-Drug Interactions (DDIs) DDIs were evaluated?  Yes Significant DDIs?  No The patient was instructed to speak with their health care provider and/or the oral chemotherapy pharmacist before starting any new drug, including  prescription or over the counter, natural / herbal products, or vitamins.  Supportive Care ILD/Pneumonitis: we reviewed potential symptoms including cough, shortness, and fatigue.  VTE: reviewed signs of DVT such as leg swelling, redness, pain, or tenderness and signs of PE such as  shortness of breath, rapid or irregular heartbeat, cough, chest pain, or lightheadedness  Dosing Assessment Hepatic adjustments needed?  No Renal adjustments needed?  No, serum creatinine continues to be slightly elevated.  However it is decreased from last time.  She continues to drink plenty of fluids. Toxicity adjustments needed?  No The current dosing regimen is appropriate to continue at this time.  Follow-Up Plan Continue abemaciclib 50 mg by mouth every 12 hours.  She may consider increasing the dose at her next visit with Dr. Lindi Adie in 3 months.  Should she increase dose at her next visit, we discussed that she should get more frequent labs, for example monthly labs, to ensure potential toxicities are identified. Continue letrozole 2.5 mg by mouth daily. Use antidiarrheals and antiemetics as needed.  Currently not needing them at this time. Continue to monitor for signs and symptoms of pneumonitis and blood clots.  Reviewed warning signs again today Follow-up with clinical pharmacy in 6 months or sooner should she increase her dose at her next visit with Dr. Lindi Adie.  [name] participated in the discussion, expressed understanding, and voiced agreement with the above plan. All questions were answered to her satisfaction. The patient was advised to contact the clinic at [phone] with any questions or concerns prior to her return visit.   I spent 30 minutes assessing and educating the patient.  Raina Mina, RPH-CPP, 02/04/2022  1:39 PM   **Disclaimer: This note was dictated with voice recognition software. Similar sounding words can inadvertently be transcribed and this note may contain  transcription errors which may not have been corrected upon publication of note.**

## 2022-02-05 ENCOUNTER — Telehealth: Payer: Self-pay | Admitting: *Deleted

## 2022-02-05 ENCOUNTER — Telehealth: Payer: Self-pay | Admitting: Pharmacist

## 2022-02-05 ENCOUNTER — Other Ambulatory Visit: Payer: Self-pay | Admitting: *Deleted

## 2022-02-05 DIAGNOSIS — C50411 Malignant neoplasm of upper-outer quadrant of right female breast: Secondary | ICD-10-CM

## 2022-02-05 NOTE — Telephone Encounter (Signed)
Did not scheduled appointments with Jenny Reichmann for the 6 month follow up due to no template being available. Please advised.

## 2022-02-05 NOTE — Telephone Encounter (Signed)
Received call from pt stating she was bitten on the right hand by her horse today.  Pt notes the horse did break skin but not enough to cause damage.  Pt denies fever or purulent drainage at this time. Pt states her right hand is starting to swell but she is keeping ice on it at this time.  Pt educated to go urgent care for further evaluation and tx.  Pt states she would prefer to be seen in office due to hx of right sided breast cancer.  [facility] visit scheduled for tomorrow and pt educated to seek care at urgent care if symptoms become worse with bleeding, fever, or increased swelling of extremity. Pt verbalized understanding.

## 2022-02-06 ENCOUNTER — Inpatient Hospital Stay (HOSPITAL_BASED_OUTPATIENT_CLINIC_OR_DEPARTMENT_OTHER): Payer: No Typology Code available for payment source | Admitting: Adult Health

## 2022-02-06 ENCOUNTER — Inpatient Hospital Stay: Payer: No Typology Code available for payment source

## 2022-02-06 ENCOUNTER — Other Ambulatory Visit (HOSPITAL_COMMUNITY): Payer: Self-pay

## 2022-02-06 ENCOUNTER — Encounter: Payer: Self-pay | Admitting: Adult Health

## 2022-02-06 ENCOUNTER — Other Ambulatory Visit: Payer: Self-pay

## 2022-02-06 VITALS — BP 113/58 | HR 66 | Temp 98.9°F | Resp 16 | Ht 68.0 in | Wt 137.6 lb

## 2022-02-06 DIAGNOSIS — C50411 Malignant neoplasm of upper-outer quadrant of right female breast: Secondary | ICD-10-CM

## 2022-02-06 DIAGNOSIS — Z170 Estrogen receptor positive status [ER+]: Secondary | ICD-10-CM

## 2022-02-06 LAB — CBC WITH DIFFERENTIAL (CANCER CENTER ONLY)
Abs Immature Granulocytes: 0 10*3/uL (ref 0.00–0.07)
Basophils Absolute: 0.1 10*3/uL (ref 0.0–0.1)
Basophils Relative: 2 %
Eosinophils Absolute: 0.1 10*3/uL (ref 0.0–0.5)
Eosinophils Relative: 5 %
HCT: 32.7 % — ABNORMAL LOW (ref 36.0–46.0)
Hemoglobin: 11.1 g/dL — ABNORMAL LOW (ref 12.0–15.0)
Immature Granulocytes: 0 %
Lymphocytes Relative: 25 %
Lymphs Abs: 0.7 10*3/uL (ref 0.7–4.0)
MCH: 33.3 pg (ref 26.0–34.0)
MCHC: 33.9 g/dL (ref 30.0–36.0)
MCV: 98.2 fL (ref 80.0–100.0)
Monocytes Absolute: 0.3 10*3/uL (ref 0.1–1.0)
Monocytes Relative: 11 %
Neutro Abs: 1.6 10*3/uL — ABNORMAL LOW (ref 1.7–7.7)
Neutrophils Relative %: 57 %
Platelet Count: 209 10*3/uL (ref 150–400)
RBC: 3.33 MIL/uL — ABNORMAL LOW (ref 3.87–5.11)
RDW: 13.2 % (ref 11.5–15.5)
WBC Count: 2.8 10*3/uL — ABNORMAL LOW (ref 4.0–10.5)
nRBC: 0 % (ref 0.0–0.2)

## 2022-02-06 LAB — CMP (CANCER CENTER ONLY)
ALT: 12 U/L (ref 0–44)
AST: 18 U/L (ref 15–41)
Albumin: 4.4 g/dL (ref 3.5–5.0)
Alkaline Phosphatase: 77 U/L (ref 38–126)
Anion gap: 7 (ref 5–15)
BUN: 16 mg/dL (ref 8–23)
CO2: 27 mmol/L (ref 22–32)
Calcium: 9.7 mg/dL (ref 8.9–10.3)
Chloride: 108 mmol/L (ref 98–111)
Creatinine: 1 mg/dL (ref 0.44–1.00)
GFR, Estimated: >60 mL/min (ref >60)
Glucose, Bld: 86 mg/dL (ref 70–99)
Potassium: 3.7 mmol/L (ref 3.5–5.1)
Sodium: 142 mmol/L (ref 135–145)
Total Bilirubin: 0.7 mg/dL (ref 0.3–1.2)
Total Protein: 7.5 g/dL (ref 6.5–8.1)

## 2022-02-06 MED ORDER — AMOXICILLIN-POT CLAVULANATE 875-125 MG PO TABS: 1.0000 | ORAL_TABLET | Freq: Two times a day (BID) | ORAL | 0 refills | Status: DC

## 2022-02-06 NOTE — Progress Notes (Signed)
[facility] Follow up:    Melissa Lawrence, NP [address] [place] [address]   DIAGNOSIS:  Cancer Staging  Malignant neoplasm of upper-outer quadrant of right breast in female, estrogen receptor positive ([place]) Staging form: Breast, AJCC 8th Edition - Clinical stage from 08/29/2020: Stage IIIA (cT2, cN2, cM0, G3, ER+, PR+, HER2-) - Signed by Gardenia Phlegm, NP on 09/07/2020 Stage prefix: Initial diagnosis Stage used in treatment planning: Yes National guidelines used in treatment planning: Yes Type of national guideline used in treatment planning: NCCN   SUMMARY OF ONCOLOGIC HISTORY: Oncology History  Malignant neoplasm of upper-outer quadrant of right breast in female, estrogen receptor positive ([place])  08/21/2020 Initial Diagnosis   right breast upper outer quadrant biopsy 08/21/2020 for a clinical mT2 N1, stage IIA/B invasive ductal carcinoma, grade 3, estrogen receptor positive, progesterone receptor weakly positive, HER-2 not amplified, with an MIB-1 of 40%   08/29/2020 Cancer Staging   Staging form: Breast, AJCC 8th Edition - Clinical stage from 08/29/2020: Stage IIIA (cT2, cN2, cM0, G3, ER+, PR+, HER2-) - Signed by Gardenia Phlegm, NP on 09/07/2020 Stage prefix: Initial diagnosis Stage used in treatment planning: Yes National guidelines used in treatment planning: Yes Type of national guideline used in treatment planning: NCCN   09/18/2020 - 03/01/2021 Chemotherapy   AC X 4 foll by Taxol X 12    09/18/2020 Genetic Testing   Negative genetic testing:  No pathogenic variants detected on the [facility] CancerNext-Expanded + RNAinsight panel. The report date is 09/18/2020.   The CancerNext-Expanded + RNAinsight gene panel offered by [employer] and includes sequencing and rearrangement analysis for the following 77 genes: AIP, ALK, APC, ATM, AXIN2, BAP1, BARD1, BLM, BMPR1A, BRCA1, BRCA2, BRIP1, CDC73, CDH1, CDK4, CDKN1B, CDKN2A, CHEK2,  CTNNA1, DICER1, FANCC, FH, FLCN, GALNT12, KIF1B, LZTR1, MAX, MEN1, MET, MLH1, MSH2, MSH3, MSH6, MUTYH, NBN, NF1, NF2, NTHL1, PALB2, PHOX2B, PMS2, POT1, PRKAR1A, PTCH1, PTEN, RAD51C, RAD51D, RB1, RECQL, RET, SDHA, SDHAF2, SDHB, SDHC, SDHD, SMAD4, SMARCA4, SMARCB1, SMARCE1, STK11, SUFU, TMEM127, TP53, TSC1, TSC2, VHL and XRCC2 (sequencing and deletion/duplication); EGFR, EGLN1, HOXB13, KIT, MITF, PDGFRA, POLD1 and POLE (sequencing only); EPCAM and GREM1 (deletion/duplication only). RNA data is routinely analyzed for use in variant interpretation for all genes.    Surgery   bilateral mastectomies with right axillary lymph nodes dissection on 04/25/2021 showing (A) on the left, no evidence of malignancy (B) on the right a residual ypT1c ypN0 invasive ductal carcinoma, grade 3, with a focally positive deep margin a total of 24 right axillary and 2 right intramammary lymph nodes were removed, all clear   06/12/2021 - 07/24/2021 Radiation Therapy   Adj XRT   06/2021 -  Anti-estrogen oral therapy   Letrozole beginning 06/2021; Verzenio added 11/09/2021     CURRENT THERAPY:   INTERVAL HISTORY: Melissa Mcgrath 61 y.o. female returns for f/u of a horse bite on her hand.  She was bit yesterday and the skin was broken.  The swelling is better and she can make a fist now today.  No redness noted but + discomfort and tenderness.  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  She denies fever or chills, along with denying worsening in her lymphedema.   She continues on Letrozole daily and Verzenio BID.  She is tolerating these well.     Patient Active Problem List   Diagnosis Date Noted   Port-A-Cath in place 10/04/2020  Genetic testing 09/18/2020   History of augmentation mammoplasty 08/31/2020   Family history of breast cancer    Family history of pancreatic cancer    Family history of melanoma    Family history of ovarian cancer    Malignant neoplasm of upper-outer quadrant of  right breast in female, estrogen receptor positive ([place]) 08/28/2020   Vaginal atrophy 11/09/2014   Menopause 01/05/2014    is allergic to peanut-containing drug products.  MEDICAL HISTORY: Past Medical History:  Diagnosis Date   Anemia    Depression 01/05/2014   Facial basal cell cancer    Family history of breast cancer    Family history of melanoma    Family history of ovarian cancer    Family history of pancreatic cancer    History of radiation therapy    right chest wall and subclavian 06/11/2021-07/26/2021  Dr Gery Pray   Menopause 01/05/2014   Vaginal atrophy 11/09/2014    SURGICAL HISTORY: Past Surgical History:  Procedure Laterality Date   BILATERAL TOTAL MASTECTOMY WITH AXILLARY LYMPH NODE DISSECTION Right 04/25/2021   Procedure: RIGHT TOTAL MASTECTOMY WITH RIGHT AXILLARY LYMPH NODE DISSECTION;  Surgeon: Stark Klein, MD;  Location: [facility];  Service: General;  Laterality: Right;   BREAST ENHANCEMENT SURGERY     BREAST IMPLANT REMOVAL Bilateral 04/25/2021   Procedure: REMOVAL BREAST IMPLANTS;  Surgeon: Stark Klein, MD;  Location: [facility];  Service: General;  Laterality: Bilateral;   CARPAL TUNNEL RELEASE Right    PORT-A-CATH REMOVAL N/A 10/15/2021   Procedure: REMOVAL PORT-A-CATH;  Surgeon: Stark Klein, MD;  Location: [facility];  Service: General;  Laterality: N/A;   PORTACATH PLACEMENT N/A 09/17/2020   Procedure: INSERTION PORT-A-CATH;  Surgeon: Stark Klein, MD;  Location: [facility];  Service: General;  Laterality: N/A;   refractive lensectomy Bilateral    SCAR REVISION Left 10/15/2021   Procedure: REVISION OF LEFT MASTECTOMY SCAR;  Surgeon: Stark Klein, MD;  Location: [facility];  Service: General;  Laterality: Left;   TOTAL MASTECTOMY Left 04/25/2021   Procedure: LEFT TOTAL MASTECTOMY;  Surgeon: Stark Klein, MD;  Location: [facility];  Service: General;  Laterality: Left;    SOCIAL HISTORY: Social History   Socioeconomic History   Marital status: Married     Spouse name: Not on file   Number of children: Not on file   Years of education: Not on file   Highest education level: Not on file  Occupational History   Not on file  Tobacco Use   Smoking status: Never   Smokeless tobacco: Never  Vaping Use   Vaping Use: Never used  Substance and Sexual Activity   Alcohol use: No   Drug use: No   Sexual activity: Not Currently    Birth control/protection: Post-menopausal  Other Topics Concern   Not on file  Social History Narrative   Married 39 years,lives with husband.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.   Social Determinants of Health   Financial Resource Strain: Low Risk  (08/29/2020)   Overall Financial Resource Strain (CARDIA)    Difficulty of Paying Living Expenses: Not very hard  Food Insecurity: No Food Insecurity (08/29/2020)   Hunger Vital Sign    Worried About Running Out of Food in the Last Year: Never true    Ran Out of Food in the Last Year: Never true  Transportation Needs: No Transportation Needs (08/29/2020)   PRAPARE - Hydrologist (Medical): No    Lack of Transportation (Non-Medical): No  Physical Activity: Not on file  Stress: Not on file  Social Connections: Not on file  Intimate Partner Violence: Not on file    FAMILY HISTORY: Family History  Problem Relation Age of Onset   Breast cancer Mother 61       breast   Hypertension Father    Heart attack Father    Alzheimer's disease Father    Fibromyalgia Sister    Diabetes Sister    Heart attack Sister    Hypertension Sister    Hypertension Brother    Diabetes Brother    Pancreatic cancer Maternal Grandmother 83       Pancreatic   Melanoma Paternal Uncle        dx 9s   Melanoma Paternal Uncle        dx 58s   Ovarian cancer Paternal Aunt        dx 50s/60s    Review of Systems  Constitutional:  Negative for appetite change, chills, fatigue, fever and unexpected weight change.  HENT:   Negative for hearing loss, lump/mass and  trouble swallowing.   Eyes:  Negative for eye problems and icterus.  Respiratory:  Negative for chest tightness, cough and shortness of breath.   Cardiovascular:  Negative for chest pain, leg swelling and palpitations.  Gastrointestinal:  Negative for abdominal distention, abdominal pain, constipation, diarrhea, nausea and vomiting.  Endocrine: Negative for hot flashes.  Genitourinary:  Negative for difficulty urinating.   Musculoskeletal:  Negative for arthralgias.  Skin:  Negative for itching and rash.  Neurological:  Negative for dizziness, extremity weakness, headaches and numbness.  Hematological:  Negative for adenopathy. Does not bruise/bleed easily.  Psychiatric/Behavioral:  Negative for depression. The patient is not nervous/anxious.       PHYSICAL EXAMINATION  ECOG PERFORMANCE STATUS: 1 - Symptomatic but completely ambulatory  Vitals:   02/06/22 0926  BP: (!) 113/58  Pulse: 66  Resp: 16  Temp: 98.9 F (37.2 C)  SpO2: 100%    Physical Exam Constitutional:      General: She is not in acute distress.    Appearance: Normal appearance. She is not toxic-appearing.  HENT:     Head: Normocephalic and atraumatic.  Eyes:     General: No scleral icterus. Cardiovascular:     Rate and Rhythm: Normal rate and regular rhythm.     Pulses: Normal pulses.     Heart sounds: Normal heart sounds.  Pulmonary:     Effort: Pulmonary effort is normal.     Breath sounds: Normal breath sounds.  Abdominal:     General: Abdomen is flat. Bowel sounds are normal. There is no distension.     Palpations: Abdomen is soft.     Tenderness: There is no abdominal tenderness.  Musculoskeletal:        General: No swelling.     Cervical back: Neck supple.  Lymphadenopathy:     Cervical: No cervical adenopathy.  Skin:    General: Skin is warm and dry.     Findings: No rash.     Comments: Right hand with scabbing over third digit and mild swelling and proximal phalynx along with mild  tenderness and slight erythema.  FROM in all right fingers.    Neurological:     General: No focal deficit present.     Mental Status: She is alert.  Psychiatric:        Mood and Affect: Mood normal.        Behavior: Behavior normal.  LABORATORY DATA:  CBC    Component Value Date/Time   WBC 2.8 (L) 02/06/2022 0901   WBC 4.8 10/10/2021 1122   RBC 3.33 (L) 02/06/2022 0901   HGB 11.1 (L) 02/06/2022 0901   HCT 32.7 (L) 02/06/2022 0901   PLT 209 02/06/2022 0901   MCV 98.2 02/06/2022 0901   MCH 33.3 02/06/2022 0901   MCHC 33.9 02/06/2022 0901   RDW 13.2 02/06/2022 0901   LYMPHSABS 0.7 02/06/2022 0901   MONOABS 0.3 02/06/2022 0901   EOSABS 0.1 02/06/2022 0901   BASOSABS 0.1 02/06/2022 0901    CMP     Component Value Date/Time   NA 142 02/06/2022 0901   K 3.7 02/06/2022 0901   CL 108 02/06/2022 0901   CO2 27 02/06/2022 0901   GLUCOSE 86 02/06/2022 0901   BUN 16 02/06/2022 0901   CREATININE 1.00 02/06/2022 0901   CREATININE 0.76 12/20/2013 1207   CALCIUM 9.7 02/06/2022 0901   PROT 7.5 02/06/2022 0901   ALBUMIN 4.4 02/06/2022 0901   AST 18 02/06/2022 0901   ALT 12 02/06/2022 0901   ALKPHOS 77 02/06/2022 0901   BILITOT 0.7 02/06/2022 0901   GFRNONAA >60 02/06/2022 0901       ASSESSMENT and THERAPY PLAN:   Malignant neoplasm of upper-outer quadrant of right breast in female, estrogen receptor positive ([place]) Melissa Mcgrath is a 61 year old woman with stage IIIA breast cancer currently on treatment with Letrozole and verzenio with good tolerance.  We discussed that she is immunocompromised with being on Verzenio and she has an open wound on the hand of the right arm where she has lymphedema.  I reviewed "horse bite" in the [facility] antibiotic guide which recommended Augmentin.  She has tolerated this well previously therefore I ordered a 7 day course.  She knows to call me if she has any issues with this.    She has f/u with [place] in 04/2022.  She knows to call for any questions  or concerns prior to her next visit.  Red flags reviewed.    All questions were answered. The patient knows to call the clinic with any problems, questions or concerns. We can certainly see the patient much sooner if necessary.  Total encounter time:20 minutes*in face-to-face visit time, chart review, lab review, care coordination, order entry, and documentation of the encounter time.    Wilber Bihari, NP 02/06/22 11:36 PM Medical Oncology and Hematology [facility] [place] [place] [address] Tel. [phone]    Fax. [phone]  *Total Encounter Time as defined by the Centers for Medicare and Medicaid Services includes, in addition to the face-to-face time of a patient visit (documented in the note above) non-face-to-face time: obtaining and reviewing outside history, ordering and reviewing medications, tests or procedures, care coordination (communications with other health care professionals or caregivers) and documentation in the medical record.

## 2022-02-06 NOTE — Assessment & Plan Note (Signed)
Melissa Mcgrath is a 61 year old woman with stage IIIA breast cancer currently on treatment with Letrozole and verzenio with good tolerance.  We discussed that she is immunocompromised with being on Verzenio and she has an open wound on the hand of the right arm where she has lymphedema.  I reviewed "horse bite" in the [facility] antibiotic guide which recommended Augmentin.  She has tolerated this well previously therefore I ordered a 7 day course.  She knows to call me if she has any issues with this.

## 2022-02-10 ENCOUNTER — Other Ambulatory Visit (HOSPITAL_COMMUNITY): Payer: Self-pay

## 2022-03-03 ENCOUNTER — Telehealth: Payer: Self-pay | Admitting: Pharmacist

## 2022-03-03 ENCOUNTER — Other Ambulatory Visit (HOSPITAL_COMMUNITY): Payer: Self-pay

## 2022-03-03 MED ORDER — ABEMACICLIB 100 MG PO TABS
100.0000 mg | ORAL_TABLET | Freq: Two times a day (BID) | ORAL | 2 refills | Status: DC
  Filled 2022-03-03 – 2022-03-10 (×2): qty 56, 28d supply, fill #0
  Filled 2022-04-03: qty 56, 28d supply, fill #1

## 2022-03-03 NOTE — Telephone Encounter (Signed)
[facility]       Telephone: [phone]?Fax: [phone]   Oncology Clinical Pharmacist Practitioner Progress Note  Melissa Mcgrath is a 61 y.o. female with a diagnosis of breast cancer currently on abemaciclib in the adjuvant setting under the care of Dr. Nicholas Lose. They were contacted today via telephone. We verified that we were speaking to the correct patient using two patient identifiers.  Interval History Melissa Mcgrath was contacted by clinical pharmacy today after receiving a voicemail inquiring about increasing her dose of abemaciclib.  She is currently on 50 mg by mouth every 12 hours and is interested in increasing to 100 mg by mouth every 12 hours.  She states that she has about 2 weeks left of medication and would like to start this new dose on 03/16/22.  We will send this prescription in today to the [facility] and she will follow-up with clinical pharmacy 2 weeks after starting her new dose for a toxicity check.  She knows that she can contact the clinic sooner should she have any questions or concerns.  She states that her horse bite on her right hand has healed nicely after seeing our nurse practitioner Thedore Mins on 02/06/2022 and being prescribed Augmentin.  Follow-Up Plan Increase abemaciclib to 100 mg by mouth every 12 hours starting 03/12/22 Continue letrozole 2.5 mg by mouth daily Labs, pharmacy clinic visit, on 03/31/22 and 04/14/22 Labs, Thedore Mins visit, on 05/19/22  Melissa Mcgrath participated in the discussion, expressed understanding, and voiced agreement with the above plan. All questions were answered to her satisfaction. The patient was advised to contact the clinic at [phone] with any questions or concerns prior to her return visit.  Clinical pharmacy will continue to support Melissa Mcgrath and Dr. Lindi Adie as needed.  I spent 10 minutes assessing the patient.  Raina Mina, RPH-CPP,  03/03/2022  4:54  PM   **Disclaimer: This note was dictated with voice recognition software. Similar sounding words can inadvertently be transcribed and this note may contain transcription errors which may not have been corrected upon publication of note.**

## 2022-03-06 ENCOUNTER — Other Ambulatory Visit (HOSPITAL_COMMUNITY): Payer: Self-pay

## 2022-03-07 ENCOUNTER — Other Ambulatory Visit (HOSPITAL_COMMUNITY): Payer: Self-pay

## 2022-03-10 ENCOUNTER — Other Ambulatory Visit (HOSPITAL_COMMUNITY): Payer: Self-pay

## 2022-03-13 ENCOUNTER — Other Ambulatory Visit (HOSPITAL_COMMUNITY): Payer: Self-pay

## 2022-03-19 ENCOUNTER — Other Ambulatory Visit (HOSPITAL_COMMUNITY): Payer: Self-pay

## 2022-03-24 ENCOUNTER — Other Ambulatory Visit: Payer: Self-pay | Admitting: Pharmacist

## 2022-03-31 ENCOUNTER — Inpatient Hospital Stay: Payer: No Typology Code available for payment source | Attending: Adult Health

## 2022-03-31 ENCOUNTER — Inpatient Hospital Stay: Payer: No Typology Code available for payment source | Admitting: Pharmacist

## 2022-03-31 ENCOUNTER — Other Ambulatory Visit: Payer: Self-pay

## 2022-03-31 VITALS — BP 118/48 | HR 67 | Temp 98.1°F | Resp 16 | Ht 68.0 in | Wt 137.3 lb

## 2022-03-31 DIAGNOSIS — Z170 Estrogen receptor positive status [ER+]: Secondary | ICD-10-CM | POA: Insufficient documentation

## 2022-03-31 DIAGNOSIS — C50411 Malignant neoplasm of upper-outer quadrant of right female breast: Secondary | ICD-10-CM

## 2022-03-31 LAB — CBC WITH DIFFERENTIAL (CANCER CENTER ONLY)
Abs Immature Granulocytes: 0 10*3/uL (ref 0.00–0.07)
Basophils Absolute: 0.1 10*3/uL (ref 0.0–0.1)
Basophils Relative: 2 %
Eosinophils Absolute: 0.1 10*3/uL (ref 0.0–0.5)
Eosinophils Relative: 3 %
HCT: 32.3 % — ABNORMAL LOW (ref 36.0–46.0)
Hemoglobin: 10.9 g/dL — ABNORMAL LOW (ref 12.0–15.0)
Immature Granulocytes: 0 %
Lymphocytes Relative: 21 %
Lymphs Abs: 0.7 10*3/uL (ref 0.7–4.0)
MCH: 34.7 pg — ABNORMAL HIGH (ref 26.0–34.0)
MCHC: 33.7 g/dL (ref 30.0–36.0)
MCV: 102.9 fL — ABNORMAL HIGH (ref 80.0–100.0)
Monocytes Absolute: 0.3 10*3/uL (ref 0.1–1.0)
Monocytes Relative: 9 %
Neutro Abs: 2 10*3/uL (ref 1.7–7.7)
Neutrophils Relative %: 65 %
Platelet Count: 171 10*3/uL (ref 150–400)
RBC: 3.14 MIL/uL — ABNORMAL LOW (ref 3.87–5.11)
RDW: 13.1 % (ref 11.5–15.5)
WBC Count: 3.1 10*3/uL — ABNORMAL LOW (ref 4.0–10.5)
nRBC: 0 % (ref 0.0–0.2)

## 2022-03-31 LAB — CMP (CANCER CENTER ONLY)
ALT: 10 U/L (ref 0–44)
AST: 15 U/L (ref 15–41)
Albumin: 4 g/dL (ref 3.5–5.0)
Alkaline Phosphatase: 76 U/L (ref 38–126)
Anion gap: 4 — ABNORMAL LOW (ref 5–15)
BUN: 13 mg/dL (ref 8–23)
CO2: 29 mmol/L (ref 22–32)
Calcium: 9.3 mg/dL (ref 8.9–10.3)
Chloride: 107 mmol/L (ref 98–111)
Creatinine: 1.08 mg/dL — ABNORMAL HIGH (ref 0.44–1.00)
GFR, Estimated: 58 mL/min — ABNORMAL LOW (ref >60)
Glucose, Bld: 99 mg/dL (ref 70–99)
Potassium: 4 mmol/L (ref 3.5–5.1)
Sodium: 140 mmol/L (ref 135–145)
Total Bilirubin: 0.6 mg/dL (ref 0.3–1.2)
Total Protein: 7.1 g/dL (ref 6.5–8.1)

## 2022-03-31 LAB — SAVE SMEAR(SSMR), FOR PROVIDER SLIDE REVIEW

## 2022-03-31 NOTE — Progress Notes (Signed)
[place]       Telephone: [phone]?Fax: [phone]   Oncology Clinical Pharmacist Practitioner Progress Note  Melissa Mcgrath was contacted via in-person to discuss her chemotherapy regimen for abemaciclib which they receive under the care of Dr. Nicholas Mcgrath.   Current treatment regimen and start date Abemaciclib (11/09/21) -- increased to 100 mg BID on 03/16/22 Letrozole (07/10/21)   Interval History She continues  on abemaciclib 100 mg by mouth every 12 hours on days 1 to 28 of a 28-day cycle. This is being given in combination with letrozole. Therapy is planned to continue until  2 years in the adjuvant setting per the [facility] trial data .  Melissa Mcgrath was seen today by clinical pharmacy as a follow-up to her abemaciclib management.  She last had a telephone encounter with clinical pharmacy on 03/03/22 and last saw Dr. Lindi Mcgrath on 01/16/22.  As above, she increased her dose of abemaciclib to 100 mg every 12 hours on 03/16/22 and is here today for a toxicity assessment.  Response to Therapy Melissa Mcgrath is doing well.  She has not noticed any increase in side effects since starting abemaciclib at a higher dose.  She has been on the higher dose for approximately 2 weeks.  Clinical pharmacy will see her again with labs in 2 weeks on 04/14/22 and she will see our nurse practitioner Melissa Mcgrath 1 month later.  At that time if she is tolerating abemaciclib well, she will have a follow-up appointment with labs in January of next year with Dr. Lindi Mcgrath.  At that point she can probably be seen every 3 months with labs.  She continues to have some nausea but it is mild.  She is not using any antinausea meds at this time.  Her loose stools are stable and she is not currently using loperamide at this time.  We did remove the antibiotic and the triamcinolone cream that was on her med list.  She confirmed that she is no longer taking these medications.  Her serum creatinine remains  relatively stable and she continues to drink plenty of fluids.  She knows that she can contact Dr. Geralyn Mcgrath clinic at any time should she have questions or concerns prior to her next visit. Labs, vitals, treatment parameters, and manufacturer guidelines assessing toxicity were reviewed with Melissa Mcgrath today. Based on these values, patient is in agreement to continue abemaciclib therapy at this time.  Allergies Allergies  Allergen Reactions   Peanut-Containing Drug Products Nausea And Vomiting    Vitals    03/31/2022   11:09 AM 02/06/2022    9:26 AM 02/04/2022    1:12 PM  Vitals with BMI  Height '5\' 8"'$  '5\' 8"'$  '5\' 8"'$   Weight 137 lbs 5 oz 137 lbs 10 oz 136 lbs 10 oz  BMI 20.88 62.37 62.83  Systolic 151 761 607  Diastolic 48 58 52  Pulse 67 66 76   Temp Readings from Last 3 Encounters:  03/31/22 98.1 F (36.7 C) (Temporal)  02/06/22 98.9 F (37.2 C) (Tympanic)  02/04/22 97.7 F (36.5 C) (Tympanic)    Laboratory Data    Latest Ref Rng & Units 03/31/2022   10:35 AM 02/06/2022    9:01 AM 02/04/2022   12:33 PM  CBC EXTENDED  WBC 4.0 - 10.5 K/uL 3.1  2.8  3.4   RBC 3.87 - 5.11 MIL/uL 3.14  3.33  3.24   Hemoglobin 12.0 - 15.0 g/dL 10.9  11.1  10.9  HCT 36.0 - 46.0 % 32.3  32.7  31.7   Platelets 150 - 400 K/uL 171  209  202   NEUT# 1.7 - 7.7 K/uL 2.0  1.6  2.0   Lymph# 0.7 - 4.0 K/uL 0.7  0.7  0.9        Latest Ref Rng & Units 03/31/2022   10:35 AM 02/06/2022    9:01 AM 02/04/2022   12:33 PM  CMP  Glucose 70 - 99 mg/dL 99  86  107   BUN 8 - 23 mg/dL '13  16  14   '$ Creatinine 0.44 - 1.00 mg/dL 1.08  1.00  1.12   Sodium 135 - 145 mmol/L 140  142  143   Potassium 3.5 - 5.1 mmol/L 4.0  3.7  3.7   Chloride 98 - 111 mmol/L 107  108  110   CO2 22 - 32 mmol/L '29  27  27   '$ Calcium 8.9 - 10.3 mg/dL 9.3  9.7  9.4   Total Protein 6.5 - 8.1 g/dL 7.1  7.5  7.0   Total Bilirubin 0.3 - 1.2 mg/dL 0.6  0.7  0.8   Alkaline Phos 38 - 126 U/L 76  77  69   AST 15 - 41 U/L '15  18  17   '$ ALT 0 -  44 U/L '10  12  14     '$ No results found for: "MG"  Adverse Effects Assessment Nausea: Mild.  Not using any antinausea meds.  She does have prochlorperazine and ondansetron and hand should she need them. Loose stools: Mild.  Not using any antidiarrheal meds.  She does have loperamide on hand should she need it. Serum creatinine: Stable.  She continues to drink plenty of fluids and is very active. Hemoglobin: Estimated at 10.9 g/dL today.  Fairly stable from last visit.  Asymptomatic.  We will continue to monitor.  Adherence Assessment Melissa Mcgrath reports missing 2 doses over the past 4 weeks.   Reason for missed dose: 1 dose deteriorated due to condensation on cup.  The other dose patient was out and did not get back in time to take medication Patient was re-educated on importance of adherence.   Access Assessment Melissa Mcgrath is currently receiving her abemaciclib through [facility] concerns: None  Medication Reconciliation The patient's medication list was reviewed today with the patient?  Yes New medications or herbal supplements have recently been started?  No Any medications have been discontinued?  Yes, antibiotic has been discontinued completed course.  Melissa Mcgrath also states that she is not taking triamcinolone cream.  These have both been removed The medication list was updated and reconciled based on the patient's most recent medication list in the electronic medical record (EMR) including herbal products and OTC medications.   Medications Current Outpatient Medications  Medication Sig Dispense Refill   abemaciclib (VERZENIO) 100 MG tablet Take 1 tablet (100 mg total) by mouth 2 (two) times daily. Swallow tablets whole. Do not chew, crush, or split tablets before swallowing. 56 tablet 2   acetaminophen (TYLENOL) 650 MG CR tablet Take 650 mg by mouth every 8 (eight) hours as needed for pain.     letrozole (FEMARA) 2.5 MG tablet Take 1  tablet (2.5 mg total) by mouth daily. 90 tablet 3   ondansetron (ZOFRAN) 8 MG tablet Take 1 tablet (8 mg total) by mouth every 8 (eight) hours as needed for nausea or vomiting. (Patient not taking: Reported on 03/31/2022)  30 tablet 1   prochlorperazine (COMPAZINE) 10 MG tablet Take 1 tablet (10 mg total) by mouth every 6 (six) hours as needed for nausea or vomiting. (Patient not taking: Reported on 03/31/2022) 30 tablet 2   valACYclovir (VALTREX) 500 MG tablet Take 500 mg by mouth 2 (two) times daily as needed (for fever bilsters outbreak).     No current facility-administered medications for this visit.    Drug-Drug Interactions (DDIs) DDIs were evaluated?  Yes Significant DDIs?  No The patient was instructed to speak with their health care provider and/or the oral chemotherapy pharmacist before starting any new drug, including prescription or over the counter, natural / herbal products, or vitamins.  Supportive Care Diarrhea: we reviewed that diarrhea is common with abemaciclib and confirmed that she does have loperamide (Imodium) at home.  We reviewed how to take this medication PRN Neutropenia: we discussed the importance of having a thermometer and what the Centers for Disease Control and Prevention (CDC) considers a fever which is 100.11F (38C) or higher.  Gave patient 24/7 triage line to call if any fevers or symptoms ILD/Pneumonitis: we reviewed potential symptoms including cough, shortness, and fatigue.  VTE: reviewed signs of DVT such as leg swelling, redness, pain, or tenderness and signs of PE such as shortness of breath, rapid or irregular heartbeat, cough, chest pain, or lightheadedness  Dosing Assessment Hepatic adjustments needed? No  Renal adjustments needed? No  Toxicity adjustments needed? No  The current dosing regimen is appropriate to continue at this time.  Follow-Up Plan Continue abemaciclib 100 mg by mouth every 12 hours Continue letrozole 2.5 mg by mouth  daily Continue to monitor for increased side effects with abemaciclib since increasing dose on 03/16/22 Labs, pharmacy clinic visit, on 04/14/22 Labs, Melissa Mcgrath visit, on 05/19/22 Will add labs, Dr. Lindi Mcgrath visit, on 07/16/22.  At that time, if Melissa Mcgrath continues to tolerate abemaciclib, she can likely have labs every 3 months with visits.  Melissa Mcgrath participated in the discussion, expressed understanding, and voiced agreement with the above plan. All questions were answered to her satisfaction. The patient was advised to contact the clinic at [phone] with any questions or concerns prior to her return visit.   I spent 30 minutes assessing and educating the patient.  Raina Mina, RPH-CPP, 03/31/2022  11:41 AM   **Disclaimer: This note was dictated with voice recognition software. Similar sounding words can inadvertently be transcribed and this note may contain transcription errors which may not have been corrected upon publication of note.**

## 2022-04-03 ENCOUNTER — Other Ambulatory Visit (HOSPITAL_COMMUNITY): Payer: Self-pay

## 2022-04-10 ENCOUNTER — Other Ambulatory Visit (HOSPITAL_COMMUNITY): Payer: Self-pay

## 2022-04-14 ENCOUNTER — Inpatient Hospital Stay: Payer: No Typology Code available for payment source

## 2022-04-14 ENCOUNTER — Inpatient Hospital Stay: Payer: No Typology Code available for payment source | Admitting: Pharmacist

## 2022-04-18 IMAGING — NM NM BONE WHOLE BODY
2 series · 2 of 2 positions shown · non-contrast
Comparison: CT scan of same day.

CLINICAL DATA: History of breast cancer.

EXAM:
NUCLEAR MEDICINE WHOLE BODY BONE SCAN
TECHNIQUE: Whole body anterior and posterior images were obtained approximately
3 hours after intravenous injection of radiopharmaceutical.
RADIOPHARMACEUTICALS:  19.8 mCi Eechnetium-77m MDP IV

[Series 1: wbr_bone_40 whole body · 2.66mm/px · 1 of 1 slices shown (1 of 2)]
[im 1/1]
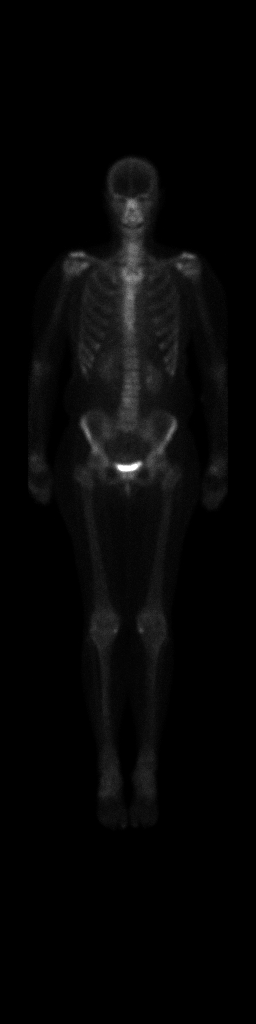

[Series 1: wbr_bone_40 whole body · 2.66mm/px · 1 of 1 slices shown (2 of 2)]
[im 1/1]
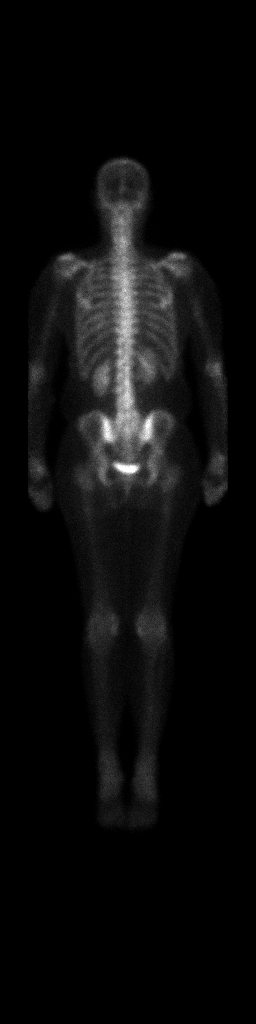

[2 of 2 positions shown; findings below may reference images not displayed]

FINDINGS: Minimal uptake is seen involving both knees consistent with
degenerative change. No other definite areas of abnormal uptake are
noted.
IMPRESSION: No definite scintigraphic evidence of osseous metastases.

## 2022-04-18 IMAGING — CT CT CHEST W/ CM
2 of 4 series · 15 of 36 positions shown, 18 images · IV contrast (omnipaque)
Comparison: Bone scan evaluation of the same date.

CLINICAL DATA: Breast cancer staging in a 59-year-old female.
RIGHT-sided breast cancer by report

EXAM:
CT CHEST WITH CONTRAST
TECHNIQUE: Multidetector CT imaging of the chest was performed during
intravenous contrast administration.
CONTRAST:  75mL OMNIPAQUE IOHEXOL 300 MG/ML  SOLN

[Series 2: axial st · axial · 0.79mm/px · z∈[+1360,+1652]mm · 12 of 174 slices shown, 15 images]
[im 14/174  mediastinal]
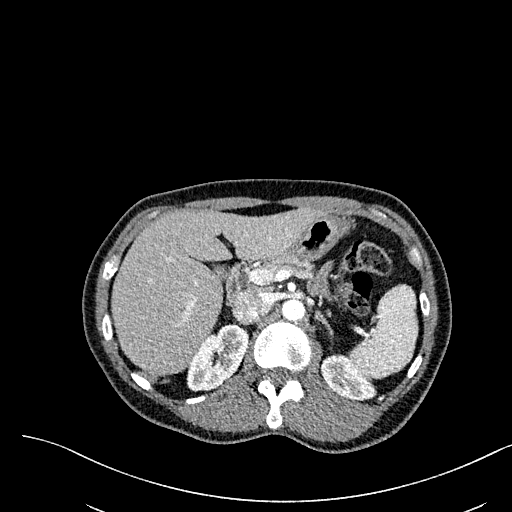
[im 14/174  lung]
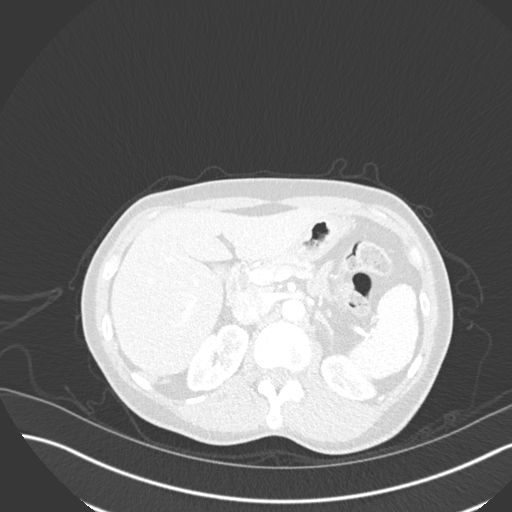
[im 27/174  lung]
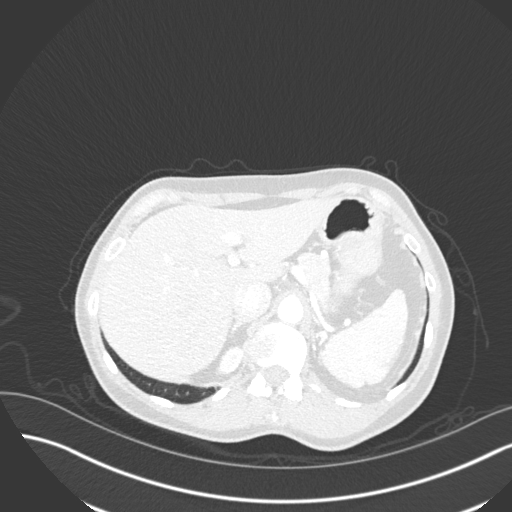
[im 40/174  lung]
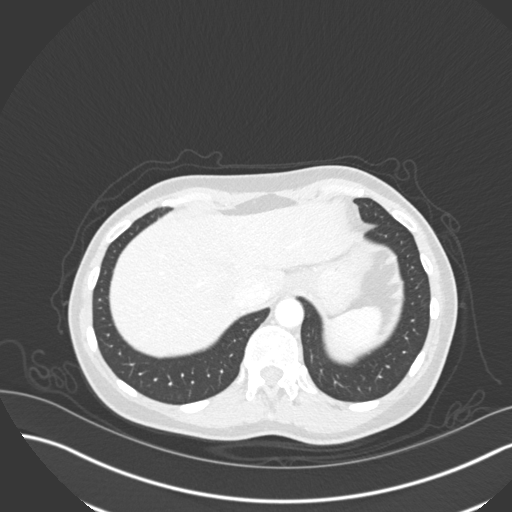
[im 54/174  lung]
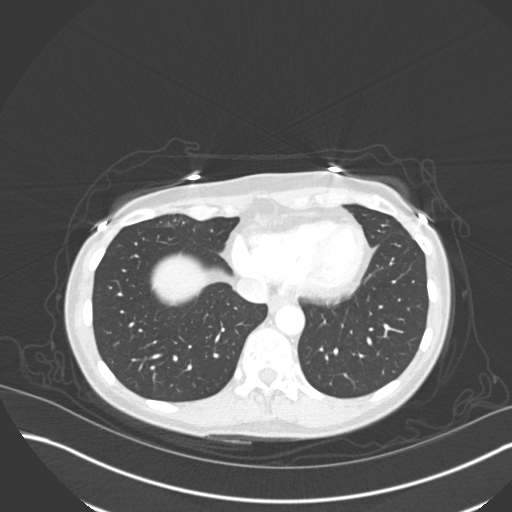
[im 67/174  mediastinal]
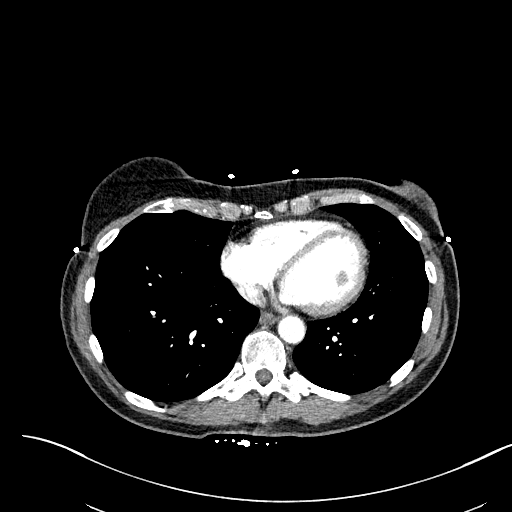
[im 67/174  lung]
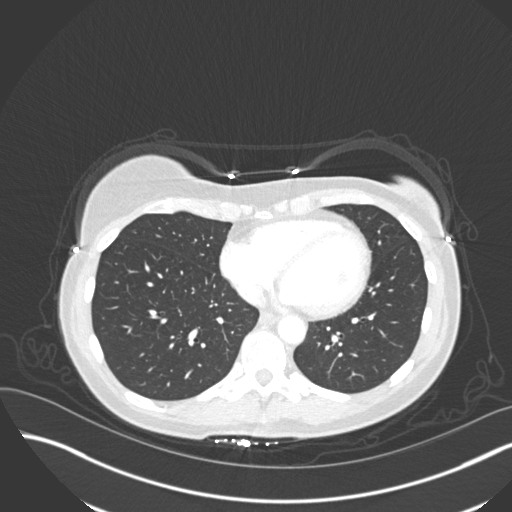
[im 80/174  lung]
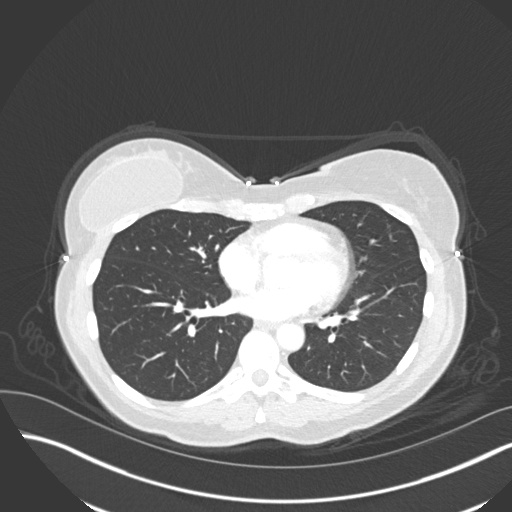
[im 94/174  lung]
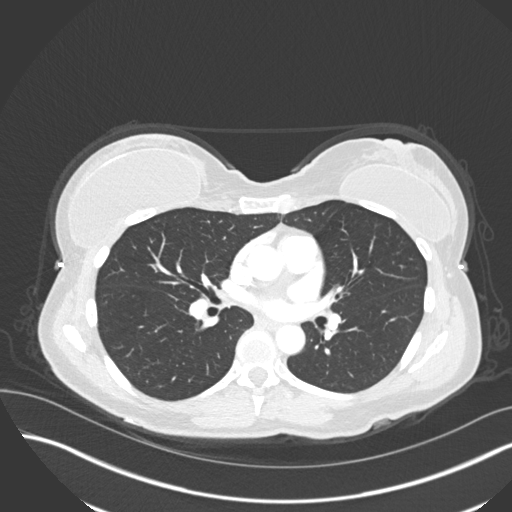
[im 107/174  lung]
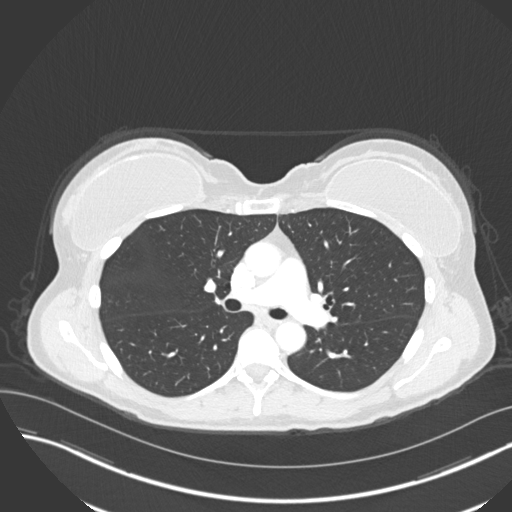
[im 120/174  mediastinal]
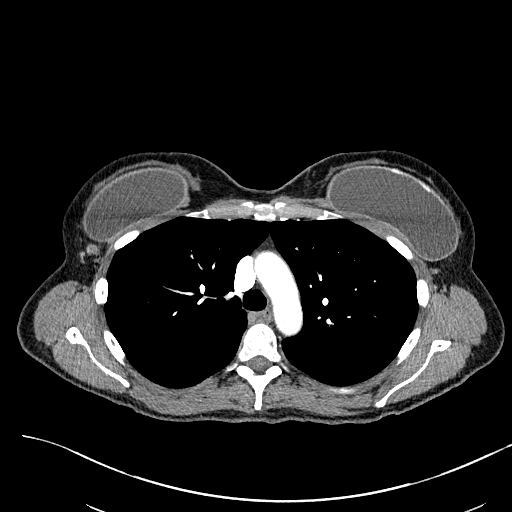
[im 120/174  lung]
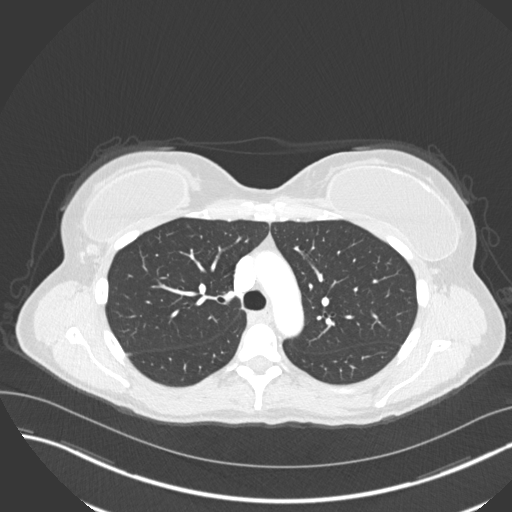
[im 134/174  lung]
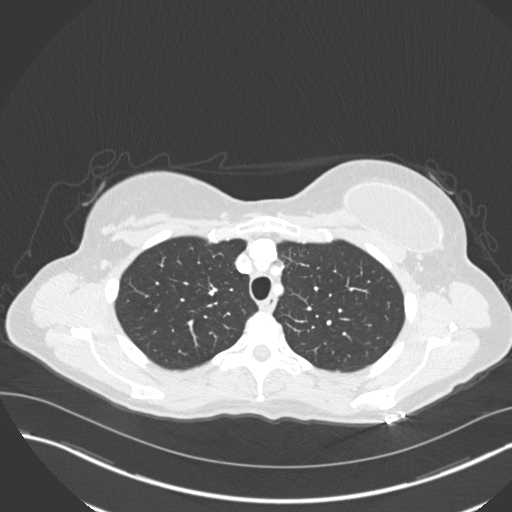
[im 147/174  lung]
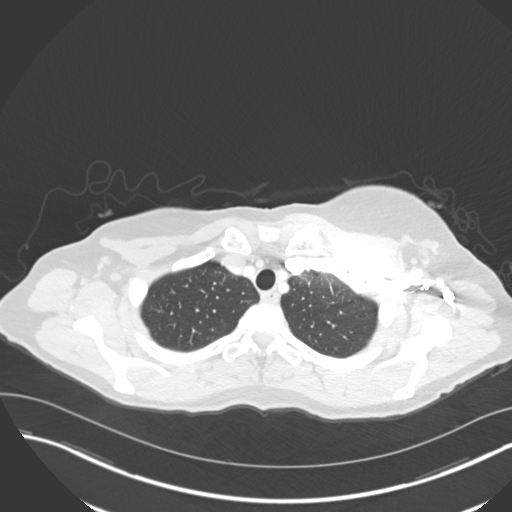
[im 160/174  lung]
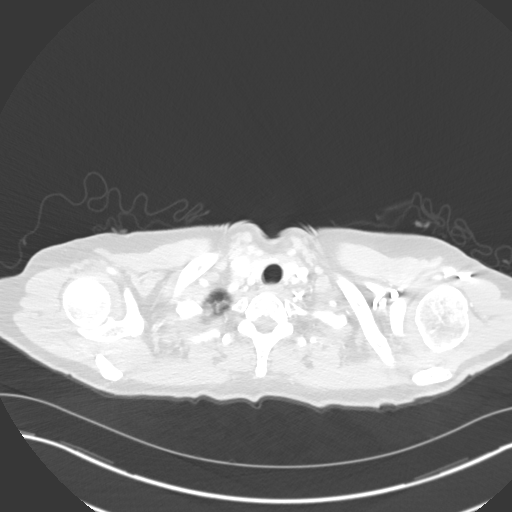

[Series 6: coronal · coronal · 0.71mm/px · 3 of 122 slices shown]
[im 25/122  lung]
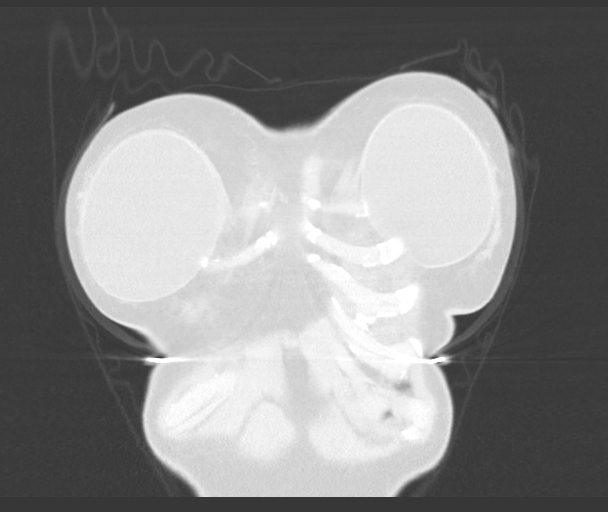
[im 49/122  lung]
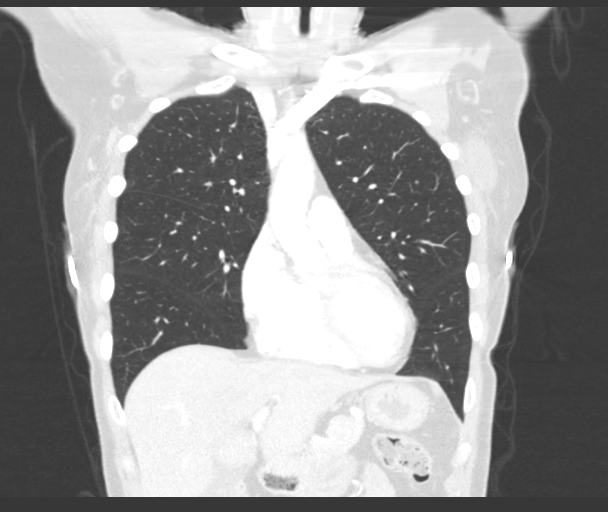
[im 73/122  lung]
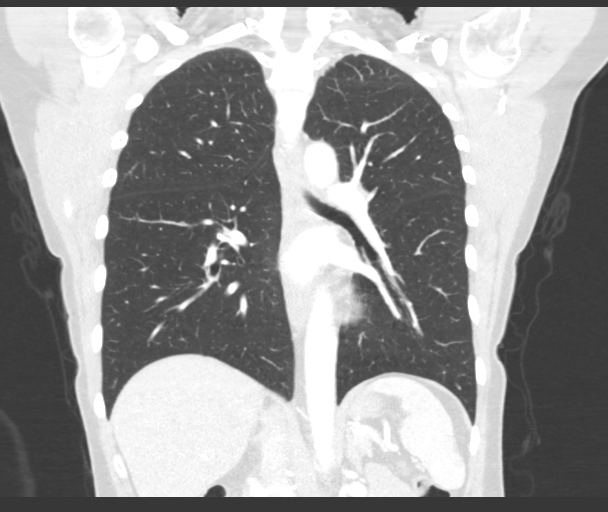

[15 of 36 positions shown; findings below may reference images not displayed]

FINDINGS: Cardiovascular: Normal caliber thoracic aorta. Normal heart size
without substantial pericardial effusion. Central pulmonary
vasculature normal caliber.

Mediastinum/Nodes: Thoracic inlet structures are normal.

No axillary lymphadenopathy. Mildly prominent lymph nodes are seen
bilaterally without pathologic enlargement, largest on the RIGHT
approximately 7 mm on image 57 of series 2. Largest on the LEFT of
similar size. Lymph nodes retain fatty hila. Is esophagus grossly
normal. The no mediastinal adenopathy. No hilar adenopathy.

Lungs/Pleura: Lungs are clear.  Airways are patent.

Upper Abdomen: Imaged portions of liver, gallbladder, pancreas,
adrenal glands and kidneys are unremarkable.

Wedge-shaped area of hypoattenuation in the spleen with signs of
marginal calcification along the internal margin of this area of
hypodensity seen in the spleen with no adjacent stranding. Signs of
a splenule adjacent to the splenic hilum. No acute gastrointestinal
process on limited evaluation of the upper abdomen.

Musculoskeletal: Bilateral breast implants are in place. Soft tissue
thickening with biopsy marker in place overlying the RIGHT lateral
breast likely the site of tumor. Small lymph node with biopsy marker
in place is the node measured above in the RIGHT axilla. No
suspicious bone lesion or destructive bone findings.
IMPRESSION: 1. Soft tissue thickening with biopsy marker in place overlying the
RIGHT lateral breast likely the site of tumor. Small lymph node with
biopsy marker in place is the node measured above in the RIGHT
axilla. No signs of nodal enlargement with pathologic features by CT
criteria.
2. Wedge-shaped area of hypoattenuation in the peripheral spleen is
compatible with splenic infarct, favored to represent sequela of
previous and or chronic infarct given calcifications albeit subtle
along the deep margin. Correlate with any upper abdominal symptoms.
There are no priors for comparison area measures approximately 1.7 x
1.4 cm in greatest axial dimension.

These results will be called to the ordering clinician or
representative by the Radiologist Assistant, and communication
documented in the PACS or [REDACTED].

## 2022-04-21 ENCOUNTER — Other Ambulatory Visit (HOSPITAL_COMMUNITY): Payer: Self-pay

## 2022-04-21 ENCOUNTER — Inpatient Hospital Stay: Payer: No Typology Code available for payment source | Admitting: Pharmacist

## 2022-04-21 ENCOUNTER — Other Ambulatory Visit: Payer: Self-pay | Admitting: Pharmacist

## 2022-04-21 DIAGNOSIS — Z170 Estrogen receptor positive status [ER+]: Secondary | ICD-10-CM

## 2022-04-21 DIAGNOSIS — C50411 Malignant neoplasm of upper-outer quadrant of right female breast: Secondary | ICD-10-CM

## 2022-04-21 IMAGING — CR DG CHEST 1V PORT
1 series · 1 of 1 positions shown · non-contrast
Comparison: 06/21/2008 CT chest 09/14/2020

CLINICAL DATA: Postop Port-A-Cath

EXAM:
PORTABLE CHEST 1 VIEW

[chest ap]
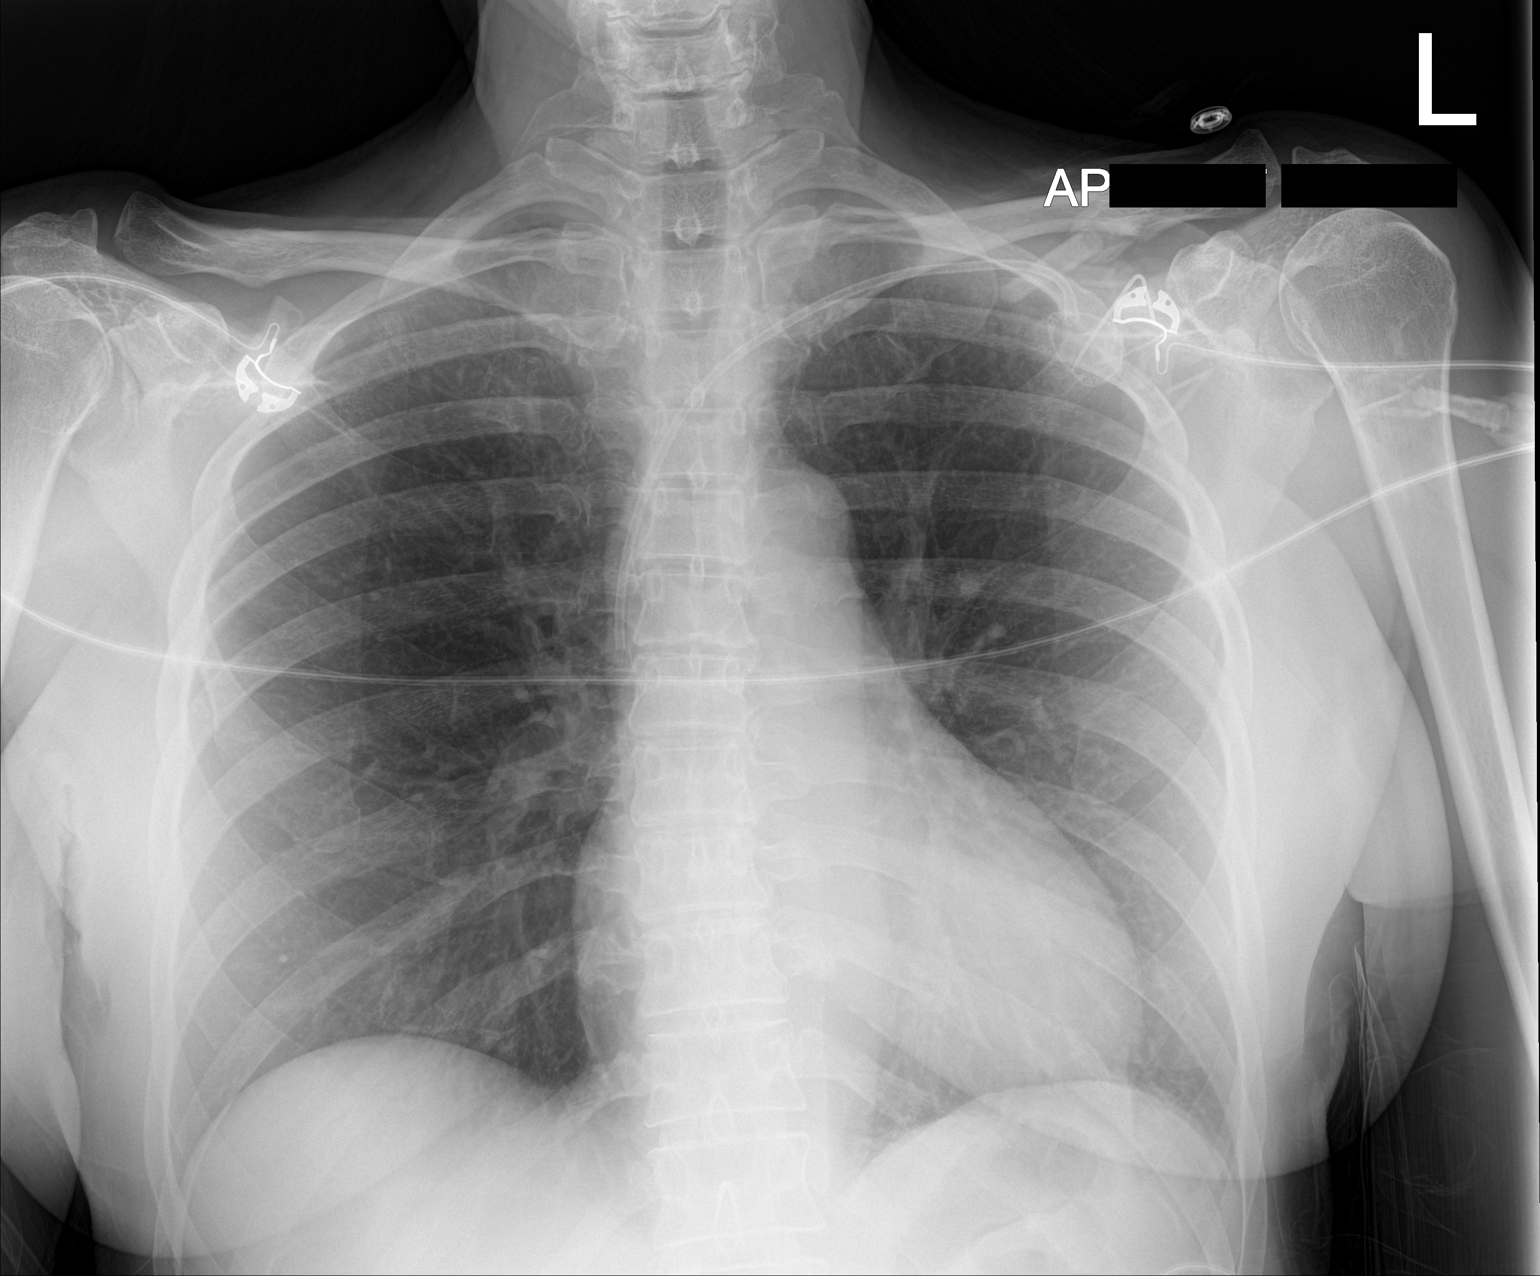

[1 of 1 positions shown; findings below may reference images not displayed]

FINDINGS: Left-sided central venous port tip over the SVC. No pneumothorax is
visualized. There is no focal airspace disease or effusion. The
cardiac size is normal
IMPRESSION: Left-sided central venous port tip over the SVC. Lungs clear. No
visible pneumothorax.

## 2022-04-21 MED ORDER — ABEMACICLIB 50 MG PO TABS
50.0000 mg | ORAL_TABLET | Freq: Two times a day (BID) | ORAL | 3 refills | Status: DC
  Filled 2022-04-21 (×2): qty 56, 28d supply, fill #0
  Filled 2022-05-12: qty 56, 28d supply, fill #1
  Filled 2022-06-11 – 2022-06-12 (×2): qty 56, 28d supply, fill #2
  Filled 2022-07-10: qty 56, 28d supply, fill #3

## 2022-04-21 NOTE — Progress Notes (Addendum)
[place]       Telephone: [phone]?Fax: [phone]   Oncology Clinical Pharmacist Practitioner Progress Note  Melissa Mcgrath is a 61 y.o. female with a diagnosis of breast cancer currently on adjuvant abemaciclib under the care of Dr. Nicholas Lose.   I connected with Melissa Mcgrath today by telephone and verified that I was speaking with the correct person using two patient identifiers.   Other persons participating in the visit and their role in the encounter: Gilford Rile, PharmD, BCOP, CPP   Patient's location: home  Provider's location: clinic  Patient contacted the pharmacy about diarrhea associated with a dose increase of Verzenio (abemaciclib) from '50mg'$  BID to '100mg'$  BID on 03/16/2022. She states it has been particularly bad the last week. She has not taken any loperamide. Her diarrhea was relieved only by stopping abemaciclib on Sunday, 04/20/2022 (missed dose total 4).   She states she has also been nauseous, but has not needed to take antiemetics.  Melissa Mcgrath dose of abemaciclib was reduced to '50mg'$  BID, starting tomorrow, 04/22/2022. A new prescription was entered for the '50mg'$  tablets. She was reminded that she can take loperamide when diarrhea occurs and her prescribed antiemetics when she feels nauseous.  Next follow appointments include: 05/19/2022 with Wilber Bihari, NP 07/16/2022 with Dr. Lindi Adie April 2024 with Gilford Rile, PharmD, BCOP, [place] participated in the discussion, expressed understanding, and voiced agreement with the above plan. All questions were answered to her satisfaction. The patient was advised to contact the clinic at [phone] with any questions or concerns prior to her return visit.   Clinical pharmacy will continue to support Melissa Mcgrath and Dr. Nicholas Lose as needed.  Karmen Stabs, RPH,  04/21/2022  1:27 PM   **Disclaimer: This note was dictated with voice recognition software.  Similar sounding words can inadvertently be transcribed and this note may contain transcription errors which may not have been corrected upon publication of note.**

## 2022-05-09 ENCOUNTER — Other Ambulatory Visit (HOSPITAL_COMMUNITY): Payer: Self-pay

## 2022-05-12 ENCOUNTER — Other Ambulatory Visit (HOSPITAL_COMMUNITY): Payer: Self-pay

## 2022-05-19 ENCOUNTER — Inpatient Hospital Stay: Payer: No Typology Code available for payment source | Attending: Adult Health

## 2022-05-19 ENCOUNTER — Inpatient Hospital Stay (HOSPITAL_BASED_OUTPATIENT_CLINIC_OR_DEPARTMENT_OTHER): Payer: No Typology Code available for payment source | Admitting: Adult Health

## 2022-05-19 ENCOUNTER — Encounter: Payer: Self-pay | Admitting: Adult Health

## 2022-05-19 ENCOUNTER — Other Ambulatory Visit: Payer: Self-pay

## 2022-05-19 ENCOUNTER — Other Ambulatory Visit (HOSPITAL_COMMUNITY): Payer: Self-pay

## 2022-05-19 VITALS — BP 114/33 | HR 80 | Temp 98.1°F | Resp 16 | Ht 68.0 in | Wt 136.1 lb

## 2022-05-19 DIAGNOSIS — C50411 Malignant neoplasm of upper-outer quadrant of right female breast: Secondary | ICD-10-CM | POA: Diagnosis not present

## 2022-05-19 DIAGNOSIS — Z9013 Acquired absence of bilateral breasts and nipples: Secondary | ICD-10-CM | POA: Insufficient documentation

## 2022-05-19 DIAGNOSIS — Z170 Estrogen receptor positive status [ER+]: Secondary | ICD-10-CM | POA: Diagnosis not present

## 2022-05-19 DIAGNOSIS — Z9221 Personal history of antineoplastic chemotherapy: Secondary | ICD-10-CM | POA: Diagnosis not present

## 2022-05-19 DIAGNOSIS — Z79811 Long term (current) use of aromatase inhibitors: Secondary | ICD-10-CM | POA: Diagnosis not present

## 2022-05-19 DIAGNOSIS — Z923 Personal history of irradiation: Secondary | ICD-10-CM | POA: Diagnosis not present

## 2022-05-19 LAB — CBC WITH DIFFERENTIAL (CANCER CENTER ONLY)
Abs Immature Granulocytes: 0 10*3/uL (ref 0.00–0.07)
Basophils Absolute: 0.1 10*3/uL (ref 0.0–0.1)
Basophils Relative: 3 %
Eosinophils Absolute: 0.1 10*3/uL (ref 0.0–0.5)
Eosinophils Relative: 4 %
HCT: 33 % — ABNORMAL LOW (ref 36.0–46.0)
Hemoglobin: 11.4 g/dL — ABNORMAL LOW (ref 12.0–15.0)
Immature Granulocytes: 0 %
Lymphocytes Relative: 24 %
Lymphs Abs: 0.7 10*3/uL (ref 0.7–4.0)
MCH: 35.7 pg — ABNORMAL HIGH (ref 26.0–34.0)
MCHC: 34.5 g/dL (ref 30.0–36.0)
MCV: 103.4 fL — ABNORMAL HIGH (ref 80.0–100.0)
Monocytes Absolute: 0.3 10*3/uL (ref 0.1–1.0)
Monocytes Relative: 12 %
Neutro Abs: 1.6 10*3/uL — ABNORMAL LOW (ref 1.7–7.7)
Neutrophils Relative %: 57 %
Platelet Count: 213 10*3/uL (ref 150–400)
RBC: 3.19 MIL/uL — ABNORMAL LOW (ref 3.87–5.11)
RDW: 13.1 % (ref 11.5–15.5)
WBC Count: 2.8 10*3/uL — ABNORMAL LOW (ref 4.0–10.5)
nRBC: 0 % (ref 0.0–0.2)

## 2022-05-19 LAB — CMP (CANCER CENTER ONLY)
ALT: 11 U/L (ref 0–44)
AST: 15 U/L (ref 15–41)
Albumin: 4.3 g/dL (ref 3.5–5.0)
Alkaline Phosphatase: 78 U/L (ref 38–126)
Anion gap: 4 — ABNORMAL LOW (ref 5–15)
BUN: 12 mg/dL (ref 8–23)
CO2: 29 mmol/L (ref 22–32)
Calcium: 9.9 mg/dL (ref 8.9–10.3)
Chloride: 106 mmol/L (ref 98–111)
Creatinine: 1.12 mg/dL — ABNORMAL HIGH (ref 0.44–1.00)
GFR, Estimated: 56 mL/min — ABNORMAL LOW (ref >60)
Glucose, Bld: 96 mg/dL (ref 70–99)
Potassium: 4.3 mmol/L (ref 3.5–5.1)
Sodium: 139 mmol/L (ref 135–145)
Total Bilirubin: 0.5 mg/dL (ref 0.3–1.2)
Total Protein: 7.2 g/dL (ref 6.5–8.1)

## 2022-05-19 LAB — SAVE SMEAR(SSMR), FOR PROVIDER SLIDE REVIEW

## 2022-05-19 NOTE — Assessment & Plan Note (Signed)
Melissa Mcgrath is a 61 year old woman with history of stage IIIa ER/PR positive breast cancer diagnosed in March 2022 status post neoadjuvant chemotherapy followed by bilateral mastectomies, adjuvant radiation, letrozole beginning in January 2023 with Verzenio twice daily added in May 2023.  Melissa Mcgrath continues on letrozole daily.  She tells me she continues to tolerate this well with no significant issues.  The Verzenio however she has struggled with getting up to any dose higher than 50 mg p.o. twice daily and has opted to stay on that dose for quality-of-life reasons.  She maintains an active lifestyle and has no clinical signs of breast cancer recurrence today.  She will continue on her current medications and dosages and will return in about 8 to 10 weeks for labs and follow-up with Dr. Lindi Adie.

## 2022-05-19 NOTE — Progress Notes (Signed)
[facility] Follow up:    Pablo Lawrence, NP [address] [place] [address]   DIAGNOSIS:  Cancer Staging  Malignant neoplasm of upper-outer quadrant of right breast in female, estrogen receptor positive ([place]) Staging form: Breast, AJCC 8th Edition - Clinical stage from 08/29/2020: Stage IIIA (cT2, cN2, cM0, G3, ER+, PR+, HER2-) - Signed by Gardenia Phlegm, NP on 09/07/2020 Stage prefix: Initial diagnosis Stage used in treatment planning: Yes National guidelines used in treatment planning: Yes Type of national guideline used in treatment planning: NCCN   SUMMARY OF ONCOLOGIC HISTORY: Oncology History  Malignant neoplasm of upper-outer quadrant of right breast in female, estrogen receptor positive ([place])  08/21/2020 Initial Diagnosis   right breast upper outer quadrant biopsy 08/21/2020 for a clinical mT2 N1, stage IIA/B invasive ductal carcinoma, grade 3, estrogen receptor positive, progesterone receptor weakly positive, HER-2 not amplified, with an MIB-1 of 40%   08/29/2020 Cancer Staging   Staging form: Breast, AJCC 8th Edition - Clinical stage from 08/29/2020: Stage IIIA (cT2, cN2, cM0, G3, ER+, PR+, HER2-) - Signed by Gardenia Phlegm, NP on 09/07/2020 Stage prefix: Initial diagnosis Stage used in treatment planning: Yes National guidelines used in treatment planning: Yes Type of national guideline used in treatment planning: NCCN   09/18/2020 - 03/01/2021 Chemotherapy   AC X 4 foll by Taxol X 12    09/18/2020 Genetic Testing   Negative genetic testing:  No pathogenic variants detected on the [facility] CancerNext-Expanded + RNAinsight panel. The report date is 09/18/2020.   The CancerNext-Expanded + RNAinsight gene panel offered by [employer] and includes sequencing and rearrangement analysis for the following 77 genes: AIP, ALK, APC, ATM, AXIN2, BAP1, BARD1, BLM, BMPR1A, BRCA1, BRCA2, BRIP1, CDC73, CDH1, CDK4, CDKN1B, CDKN2A, CHEK2,  CTNNA1, DICER1, FANCC, FH, FLCN, GALNT12, KIF1B, LZTR1, MAX, MEN1, MET, MLH1, MSH2, MSH3, MSH6, MUTYH, NBN, NF1, NF2, NTHL1, PALB2, PHOX2B, PMS2, POT1, PRKAR1A, PTCH1, PTEN, RAD51C, RAD51D, RB1, RECQL, RET, SDHA, SDHAF2, SDHB, SDHC, SDHD, SMAD4, SMARCA4, SMARCB1, SMARCE1, STK11, SUFU, TMEM127, TP53, TSC1, TSC2, VHL and XRCC2 (sequencing and deletion/duplication); EGFR, EGLN1, HOXB13, KIT, MITF, PDGFRA, POLD1 and POLE (sequencing only); EPCAM and GREM1 (deletion/duplication only). RNA data is routinely analyzed for use in variant interpretation for all genes.    Surgery   bilateral mastectomies with right axillary lymph nodes dissection on 04/25/2021 showing (A) on the left, no evidence of malignancy (B) on the right a residual ypT1c ypN0 invasive ductal carcinoma, grade 3, with a focally positive deep margin a total of 24 right axillary and 2 right intramammary lymph nodes were removed, all clear   06/12/2021 - 07/24/2021 Radiation Therapy   Adj XRT   06/2021 -  Anti-estrogen oral therapy   Letrozole beginning 06/2021; Verzenio added 11/09/2021     CURRENT THERAPY: Letrozole and Verzenio  INTERVAL HISTORY: NINA MONDOR 61 y.o. female returns for f/u of her breast cancer.  She is taking Letrozole daily.  She tolerates the letrozole well.  She attempted Verzenio at the increased dose, but couldn't tolerate the side effects.  Now she is back on 36m po bid and she is feeling much better on this dose.    GCyriahis going out of town for a horse show in the next 1-2 days and is looking forward to time away.  She has stayed active and is doing well with her activities, reporting some aching in her shoulder from time to time.  Patient Active Problem List   Diagnosis Date Noted   Genetic testing 09/18/2020   History of augmentation mammoplasty 08/31/2020   Family history of breast cancer    Family history of pancreatic cancer    Family history of melanoma    Family history of ovarian cancer     Malignant neoplasm of upper-outer quadrant of right breast in female, estrogen receptor positive ([place]) 08/28/2020   Vaginal atrophy 11/09/2014   Menopause 01/05/2014    is allergic to peanut-containing drug products.  MEDICAL HISTORY: Past Medical History:  Diagnosis Date   Anemia    Depression 01/05/2014   Facial basal cell cancer    Family history of breast cancer    Family history of melanoma    Family history of ovarian cancer    Family history of pancreatic cancer    History of radiation therapy    right chest wall and subclavian 06/11/2021-07/26/2021  Dr Gery Pray   Menopause 01/05/2014   Port-A-Cath in place 10/04/2020   Vaginal atrophy 11/09/2014    SURGICAL HISTORY: Past Surgical History:  Procedure Laterality Date   BILATERAL TOTAL MASTECTOMY WITH AXILLARY LYMPH NODE DISSECTION Right 04/25/2021   Procedure: RIGHT TOTAL MASTECTOMY WITH RIGHT AXILLARY LYMPH NODE DISSECTION;  Surgeon: Stark Klein, MD;  Location: [facility];  Service: General;  Laterality: Right;   BREAST ENHANCEMENT SURGERY     BREAST IMPLANT REMOVAL Bilateral 04/25/2021   Procedure: REMOVAL BREAST IMPLANTS;  Surgeon: Stark Klein, MD;  Location: [facility];  Service: General;  Laterality: Bilateral;   CARPAL TUNNEL RELEASE Right    PORT-A-CATH REMOVAL N/A 10/15/2021   Procedure: REMOVAL PORT-A-CATH;  Surgeon: Stark Klein, MD;  Location: [facility];  Service: General;  Laterality: N/A;   PORTACATH PLACEMENT N/A 09/17/2020   Procedure: INSERTION PORT-A-CATH;  Surgeon: Stark Klein, MD;  Location: [facility];  Service: General;  Laterality: N/A;   refractive lensectomy Bilateral    SCAR REVISION Left 10/15/2021   Procedure: REVISION OF LEFT MASTECTOMY SCAR;  Surgeon: Stark Klein, MD;  Location: [facility];  Service: General;  Laterality: Left;   TOTAL MASTECTOMY Left 04/25/2021   Procedure: LEFT TOTAL MASTECTOMY;  Surgeon: Stark Klein, MD;  Location: [facility];  Service: General;  Laterality: Left;     SOCIAL HISTORY: Social History   Socioeconomic History   Marital status: Married    Spouse name: Not on file   Number of children: Not on file   Years of education: Not on file   Highest education level: Not on file  Occupational History   Not on file  Tobacco Use   Smoking status: Never   Smokeless tobacco: Never  Vaping Use   Vaping Use: Never used  Substance and Sexual Activity   Alcohol use: No   Drug use: No   Sexual activity: Not Currently    Birth control/protection: Post-menopausal  Other Topics Concern   Not on file  Social History Narrative   Married 23 years,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.   Social Determinants of Health   Financial Resource Strain: Low Risk  (08/29/2020)   Overall Financial Resource Strain (CARDIA)    Difficulty of Paying Living Expenses: Not very hard  Food Insecurity: No Food Insecurity (08/29/2020)   Hunger Vital Sign    Worried About Running Out of Food in the Last Year: Never true    Ran Out of Food in the Last Year: Never true  Transportation Needs: No Transportation Needs (08/29/2020)   PRAPARE - Transportation  Lack of Transportation (Medical): No    Lack of Transportation (Non-Medical): No  Physical Activity: Not on file  Stress: Not on file  Social Connections: Not on file  Intimate Partner Violence: Not on file    FAMILY HISTORY: Family History  Problem Relation Age of Onset   Breast cancer Mother 47       breast   Hypertension Father    Heart attack Father    Alzheimer's disease Father    Fibromyalgia Sister    Diabetes Sister    Heart attack Sister    Hypertension Sister    Hypertension Brother    Diabetes Brother    Pancreatic cancer Maternal Grandmother 83       Pancreatic   Melanoma Paternal Uncle        dx 76s   Melanoma Paternal Uncle        dx 47s   Ovarian cancer Paternal Aunt        dx 50s/60s    Review of Systems  Constitutional:  Negative for appetite change, chills, fatigue,  fever and unexpected weight change.  HENT:   Negative for hearing loss, lump/mass and trouble swallowing.   Eyes:  Negative for eye problems and icterus.  Respiratory:  Negative for chest tightness, cough and shortness of breath.   Cardiovascular:  Negative for chest pain, leg swelling and palpitations.  Gastrointestinal:  Negative for abdominal distention, abdominal pain, constipation, diarrhea, nausea and vomiting.  Endocrine: Negative for hot flashes.  Genitourinary:  Negative for difficulty urinating.   Musculoskeletal:  Negative for arthralgias.  Skin:  Negative for itching and rash.  Neurological:  Negative for dizziness, extremity weakness, headaches and numbness.  Hematological:  Negative for adenopathy. Does not bruise/bleed easily.  Psychiatric/Behavioral:  Negative for depression. The patient is not nervous/anxious.       PHYSICAL EXAMINATION  ECOG PERFORMANCE STATUS: 1 - Symptomatic but completely ambulatory  Vitals:   05/19/22 1150  BP: (!) 114/33  Pulse: 80  Resp: 16  Temp: 98.1 F (36.7 C)  SpO2: 100%    Physical Exam Constitutional:      General: She is not in acute distress.    Appearance: Normal appearance. She is not toxic-appearing.  HENT:     Head: Normocephalic and atraumatic.  Eyes:     General: No scleral icterus. Cardiovascular:     Rate and Rhythm: Normal rate and regular rhythm.     Pulses: Normal pulses.     Heart sounds: Normal heart sounds.  Pulmonary:     Effort: Pulmonary effort is normal.     Breath sounds: Normal breath sounds.  Chest:     Comments: Status post bilateral mastectomies, no sign of local recurrence. Abdominal:     General: Abdomen is flat. Bowel sounds are normal. There is no distension.     Palpations: Abdomen is soft.     Tenderness: There is no abdominal tenderness.  Musculoskeletal:        General: No swelling.     Cervical back: Neck supple.  Lymphadenopathy:     Cervical: No cervical adenopathy.  Skin:     General: Skin is warm and dry.     Findings: No rash.  Neurological:     General: No focal deficit present.     Mental Status: She is alert.  Psychiatric:        Mood and Affect: Mood normal.        Behavior: Behavior normal.     LABORATORY DATA:  CBC    Component Value Date/Time   WBC 2.8 (L) 05/19/2022 1137   WBC 4.8 10/10/2021 1122   RBC 3.19 (L) 05/19/2022 1137   HGB 11.4 (L) 05/19/2022 1137   HCT 33.0 (L) 05/19/2022 1137   PLT 213 05/19/2022 1137   MCV 103.4 (H) 05/19/2022 1137   MCH 35.7 (H) 05/19/2022 1137   MCHC 34.5 05/19/2022 1137   RDW 13.1 05/19/2022 1137   LYMPHSABS 0.7 05/19/2022 1137   MONOABS 0.3 05/19/2022 1137   EOSABS 0.1 05/19/2022 1137   BASOSABS 0.1 05/19/2022 1137    CMP     Component Value Date/Time   NA 139 05/19/2022 1137   K 4.3 05/19/2022 1137   CL 106 05/19/2022 1137   CO2 29 05/19/2022 1137   GLUCOSE 96 05/19/2022 1137   BUN 12 05/19/2022 1137   CREATININE 1.12 (H) 05/19/2022 1137   CREATININE 0.76 12/20/2013 1207   CALCIUM 9.9 05/19/2022 1137   PROT 7.2 05/19/2022 1137   ALBUMIN 4.3 05/19/2022 1137   AST 15 05/19/2022 1137   ALT 11 05/19/2022 1137   ALKPHOS 78 05/19/2022 1137   BILITOT 0.5 05/19/2022 1137   GFRNONAA 56 (L) 05/19/2022 1137        ASSESSMENT and THERAPY PLAN:   Malignant neoplasm of upper-outer quadrant of right breast in female, estrogen receptor positive (H[facility]) Chris is a 61 year old woman with history of stage IIIa ER/PR positive breast cancer diagnosed in March 2022 status post neoadjuvant chemotherapy followed by bilateral mastectomies, adjuvant radiation, letrozole beginning in January 2023 with Verzenio twice daily added in May 2023.  Labrisha continues on letrozole daily.  She tells me she continues to tolerate this well with no significant issues.  The Verzenio however she has struggled with getting up to any dose higher than 50 mg p.o. twice daily and has opted to stay on that dose for quality-of-life  reasons.  She maintains an active lifestyle and has no clinical signs of breast cancer recurrence today.  She will continue on her current medications and dosages and will return in about 8 to 10 weeks for labs and follow-up with Dr. Lindi Adie.   All questions were answered. The patient knows to call the clinic with any problems, questions or concerns. We can certainly see the patient much sooner if necessary.  Total encounter time:20 minutes*in face-to-face visit time, chart review, lab review, care coordination, order entry, and documentation of the encounter time.    Wilber Bihari, NP 05/19/22 3:47 PM Medical Oncology and Hematology [facility] [place], [place] [address] Tel. [phone]    Fax. [phone]  *Total Encounter Time as defined by the Centers for Medicare and Medicaid Services includes, in addition to the face-to-face time of a patient visit (documented in the note above) non-face-to-face time: obtaining and reviewing outside history, ordering and reviewing medications, tests or procedures, care coordination (communications with other health care professionals or caregivers) and documentation in the medical record.

## 2022-05-20 ENCOUNTER — Telehealth: Payer: Self-pay | Admitting: *Deleted

## 2022-05-20 NOTE — Telephone Encounter (Signed)
Pt called asking about lab results from yesterday.  Informed that everything looks stable.  She is planning to go to [facility].

## 2022-06-10 ENCOUNTER — Other Ambulatory Visit: Payer: Self-pay

## 2022-06-11 ENCOUNTER — Other Ambulatory Visit (HOSPITAL_COMMUNITY): Payer: Self-pay

## 2022-06-12 ENCOUNTER — Other Ambulatory Visit: Payer: Self-pay

## 2022-06-13 ENCOUNTER — Other Ambulatory Visit (HOSPITAL_COMMUNITY): Payer: Self-pay

## 2022-06-13 ENCOUNTER — Other Ambulatory Visit: Payer: Self-pay

## 2022-06-17 ENCOUNTER — Ambulatory Visit
Admission: EM | Admit: 2022-06-17 | Discharge: 2022-06-17 | Disposition: A | Discharge status: Home / Self Care | Payer: No Typology Code available for payment source | Attending: Internal Medicine | Admitting: Internal Medicine

## 2022-06-17 ENCOUNTER — Other Ambulatory Visit (HOSPITAL_COMMUNITY): Payer: Self-pay

## 2022-06-17 ENCOUNTER — Other Ambulatory Visit: Payer: Self-pay

## 2022-06-17 DIAGNOSIS — J0300 Acute streptococcal tonsillitis, unspecified: Secondary | ICD-10-CM

## 2022-06-17 LAB — POCT RAPID STREP A (OFFICE): Rapid Strep A Screen: POSITIVE — AB

## 2022-06-17 MED ORDER — AMOXICILLIN 875 MG PO TABS: 875.0000 mg | ORAL_TABLET | Freq: Two times a day (BID) | ORAL | 0 refills | Status: DC

## 2022-06-17 NOTE — ED Provider Notes (Signed)
[facility]    CSN: [id number] Arrival date & time: 06/17/22  1754      History   Chief Complaint Chief Complaint  Patient presents with   Sore Throat   Fever    HPI Melissa Mcgrath is a 61 y.o. female.   Presenting today with going on 2 days of sore, swollen feeling throat, body aches, headaches, fevers.  Denies congestion, cough, chest pain, shortness of breath, abdominal pain, nausea vomiting or diarrhea.  So far trying Tylenol with minimal relief.  No known sick contacts recently.    Past Medical History:  Diagnosis Date   Anemia    Depression 01/05/2014   Facial basal cell cancer    Family history of breast cancer    Family history of melanoma    Family history of ovarian cancer    Family history of pancreatic cancer    History of radiation therapy    right chest wall and subclavian 06/11/2021-07/26/2021  Dr Gery Pray   Menopause 01/05/2014   Port-A-Cath in place 10/04/2020   Vaginal atrophy 11/09/2014    Patient Active Problem List   Diagnosis Date Noted   Genetic testing 09/18/2020   History of augmentation mammoplasty 08/31/2020   Family history of breast cancer    Family history of pancreatic cancer    Family history of melanoma    Family history of ovarian cancer    Malignant neoplasm of upper-outer quadrant of right breast in female, estrogen receptor positive ([place]) 08/28/2020   Vaginal atrophy 11/09/2014   Menopause 01/05/2014    Past Surgical History:  Procedure Laterality Date   BILATERAL TOTAL MASTECTOMY WITH AXILLARY LYMPH NODE DISSECTION Right 04/25/2021   Procedure: RIGHT TOTAL MASTECTOMY WITH RIGHT AXILLARY LYMPH NODE DISSECTION;  Surgeon: Stark Klein, MD;  Location: [facility];  Service: General;  Laterality: Right;   BREAST ENHANCEMENT SURGERY     BREAST IMPLANT REMOVAL Bilateral 04/25/2021   Procedure: REMOVAL BREAST IMPLANTS;  Surgeon: Stark Klein, MD;  Location: [facility];  Service: General;  Laterality: Bilateral;   CARPAL  TUNNEL RELEASE Right    PORT-A-CATH REMOVAL N/A 10/15/2021   Procedure: REMOVAL PORT-A-CATH;  Surgeon: Stark Klein, MD;  Location: [facility];  Service: General;  Laterality: N/A;   PORTACATH PLACEMENT N/A 09/17/2020   Procedure: INSERTION PORT-A-CATH;  Surgeon: Stark Klein, MD;  Location: [facility];  Service: General;  Laterality: N/A;   refractive lensectomy Bilateral    SCAR REVISION Left 10/15/2021   Procedure: REVISION OF LEFT MASTECTOMY SCAR;  Surgeon: Stark Klein, MD;  Location: [facility];  Service: General;  Laterality: Left;   TOTAL MASTECTOMY Left 04/25/2021   Procedure: LEFT TOTAL MASTECTOMY;  Surgeon: Stark Klein, MD;  Location: [facility];  Service: General;  Laterality: Left;    OB History     Gravida  4   Para  2   Term      Preterm      AB  2   Living  2      SAB  2   IAB      Ectopic      Multiple      Live Births               Home Medications    Prior to Admission medications   Medication Sig Start Date End Date Taking? Authorizing Provider  abemaciclib (VERZENIO) 50 MG tablet Take 1 tablet (50 mg total) by mouth 2 (two) times daily. Swallow tablets whole. Do  not chew, crush, or split tablets before swallowing. 04/21/22  Yes Nicholas Lose, MD  acetaminophen (TYLENOL) 650 MG CR tablet Take 650 mg by mouth every 8 (eight) hours as needed for pain.   Yes [provider]  amoxicillin (AMOXIL) 875 MG tablet Take 1 tablet (875 mg total) by mouth 2 (two) times daily. 06/17/22  Yes Volney American, PA-C  letrozole [facility]) 2.5 MG tablet Take 1 tablet (2.5 mg total) by mouth daily. 07/10/21  Yes Nicholas Lose, MD  valACYclovir (VALTREX) 500 MG tablet Take 500 mg by mouth 2 (two) times daily as needed (for fever bilsters outbreak).   Yes [provider]    Family History Family History  Problem Relation Age of Onset   Breast cancer Mother 77       breast   Hypertension Father    Heart attack Father    Alzheimer's  disease Father    Fibromyalgia Sister    Diabetes Sister    Heart attack Sister    Hypertension Sister    Hypertension Brother    Diabetes Brother    Pancreatic cancer Maternal Grandmother 83       Pancreatic   Melanoma Paternal Uncle        dx 74s   Melanoma Paternal Uncle        dx 74s   Ovarian cancer Paternal Aunt        dx 50s/60s    Social History Social History   Tobacco Use   Smoking status: Never   Smokeless tobacco: Never  Vaping Use   Vaping Use: Never used  Substance Use Topics   Alcohol use: No   Drug use: No     Allergies   Peanut-containing drug products   Review of Systems Review of Systems PER HPI  Physical Exam Triage Vital Signs ED Triage Vitals  Enc Vitals Group     BP 06/17/22 1905 102/63     Pulse Rate 06/17/22 1905 (!) 110     Resp 06/17/22 1905 16     Temp 06/17/22 1905 99 F (37.2 C)     Temp Source 06/17/22 1905 Oral     SpO2 06/17/22 1905 99 %     Weight --      Height --      Head Circumference --      Peak Flow --      Pain Score 06/17/22 1915 0     Pain Loc --      Pain Edu? --      Excl. in [place]? --    No data found.  Updated Vital Signs BP 102/63 (BP Location: Right Arm)   Pulse (!) 110   Temp 99 F (37.2 C) (Oral)   Resp 16   SpO2 99%   Visual Acuity Right Eye Distance:   Left Eye Distance:   Bilateral Distance:    Right Eye Near:   Left Eye Near:    Bilateral Near:     Physical Exam Vitals and nursing note reviewed.  Constitutional:      Appearance: Normal appearance.  HENT:     Head: Atraumatic.     Right Ear: Tympanic membrane and external ear normal.     Left Ear: Tympanic membrane and external ear normal.     Nose: Nose normal.     Mouth/Throat:     Mouth: Mucous membranes are moist.     Pharynx: Oropharyngeal exudate and posterior oropharyngeal erythema present.  Eyes:  Extraocular Movements: Extraocular movements intact.     Conjunctiva/sclera: Conjunctivae normal.  Cardiovascular:      Rate and Rhythm: Normal rate and regular rhythm.     Heart sounds: Normal heart sounds.  Pulmonary:     Effort: Pulmonary effort is normal.     Breath sounds: Normal breath sounds. No wheezing.  Musculoskeletal:        General: Normal range of motion.     Cervical back: Normal range of motion and neck supple.  Lymphadenopathy:     Cervical: Cervical adenopathy present.  Skin:    General: Skin is warm and dry.  Neurological:     Mental Status: She is alert and oriented to person, place, and time.  Psychiatric:        Mood and Affect: Mood normal.        Thought Content: Thought content normal.      UC Treatments / Results  Labs (all labs ordered are listed, but only abnormal results are displayed) Labs Reviewed  POCT RAPID STREP A (OFFICE) - Abnormal; Notable for the following components:      Result Value   Rapid Strep A Screen Positive (*)    All other components within normal limits    EKG   Radiology No results found.  Procedures Procedures (including critical care time)  Medications Ordered in UC Medications - No data to display  Initial Impression / Assessment and Plan / UC Course  I have reviewed the triage vital signs and the nursing notes.  Pertinent labs & imaging results that were available during my care of the patient were reviewed by me and considered in my medical decision making (see chart for details).     Rapid strep positive, treat with Amoxil, over-the-counter pain relievers, supportive home care.  Return for worsening symptoms.  Final Clinical Impressions(s) / UC Diagnoses   Final diagnoses:  Strep tonsillitis   Discharge Instructions   None    ED Prescriptions     Medication Sig Dispense Auth. Provider   amoxicillin (AMOXIL) 875 MG tablet Take 1 tablet (875 mg total) by mouth 2 (two) times daily. 20 tablet Volney American, [place]      PDMP not reviewed this encounter.   Volney American, [place] 06/17/22 1948

## 2022-06-17 NOTE — ED Triage Notes (Signed)
Pt presents with sore throat x 1.5 days and felt fevered. Took Tylenol 1600. + body aches, HA.

## 2022-07-10 ENCOUNTER — Other Ambulatory Visit (HOSPITAL_COMMUNITY): Payer: Self-pay

## 2022-07-14 ENCOUNTER — Other Ambulatory Visit (HOSPITAL_COMMUNITY): Payer: Self-pay

## 2022-07-15 ENCOUNTER — Telehealth: Payer: Self-pay | Admitting: Pharmacist

## 2022-07-15 NOTE — Telephone Encounter (Signed)
Contacted patient to scheduled appointments. Left message with appointment details and a call back number if patient had any questions or could not accommodate the time we provided.

## 2022-07-16 ENCOUNTER — Telehealth: Payer: Self-pay | Admitting: Hematology and Oncology

## 2022-07-16 NOTE — Telephone Encounter (Signed)
Rescheduled appointment per provider PAL. Patient is aware of the changes made to her upcoming appointments. 

## 2022-07-17 ENCOUNTER — Other Ambulatory Visit (HOSPITAL_COMMUNITY): Payer: Self-pay

## 2022-07-17 ENCOUNTER — Ambulatory Visit: Payer: No Typology Code available for payment source | Admitting: Pharmacist

## 2022-07-17 MED ORDER — ABEMACICLIB 50 MG PO TABS
50.0000 mg | ORAL_TABLET | Freq: Two times a day (BID) | ORAL | 5 refills | Status: DC
  Filled 2022-07-17: qty 56, 28d supply, fill #0

## 2022-07-17 NOTE — Progress Notes (Signed)
[facility]       Telephone: [phone]?Fax: [phone]   Oncology Clinical Pharmacist Practitioner Progress Note  Melissa Mcgrath is a 62 y.o. female with a diagnosis of breast cancer currently on abemaciclib and letrozole under the care of Dr. Nicholas Lose.   I connected with Melissa Mcgrath today by telephone and verified that I was speaking with the correct person using two patient identifiers.   Other persons participating in the visit and their role in the encounter: none   Patient's location: home  Provider's location: clinic  Melissa Mcgrath contacted clinical pharmacy today because her insurance will be changing as of 07/28/22. She was requesting a 90 day supply but unfortunately insurance will only allow a 30 day supply. Her prescription is out of refills so we will send a refill request.  We are also checking with the oral chemotherapy pharmacy team to see if she may qualify for additional assistance now that her and her husband are both retired and only have [employer] for income. They will contact her to try to assist.  Follow up appointments include - Labs with Dr. Lindi Adie visit on 08/08/22 - Labs with pharmacy clinic visit on 10/02/22  Melissa Mcgrath participated in the discussion, expressed understanding, and voiced agreement with the above plan. All questions were answered to her satisfaction. The patient was advised to contact the clinic at [phone] with any questions or concerns prior to her return visit.  Clinical pharmacy will continue to support Melissa Mcgrath and Dr. Nicholas Lose as needed.  Melissa Mcgrath, RPH-CPP,  07/17/2022  3:24 PM   **Disclaimer: This note was dictated with voice recognition software. Similar sounding words can inadvertently be transcribed and this note may contain transcription errors which may not have been corrected upon publication of note.**

## 2022-07-24 ENCOUNTER — Other Ambulatory Visit (HOSPITAL_COMMUNITY): Payer: Self-pay

## 2022-07-25 ENCOUNTER — Other Ambulatory Visit (HOSPITAL_COMMUNITY): Payer: Self-pay

## 2022-07-28 ENCOUNTER — Other Ambulatory Visit (HOSPITAL_COMMUNITY): Payer: Self-pay

## 2022-07-28 ENCOUNTER — Telehealth: Payer: Self-pay | Admitting: Pharmacy Technician

## 2022-07-28 NOTE — Telephone Encounter (Signed)
Oral Oncology Patient Advocate Encounter   Received notification that prior authorization for Verzenio is required.   PA submitted on 07/28/22 Key [id number] Status is pending     Melissa Mcgrath, CPhT-Adv Oncology Pharmacy Patient [place] Direct Number: [phone]  Fax: [phone]

## 2022-07-30 ENCOUNTER — Other Ambulatory Visit (HOSPITAL_COMMUNITY): Payer: Self-pay

## 2022-07-30 ENCOUNTER — Telehealth: Payer: Self-pay | Admitting: Pharmacy Technician

## 2022-07-30 NOTE — Telephone Encounter (Signed)
Oral Oncology Patient Advocate Encounter  Prior Authorization for Melissa Mcgrath has been approved.    PA# [id number] Effective dates: 07/30/22 through 07/31/23  Patient must fill at [facility].    Melissa Mcgrath, CPhT-Adv Oncology Pharmacy Patient [place] Direct Number: [phone]  Fax: [phone]

## 2022-07-30 NOTE — Telephone Encounter (Signed)
Oral Oncology Patient Advocate Encounter   Was successful in obtaining a copay card for Verzenio.  This copay card will make the patients copay $0.  I have spoken with the patient.    The billing information is as follows and has been shared with [facility].   RxBin: [id number] PCN: [id number] Member ID: [id number] Group ID: [id number]   Lady Deutscher, CPhT-Adv Oncology Pharmacy Patient [place] Direct Number: [phone]  Fax: [phone]

## 2022-07-31 ENCOUNTER — Telehealth: Payer: Self-pay

## 2022-07-31 ENCOUNTER — Other Ambulatory Visit: Payer: No Typology Code available for payment source

## 2022-07-31 ENCOUNTER — Ambulatory Visit: Payer: No Typology Code available for payment source | Admitting: Hematology and Oncology

## 2022-07-31 ENCOUNTER — Other Ambulatory Visit (HOSPITAL_COMMUNITY): Payer: Self-pay

## 2022-07-31 DIAGNOSIS — Z170 Estrogen receptor positive status [ER+]: Secondary | ICD-10-CM

## 2022-07-31 MED ORDER — ABEMACICLIB 50 MG PO TABS: 50.0000 mg | ORAL_TABLET | Freq: Two times a day (BID) | ORAL | 5 refills | Status: DC

## 2022-07-31 NOTE — Telephone Encounter (Signed)
Oral Oncology Pharmacist Encounter  Prescription refill for Verzenio sent to [facility] in error. Patients insurance [facility]. [facility].  Drema Halon, PharmD Hematology/Oncology Clinical Pharmacist [facility] [phone] 07/31/2022 1:06 PM

## 2022-08-06 ENCOUNTER — Other Ambulatory Visit: Payer: Self-pay | Admitting: *Deleted

## 2022-08-06 DIAGNOSIS — E2839 Other primary ovarian failure: Secondary | ICD-10-CM

## 2022-08-06 DIAGNOSIS — Z170 Estrogen receptor positive status [ER+]: Secondary | ICD-10-CM

## 2022-08-07 NOTE — Progress Notes (Signed)
Patient Care Team: Pablo Lawrence, NP as PCP - General (Adult Health Nurse Practitioner) Stark Klein, MD as Consulting Physician (General Surgery) Gery Pray, MD as Consulting Physician (Radiation Oncology) Dian Queen, MD as Consulting Physician (Obstetrics and Gynecology) Ulla Gallo, MD as Consulting Physician (Dermatology) Nicholas Lose, MD as Consulting Physician (Hematology and Oncology) Raina Mina, RPH-CPP as Pharmacist (Hematology and Oncology)  DIAGNOSIS:  Encounter Diagnosis  Name Primary?   Malignant neoplasm of upper-outer quadrant of right breast in female, estrogen receptor positive ([place]) Yes    SUMMARY OF ONCOLOGIC HISTORY: Oncology History  Malignant neoplasm of upper-outer quadrant of right breast in female, estrogen receptor positive ([place])  08/21/2020 Initial Diagnosis   right breast upper outer quadrant biopsy 08/21/2020 for a clinical mT2 N1, stage IIA/B invasive ductal carcinoma, grade 3, estrogen receptor positive, progesterone receptor weakly positive, HER-2 not amplified, with an MIB-1 of 40%   08/29/2020 Cancer Staging   Staging form: Breast, AJCC 8th Edition - Clinical stage from 08/29/2020: Stage IIIA (cT2, cN2, cM0, G3, ER+, PR+, HER2-) - Signed by Gardenia Phlegm, NP on 09/07/2020 Stage prefix: Initial diagnosis Stage used in treatment planning: Yes National guidelines used in treatment planning: Yes Type of national guideline used in treatment planning: NCCN   09/18/2020 - 03/01/2021 Chemotherapy   AC X 4 foll by Taxol X 12    09/18/2020 Genetic Testing   Negative genetic testing:  No pathogenic variants detected on the [employer] CancerNext-Expanded + RNAinsight panel. The report date is 09/18/2020.   The CancerNext-Expanded + RNAinsight gene panel offered by [employer] and includes sequencing and rearrangement analysis for the following 77 genes: AIP, ALK, APC, ATM, AXIN2, BAP1, BARD1, BLM, BMPR1A, BRCA1, BRCA2, BRIP1, CDC73,  CDH1, CDK4, CDKN1B, CDKN2A, CHEK2, CTNNA1, DICER1, FANCC, FH, FLCN, GALNT12, KIF1B, LZTR1, MAX, MEN1, MET, MLH1, MSH2, MSH3, MSH6, MUTYH, NBN, NF1, NF2, NTHL1, PALB2, PHOX2B, PMS2, POT1, PRKAR1A, PTCH1, PTEN, RAD51C, RAD51D, RB1, RECQL, RET, SDHA, SDHAF2, SDHB, SDHC, SDHD, SMAD4, SMARCA4, SMARCB1, SMARCE1, STK11, SUFU, TMEM127, TP53, TSC1, TSC2, VHL and XRCC2 (sequencing and deletion/duplication); EGFR, EGLN1, HOXB13, KIT, MITF, PDGFRA, POLD1 and POLE (sequencing only); EPCAM and GREM1 (deletion/duplication only). RNA data is routinely analyzed for use in variant interpretation for all genes.    Surgery   bilateral mastectomies with right axillary lymph nodes dissection on 04/25/2021 showing (A) on the left, no evidence of malignancy (B) on the right a residual ypT1c ypN0 invasive ductal carcinoma, grade 3, with a focally positive deep margin a total of 24 right axillary and 2 right intramammary lymph nodes were removed, all clear   06/12/2021 - 07/24/2021 Radiation Therapy   Adj XRT   06/2021 -  Anti-estrogen oral therapy   Letrozole beginning 06/2021; Verzenio added 11/09/2021     CHIEF COMPLIANT: estrogen receptor positive breast cancer  INTERVAL HISTORY: Melissa Mcgrath is a 62 y.o. with above-mentioned history of estrogen receptor positive breast cancer. Currently on letrozole and Verzenio.  She presents to the clinic today for a  follow-up. She reports that diarrhea has improve. She does get some random loose stools. She complains of shoulder and knee stiffness. She does have a lot of fatigue, mostly in the evenings. She says she does good in the mornings. She says she gets some itching in the right breast that wakes her up.   ALLERGIES:  is allergic to peanut-containing drug products.  MEDICATIONS:  Current Outpatient Medications  Medication Sig Dispense Refill   abemaciclib (VERZENIO) 50 MG tablet Take  1 tablet (50 mg total) by mouth 2 (two) times daily. Swallow tablets whole. Do not  chew, crush, or split tablets before swallowing. 56 tablet 5   acetaminophen (TYLENOL) 650 MG CR tablet Take 650 mg by mouth every 8 (eight) hours as needed for pain.     letrozole (FEMARA) 2.5 MG tablet Take 1 tablet (2.5 mg total) by mouth daily. 90 tablet 3   valACYclovir (VALTREX) 500 MG tablet Take 500 mg by mouth 2 (two) times daily as needed (for fever bilsters outbreak).     No current facility-administered medications for this visit.    PHYSICAL EXAMINATION: ECOG PERFORMANCE STATUS: 1 - Symptomatic but completely ambulatory  Vitals:   08/08/22 1120  BP: 113/63  Pulse: 85  Resp: 14  Temp: (!) 97.5 F (36.4 C)  SpO2: 100%   Filed Weights   08/08/22 1120  Weight: 137 lb 11.2 oz (62.5 kg)      LABORATORY DATA:  I have reviewed the data as listed    Latest Ref Rng & Units 05/19/2022   11:37 AM 03/31/2022   10:35 AM 02/06/2022    9:01 AM  CMP  Glucose 70 - 99 mg/dL 96  99  86   BUN 8 - 23 mg/dL 12  13  16   $ Creatinine 0.44 - 1.00 mg/dL 1.12  1.08  1.00   Sodium 135 - 145 mmol/L 139  140  142   Potassium 3.5 - 5.1 mmol/L 4.3  4.0  3.7   Chloride 98 - 111 mmol/L 106  107  108   CO2 22 - 32 mmol/L 29  29  27   $ Calcium 8.9 - 10.3 mg/dL 9.9  9.3  9.7   Total Protein 6.5 - 8.1 g/dL 7.2  7.1  7.5   Total Bilirubin 0.3 - 1.2 mg/dL 0.5  0.6  0.7   Alkaline Phos 38 - 126 U/L 78  76  77   AST 15 - 41 U/L 15  15  18   $ ALT 0 - 44 U/L 11  10  12     $ Lab Results  Component Value Date   WBC 3.2 (L) 08/08/2022   HGB 11.0 (L) 08/08/2022   HCT 31.5 (L) 08/08/2022   MCV 103.6 (H) 08/08/2022   PLT 210 08/08/2022   NEUTROABS 1.9 08/08/2022    ASSESSMENT & PLAN:  Malignant neoplasm of upper-outer quadrant of right breast in female, estrogen receptor positive ([place]) 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%, PR 2%, HER-2 not amplified, with an MIB-1 of 40% T2N2 Stage 3A Neoadj chemo with  AC-T completed 10/26/20 Bil mastectomies 04/25/21: Rt: 1.6 cm, grade 3, Deep margin pos, Perineural inv present, 0/24 Axln, 0/2 Intra mamm LN Neg  Adj XRT completed 07/24/21 ------------------------------------------------------------------------------------------------------------------------------------------------  Treatment Plan: Adj Anti estrogen therapy with letrozole started 07/10/2021 and abemaciclib started 11/09/2021 Letrozole toxicities: No side effects   Verzinio toxicities:  Diarrhea: Trying to stay hydrated. [name] it was bad. Staying at 50 bid Takes Imodium Fatigue Mild leukopenia: Monitoring  Left arm skin biopsy: Not healing quickly.  I encouraged her to discuss this with the dermatologist.   01/14/2022: CT chest: Stable radiation changes, prior bilateral mastectomies, no findings of metastatic disease.   Patient has appointment to see Jenny Reichmann in 2 months.  I will see her back in 6 months   No orders of the defined types were placed in this encounter.  The patient has a good understanding of the overall plan. she agrees with it. she will call with any problems that may develop before the next visit here. Total time spent: 30 mins including face to face time and time spent for planning, charting and co-ordination of care   Harriette Ohara, MD 08/08/22    I Gardiner Coins am acting as a Education administrator for [employer]  I have reviewed the above documentation for accuracy and completeness, and I agree with the above.

## 2022-08-08 ENCOUNTER — Inpatient Hospital Stay: Payer: 59 | Attending: Adult Health

## 2022-08-08 ENCOUNTER — Inpatient Hospital Stay (HOSPITAL_BASED_OUTPATIENT_CLINIC_OR_DEPARTMENT_OTHER): Payer: 59 | Admitting: Hematology and Oncology

## 2022-08-08 ENCOUNTER — Other Ambulatory Visit: Payer: Self-pay

## 2022-08-08 ENCOUNTER — Telehealth: Payer: Self-pay | Admitting: Hematology and Oncology

## 2022-08-08 VITALS — BP 113/63 | HR 85 | Temp 97.5°F | Resp 14 | Ht 68.0 in | Wt 137.7 lb

## 2022-08-08 DIAGNOSIS — Z170 Estrogen receptor positive status [ER+]: Secondary | ICD-10-CM | POA: Diagnosis not present

## 2022-08-08 DIAGNOSIS — Z9013 Acquired absence of bilateral breasts and nipples: Secondary | ICD-10-CM | POA: Insufficient documentation

## 2022-08-08 DIAGNOSIS — C50411 Malignant neoplasm of upper-outer quadrant of right female breast: Secondary | ICD-10-CM | POA: Diagnosis present

## 2022-08-08 DIAGNOSIS — Z79811 Long term (current) use of aromatase inhibitors: Secondary | ICD-10-CM | POA: Diagnosis not present

## 2022-08-08 DIAGNOSIS — Z923 Personal history of irradiation: Secondary | ICD-10-CM | POA: Diagnosis not present

## 2022-08-08 LAB — CMP (CANCER CENTER ONLY)
ALT: 9 U/L (ref 0–44)
AST: 13 U/L — ABNORMAL LOW (ref 15–41)
Albumin: 4 g/dL (ref 3.5–5.0)
Alkaline Phosphatase: 72 U/L (ref 38–126)
Anion gap: 6 (ref 5–15)
BUN: 14 mg/dL (ref 8–23)
CO2: 29 mmol/L (ref 22–32)
Calcium: 9.6 mg/dL (ref 8.9–10.3)
Chloride: 105 mmol/L (ref 98–111)
Creatinine: 0.96 mg/dL (ref 0.44–1.00)
GFR, Estimated: >60 mL/min (ref >60)
Glucose, Bld: 96 mg/dL (ref 70–99)
Potassium: 4.2 mmol/L (ref 3.5–5.1)
Sodium: 140 mmol/L (ref 135–145)
Total Bilirubin: 0.4 mg/dL (ref 0.3–1.2)
Total Protein: 7 g/dL (ref 6.5–8.1)

## 2022-08-08 LAB — CBC WITH DIFFERENTIAL (CANCER CENTER ONLY)
Abs Immature Granulocytes: 0.01 10*3/uL (ref 0.00–0.07)
Basophils Absolute: 0.1 10*3/uL (ref 0.0–0.1)
Basophils Relative: 2 %
Eosinophils Absolute: 0.1 10*3/uL (ref 0.0–0.5)
Eosinophils Relative: 2 %
HCT: 31.5 % — ABNORMAL LOW (ref 36.0–46.0)
Hemoglobin: 11 g/dL — ABNORMAL LOW (ref 12.0–15.0)
Immature Granulocytes: 0 %
Lymphocytes Relative: 26 %
Lymphs Abs: 0.8 10*3/uL (ref 0.7–4.0)
MCH: 36.2 pg — ABNORMAL HIGH (ref 26.0–34.0)
MCHC: 34.9 g/dL (ref 30.0–36.0)
MCV: 103.6 fL — ABNORMAL HIGH (ref 80.0–100.0)
Monocytes Absolute: 0.3 10*3/uL (ref 0.1–1.0)
Monocytes Relative: 9 %
Neutro Abs: 1.9 10*3/uL (ref 1.7–7.7)
Neutrophils Relative %: 61 %
Platelet Count: 210 10*3/uL (ref 150–400)
RBC: 3.04 MIL/uL — ABNORMAL LOW (ref 3.87–5.11)
RDW: 12.5 % (ref 11.5–15.5)
WBC Count: 3.2 10*3/uL — ABNORMAL LOW (ref 4.0–10.5)
nRBC: 0 % (ref 0.0–0.2)

## 2022-08-08 NOTE — Telephone Encounter (Signed)
Scheduled appointments per 2/15 los. Patient is aware of the made appointments.

## 2022-08-08 NOTE — Assessment & Plan Note (Signed)
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%, PR 2%, HER-2 not amplified, with an MIB-1 of 40% T2N2 Stage 3A Neoadj chemo with AC-T completed 10/26/20 Bil mastectomies 04/25/21: Rt: 1.6 cm, grade 3, Deep margin pos, Perineural inv present, 0/24 Axln, 0/2 Intra mamm LN Neg  Adj XRT completed 07/24/21 ------------------------------------------------------------------------------------------------------------------------------------------------  Treatment Plan: Adj Anti estrogen therapy with letrozole started 07/10/2021 and abemaciclib started 11/09/2021 Letrozole toxicities: No side effects   Verzinio toxicities:  Diarrhea: Trying to stay hydrated. [name] it was bad. Staying at 50 bid Takes Imodium Fatigue   01/14/2022: CT chest: Stable radiation changes, prior bilateral mastectomies, no findings of metastatic disease.

## 2022-08-14 ENCOUNTER — Other Ambulatory Visit: Payer: Self-pay | Admitting: Hematology and Oncology

## 2022-09-16 ENCOUNTER — Encounter: Payer: Self-pay | Admitting: Hematology and Oncology

## 2022-09-16 ENCOUNTER — Other Ambulatory Visit: Payer: Self-pay | Admitting: *Deleted

## 2022-09-16 DIAGNOSIS — Z170 Estrogen receptor positive status [ER+]: Secondary | ICD-10-CM

## 2022-09-16 NOTE — Progress Notes (Signed)
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  Pt denies redness or warmth to the extremity and states she has a hx of lymphedema.  Pt requesting referral be placed to [facility] for evaluation and tx of right arm lymphedema.  Orders placed. Pt states she will call to get scheduled.

## 2022-09-28 NOTE — Therapy (Signed)
OUTPATIENT PHYSICAL THERAPY  UPPER EXTREMITY ONCOLOGY EVALUATION  Patient Name: Melissa Mcgrath MRN: [id number] DOB:03/24/1961, 62 y.o., female Today's Date: 09/29/2022  END OF SESSION:  PT End of Session - 09/29/22 1153     Visit Number 1    Number of Visits 12    Date for PT Re-Evaluation 11/10/22    PT Start Time 1102    PT Stop Time 1150    PT Time Calculation (min) 48 min    Activity Tolerance Patient tolerated treatment well    Behavior During Therapy [facility] for tasks assessed/performed             Past Medical History:  Diagnosis Date   Anemia    Depression 01/05/2014   Facial basal cell cancer    Family history of breast cancer    Family history of melanoma    Family history of ovarian cancer    Family history of pancreatic cancer    History of radiation therapy    right chest wall and subclavian 06/11/2021-07/26/2021  Dr Antony Blackbird   Menopause 01/05/2014   Port-A-Cath in place 10/04/2020   Vaginal atrophy 11/09/2014   Past Surgical History:  Procedure Laterality Date   BILATERAL TOTAL MASTECTOMY WITH AXILLARY LYMPH NODE DISSECTION Right 04/25/2021   Procedure: RIGHT TOTAL MASTECTOMY WITH RIGHT AXILLARY LYMPH NODE DISSECTION;  Surgeon: Almond Lint, MD;  Location: [facility] OR;  Service: General;  Laterality: Right;   BREAST ENHANCEMENT SURGERY     BREAST IMPLANT REMOVAL Bilateral 04/25/2021   Procedure: REMOVAL BREAST IMPLANTS;  Surgeon: Almond Lint, MD;  Location: [facility] OR;  Service: General;  Laterality: Bilateral;   CARPAL TUNNEL RELEASE Right    PORT-A-CATH REMOVAL N/A 10/15/2021   Procedure: REMOVAL PORT-A-CATH;  Surgeon: Almond Lint, MD;  Location: [facility] OR;  Service: General;  Laterality: N/A;   PORTACATH PLACEMENT N/A 09/17/2020   Procedure: INSERTION PORT-A-CATH;  Surgeon: Almond Lint, MD;  Location: [facility];  Service: General;  Laterality: N/A;   refractive lensectomy Bilateral    SCAR REVISION Left 10/15/2021   Procedure: REVISION OF LEFT  MASTECTOMY SCAR;  Surgeon: Almond Lint, MD;  Location: [facility] OR;  Service: General;  Laterality: Left;   TOTAL MASTECTOMY Left 04/25/2021   Procedure: LEFT TOTAL MASTECTOMY;  Surgeon: Almond Lint, MD;  Location: [facility] OR;  Service: General;  Laterality: Left;   Patient Active Problem List   Diagnosis Date Noted   Genetic testing 09/18/2020   History of augmentation mammoplasty 08/31/2020   Family history of breast cancer    Family history of pancreatic cancer    Family history of melanoma    Family history of ovarian cancer    Malignant neoplasm of upper-outer quadrant of right breast in female, estrogen receptor positive 08/28/2020   Vaginal atrophy 11/09/2014   Menopause 01/05/2014     REFERRING PROVIDER: Lillard Anes NP  REFERRING DIAG: Right UE Lymphedema  THERAPY DIAG:  Malignant neoplasm of upper-outer quadrant of right breast in female, estrogen receptor positive  Aftercare following surgery for neoplasm  ONSET DATE: 09/15/2022  Rationale for Evaluation and Treatment: Rehabilitation  SUBJECTIVE:  SUBJECTIVE STATEMENT:  Pt reports swelling started 09/15/2022 after she was more active 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 She is also experiencing pain and a pulling sensation from the wrist to the elbow. She has been wearing her compression garment.  PERTINENT HISTORY:  Patient was diagnosed on 08/21/2020 with right grade III invasive ductal carcinoma breast cancer. It measures 2.1 cm and is located in the upper outer quadrant. It is ER/PR positive and HER2 negative with a Ki67 of 40%. She underwent a bilateral mastectomy with a right axillary node dissection (24 negative axillary nodes and 2 negative intramammary nodes) on 04/25/2021   PAIN:  Are you having pain? Yes NPRS scale: 4/10 Pain location:  right elbow to wrist Pain orientation: Right  PAIN TYPE: sharp and tight Pain description: intermittent and sharp  Aggravating factors: Reaching with the right UE, carrying groceries, barn activities Relieving factors: not reaching,, not carrying  PRECAUTIONS: Other: Right UE lymphedema risk  WEIGHT BEARING RESTRICTIONS: No  FALLS:  Has patient fallen in last 6 months? 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, helps care for animals, work at shows  LEISURE: walking, riding horses  HAND DOMINANCE: right   PRIOR LEVEL OF FUNCTION: Independent  PATIENT GOALS: Decrease swelling and pain with reaching   OBJECTIVE:  COGNITION: Overall cognitive status: Within functional limits for tasks assessed   PALPATION: Cording palpable elbow to wrist with tenderness  OBSERVATIONS / OTHER ASSESSMENTS: right arm visibly larger but pt is right handed  SENSATION: Light touch: Deficits     POSTURE: forward head, rounded shoulders  UPPER EXTREMITY AROM/PROM:  A/PROM RIGHT   eval   Shoulder extension 54  Shoulder flexion 130  Shoulder abduction 140  Shoulder internal rotation 67  Shoulder external rotation 95    (Blank rows = not tested)  A/PROM LEFT   eval  Shoulder extension 59  Shoulder flexion 160  Shoulder abduction 76  Shoulder internal rotation 70  Shoulder external rotation 100    (Blank rows = not tested)  CERVICAL AROM: All within functional limits:     UPPER EXTREMITY STRENGTH: WFL  LYMPHEDEMA ASSESSMENTS:   SURGERY TYPE/DATE: Bilateral Mastectomies with right LN biopsy  NUMBER OF LYMPH NODES REMOVED: 0/24  CHEMOTHERAPY: Yes  RADIATION:Yes, ended, 08/15/2021  HORMONE TREATMENT: Yes  INFECTIONS: no   LYMPHEDEMA  ASSESSMENTS:   LANDMARK RIGHT  eval  At axilla  30.2  15 cm proximal to olecranon process 29.4  10 cm proximal to olecranon process 29.15  Olecranon process 25.4  15 cm proximal to ulnar styloid process 24.6  10 cm proximal to ulnar styloid process 22.7  Just proximal to ulnar styloid process 16.55  Across hand at thumb web space 19.5  At base of 2nd digit 6.1  (Blank rows = not tested)  LANDMARK LEFT  eval  At axilla  30.5  15 cm proximal to olecranon process 29.6  10 cm proximal to olecranon process 27.9  Olecranon process 24.9  15 cm proximal to ulnar styloid process 23.1  10 cm proximal to ulnar styloid process 20.9  Just proximal to ulnar styloid process 15.7  Across hand at thumb web space 19.3  At base of 2nd digit 6.2  (Blank rows = not tested)     L-DEX LYMPHEDEMA SCREENING:  The patient was assessed using the L-Dex machine today to produce a lymphedema index baseline score. The patient will be reassessed on a regular basis (typically every 3 months) to obtain new L-Dex scores. If the score is > 6.5 points away from his/her baseline score indicating onset of subclinical lymphedema, it will be recommended to wear a compression garment for 4 weeks, 12 hours per day and then be reassessed. If the score continues to be > 6.5 points from baseline at reassessment, we will initiate lymphedema treatment. Assessing in this manner has a 95% rate of preventing clinically significant lymphedema. Score at 9 above baseline placing her in yellow range when done today (09/29/2022)  QUICK DASH SURVEY: 41%   TODAY'S TREATMENT:                                                                                                                                          DATE: 09/29/2022 Discussed POC including MFR to cording, instruction in MLD for decreasing cording/swelling, wearing sleeve daily at least 10 hours. Educated to start with elbow extension and then when ext is loose, add wrist  extension to gently stretch cords.   PATIENT EDUCATION:  Education details: elbow and wrist stretching for Cords, wear sleeve daily Person educated: Patient Education method: Medical illustratorxplanation and Demonstration Education comprehension: verbalized understanding and returned demonstration  HOME EXERCISE PROGRAM: Elbow and wrist NTS  ASSESSMENT:  CLINICAL IMPRESSION: Patient is a 62 y.o. female who was seen today for physical therapy evaluation and treatment for complaints of right UE swelling and tightness / pain from the wrist to the elbow that has been present since 09/14/2024 when she hung hay bags for 12 horses to help out her daughter. She presents today with multiple cords in the forearm that are tender, and mild increase in circumference of the right UE. SOZO screen was 9 points above baseline, but still in the yellow range. Right shoulder ROM is mildly limited. Advised pt to wear sleeve for 10 hours per day, and educated in gentle NTS to perform a few reps several times a day to stretch cords. Pt will benefit from skilled therapy to address deficits and return to PLOF.   OBJECTIVE IMPAIRMENTS: decreased activity tolerance, decreased knowledge of condition, decreased ROM, increased edema, impaired UE functional use, postural dysfunction, and pain.   ACTIVITY LIMITATIONS: carrying and reach over head  PARTICIPATION LIMITATIONS:  working in horse barn, carrying objects  PERSONAL FACTORS: 3+ comorbidities: Right mastectomy with ALND, 0/24 LN's, radiation and chemo  are also affecting patient's functional outcome.   REHAB POTENTIAL: Excellent  CLINICAL DECISION MAKING: Stable/uncomplicated  EVALUATION COMPLEXITY: Low  GOALS: Goals reviewed with patient? Yes  SHORT TERM GOALS: Target date: October 20, 2022  Pt will be independent with a HEP to gently right UE cording Baseline: Goal status: INITIAL  2.  Pt will have decrease pain from cording by 25%  Baseline:  Goal status:  INITIAL      LONG TERM GOALS: Target date: 11/10/2022  Pt will be able to reach overhead without increased forearm pain Baseline:  Goal status: INITIAL  2.  Pts quick dash will be no greater than 20% Baseline: 41% Goal status: INITIAL  3.  Pt will be independent in self MLD to the right UE Baseline: no Knowledge Goal status: INITIAL  4.  Pt will purchase appropriate compression garments to maintain swelling Baseline:  Goal status: INITIAL  5.  Right shoulder AROM will be WNL Baseline:  Goal status: INITIAL   PLAN:  PT FREQUENCY: 2x/week  PT DURATION: 6 weeks  PLANNED INTERVENTIONS: Therapeutic exercises, Therapeutic activity, Patient/Family education, Self Care, Joint mobilization, Orthotic/Fit training, Manual lymph drainage, scar mobilization, Manual therapy, and Re-evaluation  PLAN FOR NEXT SESSION: Review home NTS, MFR to right elbow and forearm cords, perform MLD to right UE and instruct pt., Have pt purchase new garments, Re-do SOZO prn, bandaging only if needed  Waynette Buttery, PT 09/29/2022, 11:55 AM

## 2022-09-29 ENCOUNTER — Ambulatory Visit: Payer: 59 | Attending: Adult Health

## 2022-09-29 ENCOUNTER — Other Ambulatory Visit: Payer: Self-pay

## 2022-09-29 DIAGNOSIS — Z170 Estrogen receptor positive status [ER+]: Secondary | ICD-10-CM | POA: Diagnosis present

## 2022-09-29 DIAGNOSIS — C50411 Malignant neoplasm of upper-outer quadrant of right female breast: Secondary | ICD-10-CM

## 2022-09-29 DIAGNOSIS — M25611 Stiffness of right shoulder, not elsewhere classified: Secondary | ICD-10-CM | POA: Diagnosis present

## 2022-09-29 DIAGNOSIS — Z483 Aftercare following surgery for neoplasm: Secondary | ICD-10-CM

## 2022-10-01 ENCOUNTER — Other Ambulatory Visit: Payer: Self-pay | Admitting: *Deleted

## 2022-10-01 DIAGNOSIS — C50411 Malignant neoplasm of upper-outer quadrant of right female breast: Secondary | ICD-10-CM

## 2022-10-02 ENCOUNTER — Inpatient Hospital Stay: Payer: 59 | Admitting: Pharmacist

## 2022-10-02 ENCOUNTER — Other Ambulatory Visit: Payer: Self-pay

## 2022-10-02 ENCOUNTER — Inpatient Hospital Stay: Payer: 59 | Attending: Adult Health

## 2022-10-02 VITALS — BP 121/54 | HR 78 | Temp 97.5°F | Resp 18 | Ht 68.0 in | Wt 141.6 lb

## 2022-10-02 DIAGNOSIS — C50411 Malignant neoplasm of upper-outer quadrant of right female breast: Secondary | ICD-10-CM

## 2022-10-02 DIAGNOSIS — Z170 Estrogen receptor positive status [ER+]: Secondary | ICD-10-CM | POA: Insufficient documentation

## 2022-10-02 LAB — CBC WITH DIFFERENTIAL (CANCER CENTER ONLY)
Abs Immature Granulocytes: 0.01 10*3/uL (ref 0.00–0.07)
Basophils Absolute: 0.1 10*3/uL (ref 0.0–0.1)
Basophils Relative: 3 %
Eosinophils Absolute: 0.1 10*3/uL (ref 0.0–0.5)
Eosinophils Relative: 3 %
HCT: 32.7 % — ABNORMAL LOW (ref 36.0–46.0)
Hemoglobin: 11.4 g/dL — ABNORMAL LOW (ref 12.0–15.0)
Immature Granulocytes: 0 %
Lymphocytes Relative: 27 %
Lymphs Abs: 0.8 10*3/uL (ref 0.7–4.0)
MCH: 36.2 pg — ABNORMAL HIGH (ref 26.0–34.0)
MCHC: 34.9 g/dL (ref 30.0–36.0)
MCV: 103.8 fL — ABNORMAL HIGH (ref 80.0–100.0)
Monocytes Absolute: 0.3 10*3/uL (ref 0.1–1.0)
Monocytes Relative: 11 %
Neutro Abs: 1.7 10*3/uL (ref 1.7–7.7)
Neutrophils Relative %: 56 %
Platelet Count: 211 10*3/uL (ref 150–400)
RBC: 3.15 MIL/uL — ABNORMAL LOW (ref 3.87–5.11)
RDW: 12.4 % (ref 11.5–15.5)
WBC Count: 3.1 10*3/uL — ABNORMAL LOW (ref 4.0–10.5)
nRBC: 0 % (ref 0.0–0.2)

## 2022-10-02 LAB — CMP (CANCER CENTER ONLY)
ALT: 10 U/L (ref 0–44)
AST: 15 U/L (ref 15–41)
Albumin: 4.1 g/dL (ref 3.5–5.0)
Alkaline Phosphatase: 73 U/L (ref 38–126)
Anion gap: 4 — ABNORMAL LOW (ref 5–15)
BUN: 16 mg/dL (ref 8–23)
CO2: 31 mmol/L (ref 22–32)
Calcium: 10.1 mg/dL (ref 8.9–10.3)
Chloride: 107 mmol/L (ref 98–111)
Creatinine: 1.02 mg/dL — ABNORMAL HIGH (ref 0.44–1.00)
GFR, Estimated: >60 mL/min (ref >60)
Glucose, Bld: 54 mg/dL — ABNORMAL LOW (ref 70–99)
Potassium: 4 mmol/L (ref 3.5–5.1)
Sodium: 142 mmol/L (ref 135–145)
Total Bilirubin: 0.5 mg/dL (ref 0.3–1.2)
Total Protein: 7.3 g/dL (ref 6.5–8.1)

## 2022-10-02 NOTE — Progress Notes (Signed)
[facility]       Telephone: [phone]?Fax: [phone]   Oncology Clinical Pharmacist Practitioner Progress Note  Melissa Mcgrath was contacted via in-person to discuss her chemotherapy regimen for abemaciclib which they receive under the care of Dr. Serena Croissant.   Current treatment regimen and start date Abemaciclib (11/09/21) Decreased to 50 mg BID on 04/23/23 due to nausea, diarrhea Increased to 100 mg BID on 03/16/22 Letrozole (07/10/21)   Interval History She continues  on abemaciclib 50 mg by mouth every 12 hours on days 1 to 28 of a 28-day cycle. This is being given in combination with letrozole. Therapy is planned to continue until  2 years in the adjuvant setting per the [facility] trial data .  Melissa Mcgrath was seen today by clinical pharmacy as a follow-up to her abemaciclib management.  She last had a telephone encounter with clinical pharmacy on 07/17/22 and last saw Dr. Pamelia Hoit on 08/08/22. She went back down to a dose of abemaciclib 50 mg every 12 hours on 04/22/22 after briefly being on 100 mg BID for about a month. 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 She also had a biopsy from her dermatologist on 07/31/22 that got infected.  She was on cephalexin for 14 days starting approximately on 09/05/22. The antibiotic was prescribed by her PCP. No signs of infection today.  Response to Therapy Overall, Melissa Mcgrath is doing well on abemaciclib 50 mg every 12 hours.  She continues to have fatigue mainly in the afternoons about 4 PM when her 62-year-old grandson is with her and her husband all day.  She has been taking naps when this occurs which she feels is helping.  Her serum creatinine today which is slightly elevated above the upper limit of normal and her blood glucose was reported at 54 mg/dL.  She is asymptomatic and we did provide written instructions on signs and symptoms to watch out for with  hypoglycemia.  She will follow-up with her PCP should any of the symptoms recur.  She has a follow-up appointment with Dr. Pamelia Hoit and labs on 02/05/23 and clinical pharmacy will see her about 4 months after that visit.  She knows that she can contact Dr. Earmon Phoenix clinic with any questions or concerns in the interim.  If she continues to tolerate abemaciclib well, she will tentatively finish up with adjuvant abemaciclib on 11/09/23. Labs, vitals, treatment parameters, and manufacturer guidelines assessing toxicity were reviewed with Melissa Mcgrath today. Based on these values, patient is in agreement to continue abemaciclib therapy at this time.  Allergies Allergies  Allergen Reactions   Peanut-Containing Drug Products Nausea And Vomiting    Vitals    10/02/2022   11:02 AM 08/08/2022   11:20 AM 06/17/2022    7:05 PM  Oncology Vitals  Height 173 cm 173 cm   Weight 64.229 kg 62.46 kg   Weight (lbs) 141 lbs 10 oz 137 lbs 11 oz   BMI 21.53 kg/m2   21.53 kg/m2 20.94 kg/m2   20.94 kg/m2   Temp 97.5 F (36.4 C) 97.5 F (36.4 C) 99 F (37.2 C)  Pulse Rate 78 85 110  BP 121/54 113/63 102/63  Resp 18 14 16   SpO2 100 % 100 % 99 %  BSA (m2) 1.76 m2   1.76 m2 1.73 m2   1.73 m2     Laboratory Data    Latest  Ref Rng & Units 10/02/2022   10:29 AM 08/08/2022   11:05 AM 05/19/2022   11:37 AM  CBC EXTENDED  WBC 4.0 - 10.5 K/uL 3.1  3.2  2.8   RBC 3.87 - 5.11 MIL/uL 3.15  3.04  3.19   Hemoglobin 12.0 - 15.0 g/dL 44.9  75.3  00.5   HCT 36.0 - 46.0 % 32.7  31.5  33.0   Platelets 150 - 400 K/uL 211  210  213   NEUT# 1.7 - 7.7 K/uL 1.7  1.9  1.6   Lymph# 0.7 - 4.0 K/uL 0.8  0.8  0.7        Latest Ref Rng & Units 10/02/2022   10:29 AM 08/08/2022   11:05 AM 05/19/2022   11:37 AM  CMP  Glucose 70 - 99 mg/dL 54  96  96   BUN 8 - 23 mg/dL 16  14  12    Creatinine 0.44 - 1.00 mg/dL 1.10  2.11  1.73   Sodium 135 - 145 mmol/L 142  140  139   Potassium 3.5 - 5.1 mmol/L 4.0  4.2  4.3   Chloride  98 - 111 mmol/L 107  105  106   CO2 22 - 32 mmol/L 31  29  29    Calcium 8.9 - 10.3 mg/dL 56.7  9.6  9.9   Total Protein 6.5 - 8.1 g/dL 7.3  7.0  7.2   Total Bilirubin 0.3 - 1.2 mg/dL 0.5  0.4  0.5   Alkaline Phos 38 - 126 U/L 73  72  78   AST 15 - 41 U/L 15  13  15    ALT 0 - 44 U/L 10  9  11      No results found for: "MG" No results found for: "CA2729"   Adverse Effects Assessment Serum creatinine: Slightly above the upper limit of normal today.  She continues to drink plenty of fluid and monitor urine output Low blood sugar: This appears to be the first value below the lower limit of normal.  She is asymptomatic at this time.  We gave her written instructions on symptoms to watch out for and she will follow-up with her PCP or Dr. Earmon Phoenix office should they occur  Adherence Assessment Melissa Mcgrath reports missing 0 doses over the past 8 weeks.   Reason for missed dose: N/A Patient was re-educated on importance of adherence.   Access Assessment Melissa Mcgrath is currently receiving her abemaciclib through [facility] Insurance concerns: None  Medication Reconciliation The patient's medication list was reviewed today with the patient?  Yes New medications or herbal supplements have recently been started?  No Any medications have been discontinued?  No The medication list was updated and reconciled based on the patient's most recent medication list in the electronic medical record (EMR) including herbal products and OTC medications.   Medications Current Outpatient Medications  Medication Sig Dispense Refill   abemaciclib (VERZENIO) 50 MG tablet Take 1 tablet (50 mg total) by mouth 2 (two) times daily. Swallow tablets whole. Do not chew, crush, or split tablets before swallowing. 56 tablet 5   acetaminophen (TYLENOL) 650 MG CR tablet Take 650 mg by mouth every 8 (eight) hours as needed for pain.     letrozole (FEMARA) 2.5 MG tablet Take 1 tablet by mouth once daily  90 tablet 3   valACYclovir (VALTREX) 500 MG tablet Take 500 mg by mouth 2 (two) times daily as needed (for fever bilsters outbreak).  No current facility-administered medications for this visit.   Drug-Drug Interactions (DDIs) DDIs were evaluated?  Yes Significant DDIs?  No The patient was instructed to speak with their health care provider and/or the oral chemotherapy pharmacist before starting any new drug, including prescription or over the counter, natural / herbal products, or vitamins.  Supportive Care Diarrhea: we reviewed that diarrhea is common with abemaciclib and confirmed that she does have loperamide (Imodium) at home.  We reviewed how to take this medication PRN. Neutropenia: we discussed the importance of having a thermometer and what the Centers for Disease Control and Prevention (CDC) considers a fever which is 100.23F (38C) or higher.  Gave patient 24/7 triage line to call if any fevers or symptoms. ILD/Pneumonitis: we reviewed potential symptoms including cough, shortness, and fatigue.  VTE: reviewed signs of DVT such as leg swelling, redness, pain, or tenderness and signs of PE such as shortness of breath, rapid or irregular heartbeat, cough, chest pain, or lightheadedness. Reviewed to take the medication every 12 hours (with food sometimes can be easier on the stomach) and to take it at the same time every day. Hepatotoxicity: WNL Low blood sugars: As above, it appears this is the first value that has been below the lower limit of normal.  She is asymptomatic.  We gave her written instructions on symptoms to watch out for and she will follow-up with her PCP or Dr. Pamelia HoitGudena should these symptoms occur  Dosing Assessment Hepatic adjustments needed?  No Renal adjustments needed?  No Toxicity adjustments needed?  No The current dosing regimen is appropriate to continue at this time.  Follow-Up Plan Continue abemaciclib 50 mg by mouth every 12 hours Continue letrozole 2.5  mg by mouth daily Monitor for symptoms of low blood sugar.  Written instructions on hypoglycemia given to Melissa Mcgrath today.  She will follow-up with her PCP or Dr. Pamelia HoitGudena should the symptoms occur.  Low possibility that this is due to [employer], Dr. Pamelia HoitGudena visit, scheduled for 02/05/23 We will add labs, pharmacy clinic visit, in mid December.  She will likely see Dr. Pamelia HoitGudena after this visit sometime in May when she will be finishing up abemaciclib.  Melissa MochaGail S Mcgrath participated in the discussion, expressed understanding, and voiced agreement with the above plan. All questions were answered to her satisfaction. The patient was advised to contact the clinic at [phone] with any questions or concerns prior to her return visit.   I spent 30 minutes assessing and educating the patient.  Domino Holten A. Odetta PinkIngoglia, PharmD, BCOP, CPP  Anselm LisJohn A Sulayman Manning, RPH-CPP, 10/02/2022  11:47 AM   **Disclaimer: This note was dictated with voice recognition software. Similar sounding words can inadvertently be transcribed and this note may contain transcription errors which may not have been corrected upon publication of note.**

## 2022-10-06 ENCOUNTER — Ambulatory Visit: Payer: 59

## 2022-10-16 ENCOUNTER — Encounter: Payer: 59 | Admitting: Rehabilitation

## 2022-10-17 ENCOUNTER — Other Ambulatory Visit: Payer: Self-pay | Admitting: Nurse Practitioner

## 2022-10-17 ENCOUNTER — Ambulatory Visit: Payer: 59 | Admitting: Nurse Practitioner

## 2022-10-17 VITALS — BP 108/76 | HR 67 | Temp 98.2°F | Ht 68.0 in | Wt 145.0 lb

## 2022-10-17 DIAGNOSIS — Z13220 Encounter for screening for lipoid disorders: Secondary | ICD-10-CM | POA: Diagnosis not present

## 2022-10-17 DIAGNOSIS — B001 Herpesviral vesicular dermatitis: Secondary | ICD-10-CM | POA: Diagnosis not present

## 2022-10-17 DIAGNOSIS — Z136 Encounter for screening for cardiovascular disorders: Secondary | ICD-10-CM | POA: Diagnosis not present

## 2022-10-17 DIAGNOSIS — R059 Cough, unspecified: Secondary | ICD-10-CM

## 2022-10-17 DIAGNOSIS — Z131 Encounter for screening for diabetes mellitus: Secondary | ICD-10-CM | POA: Diagnosis not present

## 2022-10-17 LAB — LIPID PANEL
Cholesterol: 201 mg/dL — ABNORMAL HIGH (ref 0–200)
HDL: 58.6 mg/dL (ref >39.00)
LDL Cholesterol: 115 mg/dL — ABNORMAL HIGH (ref 0–99)
NonHDL: 142.79
Total CHOL/HDL Ratio: 3
Triglycerides: 138 mg/dL (ref 0.0–149.0)
VLDL: 27.6 mg/dL (ref 0.0–40.0)

## 2022-10-17 LAB — COMPREHENSIVE METABOLIC PANEL
ALT: 11 U/L (ref 0–35)
AST: 17 U/L (ref 0–37)
Albumin: 4.1 g/dL (ref 3.5–5.2)
Alkaline Phosphatase: 67 U/L (ref 39–117)
BUN: 17 mg/dL (ref 6–23)
CO2: 29 mEq/L (ref 19–32)
Calcium: 9.7 mg/dL (ref 8.4–10.5)
Chloride: 103 mEq/L (ref 96–112)
Creatinine, Ser: 1.24 mg/dL — ABNORMAL HIGH (ref 0.40–1.20)
GFR: 46.83 mL/min — ABNORMAL LOW (ref >60.00)
Glucose, Bld: 79 mg/dL (ref 70–99)
Potassium: 4.1 mEq/L (ref 3.5–5.1)
Sodium: 139 mEq/L (ref 135–145)
Total Bilirubin: 0.3 mg/dL (ref 0.2–1.2)
Total Protein: 7.2 g/dL (ref 6.0–8.3)

## 2022-10-17 LAB — HEMOGLOBIN A1C: Hgb A1c MFr Bld: 5.2 % (ref 4.6–6.5)

## 2022-10-17 LAB — TSH: TSH: 1.74 u[IU]/mL (ref 0.35–5.50)

## 2022-10-17 MED ORDER — ACYCLOVIR 400 MG PO TABS: 400.0000 mg | ORAL_TABLET | Freq: Three times a day (TID) | ORAL | 3 refills | Status: DC | PRN

## 2022-10-17 MED ORDER — ALBUTEROL SULFATE HFA 108 (90 BASE) MCG/ACT IN AERS: 1.0000 | INHALATION_SPRAY | Freq: Four times a day (QID) | RESPIRATORY_TRACT | 0 refills | Status: AC | PRN

## 2022-10-17 NOTE — Progress Notes (Signed)
Estimated Creatinine Clearance: 48.1 mL/min (A) (by C-G formula based on SCr of 1.24 mg/dL (H)).

## 2022-10-17 NOTE — Assessment & Plan Note (Signed)
Labs ordered for further evaluation, further recommendations may be made based upon his results. 

## 2022-10-17 NOTE — Progress Notes (Signed)
New Patient Office Visit  Subjective    Patient ID: Melissa Mcgrath, female    DOB: 1960/09/14  Age: 62 y.o. MRN: [id number]  CC:  Chief Complaint  Patient presents with   New Patient (Initial Visit)    Establish care, questions about valtrex ( not working as good) Cough, due to radiation    Estimated Creatinine Clearance: 58.4 mL/min (A) (by C-G formula based on SCr of 1.02 mg/dL (H)).   HPI Melissa Mcgrath presents to establish care Her main concern today is cough and frequent cold sores. Cough: Intermittent, she reports these coughing spells that are triggered by smoke or pollen.  CT scan in July shows evidence of possible scarring secondary to radiation treatment for her breast cancer.  Denies being woken up from sleep due to cough. Ulcers: Normally takes Valtrex 500 mg twice a day as needed for intermittent cold sores.  Reports she feels has not been quite as effective lately would like to try different agent if possible.  Is on Verzenio currently.  Outpatient Encounter Medications as of 10/17/2022  Medication Sig   abemaciclib (VERZENIO) 50 MG tablet Take 1 tablet (50 mg total) by mouth 2 (two) times daily. Swallow tablets whole. Do not chew, crush, or split tablets before swallowing.   albuterol (VENTOLIN HFA) 108 (90 Base) MCG/ACT inhaler Inhale 1-2 puffs into the lungs every 6 (six) hours as needed for wheezing or shortness of breath.   letrozole (FEMARA) 2.5 MG tablet Take 1 tablet by mouth once daily   valACYclovir (VALTREX) 500 MG tablet Take 500 mg by mouth 2 (two) times daily as needed (for fever bilsters outbreak).   [DISCONTINUED] acetaminophen (TYLENOL) 650 MG CR tablet Take 650 mg by mouth every 8 (eight) hours as needed for pain.   No facility-administered encounter medications on file as of 10/17/2022.    Past Medical History:  Diagnosis Date   Anemia    Depression 01/05/2014   Facial basal cell cancer    Family history of breast cancer    Family history  of melanoma    Family history of ovarian cancer    Family history of pancreatic cancer    History of radiation therapy    right chest wall and subclavian 06/11/2021-07/26/2021  Dr Antony Blackbird   Menopause 01/05/2014   Port-A-Cath in place 10/04/2020   Vaginal atrophy 11/09/2014    Past Surgical History:  Procedure Laterality Date   BILATERAL TOTAL MASTECTOMY WITH AXILLARY LYMPH NODE DISSECTION Right 04/25/2021   Procedure: RIGHT TOTAL MASTECTOMY WITH RIGHT AXILLARY LYMPH NODE DISSECTION;  Surgeon: Almond Lint, MD;  Location: [facility] OR;  Service: General;  Laterality: Right;   BREAST ENHANCEMENT SURGERY     BREAST IMPLANT REMOVAL Bilateral 04/25/2021   Procedure: REMOVAL BREAST IMPLANTS;  Surgeon: Almond Lint, MD;  Location: [facility] OR;  Service: General;  Laterality: Bilateral;   CARPAL TUNNEL RELEASE Right    PORT-A-CATH REMOVAL N/A 10/15/2021   Procedure: REMOVAL PORT-A-CATH;  Surgeon: Almond Lint, MD;  Location: [facility] OR;  Service: General;  Laterality: N/A;   PORTACATH PLACEMENT N/A 09/17/2020   Procedure: INSERTION PORT-A-CATH;  Surgeon: Almond Lint, MD;  Location: [facility];  Service: General;  Laterality: N/A;   refractive lensectomy Bilateral    SCAR REVISION Left 10/15/2021   Procedure: REVISION OF LEFT MASTECTOMY SCAR;  Surgeon: Almond Lint, MD;  Location: [facility] OR;  Service: General;  Laterality: Left;   TOTAL MASTECTOMY Left 04/25/2021   Procedure: LEFT TOTAL MASTECTOMY;  Surgeon: Almond Lint, MD;  Location: [facility];  Service: General;  Laterality: Left;    Family History  Problem Relation Age of Onset   Breast cancer Mother 21       breast   Hypertension Father    Heart attack Father    Alzheimer's disease Father    Fibromyalgia Sister    Diabetes Sister    Heart attack Sister    Hypertension Sister    Hypertension Brother    Diabetes Brother    Pancreatic cancer Maternal Grandmother 8       Pancreatic   Melanoma Paternal Uncle        dx 34s   Melanoma  Paternal Uncle        dx 62s   Ovarian cancer Paternal Aunt        dx 50s/60s    Social History   Socioeconomic History   Marital status: Married    Spouse name: Not on file   Number of children: Not on file   Years of education: Not on file   Highest education level: Not on file  Occupational History   Not on file  Tobacco Use   Smoking status: Never   Smokeless tobacco: Never  Vaping Use   Vaping Use: Never used  Substance and Sexual Activity   Alcohol use: No   Drug use: No   Sexual activity: Not Currently    Birth control/protection: Post-menopausal  Other Topics Concern   Not on file  Social History Narrative   Married 34 years,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   Social Determinants of Health   Financial Resource Strain: Low Risk  (08/29/2020)   Overall Financial Resource Strain (CARDIA)    Difficulty of Paying Living Expenses: Not very hard  Food Insecurity: No Food Insecurity (08/29/2020)   Hunger Vital Sign    Worried About Running Out of Food in the Last Year: Never true    Ran Out of Food in the Last Year: Never true  Transportation Needs: No Transportation Needs (08/29/2020)   PRAPARE - Administrator, Civil Service (Medical): No    Lack of Transportation (Non-Medical): No  Physical Activity: Not on file  Stress: Not on file  Social Connections: Not on file  Intimate Partner Violence: Not on file    ROS      Objective    BP 108/76   Pulse 67   Temp 98.2 F (36.8 C) (Temporal)   Ht 5\' 8"  (1.727 m)   Wt 145 lb (65.8 kg)   SpO2 100%   BMI 22.05 kg/m   Physical Exam Vitals reviewed.  Constitutional:      General: She is not in acute distress.    Appearance: Normal appearance.  HENT:     Head: Normocephalic and atraumatic.  Neck:     Vascular: No carotid bruit.  Cardiovascular:     Rate and Rhythm: Normal rate and regular rhythm.     Pulses: Normal pulses.     Heart sounds: Normal heart sounds.  Pulmonary:      Effort: Pulmonary effort is normal.     Breath sounds: Normal breath sounds.  Skin:    General: Skin is warm and dry.  Neurological:     General: No focal deficit present.     Mental Status: She is alert and oriented to person, place, and time.  Psychiatric:        Mood and Affect: Mood normal.  Behavior: Behavior normal.        Judgment: Judgment normal.         Assessment & Plan:   Problem List Items Addressed This Visit       Digestive   Recurrent cold sores    Chronic, intermittent Will get metabolic panel to identify kidney function, pending results we will plan on prescribing acyclovir to be taken as needed for acute treatment of cold sore        Other   Cough - Primary    Chronic, intermittent Triggered by environmental exposure of smoking or pollen Questionable underlying asthma? Will trial albuterol inhaler, 2 puffs into the lungs every 6 hours as needed for cough or wheezing If this results in improvement in symptoms may consider referral for pulmonary function testing      Relevant Medications   albuterol (VENTOLIN HFA) 108 (90 Base) MCG/ACT inhaler   Other Relevant Orders   Hemoglobin A1c   Lipid panel   TSH   Comprehensive metabolic panel   Encounter for lipid screening for cardiovascular disease    Labs ordered for further evaluation, further recommendations may be made based upon his results      Relevant Orders   Hemoglobin A1c   Lipid panel   TSH   Comprehensive metabolic panel   Diabetes mellitus screening    Labs ordered for further evaluation, further recommendations may be made based upon his results      Relevant Orders   Hemoglobin A1c   Lipid panel   TSH   Comprehensive metabolic panel    Return in about 1 month (around 11/16/2022) for F/U with Lavaya Defreitas.   Elenore Paddy, NP

## 2022-10-17 NOTE — Assessment & Plan Note (Signed)
Chronic, intermittent Triggered by environmental exposure of smoking or pollen Questionable underlying asthma? Will trial albuterol inhaler, 2 puffs into the lungs every 6 hours as needed for cough or wheezing If this results in improvement in symptoms may consider referral for pulmonary function testing

## 2022-10-17 NOTE — Assessment & Plan Note (Signed)
Chronic, intermittent Will get metabolic panel to identify kidney function, pending results we will plan on prescribing acyclovir to be taken as needed for acute treatment of cold sore

## 2022-10-20 ENCOUNTER — Telehealth: Payer: Self-pay | Admitting: Pharmacist

## 2022-10-20 NOTE — Telephone Encounter (Signed)
Scheduled appointments per los. Left voicemail. 

## 2022-11-20 ENCOUNTER — Ambulatory Visit: Payer: 59 | Admitting: Nurse Practitioner

## 2022-12-23 ENCOUNTER — Other Ambulatory Visit: Payer: Self-pay | Admitting: Hematology and Oncology

## 2022-12-23 DIAGNOSIS — Z170 Estrogen receptor positive status [ER+]: Secondary | ICD-10-CM

## 2023-02-03 NOTE — Progress Notes (Signed)
Patient Care Team: Elenore Paddy, NP as PCP - General (Nurse Practitioner) Almond Lint, MD as Consulting Physician (General Surgery) Antony Blackbird, MD as Consulting Physician (Radiation Oncology) Marcelle Overlie, MD as Consulting Physician (Obstetrics and Gynecology) Bettey Costa, MD as Consulting Physician (Dermatology) Serena Croissant, MD as Consulting Physician (Hematology and Oncology) Anselm Lis, RPH-CPP as Pharmacist (Hematology and Oncology)  DIAGNOSIS: No diagnosis found.  SUMMARY OF ONCOLOGIC HISTORY: Oncology History  Malignant neoplasm of upper-outer quadrant of right breast in female, estrogen receptor positive (HCC)  08/21/2020 Initial Diagnosis   right breast upper outer quadrant biopsy 08/21/2020 for a clinical mT2 N1, stage IIA/B invasive ductal carcinoma, grade 3, estrogen receptor positive, progesterone receptor weakly positive, HER-2 not amplified, with an MIB-1 of 40%   08/29/2020 Cancer Staging   Staging form: Breast, AJCC 8th Edition - Clinical stage from 08/29/2020: Stage IIIA (cT2, cN2, cM0, G3, ER+, PR+, HER2-) - Signed by Loa Socks, NP on 09/07/2020 Stage prefix: Initial diagnosis Stage used in treatment planning: Yes National guidelines used in treatment planning: Yes Type of national guideline used in treatment planning: NCCN   09/18/2020 - 03/01/2021 Chemotherapy   AC X 4 foll by Taxol X 12    09/18/2020 Genetic Testing   Negative genetic testing:  No pathogenic variants detected on the [facility] CancerNext-Expanded + RNAinsight panel. The report date is 09/18/2020.   The CancerNext-Expanded + RNAinsight gene panel offered by [employer] and includes sequencing and rearrangement analysis for the following 77 genes: AIP, ALK, APC, ATM, AXIN2, BAP1, BARD1, BLM, BMPR1A, BRCA1, BRCA2, BRIP1, CDC73, CDH1, CDK4, CDKN1B, CDKN2A, CHEK2, CTNNA1, DICER1, FANCC, FH, FLCN, GALNT12, KIF1B, LZTR1, MAX, MEN1, MET, MLH1, MSH2, MSH3, MSH6, MUTYH, NBN, NF1,  NF2, NTHL1, PALB2, PHOX2B, PMS2, POT1, PRKAR1A, PTCH1, PTEN, RAD51C, RAD51D, RB1, RECQL, RET, SDHA, SDHAF2, SDHB, SDHC, SDHD, SMAD4, SMARCA4, SMARCB1, SMARCE1, STK11, SUFU, TMEM127, TP53, TSC1, TSC2, VHL and XRCC2 (sequencing and deletion/duplication); EGFR, EGLN1, HOXB13, KIT, MITF, PDGFRA, POLD1 and POLE (sequencing only); EPCAM and GREM1 (deletion/duplication only). RNA data is routinely analyzed for use in variant interpretation for all genes.    Surgery   bilateral mastectomies with right axillary lymph nodes dissection on 04/25/2021 showing (A) on the left, no evidence of malignancy (B) on the right a residual ypT1c ypN0 invasive ductal carcinoma, grade 3, with a focally positive deep margin a total of 24 right axillary and 2 right intramammary lymph nodes were removed, all clear   06/12/2021 - 07/24/2021 Radiation Therapy   Adj XRT   06/2021 -  Anti-estrogen oral therapy   Letrozole beginning 06/2021; Verzenio added 11/09/2021     CHIEF COMPLIANT:   INTERVAL HISTORY: Melissa Mcgrath is a   ALLERGIES:  is allergic to peanut-containing drug products.  MEDICATIONS:  Current Outpatient Medications  Medication Sig Dispense Refill   acyclovir (ZOVIRAX) 400 MG tablet Take 1 tablet (400 mg total) by mouth 3 (three) times daily as needed (Cold sore). For 5-10 days, discontinue after 10 days or after symptom resolution. 30 tablet 3   albuterol (VENTOLIN HFA) 108 (90 Base) MCG/ACT inhaler Inhale 1-2 puffs into the lungs every 6 (six) hours as needed for wheezing or shortness of breath. 8 g 0   letrozole (FEMARA) 2.5 MG tablet Take 1 tablet by mouth once daily 90 tablet 3   VERZENIO 50 MG tablet TAKE 1 TABLET BY MOUTH 2 TIMES A DAY. SWALLOW TABLETS WHOLE. DO NOT CHEW, CRUSH, OR SPLIT TABLETS BEFORE SWALLOWING. 56 tablet 5  No current facility-administered medications for this visit.    PHYSICAL EXAMINATION: ECOG PERFORMANCE STATUS: {CHL ONC ECOG PS:[id number]}  There were no vitals filed  for this visit. There were no vitals filed for this visit.  BREAST:*** No palpable masses or nodules in either right or left breasts. No palpable axillary supraclavicular or infraclavicular adenopathy no breast tenderness or nipple discharge. (exam performed in the presence of a chaperone)  LABORATORY DATA:  I have reviewed the data as listed    Latest Ref Rng & Units 10/17/2022    3:24 PM 10/02/2022   10:29 AM 08/08/2022   11:05 AM  CMP  Glucose 70 - 99 mg/dL 79  54  96   BUN 6 - 23 mg/dL 17  16  14    Creatinine 0.40 - 1.20 mg/dL 1.61  0.96  0.45   Sodium 135 - 145 mEq/L 139  142  140   Potassium 3.5 - 5.1 mEq/L 4.1  4.0  4.2   Chloride 96 - 112 mEq/L 103  107  105   CO2 19 - 32 mEq/L 29  31  29    Calcium 8.4 - 10.5 mg/dL 9.7  40.9  9.6   Total Protein 6.0 - 8.3 g/dL 7.2  7.3  7.0   Total Bilirubin 0.2 - 1.2 mg/dL 0.3  0.5  0.4   Alkaline Phos 39 - 117 U/L 67  73  72   AST 0 - 37 U/L 17  15  13    ALT 0 - 35 U/L 11  10  9      Lab Results  Component Value Date   WBC 3.1 (L) 10/02/2022   HGB 11.4 (L) 10/02/2022   HCT 32.7 (L) 10/02/2022   MCV 103.8 (H) 10/02/2022   PLT 211 10/02/2022   NEUTROABS 1.7 10/02/2022    ASSESSMENT & PLAN:  No problem-specific Assessment & Plan notes found for this encounter.    No orders of the defined types were placed in this encounter.  The patient has a good understanding of the overall plan. she agrees with it. she will call with any problems that may develop before the next visit here. Total time spent: 30 mins including face to face time and time spent for planning, charting and co-ordination of care   Sherlyn Lick, CMA 02/03/23    I Janan Ridge am acting as a Neurosurgeon for [employer]  ***

## 2023-02-05 ENCOUNTER — Other Ambulatory Visit: Payer: Self-pay

## 2023-02-05 ENCOUNTER — Inpatient Hospital Stay (HOSPITAL_BASED_OUTPATIENT_CLINIC_OR_DEPARTMENT_OTHER): Payer: 59 | Admitting: Hematology and Oncology

## 2023-02-05 ENCOUNTER — Inpatient Hospital Stay: Payer: 59 | Attending: Hematology and Oncology

## 2023-02-05 VITALS — BP 116/55 | HR 70 | Temp 97.2°F | Resp 18 | Ht 68.0 in | Wt 136.6 lb

## 2023-02-05 DIAGNOSIS — Z170 Estrogen receptor positive status [ER+]: Secondary | ICD-10-CM | POA: Insufficient documentation

## 2023-02-05 DIAGNOSIS — Z79811 Long term (current) use of aromatase inhibitors: Secondary | ICD-10-CM | POA: Insufficient documentation

## 2023-02-05 DIAGNOSIS — M25511 Pain in right shoulder: Secondary | ICD-10-CM | POA: Insufficient documentation

## 2023-02-05 DIAGNOSIS — I890 Lymphedema, not elsewhere classified: Secondary | ICD-10-CM | POA: Diagnosis not present

## 2023-02-05 DIAGNOSIS — C50411 Malignant neoplasm of upper-outer quadrant of right female breast: Secondary | ICD-10-CM | POA: Diagnosis present

## 2023-02-05 LAB — CMP (CANCER CENTER ONLY)
ALT: 9 U/L (ref 0–44)
AST: 14 U/L — ABNORMAL LOW (ref 15–41)
Albumin: 4.1 g/dL (ref 3.5–5.0)
Alkaline Phosphatase: 69 U/L (ref 38–126)
Anion gap: 6 (ref 5–15)
BUN: 10 mg/dL (ref 8–23)
CO2: 29 mmol/L (ref 22–32)
Calcium: 9.3 mg/dL (ref 8.9–10.3)
Chloride: 105 mmol/L (ref 98–111)
Creatinine: 1.03 mg/dL — ABNORMAL HIGH (ref 0.44–1.00)
GFR, Estimated: >60 mL/min (ref >60)
Glucose, Bld: 87 mg/dL (ref 70–99)
Potassium: 4 mmol/L (ref 3.5–5.1)
Sodium: 140 mmol/L (ref 135–145)
Total Bilirubin: 0.5 mg/dL (ref 0.3–1.2)
Total Protein: 7 g/dL (ref 6.5–8.1)

## 2023-02-05 LAB — CBC WITH DIFFERENTIAL (CANCER CENTER ONLY)
Abs Immature Granulocytes: 0 10*3/uL (ref 0.00–0.07)
Basophils Absolute: 0.1 10*3/uL (ref 0.0–0.1)
Basophils Relative: 2 %
Eosinophils Absolute: 0.1 10*3/uL (ref 0.0–0.5)
Eosinophils Relative: 3 %
HCT: 32.4 % — ABNORMAL LOW (ref 36.0–46.0)
Hemoglobin: 11 g/dL — ABNORMAL LOW (ref 12.0–15.0)
Immature Granulocytes: 0 %
Lymphocytes Relative: 30 %
Lymphs Abs: 1 10*3/uL (ref 0.7–4.0)
MCH: 35.1 pg — ABNORMAL HIGH (ref 26.0–34.0)
MCHC: 34 g/dL (ref 30.0–36.0)
MCV: 103.5 fL — ABNORMAL HIGH (ref 80.0–100.0)
Monocytes Absolute: 0.3 10*3/uL (ref 0.1–1.0)
Monocytes Relative: 11 %
Neutro Abs: 1.7 10*3/uL (ref 1.7–7.7)
Neutrophils Relative %: 54 %
Platelet Count: 191 10*3/uL (ref 150–400)
RBC: 3.13 MIL/uL — ABNORMAL LOW (ref 3.87–5.11)
RDW: 12.4 % (ref 11.5–15.5)
WBC Count: 3.2 10*3/uL — ABNORMAL LOW (ref 4.0–10.5)
nRBC: 0.6 % — ABNORMAL HIGH (ref 0.0–0.2)

## 2023-02-05 NOTE — Assessment & Plan Note (Addendum)
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%, PR 2%, HER-2 not amplified, with an MIB-1 of 40% T2N2 Stage 3A Neoadj chemo with AC-T completed 10/26/20 Bil mastectomies 04/25/21: Rt: 1.6 cm, grade 3, Deep margin pos, Perineural inv present, 0/24 Axln, 0/2 Intra mamm LN Neg  Adj XRT completed 07/24/21 ------------------------------------------------------------------------------------------------------------------------------------------------  Treatment Plan: Adj Anti estrogen therapy with letrozole started 07/10/2021 and abemaciclib started 11/09/2021 Letrozole toxicities: No side effects   Verzinio toxicities:  Diarrhea: Trying to stay hydrated. Louann Liv it was bad. Staying at 50 bid Takes Imodium Fatigue Mild leukopenia: Monitoring   Left arm skin biopsy: Not healing quickly.  I encouraged her to discuss this with the dermatologist.   01/14/2022: CT chest: Stable radiation changes, prior bilateral mastectomies, no findings of metastatic disease.   Patient has appointment to see Jonny Ruiz in 2 months.  We discussed the duration of Verzinio and manage decision that she will stop Verzenio in March 2025.  I will see her at the end of Verzinio for follow-up.

## 2023-02-10 ENCOUNTER — Telehealth: Payer: Self-pay

## 2023-02-10 NOTE — Telephone Encounter (Signed)
Pt called and asked about new RX she and MD discussed at her 8/15 appt. She is asking what this medication is and requests RX is sent in. Advised pt I am not sure what she and MD discussed and I would speak with him and call her back 8/21. She verbalized thanks and understanding.

## 2023-02-11 ENCOUNTER — Other Ambulatory Visit: Payer: Self-pay

## 2023-02-11 DIAGNOSIS — M25511 Pain in right shoulder: Secondary | ICD-10-CM

## 2023-02-11 DIAGNOSIS — Z170 Estrogen receptor positive status [ER+]: Secondary | ICD-10-CM

## 2023-02-11 MED ORDER — ANASTROZOLE 1 MG PO TABS: 1.0000 mg | ORAL_TABLET | Freq: Every day | ORAL | 0 refills | Status: DC

## 2023-02-11 NOTE — Progress Notes (Signed)
S/w pt regarding question about AI therapy. Per MD, pt to stop Letrozole for 2 weeks, then start Anastrozole then f/u with phone visit in 2 mos to review how she is tolerating Anastrozole. She is in agreement with this and verbalized thanks. Message sent to scheduler.

## 2023-02-12 ENCOUNTER — Telehealth: Payer: Self-pay | Admitting: Hematology and Oncology

## 2023-02-12 NOTE — Telephone Encounter (Signed)
Scheduled appointment per staff message. Left voicemail with appointment details.

## 2023-02-24 ENCOUNTER — Ambulatory Visit
Admission: RE | Admit: 2023-02-24 | Discharge: 2023-02-24 | Disposition: A | Discharge status: Home / Self Care | Payer: 59 | Source: Ambulatory Visit | Attending: Sports Medicine | Admitting: Sports Medicine

## 2023-02-24 ENCOUNTER — Ambulatory Visit: Payer: 59 | Admitting: Sports Medicine

## 2023-02-24 VITALS — BP 102/64 | Ht 68.0 in | Wt 135.0 lb

## 2023-02-24 DIAGNOSIS — G8929 Other chronic pain: Secondary | ICD-10-CM

## 2023-02-24 DIAGNOSIS — M25511 Pain in right shoulder: Secondary | ICD-10-CM

## 2023-02-24 NOTE — Progress Notes (Signed)
   Subjective:    Patient ID: Melissa Mcgrath, female    DOB: [birth year], 62 y.o.   MRN: [id number]  HPI chief complaint: Right shoulder pain  Melissa Mcgrath is a very pleasant 62 year old female that presents today with right shoulder pain that has been present for several months.  She does not recall any specific trauma.  She is very active.  She is status post bilateral double mastectomy for breast cancer last year.  She also received radiation treatment with that.  Her pain is primarily in the anterior shoulder and worse with certain arm motions such as reaching across her body.  She is also experiencing pain at night.  She has tried both Tylenol and Aleve without much help.  She endorses some mild residual swelling in the right arm as a result of lymph node removal at the time of her surgery.  No prior shoulder surgeries.  Past medical history reviewed Medications reviewed Allergies reviewed    Review of Systems As above    Objective:   Physical Exam  Well-developed, well-nourished.  No acute distress  Right shoulder: No gross deformity.  No atrophy.  She has fairly well-preserved range of motion.  Negative painful arc.  She is tender to palpation directly over the biceps tendon.  Positive speeds and positive Yergason's.  No tenderness over the [facility] joint.  Positive empty can.  Rotator cuff strength is 5/5 but does reproduce pain with resisted supraspinatus and infraspinatus but that pain is localized to the biceps tendon.  She is neurovascularly intact distally.      Assessment & Plan:   Right shoulder pain likely secondary to proximal biceps tendinopathy Status post bilateral mastectomy with radiation treatment  We will get an x-ray of the right shoulder including AP and axillary views.  She will return to the office in a few days for complete right shoulder ultrasound.  We we will formulate a treatment plan based on those findings.  This note was dictated using Dragon naturally speaking  software and may contain errors in syntax, spelling, or content which have not been identified prior to signing this note.

## 2023-03-04 ENCOUNTER — Encounter: Payer: Self-pay | Admitting: Sports Medicine

## 2023-03-04 ENCOUNTER — Ambulatory Visit (INDEPENDENT_AMBULATORY_CARE_PROVIDER_SITE_OTHER): Payer: 59 | Admitting: Sports Medicine

## 2023-03-04 ENCOUNTER — Other Ambulatory Visit: Payer: Self-pay

## 2023-03-04 VITALS — BP 108/80 | Ht 68.0 in | Wt 135.0 lb

## 2023-03-04 DIAGNOSIS — G8929 Other chronic pain: Secondary | ICD-10-CM | POA: Diagnosis not present

## 2023-03-04 DIAGNOSIS — M25511 Pain in right shoulder: Secondary | ICD-10-CM | POA: Diagnosis not present

## 2023-03-04 MED ORDER — MELOXICAM 15 MG PO TABS: 15.0000 mg | ORAL_TABLET | Freq: Every day | ORAL | 1 refills | Status: DC

## 2023-03-04 NOTE — Progress Notes (Signed)
PCP: Elenore Paddy, NP  SUBJECTIVE:   HPI:  Patient is a 62 y.o. female here for diagnostic U/S of her right shoulder. She saw Dr. Margaretha Sheffield here last week and there was concern for possible biceps tendinopathy vs RTC injury. She notes pain both in the anterior shoulder and lateral shoulder. Pain worse with arm lifted above her shoulder and across her body. Little relief with tylenol and aleve. She had 3-view right shoulder X-rays performed in the interim that I have reviewed and show mild sclerotic changes of the glenoid though no other significant degenerative or osseous abnormalities.  ROS:     See HPI  PERTINENT  PMH / PSH FH / / SH:  Past Medical, Surgical, Social, and Family History Reviewed & Updated in the EMR.  Pertinent findings include:  Hx of breast cancer - s/p bilateral mastectomy and lymph node resection in right axilla Hx of radiation therapy to right chest wall (12/22 - 2/23) Current Medications: Acyclovir 400mg  TID PRN, Albuterol 146mcg/act PRN, Anastrozole 1mg  QHS, and Verzenio 50mg  BID  Allergies  Allergen Reactions   Peanut-Containing Drug Products Nausea And Vomiting   OBJECTIVE:  BP 108/80   Ht 5\' 8"  (1.727 m)   Wt 135 lb (61.2 kg)   BMI 20.53 kg/m   Right Shoulder MSK EXAM: No swelling, ecchymoses. No gross deformity. TTP over [facility] joint. FROM. Negative Hawkins, Neers. Strength 5/5 with empty can (+pain) and resisted internal/external rotation. Positive Speeds and Yergesons Positive Cross-arm and Aretha Parrot. NV intact distally.  Right Shoulder Complete MSK Diagnostic U/S: Patient was seated on exam table and shoulder US examination was performed using high frequency linear probe.  -Biceps tendon was visualized within the bicipital groove in both longitudinal and transverse axis with minimal fluid collection in tendon sheath. Tendon fibers intact without signs of irregularity.  -Subscapularis tendon was visualized in both LAX and SAX with intact tendon  inserting at the inferior lesser tubercle of the humerus. No signs of tear, no hypoechoic changes or tissue irregularity were seen.  -Supraspinatus tendon was visualized in LAX and SAX. Hyperechoic deposits in distal tendon at greater tubercle of the humerus, no acute tear noted.  -Infraspinatus and teres minor tendons were visualized in LAX and SAX with tendon insertion at the middle facet of the great tubercle of the humerus with no signs of tearing, hypoechoic changes or tissue irregularity seen.  -Posterior glenohumeral joint was visualized with proper alignment, note increased fluid collection in joint capsule.  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    ASSESSMENT & PLAN:  Chronic right shoulder pain Assessment & Plan: Exam and U/S findings consistent with chronic supraspinatous tendinopathy and AC joint arthritis. X-rays with minor glenoid bony sclerosis though no significant degenerative changes that explain her pain. I reviewed her ultrasound findings and recommend short course of Mobic and referral for formal RTC physical therapy. She is amenable with this plan. F/u in 6 weeks and consider GH and Sub-acromial injection if failing to improve.  Orders: -     [place] COMPLETE JOINT SPACE STRUCTURES UP RIGHT; Future -     Ambulatory referral to Physical Therapy  Other orders -     Meloxicam; Take 1 tablet (15 mg total) by mouth daily.  Dispense: 30 tablet; Refill: 1   Glean Salen, MD PGY-4, Sports Medicine Fellow [facility]  Addendum:  Patient seen in the office by fellow.  His history, exam, plan of care were precepted with me.  Norton Blizzard MD Marrianne Mood

## 2023-03-04 NOTE — Assessment & Plan Note (Signed)
Exam and U/S findings consistent with chronic supraspinatous tendinopathy and AC joint arthritis. X-rays with minor glenoid bony sclerosis though no significant degenerative changes that explain her pain. I reviewed her ultrasound findings and recommend short course of Mobic and referral for formal RTC physical therapy. She is amenable with this plan. F/u in 6 weeks and consider GH and Sub-acromial injection if failing to improve.

## 2023-03-06 NOTE — Therapy (Signed)
OUTPATIENT PHYSICAL THERAPY SHOULDER EVALUATION   Patient Name: Melissa Mcgrath MRN: [id number] DOB:1961-03-04, 62 y.o., female Today's Date: 03/09/2023  END OF SESSION:  PT End of Session - 03/09/23 1147     Visit Number 1    Date for PT Re-Evaluation 05/04/23    Authorization Type Jaye Beagle    PT Start Time 1148    PT Stop Time 1233    PT Time Calculation (min) 45 min    Activity Tolerance Patient tolerated treatment well    Behavior During Therapy [facility] for tasks assessed/performed             Past Medical History:  Diagnosis Date   Anemia    Depression 01/05/2014   Facial basal cell cancer    Family history of breast cancer    Family history of melanoma    Family history of ovarian cancer    Family history of pancreatic cancer    History of radiation therapy    right chest wall and subclavian 06/11/2021-07/26/2021  Dr Antony Blackbird   Menopause 01/05/2014   Port-A-Cath in place 10/04/2020   Vaginal atrophy 11/09/2014   Past Surgical History:  Procedure Laterality Date   BILATERAL TOTAL MASTECTOMY WITH AXILLARY LYMPH NODE DISSECTION Right 04/25/2021   Procedure: RIGHT TOTAL MASTECTOMY WITH RIGHT AXILLARY LYMPH NODE DISSECTION;  Surgeon: Almond Lint, MD;  Location: [facility] OR;  Service: General;  Laterality: Right;   BREAST ENHANCEMENT SURGERY     BREAST IMPLANT REMOVAL Bilateral 04/25/2021   Procedure: REMOVAL BREAST IMPLANTS;  Surgeon: Almond Lint, MD;  Location: [facility] OR;  Service: General;  Laterality: Bilateral;   CARPAL TUNNEL RELEASE Right    PORT-A-CATH REMOVAL N/A 10/15/2021   Procedure: REMOVAL PORT-A-CATH;  Surgeon: Almond Lint, MD;  Location: [facility] OR;  Service: General;  Laterality: N/A;   PORTACATH PLACEMENT N/A 09/17/2020   Procedure: INSERTION PORT-A-CATH;  Surgeon: Almond Lint, MD;  Location: [facility];  Service: General;  Laterality: N/A;   refractive lensectomy Bilateral    SCAR REVISION Left 10/15/2021   Procedure: REVISION OF LEFT  MASTECTOMY SCAR;  Surgeon: Almond Lint, MD;  Location: [facility] OR;  Service: General;  Laterality: Left;   TOTAL MASTECTOMY Left 04/25/2021   Procedure: LEFT TOTAL MASTECTOMY;  Surgeon: Almond Lint, MD;  Location: [facility] OR;  Service: General;  Laterality: Left;   Patient Active Problem List   Diagnosis Date Noted   Chronic right shoulder pain 03/04/2023   Cough 10/17/2022   Encounter for lipid screening for cardiovascular disease 10/17/2022   Diabetes mellitus screening 10/17/2022   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   Genetic testing 09/18/2020   History of augmentation mammoplasty 08/31/2020   Family history of breast cancer    Family history of pancreatic cancer    Family history of melanoma    Family history of ovarian cancer    Malignant neoplasm of upper-outer quadrant of right breast in female, estrogen receptor positive (HCC) 08/28/2020   Vaginal atrophy 11/09/2014   Menopause 01/05/2014    PCP: Elenore Paddy, NP   REFERRING PROVIDER: Marisa Cyphers, MD   REFERRING DIAG: [phone] (ICD-10-CM) - Chronic right shoulder pain  Evaluate and treat for right shoulder rotator cuff tendinopathy and AC joint arthritis.   THERAPY DIAG:  Chronic right shoulder pain  Stiffness of right shoulder, not elsewhere classified  Cramp and spasm  Rationale for Evaluation and Treatment: Rehabilitation  ONSET DATE: March 2024  SUBJECTIVE:  SUBJECTIVE STATEMENT: Patient had double mastectomy and had 24 lymph nodes removed in Dec 2022. 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 She has trouble riding her horse to controlling the reigns. Meloxicam has really helped and it has really helped with the pain. She tried putting her sleeve back on but this didn't make a  difference. Prior to this episode she had normal use of her shoulder. Sleeping has been helped with Meloxicam. Rides 2x per week. Cannot weed eat but could previously. Hand dominance: Right  PERTINENT HISTORY: B mastectomy  PAIN:  Are you having pain? Yes: NPRS scale: 1  up to 8 with riding/10 Pain location: ant right shoulder to laterl and post spots Pain description: sharp and then aches Aggravating factors: riding, vacuuming, sleeping, reaching OH to water flowers Relieving factors: ice, Meloxicam, rest  PRECAUTIONS: Other: R breast CA and subsequent surgeries  RED FLAGS: None   WEIGHT BEARING RESTRICTIONS: No  FALLS:  Has patient fallen in last 6 months? 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   PATIENT SURVEYS:  Quick Dash 52.3 / 100 = 52.3 %  COGNITION: Overall cognitive status: Within functional limits for tasks assessed     SENSATION: WFL  POSTURE: Mild R winging of scapula, mild fwd head  UPPER EXTREMITY ROM: Functional IR - thumb to T7 L, L1 R   A/P ROM Right eval Left eval  Shoulder flexion 145/165 170/180  Shoulder extension    Shoulder abduction 102/135 148/180  Shoulder adduction    Shoulder internal rotation 63/75 53/80  Shoulder external rotation 58/72   Elbow flexion    Elbow extension    (Blank rows = not tested)  UPPER EXTREMITY MMT:  MMT Right eval Left eval  Shoulder flexion 5 5  Shoulder extension 4- 4+  Shoulder abduction 4+ 5  Shoulder adduction    Shoulder internal rotation 4+ 5  Shoulder external rotation 5 5  Middle  trapezius    Lower trapezius    Elbow flexion 5   Elbow extension    (Blank rows = not tested)  SHOULDER SPECIAL TESTS: Impingement tests: Neer impingement test: negative and Hawkins/Kennedy impingement test: positive   Rotator cuff assessment: Drop arm test: negative, Empty can test: negative, Full can test: negative, and Gerber lift off test: negative    PALPATION:  Left levator, UT, lats, deltoids, pectorals   TODAY'S TREATMENT:  DATE:  03/09/23 See pt ed and HEP   PATIENT EDUCATION: Education details: PT eval findings, anticipated POC, initial HEP, and role of DN  Person educated: Patient Education method: Explanation, Demonstration, and Handouts Education comprehension: verbalized understanding and returned demonstration  HOME EXERCISE PROGRAM: Access Code: [facility] URL: [email or web address] Date: 03/09/2023 Prepared by: Raynelle Fanning  Exercises - Supine Shoulder Flexion Extension AAROM with Dowel  - 1 x daily - 7 x weekly - 1-2 sets - 10 reps - 5 seconds hold - Supine Shoulder External Rotation with Dowel  - 1 x daily - 7 x weekly - 1-2 sets - 10 reps - 5 sseconds hold - Shoulder Scaption AAROM with Dowel  - 1 x daily - 7 x weekly - 1-2 sets - 10 reps - 3-5 seconds hold - Standing Shoulder Extension with Dowel  - 1 x daily - 7 x weekly - 3 sets - 10 reps - Standing Shoulder External Rotation Stretch in Doorway (Mirrored)  - 2 x daily - 7 x weekly - 1 sets - 3 reps - 15-30 sec hold  ASSESSMENT:  CLINICAL IMPRESSION: Melissa Mcgrath is a 62 y.o. female who was seen today for physical therapy evaluation and treatment for R RC tendinopathy and A/C joint arthritis beginning in March of this year and progressively worsening. She presents with deficits in ROM, strength and flexibility affecting ADLs including OH reaching, riding her  horse, doing her farm chores and sleeping. She has a h/o of B mastectomy and would like to return to her level of function post breast cancer. She will benefit from skilled PT to address these deficits.    OBJECTIVE IMPAIRMENTS: decreased activity tolerance, decreased ROM, decreased strength, increased muscle spasms, impaired flexibility, impaired UE functional use, postural dysfunction, and pain.   ACTIVITY LIMITATIONS: carrying, lifting, sleeping, bathing, and reach over head  PARTICIPATION LIMITATIONS: yard work  PERSONAL FACTORS: Time since onset of injury/illness/exacerbation and 1 comorbidity: previous breast surgery  are also affecting patient's functional outcome.   REHAB POTENTIAL: Excellent  CLINICAL DECISION MAKING: Stable/uncomplicated  EVALUATION COMPLEXITY: Low   GOALS: Goals reviewed with patient? Yes  SHORT TERM GOALS: Target date: 04/06/2023   Patient will be independent with initial HEP.  Baseline:  Goal status: INITIAL   LONG TERM GOALS: Target date: 05/04/2023   Patient will be independent with advanced/ongoing HEP to improve outcomes and carryover.  Baseline:  Goal status: INITIAL  2.  Patient will report 75% improvement in R shoulder pain to improve QOL.  Baseline:  Goal status: INITIAL  3.  Patient will report 55 on Quick Dash to demonstrate improved functional ability.  Baseline: 52.3 / 100 = 52.3 % Goal status: INITIAL  4.  Patient to improve R shoulder AROM to  [facility]  without pain provocation to allow patient to perform her normal chores/ADLs.  Baseline:  Goal status: INITIAL  5.  Patient will demonstrate improved functional UE strength as demonstrated by 5/5 MMT. Baseline:  Goal status: INITIAL  6 .  Patient will be able to ride her horse with 75% less pain in her R shoulder.   Baseline:  Goal status: INITIAL   7. Patient able to sleep without waking from pain Baseline:  Goal status: INITIAL   PLAN:  PT FREQUENCY: 2x/week  PT  DURATION: 8 weeks  PLANNED INTERVENTIONS: Therapeutic exercises, Therapeutic activity, Neuromuscular re-education, Patient/Family education, Self Care, Joint mobilization, Dry Needling, Electrical stimulation, Spinal mobilization, Cryotherapy, Moist heat, Taping, Ionotophoresis 4mg /ml Dexamethasone, and Manual therapy  PLAN  FOR NEXT SESSION: Review and progress HEP, MT/DN to R UQ, R shoulder strength as tolerated, ROM   Solon Palm, PT  03/09/2023, 5:47 PM

## 2023-03-09 ENCOUNTER — Ambulatory Visit: Payer: 59 | Attending: Sports Medicine | Admitting: Physical Therapy

## 2023-03-09 ENCOUNTER — Encounter: Payer: Self-pay | Admitting: Physical Therapy

## 2023-03-09 ENCOUNTER — Other Ambulatory Visit: Payer: Self-pay

## 2023-03-09 DIAGNOSIS — G8929 Other chronic pain: Secondary | ICD-10-CM | POA: Insufficient documentation

## 2023-03-09 DIAGNOSIS — M25511 Pain in right shoulder: Secondary | ICD-10-CM | POA: Diagnosis not present

## 2023-03-09 DIAGNOSIS — R252 Cramp and spasm: Secondary | ICD-10-CM

## 2023-03-09 DIAGNOSIS — C50411 Malignant neoplasm of upper-outer quadrant of right female breast: Secondary | ICD-10-CM | POA: Diagnosis not present

## 2023-03-09 DIAGNOSIS — R293 Abnormal posture: Secondary | ICD-10-CM | POA: Insufficient documentation

## 2023-03-09 DIAGNOSIS — Z170 Estrogen receptor positive status [ER+]: Secondary | ICD-10-CM | POA: Insufficient documentation

## 2023-03-09 DIAGNOSIS — M25611 Stiffness of right shoulder, not elsewhere classified: Secondary | ICD-10-CM | POA: Diagnosis not present

## 2023-03-09 DIAGNOSIS — M6281 Muscle weakness (generalized): Secondary | ICD-10-CM | POA: Insufficient documentation

## 2023-03-11 ENCOUNTER — Ambulatory Visit: Payer: 59

## 2023-03-11 DIAGNOSIS — G8929 Other chronic pain: Secondary | ICD-10-CM

## 2023-03-11 DIAGNOSIS — M6281 Muscle weakness (generalized): Secondary | ICD-10-CM

## 2023-03-11 DIAGNOSIS — M25611 Stiffness of right shoulder, not elsewhere classified: Secondary | ICD-10-CM

## 2023-03-11 DIAGNOSIS — R252 Cramp and spasm: Secondary | ICD-10-CM

## 2023-03-11 NOTE — Therapy (Signed)
OUTPATIENT PHYSICAL THERAPY SHOULDER EVALUATION   Patient Name: Melissa Mcgrath MRN: [id number] DOB:1960-09-16, 62 y.o., female Today's Date: 03/11/2023  END OF SESSION:  PT End of Session - 03/11/23 1150     Visit Number 2    Date for PT Re-Evaluation 05/04/23    Authorization Type Jaye Beagle    PT Start Time 1150    PT Stop Time 1228    PT Time Calculation (min) 38 min    Activity Tolerance Patient tolerated treatment well    Behavior During Therapy [facility] for tasks assessed/performed             Past Medical History:  Diagnosis Date   Anemia    Depression 01/05/2014   Facial basal cell cancer    Family history of breast cancer    Family history of melanoma    Family history of ovarian cancer    Family history of pancreatic cancer    History of radiation therapy    right chest wall and subclavian 06/11/2021-07/26/2021  Dr Antony Blackbird   Menopause 01/05/2014   Port-A-Cath in place 10/04/2020   Vaginal atrophy 11/09/2014   Past Surgical History:  Procedure Laterality Date   BILATERAL TOTAL MASTECTOMY WITH AXILLARY LYMPH NODE DISSECTION Right 04/25/2021   Procedure: RIGHT TOTAL MASTECTOMY WITH RIGHT AXILLARY LYMPH NODE DISSECTION;  Surgeon: Almond Lint, MD;  Location: [facility];  Service: General;  Laterality: Right;   BREAST ENHANCEMENT SURGERY     BREAST IMPLANT REMOVAL Bilateral 04/25/2021   Procedure: REMOVAL BREAST IMPLANTS;  Surgeon: Almond Lint, MD;  Location: [facility];  Service: General;  Laterality: Bilateral;   CARPAL TUNNEL RELEASE Right    PORT-A-CATH REMOVAL N/A 10/15/2021   Procedure: REMOVAL PORT-A-CATH;  Surgeon: Almond Lint, MD;  Location: [facility];  Service: General;  Laterality: N/A;   PORTACATH PLACEMENT N/A 09/17/2020   Procedure: INSERTION PORT-A-CATH;  Surgeon: Almond Lint, MD;  Location: [facility];  Service: General;  Laterality: N/A;   refractive lensectomy Bilateral    SCAR REVISION Left 10/15/2021   Procedure: REVISION OF LEFT  MASTECTOMY SCAR;  Surgeon: Almond Lint, MD;  [facility]: [facility];  Service: General;  Laterality: Left;   TOTAL MASTECTOMY Left 04/25/2021   Procedure: LEFT TOTAL MASTECTOMY;  Surgeon: Almond Lint, MD;  [facility]: [facility];  Service: General;  Laterality: Left;   Patient Active Problem List   Diagnosis Date Noted   Chronic right shoulder pain 03/04/2023   Cough 10/17/2022   Encounter for lipid screening for cardiovascular disease 10/17/2022   Diabetes mellitus screening 10/17/2022   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   Genetic testing 09/18/2020   History of augmentation mammoplasty 08/31/2020   Family history of breast cancer    Family history of pancreatic cancer    Family history of melanoma    Family history of ovarian cancer    Malignant neoplasm of upper-outer quadrant of right breast in female, estrogen receptor positive (HCC) 08/28/2020   Vaginal atrophy 11/09/2014   Menopause 01/05/2014    PCP: Elenore Paddy, NP   REFERRING PROVIDER: Marisa Cyphers, MD   REFERRING DIAG: [phone] (ICD-10-CM) - Chronic right shoulder pain  Evaluate and treat for right shoulder rotator cuff tendinopathy and AC joint arthritis.   THERAPY DIAG:  Chronic right shoulder pain  Stiffness of right shoulder, not elsewhere classified  Cramp and spasm  Muscle weakness (generalized)  Rationale for Evaluation and Treatment: Rehabilitation  ONSET DATE: March 2024  SUBJECTIVE:  SUBJECTIVE STATEMENT: "Feels ok today.  No pain at the moment, just when I am doing things active" Hand dominance: Right  PERTINENT HISTORY: B mastectomy  PAIN:  Are you having pain? Yes: NPRS scale: 1  up to 8 with riding/10 Pain location: ant right shoulder to laterl and post spots Pain description: sharp and then aches Aggravating  factors: riding, vacuuming, sleeping, reaching OH to water flowers Relieving factors: ice, Meloxicam, rest  PRECAUTIONS: Other: R breast CA and subsequent surgeries  RED FLAGS: None   WEIGHT BEARING RESTRICTIONS: No  FALLS:  Has patient fallen in last 6 months? 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   PATIENT SURVEYS:  Quick Dash 52.3 / 100 = 52.3 %  COGNITION: Overall cognitive status: Within functional limits for tasks assessed     SENSATION: WFL  POSTURE: Mild R winging of scapula, mild fwd head  UPPER EXTREMITY ROM: Functional IR - thumb to T7 L, L1 R   A/P ROM Right eval Left eval  Shoulder flexion 145/165 170/180  Shoulder extension    Shoulder abduction 102/135 148/180  Shoulder adduction    Shoulder internal rotation 63/75 53/80  Shoulder external rotation 58/72   Elbow flexion    Elbow extension    (Blank rows = not tested)  UPPER EXTREMITY MMT:  MMT Right eval Left eval  Shoulder flexion 5 5  Shoulder extension 4- 4+  Shoulder abduction 4+ 5  Shoulder adduction    Shoulder internal rotation 4+ 5  Shoulder external rotation 5 5  Middle trapezius    Lower trapezius    Elbow flexion 5   Elbow extension    (Blank rows = not tested)  SHOULDER SPECIAL TESTS: Impingement tests: Neer impingement test: negative and Hawkins/Kennedy impingement test: positive   Rotator cuff assessment: Drop arm test: negative, Empty can test: negative, Full can test: negative, and Gerber lift off test: negative    PALPATION:  Left levator, UT, lats,  deltoids, pectorals   TODAY'S TREATMENT:                                                                                                                                         DATE: 03/11/23 UBE x 5 min (3/2) fwd/back Prone shoulder ext, rows and horizontal abduction with 2 lbs x 20 each right  Side lying ER with 2 lbs x 20 right Supine Serratus punch x 20 with 2 lbs right Supine shoulder alphabet A - Z  PROM right shoulder in supine/hook lying     DATE:  03/09/23 See pt ed and HEP   PATIENT EDUCATION: Education details: PT eval findings, anticipated POC, initial HEP, and role of DN  Person educated: Patient Education method: Explanation, Demonstration, and Handouts Education comprehension: verbalized understanding and returned demonstration  HOME EXERCISE PROGRAM: Access Code: [facility] URL: [email or web address] Date: 03/09/2023 Prepared by: Raynelle Fanning  Exercises - Supine Shoulder Flexion Extension AAROM with Dowel  - 1 x daily - 7 x weekly - 1-2 sets - 10 reps - 5 seconds hold - Supine Shoulder External Rotation with Dowel  - 1 x daily - 7 x weekly - 1-2 sets - 10 reps - 5 sseconds hold - Shoulder Scaption AAROM with Dowel  - 1 x daily - 7 x weekly - 1-2 sets - 10 reps - 3-5 seconds hold - Standing Shoulder Extension with Dowel  - 1 x daily - 7 x weekly - 3 sets - 10 reps - Standing Shoulder External Rotation Stretch in Doorway (Mirrored)  - 2 x daily - 7 x weekly - 1 sets - 3 reps - 15-30 sec hold  ASSESSMENT:  CLINICAL IMPRESSION: Melissa Mcgrath was able to tolerate addition of scapular stabilization activities today with 2 lbs with no increase in pain.  She had some discomfort with PROM on ER and flexion at end ranges but this subsided with easing into increased range slowly.   She would benefit from continued skilled PT to address end range motion deficits, shoulder strength and scapular stability.    OBJECTIVE IMPAIRMENTS: decreased activity tolerance, decreased ROM,  decreased strength, increased muscle spasms, impaired flexibility, impaired UE functional use, postural dysfunction, and pain.   ACTIVITY LIMITATIONS: carrying, lifting, sleeping, bathing, and reach over head  PARTICIPATION LIMITATIONS: yard work  PERSONAL FACTORS: Time since onset of injury/illness/exacerbation and 1 comorbidity: previous breast surgery  are also affecting patient's functional outcome.   REHAB POTENTIAL: Excellent  CLINICAL DECISION MAKING: Stable/uncomplicated  EVALUATION COMPLEXITY: Low   GOALS: Goals reviewed with patient? Yes  SHORT TERM GOALS: Target date: 04/06/2023   Patient will be independent with initial HEP.  Baseline:  Goal status: INITIAL   LONG TERM GOALS: Target date: 05/04/2023   Patient will be independent with advanced/ongoing HEP to improve outcomes and carryover.  Baseline:  Goal status: INITIAL  2.  Patient will report 75% improvement in R shoulder pain to improve QOL.  Baseline:  Goal status: INITIAL  3.  Patient will report 58 on Quick Dash to demonstrate improved functional ability.  Baseline: 52.3 / 100 = 52.3 % Goal status: INITIAL  4.  Patient to improve R shoulder AROM to  [facility]  without pain provocation to allow patient to perform her normal chores/ADLs.  Baseline:  Goal status: INITIAL  5.  Patient will demonstrate improved functional UE strength as demonstrated by 5/5 MMT. Baseline:  Goal status: INITIAL  6 .  Patient will be able to ride her horse with 75% less pain in her R shoulder.   Baseline:  Goal status: INITIAL   7. Patient able to sleep without waking from pain Baseline:  Goal status: INITIAL   PLAN:  PT FREQUENCY: 2x/week  PT DURATION: 8 weeks  PLANNED INTERVENTIONS: Therapeutic exercises, Therapeutic activity, Neuromuscular re-education, Patient/Family education, Self Care, Joint mobilization, Dry Needling, Electrical stimulation, Spinal mobilization, Cryotherapy, Moist heat, Taping, Ionotophoresis  4mg /ml Dexamethasone, and Manual therapy  PLAN FOR NEXT SESSION: Progress HEP, MT/DN to R UQ, R shoulder strength as tolerated, ROM   Emanuela Runnion B. Alayja Armas, PT 03/11/23 2:05 PM [facility]  [address], [address] [place], [place] [address] Phone # [phone] Fax [phone]

## 2023-03-16 ENCOUNTER — Ambulatory Visit: Payer: 59 | Admitting: Physical Therapy

## 2023-03-18 ENCOUNTER — Ambulatory Visit: Payer: 59 | Admitting: Physical Therapy

## 2023-03-18 ENCOUNTER — Encounter: Payer: Self-pay | Admitting: Physical Therapy

## 2023-03-18 DIAGNOSIS — M6281 Muscle weakness (generalized): Secondary | ICD-10-CM

## 2023-03-18 DIAGNOSIS — G8929 Other chronic pain: Secondary | ICD-10-CM

## 2023-03-18 DIAGNOSIS — Z483 Aftercare following surgery for neoplasm: Secondary | ICD-10-CM

## 2023-03-18 DIAGNOSIS — Z170 Estrogen receptor positive status [ER+]: Secondary | ICD-10-CM

## 2023-03-18 DIAGNOSIS — R252 Cramp and spasm: Secondary | ICD-10-CM

## 2023-03-18 DIAGNOSIS — M25612 Stiffness of left shoulder, not elsewhere classified: Secondary | ICD-10-CM

## 2023-03-18 DIAGNOSIS — M25611 Stiffness of right shoulder, not elsewhere classified: Secondary | ICD-10-CM

## 2023-03-18 DIAGNOSIS — R293 Abnormal posture: Secondary | ICD-10-CM

## 2023-03-18 NOTE — Therapy (Addendum)
OUTPATIENT PHYSICAL THERAPY SHOULDER EVALUATION   Patient Name: Melissa Mcgrath MRN: [id number] DOB:January 12, 1961, 62 y.o., female Today's Date: 03/18/2023  END OF SESSION:  PT End of Session - 03/18/23 1333     Visit Number 3    Date for PT Re-Evaluation 05/04/23    Authorization Type Jaye Beagle    PT Start Time 1226    PT Stop Time 1310    PT Time Calculation (min) 44 min    Activity Tolerance Patient tolerated treatment well    Behavior During Therapy [facility] for tasks assessed/performed              Past Medical History:  Diagnosis Date   Anemia    Depression 01/05/2014   Facial basal cell cancer    Family history of breast cancer    Family history of melanoma    Family history of ovarian cancer    Family history of pancreatic cancer    History of radiation therapy    right chest wall and subclavian 06/11/2021-07/26/2021  Dr Antony Blackbird   Menopause 01/05/2014   Port-A-Cath in place 10/04/2020   Vaginal atrophy 11/09/2014   Past Surgical History:  Procedure Laterality Date   BILATERAL TOTAL MASTECTOMY WITH AXILLARY LYMPH NODE DISSECTION Right 04/25/2021   Procedure: RIGHT TOTAL MASTECTOMY WITH RIGHT AXILLARY LYMPH NODE DISSECTION;  Surgeon: Almond Lint, MD;  [facility];  Service: General;  Laterality: Right;   BREAST ENHANCEMENT SURGERY     BREAST IMPLANT REMOVAL Bilateral 04/25/2021   Procedure: REMOVAL BREAST IMPLANTS;  Surgeon: Almond Lint, MD;  [facility];  Service: General;  Laterality: Bilateral;   CARPAL TUNNEL RELEASE Right    PORT-A-CATH REMOVAL N/A 10/15/2021   Procedure: REMOVAL PORT-A-CATH;  Surgeon: Almond Lint, MD;  [facility];  Service: General;  Laterality: N/A;   PORTACATH PLACEMENT N/A 09/17/2020   Procedure: INSERTION PORT-A-CATH;  Surgeon: Almond Lint, MD;  Location: [facility];  Service: General;  Laterality: N/A;   refractive lensectomy Bilateral    SCAR REVISION Left 10/15/2021   Procedure: REVISION OF LEFT  MASTECTOMY SCAR;  Surgeon: Almond Lint, MD;  [facility];  Service: General;  Laterality: Left;   TOTAL MASTECTOMY Left 04/25/2021   Procedure: LEFT TOTAL MASTECTOMY;  Surgeon: Almond Lint, MD;  [facility];  Service: General;  Laterality: Left;   Patient Active Problem List   Diagnosis Date Noted   Chronic right shoulder pain 03/04/2023   Cough 10/17/2022   Encounter for lipid screening for cardiovascular disease 10/17/2022   Diabetes mellitus screening 10/17/2022   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   Genetic testing 09/18/2020   History of augmentation mammoplasty 08/31/2020   Family history of breast cancer    Family history of pancreatic cancer    Family history of melanoma    Family history of ovarian cancer    Malignant neoplasm of upper-outer quadrant of right breast in female, estrogen receptor positive (HCC) 08/28/2020   Vaginal atrophy 11/09/2014   Menopause 01/05/2014    PCP: Elenore Paddy, NP   REFERRING PROVIDER: Marisa Cyphers, MD   REFERRING DIAG: [phone] (ICD-10-CM) - Chronic right shoulder pain  Evaluate and treat for right shoulder rotator cuff tendinopathy and AC joint arthritis.   THERAPY DIAG:  Chronic right shoulder pain  Stiffness of right shoulder, not elsewhere classified  Cramp and spasm  Abnormal posture  Muscle weakness (generalized)  Aftercare following surgery for neoplasm  Stiffness of left shoulder, not elsewhere classified  Malignant neoplasm of upper-outer quadrant of right breast in female, estrogen receptor positive [facility])  Rationale for Evaluation and Treatment: Rehabilitation  ONSET DATE: March 2024  SUBJECTIVE:                                                                                                                                                                                      SUBJECTIVE STATEMENT: Patient reports she is doing good today. Her arm feels sore after stuffing hay bells this weekend  but she is not having any pain currently. Patient verbalized she is able to reach and wash her Lt arm with a loofah now.  Hand dominance: Right  PERTINENT HISTORY: B mastectomy; Rt Axillary node dissection and radiation   PAIN: 03/18/2023 Are you having pain? Yes: NPRS scale: 0/10 Pain location: ant right shoulder to laterl and post spots Pain description: sharp and then aches Aggravating factors: riding, vacuuming, sleeping, reaching OH to water flowers Relieving factors: ice, Meloxicam, rest  PRECAUTIONS: Other: R breast CA and subsequent surgeries; high risk Rt arm lymphedema; no repetitive exercise with Rt arm; no heat/ice to Rt arm  RED FLAGS: None   WEIGHT BEARING RESTRICTIONS: No  FALLS:  Has patient fallen in last 6 months? 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   PATIENT SURVEYS:  Quick Dash 52.3 / 100 = 52.3 %  COGNITION: Overall cognitive status: Within functional limits for tasks assessed     SENSATION: WFL  POSTURE: Mild R winging of scapula, mild fwd head  UPPER EXTREMITY ROM: Functional IR - thumb to T7 L, L1 R   A/P ROM Right eval Left eval  Shoulder flexion 145/165 170/180  Shoulder extension    Shoulder abduction 102/135 148/180  Shoulder adduction    Shoulder internal rotation 63/75 53/80  Shoulder external rotation 58/72   Elbow flexion    Elbow extension    (Blank rows = not tested)  UPPER EXTREMITY MMT:  MMT Right eval Left eval  Shoulder flexion 5 5  Shoulder extension 4- 4+  Shoulder abduction 4+ 5  Shoulder adduction     Shoulder internal rotation 4+ 5  Shoulder external rotation 5 5  Middle trapezius    Lower trapezius    Elbow flexion 5   Elbow extension    (Blank  rows = not tested)  SHOULDER SPECIAL TESTS: Impingement tests: Neer impingement test: negative and Hawkins/Kennedy impingement test: positive   Rotator cuff assessment: Drop arm test: negative, Empty can test: negative, Full can test: negative, and Gerber lift off test: negative    PALPATION:  Left levator, UT, lats, deltoids, pectorals   TODAY'S TREATMENT:                                                                                                                                         DATE:  03/18/23 UBE x 5 min (3/2) fwd/back Prone shoulder ext, rows and horizontal abduction with 2 lbs 2x10 each right  Side lying ER with 2 lbs x 20 right Supine Serratus punch x 20 with 2 lbs right Supine shoulder alphabet A - Z  Wall Push Ups 2x10 Wall Circles (clockwise/ counter clockwise) 2x10 3 way Scap stabilization 2x10 Green loop Wall Washes (flexion & adduction) 2x20 Shoulder ER with [place] theraband 2x20 PROM right shoulder in supine/hook lying      03/11/23 UBE x 5 min (3/2) fwd/back Prone shoulder ext, rows and horizontal abduction with 2 lbs x 20 each right  Side lying ER with 2 lbs x 20 right Supine Serratus punch x 20 with 2 lbs right Supine shoulder alphabet A - Z  PROM right shoulder in supine/hook lying     DATE:  03/09/23 See pt ed and HEP   PATIENT EDUCATION: Education details: PT eval findings, anticipated POC, initial HEP, and role of DN  Person educated: Patient Education method: Explanation, Demonstration, and Handouts Education comprehension: verbalized understanding and returned demonstration  HOME EXERCISE PROGRAM: Access Code: [facility] URL: [email or web address] Date: 03/09/2023 Prepared by: Raynelle Fanning  Exercises - Supine Shoulder Flexion Extension AAROM with Dowel  - 1 x daily - 7 x  weekly - 1-2 sets - 10 reps - 5 seconds hold - Supine Shoulder External Rotation with Dowel  - 1 x daily - 7 x weekly - 1-2 sets - 10 reps - 5 sseconds hold - Shoulder Scaption AAROM with Dowel  - 1 x daily - 7 x weekly - 1-2 sets - 10 reps - 3-5 seconds hold - Standing Shoulder Extension with Dowel  - 1 x daily - 7 x weekly - 3 sets - 10 reps - Standing Shoulder External Rotation Stretch in Doorway (Mirrored)  - 2 x daily - 7 x weekly - 1 sets - 3 reps - 15-30 sec hold  ASSESSMENT:  CLINICAL IMPRESSION: Today's treatment session focused on shoulder strengthening and ROM. Patient tolerated treatment session well and required adequate rest breaks due to muscle fatigue. Patient verbalized being able to wash her Lt arm with her Rt arm now without difficulty. Required verbal cues for correct form while performing shoulder ER with theraband. Patient responded well to manual therapy and verbalized improved motion and decreased stiffness in Rt shoulder. Patient will benefit from  skilled PT to address the below impairments and improve overall function.   OBJECTIVE IMPAIRMENTS: decreased activity tolerance, decreased ROM, decreased strength, increased muscle spasms, impaired flexibility, impaired UE functional use, postural dysfunction, and pain.   ACTIVITY LIMITATIONS: carrying, lifting, sleeping, bathing, and reach over head  PARTICIPATION LIMITATIONS: yard work  PERSONAL FACTORS: Time since onset of injury/illness/exacerbation and 1 comorbidity: previous breast surgery  are also affecting patient's functional outcome.   REHAB POTENTIAL: Excellent  CLINICAL DECISION MAKING: Stable/uncomplicated  EVALUATION COMPLEXITY: Low   GOALS: Goals reviewed with patient? Yes  SHORT TERM GOALS: Target date: 04/06/2023   Patient will be independent with initial HEP.  Baseline:  Goal status: INITIAL   LONG TERM GOALS: Target date: 05/04/2023   Patient will be independent with advanced/ongoing HEP to  improve outcomes and carryover.  Baseline:  Goal status: INITIAL  2.  Patient will report 75% improvement in R shoulder pain to improve QOL.  Baseline:  Goal status: INITIAL  3.  Patient will report 70 on Quick Dash to demonstrate improved functional ability.  Baseline: 52.3 / 100 = 52.3 % Goal status: INITIAL  4.  Patient to improve R shoulder AROM to  [facility]  without pain provocation to allow patient to perform her normal chores/ADLs.  Baseline:  Goal status: INITIAL  5.  Patient will demonstrate improved functional UE strength as demonstrated by 5/5 MMT. Baseline:  Goal status: INITIAL  6 .  Patient will be able to ride her horse with 75% less pain in her R shoulder.   Baseline:  Goal status: INITIAL   7. Patient able to sleep without waking from pain Baseline:  Goal status: INITIAL   PLAN:  PT FREQUENCY: 2x/week  PT DURATION: 8 weeks  PLANNED INTERVENTIONS: Therapeutic exercises, Therapeutic activity, Neuromuscular re-education, Patient/Family education, Self Care, Joint mobilization, Dry Needling, Electrical stimulation, Spinal mobilization, Cryotherapy, Moist heat, Taping, Ionotophoresis 4mg /ml Dexamethasone, and Manual therapy  PLAN FOR NEXT SESSION: continue manual as needed; continue progressing shoulder and postural strengthening    Claude Manges, PT 03/18/23 1:34 PM  [facility] [address] [place], [place] [address] Phone # [phone] Fax [phone]

## 2023-03-23 ENCOUNTER — Ambulatory Visit: Payer: 59 | Admitting: Physical Therapy

## 2023-03-23 ENCOUNTER — Encounter: Payer: Self-pay | Admitting: Physical Therapy

## 2023-03-23 DIAGNOSIS — G8929 Other chronic pain: Secondary | ICD-10-CM | POA: Diagnosis not present

## 2023-03-23 DIAGNOSIS — Z483 Aftercare following surgery for neoplasm: Secondary | ICD-10-CM

## 2023-03-23 DIAGNOSIS — M6281 Muscle weakness (generalized): Secondary | ICD-10-CM

## 2023-03-23 DIAGNOSIS — R293 Abnormal posture: Secondary | ICD-10-CM

## 2023-03-23 DIAGNOSIS — M25611 Stiffness of right shoulder, not elsewhere classified: Secondary | ICD-10-CM

## 2023-03-23 DIAGNOSIS — Z170 Estrogen receptor positive status [ER+]: Secondary | ICD-10-CM

## 2023-03-23 DIAGNOSIS — R252 Cramp and spasm: Secondary | ICD-10-CM

## 2023-03-23 NOTE — Therapy (Signed)
OUTPATIENT PHYSICAL THERAPY SHOULDER EVALUATION   Patient Name: Melissa Mcgrath MRN: [id number] DOB:Oct 12, 1960, 62 y.o., female Today's Date: 03/23/2023  END OF SESSION:  PT End of Session - 03/23/23 1319     Visit Number 4    Date for PT Re-Evaluation 05/04/23    Authorization Type Jaye Beagle    PT Start Time 1230    PT Stop Time 1310    PT Time Calculation (min) 40 min    Activity Tolerance Patient tolerated treatment well    Behavior During Therapy [facility] for tasks assessed/performed               Past Medical History:  Diagnosis Date   Anemia    Depression 01/05/2014   Facial basal cell cancer    Family history of breast cancer    Family history of melanoma    Family history of ovarian cancer    Family history of pancreatic cancer    History of radiation therapy    right chest wall and subclavian 06/11/2021-07/26/2021  Dr Antony Blackbird   Menopause 01/05/2014   Port-A-Cath in place 10/04/2020   Vaginal atrophy 11/09/2014   Past Surgical History:  Procedure Laterality Date   BILATERAL TOTAL MASTECTOMY WITH AXILLARY LYMPH NODE DISSECTION Right 04/25/2021   Procedure: RIGHT TOTAL MASTECTOMY WITH RIGHT AXILLARY LYMPH NODE DISSECTION;  Surgeon: Almond Lint, MD;  Location: [facility];  Service: General;  Laterality: Right;   BREAST ENHANCEMENT SURGERY     BREAST IMPLANT REMOVAL Bilateral 04/25/2021   Procedure: REMOVAL BREAST IMPLANTS;  Surgeon: Almond Lint, MD;  Location: [facility];  Service: General;  Laterality: Bilateral;   CARPAL TUNNEL RELEASE Right    PORT-A-CATH REMOVAL N/A 10/15/2021   Procedure: REMOVAL PORT-A-CATH;  Surgeon: Almond Lint, MD;  [facility]: [facility];  Service: General;  Laterality: N/A;   PORTACATH PLACEMENT N/A 09/17/2020   Procedure: INSERTION PORT-A-CATH;  Surgeon: Almond Lint, MD;  Location: [facility];  Service: General;  Laterality: N/A;   refractive lensectomy Bilateral    SCAR REVISION Left 10/15/2021   Procedure: REVISION OF LEFT  MASTECTOMY SCAR;  Surgeon: Almond Lint, MD;  [facility];  Service: General;  Laterality: Left;   TOTAL MASTECTOMY Left 04/25/2021   Procedure: LEFT TOTAL MASTECTOMY;  Surgeon: Almond Lint, MD;  [facility];  Service: General;  Laterality: Left;   Patient Active Problem List   Diagnosis Date Noted   Chronic right shoulder pain 03/04/2023   Cough 10/17/2022   Encounter for lipid screening for cardiovascular disease 10/17/2022   Diabetes mellitus screening 10/17/2022   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   Genetic testing 09/18/2020   History of augmentation mammoplasty 08/31/2020   Family history of breast cancer    Family history of pancreatic cancer    Family history of melanoma    Family history of ovarian cancer    Malignant neoplasm of upper-outer quadrant of right breast in female, estrogen receptor positive (HCC) 08/28/2020   Vaginal atrophy 11/09/2014   Menopause 01/05/2014    PCP: Elenore Paddy, NP   REFERRING PROVIDER: Marisa Cyphers, MD   REFERRING DIAG: [phone] (ICD-10-CM) - Chronic right shoulder pain  Evaluate and treat for right shoulder rotator cuff tendinopathy and AC joint arthritis.   THERAPY DIAG:  Chronic right shoulder pain  Stiffness of right shoulder, not elsewhere classified  Cramp and spasm  Malignant neoplasm of upper-outer quadrant of right breast in female, estrogen receptor positive (HCC)  Abnormal posture  Muscle weakness (  generalized)  Aftercare following surgery for neoplasm  Rationale for Evaluation and Treatment: Rehabilitation  ONSET DATE: March 2024  SUBJECTIVE:                                                                                                                                                                                      SUBJECTIVE STATEMENT: Patient reports she is doing good today. Her arm is a little sore today from moving for 6 hours but no pain.  Hand dominance: Right  PERTINENT  HISTORY: B mastectomy; Rt Axillary node dissection and radiation   PAIN: 03/18/2023 Are you having pain? Yes: NPRS scale: 0/10 Pain location: ant right shoulder to laterl and post spots Pain description: sharp and then aches Aggravating factors: riding, vacuuming, sleeping, reaching OH to water flowers Relieving factors: ice, Meloxicam, rest  PRECAUTIONS: Other: R breast CA and subsequent surgeries; high risk Rt arm lymphedema; no repetitive exercise with Rt arm; no heat/ice to Rt arm  RED FLAGS: None   WEIGHT BEARING RESTRICTIONS: No  FALLS:  Has patient fallen in last 6 months? 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   PATIENT SURVEYS:  Quick Dash 52.3 / 100 = 52.3 %  COGNITION: Overall cognitive status: Within functional limits for tasks assessed     SENSATION: WFL  POSTURE: Mild R winging of scapula, mild fwd head  UPPER EXTREMITY ROM: Functional IR - thumb to T7 L, L1 R   A/P ROM Right eval Left eval  Shoulder flexion 145/165 170/180  Shoulder extension    Shoulder abduction 102/135 148/180  Shoulder adduction    Shoulder internal rotation 63/75 53/80  Shoulder external rotation 58/72   Elbow flexion    Elbow extension    (Blank rows = not tested)  UPPER EXTREMITY MMT:  MMT Right eval Left eval  Shoulder flexion 5 5  Shoulder extension 4- 4+  Shoulder abduction 4+ 5  Shoulder adduction    Shoulder internal rotation 4+ 5  Shoulder external rotation 5 5  Middle trapezius    Lower trapezius    Elbow flexion 5   Elbow extension    (Blank  rows = not tested)  SHOULDER SPECIAL TESTS: Impingement tests: Neer impingement test: negative and Hawkins/Kennedy impingement test: positive   Rotator cuff assessment: Drop arm  test: negative, Empty can test: negative, Full can test: negative, and Gerber lift off test: negative    PALPATION:  Left levator, UT, lats, deltoids, pectorals   TODAY'S TREATMENT:                                                                                                                                         DATE:  03/18/23 Prone shoulder ext, rows and horizontal abduction with 3 lbs 2x10 each right  Side lying ER with 2 lbs x 20 right Supine Serratus punch x 20 with 2 lbs right Wall Push Ups 2x10 3 way Scap stabilization 2x10 Blue loop Shoulder Rows at cable column 2x10 10# ER Walk Outs at cable column 5# x5 Lat Pull Downs 15lbs [employer] Carries unilateral DB hold 5# x4 Wall Washes (flexion & adduction) 2x10 Bent Over Rows 2x10 4# Rt UE PROM right shoulder in supine/hook lying     03/11/23 UBE x 5 min (3/2) fwd/back Prone shoulder ext, rows and horizontal abduction with 2 lbs x 20 each right  Side lying ER with 2 lbs x 20 right Supine Serratus punch x 20 with 2 lbs right Supine shoulder alphabet A - Z  PROM right shoulder in supine/hook lying     DATE:  03/09/23 See pt ed and HEP   PATIENT EDUCATION: Education details: PT eval findings, anticipated POC, initial HEP, and role of DN  Person educated: Patient Education method: Explanation, Demonstration, and Handouts Education comprehension: verbalized understanding and returned demonstration  HOME EXERCISE PROGRAM: Access Code: [facility] URL: [email or web address] Date: 03/09/2023 Prepared by: Raynelle Fanning  Exercises - Supine Shoulder Flexion Extension AAROM with Dowel  - 1 x daily - 7 x weekly - 1-2 sets - 10 reps - 5 seconds hold - Supine Shoulder External Rotation with Dowel  - 1 x daily - 7 x weekly - 1-2 sets - 10 reps - 5  sseconds hold - Shoulder Scaption AAROM with Dowel  - 1 x daily - 7 x weekly - 1-2 sets - 10 reps - 3-5 seconds hold - Standing Shoulder Extension with Dowel  - 1 x daily - 7 x weekly - 3 sets - 10 reps - Standing Shoulder External Rotation Stretch in Doorway (Mirrored)  - 2 x daily - 7 x weekly - 1 sets - 3 reps - 15-30 sec hold  ASSESSMENT:  CLINICAL IMPRESSION: Today's treatment session focused on shoulder strengthening and ROM. Patient tolerated treatment session well and was able to tolerate increased weight with prone shoulder strengthening exercises. ER walk out exercise was challenging for patient to maintain isometric shoulder ER. Progressed patient's shoulder strengthening exercises and she tolerated it well with adequate rest breaks. Patient will benefit from skilled PT to address the below impairments and improve overall function.   OBJECTIVE IMPAIRMENTS: decreased activity tolerance, decreased ROM, decreased strength, increased muscle spasms, impaired flexibility, impaired UE functional use, postural dysfunction,  and pain.   ACTIVITY LIMITATIONS: carrying, lifting, sleeping, bathing, and reach over head  PARTICIPATION LIMITATIONS: yard work  PERSONAL FACTORS: Time since onset of injury/illness/exacerbation and 1 comorbidity: previous breast surgery  are also affecting patient's functional outcome.   REHAB POTENTIAL: Excellent  CLINICAL DECISION MAKING: Stable/uncomplicated  EVALUATION COMPLEXITY: Low   GOALS: Goals reviewed with patient? Yes  SHORT TERM GOALS: Target date: 04/06/2023   Patient will be independent with initial HEP.  Baseline:  Goal status: INITIAL   LONG TERM GOALS: Target date: 05/04/2023   Patient will be independent with advanced/ongoing HEP to improve outcomes and carryover.  Baseline:  Goal status: INITIAL  2.  Patient will report 75% improvement in R shoulder pain to improve QOL.  Baseline:  Goal status: INITIAL  3.  Patient will report  70 on Quick Dash to demonstrate improved functional ability.  Baseline: 52.3 / 100 = 52.3 % Goal status: INITIAL  4.  Patient to improve R shoulder AROM to  [facility]  without pain provocation to allow patient to perform her normal chores/ADLs.  Baseline:  Goal status: INITIAL  5.  Patient will demonstrate improved functional UE strength as demonstrated by 5/5 MMT. Baseline:  Goal status: INITIAL  6 .  Patient will be able to ride her horse with 75% less pain in her R shoulder.   Baseline:  Goal status: INITIAL   7. Patient able to sleep without waking from pain Baseline:  Goal status: INITIAL   PLAN:  PT FREQUENCY: 2x/week  PT DURATION: 8 weeks  PLANNED INTERVENTIONS: Therapeutic exercises, Therapeutic activity, Neuromuscular re-education, Patient/Family education, Self Care, Joint mobilization, Dry Needling, Electrical stimulation, Spinal mobilization, Cryotherapy, Moist heat, Taping, Ionotophoresis 4mg /ml Dexamethasone, and Manual therapy  PLAN FOR NEXT SESSION: progress shoulder strengthening; incorporate more standing exercises.   Claude Manges, PT 03/23/23 1:20 PM  [facility] [address] [place], [place] [address] Phone # [phone] Fax [phone]

## 2023-03-25 ENCOUNTER — Encounter: Payer: Self-pay | Admitting: *Deleted

## 2023-03-25 ENCOUNTER — Ambulatory Visit: Payer: 59 | Attending: Sports Medicine

## 2023-03-25 DIAGNOSIS — M25611 Stiffness of right shoulder, not elsewhere classified: Secondary | ICD-10-CM | POA: Insufficient documentation

## 2023-03-25 DIAGNOSIS — M25612 Stiffness of left shoulder, not elsewhere classified: Secondary | ICD-10-CM | POA: Diagnosis present

## 2023-03-25 DIAGNOSIS — R252 Cramp and spasm: Secondary | ICD-10-CM | POA: Insufficient documentation

## 2023-03-25 DIAGNOSIS — M25511 Pain in right shoulder: Secondary | ICD-10-CM | POA: Diagnosis present

## 2023-03-25 DIAGNOSIS — G8929 Other chronic pain: Secondary | ICD-10-CM | POA: Insufficient documentation

## 2023-03-25 DIAGNOSIS — M6281 Muscle weakness (generalized): Secondary | ICD-10-CM | POA: Insufficient documentation

## 2023-03-25 NOTE — Progress Notes (Signed)
MD will be out of office on 10/11 in the afternoon and requesting pt appt to be changed to NP.  Appt changed, RN attempt x1 to contact pt.  No answer, LVM.

## 2023-03-25 NOTE — Therapy (Signed)
OUTPATIENT PHYSICAL THERAPY SHOULDER EVALUATION   Patient Name: Melissa Mcgrath MRN: [id number] DOB:May 29, 1961, 62 y.o., female Today's Date: 03/25/2023  END OF SESSION:  PT End of Session - 03/25/23 1101     Visit Number 5    Date for PT Re-Evaluation 05/04/23    Authorization Type Jaye Beagle    PT Start Time 1101    PT Stop Time 1132    PT Time Calculation (min) 31 min    Activity Tolerance Patient tolerated treatment well    Behavior During Therapy [facility] for tasks assessed/performed               Past Medical History:  Diagnosis Date   Anemia    Depression 01/05/2014   Facial basal cell cancer    Family history of breast cancer    Family history of melanoma    Family history of ovarian cancer    Family history of pancreatic cancer    History of radiation therapy    right chest wall and subclavian 06/11/2021-07/26/2021  Dr Antony Blackbird   Menopause 01/05/2014   Port-A-Cath in place 10/04/2020   Vaginal atrophy 11/09/2014   Past Surgical History:  Procedure Laterality Date   BILATERAL TOTAL MASTECTOMY WITH AXILLARY LYMPH NODE DISSECTION Right 04/25/2021   Procedure: RIGHT TOTAL MASTECTOMY WITH RIGHT AXILLARY LYMPH NODE DISSECTION;  Surgeon: Almond Lint, MD;  [facility];  Service: General;  Laterality: Right;   BREAST ENHANCEMENT SURGERY     BREAST IMPLANT REMOVAL Bilateral 04/25/2021   Procedure: REMOVAL BREAST IMPLANTS;  Surgeon: Almond Lint, MD;  [facility]: [facility];  Service: General;  Laterality: Bilateral;   CARPAL TUNNEL RELEASE Right    PORT-A-CATH REMOVAL N/A 10/15/2021   Procedure: REMOVAL PORT-A-CATH;  Surgeon: Almond Lint, MD;  [facility]: [facility];  Service: General;  Laterality: N/A;   PORTACATH PLACEMENT N/A 09/17/2020   Procedure: INSERTION PORT-A-CATH;  Surgeon: Almond Lint, MD;  Location: [facility];  Service: General;  Laterality: N/A;   refractive lensectomy Bilateral    SCAR REVISION Left 10/15/2021   Procedure: REVISION OF LEFT  MASTECTOMY SCAR;  Surgeon: Almond Lint, MD;  [facility]: [facility];  Service: General;  Laterality: Left;   TOTAL MASTECTOMY Left 04/25/2021   Procedure: LEFT TOTAL MASTECTOMY;  Surgeon: Almond Lint, MD;  [facility]: [facility];  Service: General;  Laterality: Left;   Patient Active Problem List   Diagnosis Date Noted   Chronic right shoulder pain 03/04/2023   Cough 10/17/2022   Encounter for lipid screening for cardiovascular disease 10/17/2022   Diabetes mellitus screening 10/17/2022   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   Genetic testing 09/18/2020   History of augmentation mammoplasty 08/31/2020   Family history of breast cancer    Family history of pancreatic cancer    Family history of melanoma    Family history of ovarian cancer    Malignant neoplasm of upper-outer quadrant of right breast in female, estrogen receptor positive (HCC) 08/28/2020   Vaginal atrophy 11/09/2014   Menopause 01/05/2014    PCP: Elenore Paddy, NP   REFERRING PROVIDER: Elenore Paddy, NP   REFERRING DIAG: [phone] (ICD-10-CM) - Chronic right shoulder pain  Evaluate and treat for right shoulder rotator cuff tendinopathy and AC joint arthritis.   THERAPY DIAG:  Chronic right shoulder pain  Stiffness of right shoulder, not elsewhere classified  Muscle weakness (generalized)  Cramp and spasm  Stiffness of left shoulder, not elsewhere classified  Rationale for Evaluation and Treatment: Rehabilitation  ONSET  DATE: March 2024  SUBJECTIVE:                                                                                                                                                                                      SUBJECTIVE STATEMENT: Patient reports she was at the ER all night with her brother so she is feeling pretty wiped out.   Hand dominance: Right  PERTINENT HISTORY: B mastectomy; Rt Axillary node dissection and radiation   PAIN: 03/25/2023 Are you having pain? Yes: NPRS scale: 0/10 Pain  location: ant right shoulder to laterl and post spots Pain description: sharp and then aches Aggravating factors: riding, vacuuming, sleeping, reaching OH to water flowers Relieving factors: ice, Meloxicam, rest  PRECAUTIONS: Other: R breast CA and subsequent surgeries; high risk Rt arm lymphedema; no repetitive exercise with Rt arm; no heat/ice to Rt arm  RED FLAGS: None   WEIGHT BEARING RESTRICTIONS: No  FALLS:  Has patient fallen in last 6 months? 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   PATIENT SURVEYS:  Quick Dash 52.3 / 100 = 52.3 %  COGNITION: Overall cognitive status: Within functional limits for tasks assessed     SENSATION: WFL  POSTURE: Mild R winging of scapula, mild fwd head  UPPER EXTREMITY ROM: Functional IR - thumb to T7 L, L1 R   A/P ROM Right eval Left eval  Shoulder flexion 145/165 170/180  Shoulder extension    Shoulder abduction 102/135 148/180  Shoulder adduction    Shoulder internal rotation 63/75 53/80  Shoulder external rotation 58/72   Elbow flexion    Elbow extension    (Blank rows = not tested)  UPPER EXTREMITY MMT:  MMT Right eval Left eval  Shoulder flexion 5 5  Shoulder extension 4- 4+  Shoulder abduction 4+ 5  Shoulder adduction    Shoulder internal rotation 4+ 5  Shoulder external rotation 5 5  Middle trapezius    Lower trapezius    Elbow flexion 5   Elbow extension    (Blank rows = not tested)  SHOULDER SPECIAL TESTS: Impingement tests: Neer impingement test: negative and Hawkins/Kennedy impingement test:  positive   Rotator cuff assessment: Drop arm test: negative, Empty can test: negative, Full can test: negative, and Gerber lift off test: negative  PALPATION:  Left levator, UT, lats, deltoids, pectorals   TODAY'S TREATMENT:                                                                                                                                         DATE:  03/25/23 (no UBE due to lymphedema) 3 way Scap stabilization 2x10 Blue loop (both) 4D ball rolls x 20 each direction (right) Lat Pull Downs 25lbs Doorway stretches x 10 hold 10 sec each Shoulder Rows at cable column 2x10 15# Standing shoulder flexion x 20 with 1 lb Standing shoulder scaption x 20 with 1 lb Standing bilateral shoulder ER with red band  Standing bilateral shoulder horizontal abduction x 20 with red band Fwd bent shoulder ext, rows and horizontal abduction with 3 lbs (2 lbs with horizontal abd) 2x10 each right  Side lying ER with 2 lbs x 20 right Supine Serratus punch x 20 with 2 lbs right  03/18/23 Prone shoulder ext, rows and horizontal abduction with 3 lbs 2x10 each right  Side lying ER with 2 lbs x 20 right Supine Serratus punch x 20 with 2 lbs right Wall Push Ups 2x10 3 way Scap stabilization 2x10 Blue loop Shoulder Rows at cable column 2x10 10# ER Walk Outs at cable column 5# x5 Lat Pull Downs 15lbs [employer] Carries unilateral DB hold 5# x4 Wall Washes (flexion & adduction) 2x10 Bent Over Rows 2x10 4# Rt UE PROM right shoulder in supine/hook lying     03/11/23 UBE x 5 min (3/2) fwd/back Prone shoulder ext, rows and horizontal abduction with 2 lbs x 20 each right  Side lying ER with 2 lbs x 20 right Supine Serratus punch x 20 with 2 lbs right Supine shoulder alphabet A - Z  PROM right shoulder in supine/hook lying     DATE:  03/09/23 See pt ed and HEP   PATIENT EDUCATION: Education details: PT eval findings, anticipated POC, initial HEP, and role of DN  Person educated:  Patient Education method: Explanation, Demonstration, and Handouts Education comprehension: verbalized understanding and returned demonstration  HOME EXERCISE PROGRAM: Access Code: [facility] URL: [email or web address] Date: 03/09/2023 Prepared by: Raynelle Fanning  Exercises - Supine Shoulder Flexion Extension AAROM with Dowel  - 1 x daily - 7 x weekly - 1-2 sets - 10 reps - 5 seconds hold - Supine Shoulder External Rotation with Dowel  - 1 x daily - 7 x weekly - 1-2 sets - 10 reps - 5 sseconds hold - Shoulder Scaption AAROM with Dowel  - 1 x daily - 7 x weekly - 1-2 sets - 10 reps - 3-5 seconds hold - Standing Shoulder Extension with Dowel  - 1 x daily - 7 x weekly - 3 sets - 10 reps - Standing Shoulder External Rotation Stretch in Doorway (Mirrored)  - 2 x daily - 7 x weekly - 1 sets - 3 reps - 15-30 sec hold  ASSESSMENT:  CLINICAL IMPRESSION: [place] is progressing appropriately.  She had very little pain to speak of today but did fatigue easily with side lying ER.  She does have some soft tissue "rolling/popping" with certain exercises.  This does not cause extra pain but may indicate tightness in pec or other anterior shoulder structures.   Patient will benefit from skilled PT to address the below impairments and improve overall function.   OBJECTIVE IMPAIRMENTS: decreased activity tolerance, decreased ROM, decreased strength, increased muscle spasms, impaired flexibility, impaired UE functional use, postural dysfunction, and pain.   ACTIVITY LIMITATIONS: carrying, lifting, sleeping, bathing, and reach over head  PARTICIPATION LIMITATIONS: yard work  PERSONAL FACTORS: Time since onset of injury/illness/exacerbation and 1 comorbidity: previous breast surgery  are also affecting patient's functional outcome.   REHAB POTENTIAL: Excellent  CLINICAL DECISION MAKING: Stable/uncomplicated  EVALUATION COMPLEXITY: Low   GOALS: Goals reviewed with patient? Yes  SHORT TERM GOALS:  Target date: 04/06/2023   Patient will be independent with initial HEP.  Baseline:  Goal status: MET 03/25/23   LONG TERM GOALS: Target date: 05/04/2023   Patient will be independent with advanced/ongoing HEP to improve outcomes and carryover.  Baseline:  Goal status: IN PROGRESS 03/25/23  2.  Patient will report 75% improvement in R shoulder pain to improve QOL.  Baseline:  Goal status: INITIAL  3.  Patient will report 23 on Quick Dash to demonstrate improved functional ability.  Baseline: 52.3 / 100 = 52.3 % Goal status: INITIAL  4.  Patient to improve R shoulder AROM to  [facility]  without pain provocation to allow patient to perform her normal chores/ADLs.  Baseline:  Goal status: INITIAL  5.  Patient will demonstrate improved functional UE strength as demonstrated by 5/5 MMT. Baseline:  Goal status: INITIAL  6 .  Patient will be able to ride her horse with 75% less pain in her R shoulder.   Baseline:  Goal status: INITIAL   7. Patient able to sleep without waking from pain Baseline:  Goal status: INITIAL   PLAN:  PT FREQUENCY: 2x/week  PT DURATION: 8 weeks  PLANNED INTERVENTIONS: Therapeutic exercises, Therapeutic activity, Neuromuscular re-education, Patient/Family education, Self Care, Joint mobilization, Dry Needling, Electrical stimulation, Spinal mobilization, Cryotherapy, Moist heat, Taping, Ionotophoresis 4mg /ml Dexamethasone, and Manual therapy  PLAN FOR NEXT SESSION: progress shoulder strengthening; incorporate more standing exercises.   Victorino Dike B. Wynetta Seith, PT 03/25/23 11:40 AM [facility] [address] [place], [place] [address] Phone # [phone] Fax [phone]

## 2023-03-30 ENCOUNTER — Encounter: Payer: Self-pay | Admitting: Physical Therapy

## 2023-03-30 ENCOUNTER — Ambulatory Visit: Payer: 59 | Admitting: Physical Therapy

## 2023-03-30 DIAGNOSIS — M6281 Muscle weakness (generalized): Secondary | ICD-10-CM

## 2023-03-30 DIAGNOSIS — M25611 Stiffness of right shoulder, not elsewhere classified: Secondary | ICD-10-CM

## 2023-03-30 DIAGNOSIS — R252 Cramp and spasm: Secondary | ICD-10-CM

## 2023-03-30 DIAGNOSIS — G8929 Other chronic pain: Secondary | ICD-10-CM

## 2023-03-30 DIAGNOSIS — M25511 Pain in right shoulder: Secondary | ICD-10-CM | POA: Diagnosis not present

## 2023-03-30 NOTE — Therapy (Signed)
OUTPATIENT PHYSICAL THERAPY SHOULDER TREATMENT   Patient Name: Melissa Mcgrath MRN: [id number] DOB:05/04/1961, 62 y.o., female Today's Date: 03/30/2023  END OF SESSION:  PT End of Session - 03/30/23 1150     Visit Number 6    Date for PT Re-Evaluation 05/04/23    Authorization Type Jaye Beagle    PT Start Time 1104    PT Stop Time 1142    PT Time Calculation (min) 38 min    Activity Tolerance Patient tolerated treatment well    Behavior During Therapy [facility] for tasks assessed/performed                Past Medical History:  Diagnosis Date   Anemia    Depression 01/05/2014   Facial basal cell cancer    Family history of breast cancer    Family history of melanoma    Family history of ovarian cancer    Family history of pancreatic cancer    History of radiation therapy    right chest wall and subclavian 06/11/2021-07/26/2021  Dr Antony Blackbird   Menopause 01/05/2014   Port-A-Cath in place 10/04/2020   Vaginal atrophy 11/09/2014   Past Surgical History:  Procedure Laterality Date   BILATERAL TOTAL MASTECTOMY WITH AXILLARY LYMPH NODE DISSECTION Right 04/25/2021   Procedure: RIGHT TOTAL MASTECTOMY WITH RIGHT AXILLARY LYMPH NODE DISSECTION;  Surgeon: Almond Lint, MD;  [facility];  Service: General;  Laterality: Right;   BREAST ENHANCEMENT SURGERY     BREAST IMPLANT REMOVAL Bilateral 04/25/2021   Procedure: REMOVAL BREAST IMPLANTS;  Surgeon: Almond Lint, MD;  [facility];  Service: General;  Laterality: Bilateral;   CARPAL TUNNEL RELEASE Right    PORT-A-CATH REMOVAL N/A 10/15/2021   Procedure: REMOVAL PORT-A-CATH;  Surgeon: Almond Lint, MD;  [facility];  Service: General;  Laterality: N/A;   PORTACATH PLACEMENT N/A 09/17/2020   Procedure: INSERTION PORT-A-CATH;  Surgeon: Almond Lint, MD;  Location: [facility];  Service: General;  Laterality: N/A;   refractive lensectomy Bilateral    SCAR REVISION Left 10/15/2021   Procedure: REVISION OF  LEFT MASTECTOMY SCAR;  Surgeon: Almond Lint, MD;  [facility];  Service: General;  Laterality: Left;   TOTAL MASTECTOMY Left 04/25/2021   Procedure: LEFT TOTAL MASTECTOMY;  Surgeon: Almond Lint, MD;  [facility];  Service: General;  Laterality: Left;   Patient Active Problem List   Diagnosis Date Noted   Chronic right shoulder pain 03/04/2023   Cough 10/17/2022   Encounter for lipid screening for cardiovascular disease 10/17/2022   Diabetes mellitus screening 10/17/2022   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   Genetic testing 09/18/2020   History of augmentation mammoplasty 08/31/2020   Family history of breast cancer    Family history of pancreatic cancer    Family history of melanoma    Family history of ovarian cancer    Malignant neoplasm of upper-outer quadrant of right breast in female, estrogen receptor positive (HCC) 08/28/2020   Vaginal atrophy 11/09/2014   Menopause 01/05/2014    PCP: Elenore Paddy, NP   REFERRING PROVIDER: Marisa Cyphers, MD   REFERRING DIAG: [phone] (ICD-10-CM) - Chronic right shoulder pain  Evaluate and treat for right shoulder rotator cuff tendinopathy and AC joint arthritis.   THERAPY DIAG:  Chronic right shoulder pain  Stiffness of right shoulder, not elsewhere classified  Muscle weakness (generalized)  Cramp and spasm  Rationale for Evaluation and Treatment: Rehabilitation  ONSET DATE: March 2024  SUBJECTIVE:  SUBJECTIVE STATEMENT: Patient reports she is having some pain in her Rt chest that she woke up with. 5/10 pain.  Hand dominance: Right  PERTINENT HISTORY: B mastectomy; Rt Axillary node dissection and radiation   PAIN: 03/30/2023 Are you having pain? Yes: NPRS scale: 5/10 Pain location: ant right shoulder to laterl and post  spots Pain description: sharp and then aches Aggravating factors: riding, vacuuming, sleeping, reaching OH to water flowers Relieving factors: ice, Meloxicam, rest  PRECAUTIONS: Other: R breast CA and subsequent surgeries; high risk Rt arm lymphedema; no repetitive exercise with Rt arm; no heat/ice to Rt arm  RED FLAGS: None   WEIGHT BEARING RESTRICTIONS: No  FALLS:  Has patient fallen in last 6 months? 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   PATIENT SURVEYS:  Quick Dash 52.3 / 100 = 52.3 %  COGNITION: Overall cognitive status: Within functional limits for tasks assessed     SENSATION: WFL  POSTURE: Mild R winging of scapula, mild fwd head  UPPER EXTREMITY ROM: Functional IR - thumb to T7 L, L1 R   A/P ROM Right eval Left eval  Shoulder flexion 145/165 170/180  Shoulder extension    Shoulder abduction 102/135 148/180  Shoulder adduction    Shoulder internal rotation 63/75 53/80  Shoulder external rotation 58/72   Elbow flexion    Elbow extension    (Blank rows = not tested)  UPPER EXTREMITY MMT:  MMT Right eval Left eval  Shoulder flexion 5 5  Shoulder extension 4- 4+  Shoulder abduction 4+ 5  Shoulder adduction    Shoulder internal rotation 4+ 5  Shoulder external rotation 5 5  Middle trapezius    Lower trapezius    Elbow flexion 5   Elbow extension    (Blank rows = not tested)  SHOULDER SPECIAL TESTS: Impingement tests: Neer impingement test: negative and Hawkins/Kennedy impingement test: positive   Rotator cuff assessment: Drop arm  test: negative, Empty can test: negative, Full can test: negative, and Gerber lift off test: negative    PALPATION:  Left levator, UT, lats, deltoids, pectorals   TODAY'S TREATMENT:                                                                                                                                         DATE:  03/30/23 (no UBE due to lymphedema) Doorway stretches x 10 hold 10 sec each Posterior Shoulder Capsule 2 x 30 sec 3 way Scap stabilization 2x10 Blue loop (both) 4D ball rolls x 20 each direction (right)  Lat Pull Downs 25lbs 2 x 10 Shoulder Rows at cable column 2x10 15# Shoulder Extension at cable column 2x10  Standing shoulder flexion with Iso shoulder abduction with blue loop 2 x 10 Standing shoulder flexion x 20 with 1 lb Standing shoulder scaption x 20 with 1 lb Standing bilateral shoulder ER with red band  Standing bilateral shoulder horizontal abduction x 20 with red band Fwd bent standing shoulder ext, rows and horizontal abduction with 3 lbs (2 lbs with horizontal abd) 2x10 each right  Side lying ER with 2 lbs x 10 right (increased pain and discomfort) Supine Serratus punch x 20 with 2 lbs right   03/25/23 (no UBE due to lymphedema) 3 way Scap stabilization 2x10 Blue loop (both) 4D ball rolls x 20 each direction (right) Lat Pull Downs 25lbs Doorway stretches x 10 hold 10 sec each Shoulder Rows at cable column 2x10 15# Standing shoulder flexion x 20 with 1 lb Standing shoulder scaption x 20 with 1 lb Standing bilateral shoulder ER with red band  Standing bilateral shoulder horizontal abduction x 20 with red band Fwd bent shoulder ext, rows and horizontal abduction with 3 lbs (2 lbs with horizontal abd) 2x10 each right  Side lying ER with 2 lbs x 20 right Supine Serratus punch x 20 with 2 lbs right  03/18/23 Prone shoulder ext, rows and horizontal abduction with 3 lbs 2x10 each right  Side lying ER with 2 lbs x 20 right Supine Serratus punch x 20 with  2 lbs right Wall Push Ups 2x10 3 way Scap stabilization 2x10 Blue loop Shoulder Rows at cable column 2x10 10# ER Walk Outs at cable column 5# x5 Lat Pull Downs 15lbs [employer] unilateral DB hold 5# x4 Wall Washes (flexion & adduction) 2x10 Bent Over Rows 2x10 4# Rt UE PROM right shoulder in supine/hook lying     DATE:  03/09/23 See pt ed and HEP   PATIENT EDUCATION: Education details: PT eval findings, anticipated POC, initial HEP, and role of DN  Person educated: Patient Education method: Explanation, Demonstration, and Handouts Education comprehension: verbalized understanding and returned demonstration  HOME EXERCISE PROGRAM: Access Code: [facility] URL: [email or web address] Date: 03/30/2023 Prepared by: Claude Manges  Exercises - Supine Shoulder Flexion Extension AAROM with Dowel  - 1 x daily - 7 x weekly - 1-2 sets - 10 reps - 5 seconds hold - Supine Shoulder External Rotation with Dowel  - 1 x daily - 7 x weekly - 1-2 sets - 10 reps - 5 sseconds hold - Shoulder Scaption AAROM with Dowel  - 1 x daily - 7 x weekly - 1-2 sets - 10 reps - 3-5 seconds hold - Standing Shoulder Extension with Dowel  - 1 x daily - 7 x weekly - 3 sets - 10 reps - Standing Shoulder External Rotation Stretch in Doorway (Mirrored)  - 2 x daily - 7 x weekly - 1 sets - 3 reps - 15-30 sec hold - Prone Shoulder Extension - Single Arm  - 2 x daily - 7 x weekly - 2 sets - 10 reps - Prone Shoulder Row  - 2 x daily - 7 x weekly - 2 sets - 10 reps - Prone Single Arm Shoulder Horizontal Abduction with Scapular Retraction and Palm Down  - 2 x daily - 7 x weekly - 2 sets - 10 reps - Sidelying Shoulder External Rotation  - 2 x daily - 7 x weekly - 2 sets - 10 reps - Single Arm Serratus  Punches in Supine with Dumbbell  - 2 x daily - 7 x weekly - 2 sets - 10 reps - Standing Shoulder Flexion to 90 Degrees with Dumbbells  - 2 x daily - 7 x weekly - 2 sets - 10 reps - Standing Shoulder Scaption  - 2 x  daily - 7 x weekly - 2 sets - 10 reps - Shoulder External Rotation and Scapular Retraction with Resistance  - 1 x daily - 7 x weekly - 2 sets - 10 reps - Standing Shoulder Horizontal Abduction with Resistance  - 1 x daily - 7 x weekly - 2 sets - 10 reps - Standing Shoulder Row with Anchored Resistance  - 1 x daily - 7 x weekly - 2 sets - 10 reps  ASSESSMENT:  CLINICAL IMPRESSION: Today's treatment session focused on shoulder stability and strengthening. Incorporated overhead shoulder shoulder exercises for improved scapular stability with reaching. Patient tolerated treatment session well and verbalized some discomfort while performing side lying ER exercise. Discontinued performing exercise during treatment session. Updated patient's HEP to include Theraband shoulder stabilizing exercises. Patient was able to tolerate more exercises in standing. Patient will benefit from skilled PT to address the below impairments and improve overall function.   OBJECTIVE IMPAIRMENTS: decreased activity tolerance, decreased ROM, decreased strength, increased muscle spasms, impaired flexibility, impaired UE functional use, postural dysfunction, and pain.   ACTIVITY LIMITATIONS: carrying, lifting, sleeping, bathing, and reach over head  PARTICIPATION LIMITATIONS: yard work  PERSONAL FACTORS: Time since onset of injury/illness/exacerbation and 1 comorbidity: previous breast surgery  are also affecting patient's functional outcome.   REHAB POTENTIAL: Excellent  CLINICAL DECISION MAKING: Stable/uncomplicated  EVALUATION COMPLEXITY: Low   GOALS: Goals reviewed with patient? Yes  SHORT TERM GOALS: Target date: 04/06/2023   Patient will be independent with initial HEP.  Baseline:  Goal status: MET 03/25/23   LONG TERM GOALS: Target date: 05/04/2023   Patient will be independent with advanced/ongoing HEP to improve outcomes and carryover.  Baseline:  Goal status: IN PROGRESS 03/25/23  2.  Patient will  report 75% improvement in R shoulder pain to improve QOL.  Baseline:  Goal status: INITIAL  3.  Patient will report 24 on Quick Dash to demonstrate improved functional ability.  Baseline: 52.3 / 100 = 52.3 % Goal status: INITIAL  4.  Patient to improve R shoulder AROM to  [facility]  without pain provocation to allow patient to perform her normal chores/ADLs.  Baseline:  Goal status: INITIAL  5.  Patient will demonstrate improved functional UE strength as demonstrated by 5/5 MMT. Baseline:  Goal status: INITIAL  6 .  Patient will be able to ride her horse with 75% less pain in her R shoulder.   Baseline:  Goal status: INITIAL   7. Patient able to sleep without waking from pain Baseline:  Goal status: INITIAL   PLAN:  PT FREQUENCY: 2x/week  PT DURATION: 8 weeks  PLANNED INTERVENTIONS: Therapeutic exercises, Therapeutic activity, Neuromuscular re-education, Patient/Family education, Self Care, Joint mobilization, Dry Needling, Electrical stimulation, Spinal mobilization, Cryotherapy, Moist heat, Taping, Ionotophoresis 4mg /ml Dexamethasone, and Manual therapy  PLAN FOR NEXT SESSION: Continue progressing shoulder strengthening   Claude Manges, PT 03/30/23 11:53 AM  [facility] [address] [place], [place] [address] Phone # [phone] Fax [phone]

## 2023-04-01 ENCOUNTER — Encounter: Payer: 59 | Admitting: Physical Therapy

## 2023-04-03 ENCOUNTER — Inpatient Hospital Stay: Payer: 59 | Admitting: Hematology and Oncology

## 2023-04-03 ENCOUNTER — Telehealth: Payer: 59 | Admitting: Adult Health

## 2023-04-04 NOTE — Progress Notes (Unsigned)
[facility] Cancer Follow up:    Elenore Paddy, NP [address] [place] [address]   DIAGNOSIS: Cancer Staging  Malignant neoplasm of upper-outer quadrant of right breast in female, estrogen receptor positive (HCC) Staging form: Breast, AJCC 8th Edition - Clinical stage from 08/29/2020: Stage IIIA (cT2, cN2, cM0, G3, ER+, PR+, HER2-) - Signed by Loa Socks, NP on 09/07/2020 Stage prefix: Initial diagnosis Stage used in treatment planning: Yes National guidelines used in treatment planning: Yes Type of national guideline used in treatment planning: NCCN  I connected with Adrienne Mocha on 04/04/23 at  3:45 PM EDT by telephone and verified that I am speaking with the correct person using two identifiers.  I discussed the limitations, risks, security and privacy concerns of performing an evaluation and management service by telephone and the availability of in person appointments.  I also discussed with the patient that there may be a patient responsible charge related to this service. The patient expressed understanding and agreed to proceed.  Patient location:home Provider location: [facility] office  SUMMARY OF ONCOLOGIC HISTORY: Oncology History  Malignant neoplasm of upper-outer quadrant of right breast in female, estrogen receptor positive (HCC)  08/21/2020 Initial Diagnosis   right breast upper outer quadrant biopsy 08/21/2020 for a clinical mT2 N1, stage IIA/B invasive ductal carcinoma, grade 3, estrogen receptor positive, progesterone receptor weakly positive, HER-2 not amplified, with an MIB-1 of 40%   08/29/2020 Cancer Staging   Staging form: Breast, AJCC 8th Edition - Clinical stage from 08/29/2020: Stage IIIA (cT2, cN2, cM0, G3, ER+, PR+, HER2-) - Signed by Loa Socks, NP on 09/07/2020 Stage prefix: Initial diagnosis Stage used in treatment planning: Yes National guidelines used in treatment planning: Yes Type of national guideline used  in treatment planning: NCCN   09/18/2020 - 03/01/2021 Chemotherapy   AC X 4 foll by Taxol X 12    09/18/2020 Genetic Testing   Negative genetic testing:  No pathogenic variants detected on the [employer] CancerNext-Expanded + RNAinsight panel. The report date is 09/18/2020.   The CancerNext-Expanded + RNAinsight gene panel offered by [employer] and includes sequencing and rearrangement analysis for the following 77 genes: AIP, ALK, APC, ATM, AXIN2, BAP1, BARD1, BLM, BMPR1A, BRCA1, BRCA2, BRIP1, CDC73, CDH1, CDK4, CDKN1B, CDKN2A, CHEK2, CTNNA1, DICER1, FANCC, FH, FLCN, GALNT12, KIF1B, LZTR1, MAX, MEN1, MET, MLH1, MSH2, MSH3, MSH6, MUTYH, NBN, NF1, NF2, NTHL1, PALB2, PHOX2B, PMS2, POT1, PRKAR1A, PTCH1, PTEN, RAD51C, RAD51D, RB1, RECQL, RET, SDHA, SDHAF2, SDHB, SDHC, SDHD, SMAD4, SMARCA4, SMARCB1, SMARCE1, STK11, SUFU, TMEM127, TP53, TSC1, TSC2, VHL and XRCC2 (sequencing and deletion/duplication); EGFR, EGLN1, HOXB13, KIT, MITF, PDGFRA, POLD1 and POLE (sequencing only); EPCAM and GREM1 (deletion/duplication only). RNA data is routinely analyzed for use in variant interpretation for all genes.    Surgery   bilateral mastectomies with right axillary lymph nodes dissection on 04/25/2021 showing (A) on the left, no evidence of malignancy (B) on the right a residual ypT1c ypN0 invasive ductal carcinoma, grade 3, with a focally positive deep margin a total of 24 right axillary and 2 right intramammary lymph nodes were removed, all clear   06/12/2021 - 07/24/2021 Radiation Therapy   Adj XRT   06/2021 -  Anti-estrogen oral therapy   Letrozole beginning 06/2021; Verzenio added 11/09/2021     CURRENT THERAPY:  INTERVAL HISTORY: JAYLISSA FELTY 62 y.o. female returns for    Patient Active Problem List   Diagnosis Date Noted   Chronic right shoulder pain 03/04/2023  Cough 10/17/2022   Encounter for lipid screening for cardiovascular disease 10/17/2022   Diabetes mellitus screening 10/17/2022   Recurrent  cold sores 10/17/2022   Genetic testing 09/18/2020   History of augmentation mammoplasty 08/31/2020   Family history of breast cancer    Family history of pancreatic cancer    Family history of melanoma    Family history of ovarian cancer    Malignant neoplasm of upper-outer quadrant of right breast in female, estrogen receptor positive (HCC) 08/28/2020   Vaginal atrophy 11/09/2014   Menopause 01/05/2014    is allergic to peanut-containing drug products.  MEDICAL HISTORY: Past Medical History:  Diagnosis Date   Anemia    Depression 01/05/2014   Facial basal cell cancer    Family history of breast cancer    Family history of melanoma    Family history of ovarian cancer    Family history of pancreatic cancer    History of radiation therapy    right chest wall and subclavian 06/11/2021-07/26/2021  Dr Antony Blackbird   Menopause 01/05/2014   Port-A-Cath in place 10/04/2020   Vaginal atrophy 11/09/2014    SURGICAL HISTORY: Past Surgical History:  Procedure Laterality Date   BILATERAL TOTAL MASTECTOMY WITH AXILLARY LYMPH NODE DISSECTION Right 04/25/2021   Procedure: RIGHT TOTAL MASTECTOMY WITH RIGHT AXILLARY LYMPH NODE DISSECTION;  Surgeon: Almond Lint, MD;  [facility];  Service: General;  Laterality: Right;   BREAST ENHANCEMENT SURGERY     BREAST IMPLANT REMOVAL Bilateral 04/25/2021   Procedure: REMOVAL BREAST IMPLANTS;  Surgeon: Almond Lint, MD;  Location: [facility];  Service: General;  Laterality: Bilateral;   CARPAL TUNNEL RELEASE Right    PORT-A-CATH REMOVAL N/A 10/15/2021   Procedure: REMOVAL PORT-A-CATH;  Surgeon: Almond Lint, MD;  [facility];  Service: General;  Laterality: N/A;   PORTACATH PLACEMENT N/A 09/17/2020   Procedure: INSERTION PORT-A-CATH;  Surgeon: Almond Lint, MD;  Location: [facility];  Service: General;  Laterality: N/A;   refractive lensectomy Bilateral    SCAR REVISION Left 10/15/2021   Procedure: REVISION OF LEFT MASTECTOMY SCAR;   Surgeon: Almond Lint, MD;  [facility];  Service: General;  Laterality: Left;   TOTAL MASTECTOMY Left 04/25/2021   Procedure: LEFT TOTAL MASTECTOMY;  Surgeon: Almond Lint, MD;  [facility];  Service: General;  Laterality: Left;    SOCIAL HISTORY: Social History   Socioeconomic History   Marital status: Married    Spouse name: Not on file   Number of children: Not on file   Years of education: Not on file   Highest education level: Not on file  Occupational History   Not on file  Tobacco Use   Smoking status: Never   Smokeless tobacco: Never  Vaping Use   Vaping status: Never Used  Substance and Sexual Activity   Alcohol use: No   Drug use: No   Sexual activity: Not Currently    Birth control/protection: Post-menopausal  Other Topics Concern   Not on file  Social History Narrative   Married 34 years,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   Social Determinants of Health   Financial Resource Strain: Low Risk  (08/29/2020)   Overall Financial Resource Strain (CARDIA)    Difficulty of Paying Living Expenses: Not very hard  Food Insecurity: No Food Insecurity (07/22/2021)   Received from [facility], [facility]   Hunger Vital Sign    Worried About Running Out of Food in the Last Year: Never true  Ran Out of Food in the Last Year: Never true  Transportation Needs: No Transportation Needs (08/29/2020)   PRAPARE - Administrator, Civil Service (Medical): No    Lack of Transportation (Non-Medical): No  Physical Activity: Not on file  Stress: Not on file  Social Connections: Unknown (10/23/2021)   Received from [facility], [facility]   Social Network    Social Network: Not on file  Intimate Partner Violence: Unknown (09/26/2021)   Received from [facility], [facility]   HITS    Physically Hurt: Not on file    Insult or Talk Down To: Not on file    Threaten Physical Harm: Not on file    Scream or Curse: Not on file    FAMILY  HISTORY: Family History  Problem Relation Age of Onset   Breast cancer Mother 70       breast   Hypertension Father    Heart attack Father    Alzheimer's disease Father    Fibromyalgia Sister    Diabetes Sister    Heart attack Sister    Hypertension Sister    Hypertension Brother    Diabetes Brother    Pancreatic cancer Maternal Grandmother 55       Pancreatic   Melanoma Paternal Uncle        dx 73s   Melanoma Paternal Uncle        dx 13s   Ovarian cancer Paternal Aunt        dx 50s/60s    Review of Systems - Oncology    PHYSICAL EXAMINATION  Patient sounds well, she is in no apparent distress        ASSESSMENT and THERAPY PLAN:   No problem-specific Assessment & Plan notes found for this encounter.  Follow up instructions:    -Return to cancer center ***  -Mammogram due in *** -Follow up with surgery ***   The patient was provided an opportunity to ask questions and all were answered. The patient agreed with the plan and demonstrated an understanding of the instructions.   The patient was advised to call back or seek an in-person evaluation if the symptoms worsen or if the condition fails to improve as anticipated.   I provided *** minutes of {Blank single:19197::"face-to-face video visit time","non face-to-face telephone visit time"} during this encounter, and > 50% was spent counseling as documented under my assessment & plan.   Lillard Anes, NP 04/04/23 4:55 PM Medical Oncology and Hematology [facility] [address] [place], [place] [address] Tel. [phone]    Fax. [phone]

## 2023-04-06 ENCOUNTER — Inpatient Hospital Stay: Payer: 59 | Attending: Hematology and Oncology | Admitting: Adult Health

## 2023-04-06 DIAGNOSIS — C50411 Malignant neoplasm of upper-outer quadrant of right female breast: Secondary | ICD-10-CM | POA: Diagnosis not present

## 2023-04-06 DIAGNOSIS — Z170 Estrogen receptor positive status [ER+]: Secondary | ICD-10-CM | POA: Diagnosis not present

## 2023-04-06 MED ORDER — ANASTROZOLE 1 MG PO TABS: 1.0000 mg | ORAL_TABLET | Freq: Every day | ORAL | 3 refills | Status: DC

## 2023-04-06 NOTE — Assessment & Plan Note (Signed)
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%, PR 2%, HER-2 not amplified, with an MIB-1 of 40% T2N2 Stage 3A Neoadj chemo with AC-T completed 10/26/20 Bil mastectomies 04/25/21: Rt: 1.6 cm, grade 3, Deep margin pos, Perineural inv present, 0/24 Axln, 0/2 Intra mamm LN Neg  Adj XRT completed 07/24/21 ------------------------------------------------------------------------------------------------------------------------------------------------  Treatment Plan: Adj Anti estrogen therapy with letrozole started 07/10/2021 and abemaciclib started 11/09/2021 Letrozole toxicities: unable to tolerate due to arthralgias, changed to Anastrozole in 02/2023 and tolerating well.     Verzinio toxicities:  Diarrhea: Resolved Fatigue Mild leukopenia: Monitoring  She will continue the anastrozole daily.  Return to clinic in December as scheduled

## 2023-04-08 ENCOUNTER — Encounter: Payer: Self-pay | Admitting: Physical Therapy

## 2023-04-08 ENCOUNTER — Ambulatory Visit: Payer: 59 | Admitting: Physical Therapy

## 2023-04-08 DIAGNOSIS — M25511 Pain in right shoulder: Secondary | ICD-10-CM | POA: Diagnosis not present

## 2023-04-08 DIAGNOSIS — G8929 Other chronic pain: Secondary | ICD-10-CM

## 2023-04-08 DIAGNOSIS — R252 Cramp and spasm: Secondary | ICD-10-CM

## 2023-04-08 DIAGNOSIS — M6281 Muscle weakness (generalized): Secondary | ICD-10-CM

## 2023-04-08 DIAGNOSIS — M25611 Stiffness of right shoulder, not elsewhere classified: Secondary | ICD-10-CM

## 2023-04-08 NOTE — Therapy (Addendum)
OUTPATIENT PHYSICAL THERAPY SHOULDER TREATMENT   Patient Name: Melissa Mcgrath MRN: [id number] DOB:01-04-1961, 62 y.o., female Today's Date: 04/08/2023  END OF SESSION:  PT End of Session - 04/08/23 1709     Visit Number 7    Date for PT Re-Evaluation 05/04/23    Authorization Type Jaye Beagle    PT Start Time 1618    PT Stop Time 1656    PT Time Calculation (min) 38 min    Activity Tolerance Patient tolerated treatment well    Behavior During Therapy [facility] for tasks assessed/performed                 Past Medical History:  Diagnosis Date   Anemia    Depression 01/05/2014   Facial basal cell cancer    Family history of breast cancer    Family history of melanoma    Family history of ovarian cancer    Family history of pancreatic cancer    History of radiation therapy    right chest wall and subclavian 06/11/2021-07/26/2021  Dr Antony Blackbird   Menopause 01/05/2014   Port-A-Cath in place 10/04/2020   Vaginal atrophy 11/09/2014   Past Surgical History:  Procedure Laterality Date   BILATERAL TOTAL MASTECTOMY WITH AXILLARY LYMPH NODE DISSECTION Right 04/25/2021   Procedure: RIGHT TOTAL MASTECTOMY WITH RIGHT AXILLARY LYMPH NODE DISSECTION;  Surgeon: Almond Lint, MD;  Location: [facility] OR;  Service: General;  Laterality: Right;   BREAST ENHANCEMENT SURGERY     BREAST IMPLANT REMOVAL Bilateral 04/25/2021   Procedure: REMOVAL BREAST IMPLANTS;  Surgeon: Almond Lint, MD;  Location: [facility] OR;  Service: General;  Laterality: Bilateral;   CARPAL TUNNEL RELEASE Right    PORT-A-CATH REMOVAL N/A 10/15/2021   Procedure: REMOVAL PORT-A-CATH;  Surgeon: Almond Lint, MD;  Location: [facility] OR;  Service: General;  Laterality: N/A;   PORTACATH PLACEMENT N/A 09/17/2020   Procedure: INSERTION PORT-A-CATH;  Surgeon: Almond Lint, MD;  Location: [facility];  Service: General;  Laterality: N/A;   refractive lensectomy Bilateral    SCAR REVISION Left 10/15/2021   Procedure: REVISION OF  LEFT MASTECTOMY SCAR;  Surgeon: Almond Lint, MD;  Location: [facility] OR;  Service: General;  Laterality: Left;   TOTAL MASTECTOMY Left 04/25/2021   Procedure: LEFT TOTAL MASTECTOMY;  Surgeon: Almond Lint, MD;  Location: [facility] OR;  Service: General;  Laterality: Left;   Patient Active Problem List   Diagnosis Date Noted   Chronic right shoulder pain 03/04/2023   Cough 10/17/2022   Encounter for lipid screening for cardiovascular disease 10/17/2022   Diabetes mellitus screening 10/17/2022   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   Genetic testing 09/18/2020   History of augmentation mammoplasty 08/31/2020   Family history of breast cancer    Family history of pancreatic cancer    Family history of melanoma    Family history of ovarian cancer    Malignant neoplasm of upper-outer quadrant of right breast in female, estrogen receptor positive (HCC) 08/28/2020   Vaginal atrophy 11/09/2014   Menopause 01/05/2014    PCP: Elenore Paddy, NP   REFERRING PROVIDER: Marisa Cyphers, MD   REFERRING DIAG: [id number] (ICD-10-CM) - Chronic right shoulder pain  Evaluate and treat for right shoulder rotator cuff tendinopathy and AC joint arthritis.   THERAPY DIAG:  Chronic right shoulder pain  Stiffness of right shoulder, not elsewhere classified  Muscle weakness (generalized)  Cramp and spasm  Rationale for Evaluation and Treatment: Rehabilitation  ONSET DATE: March 2024  SUBJECTIVE:  SUBJECTIVE STATEMENT:  Over the weekend someone patted her on the back hard and it brought her to tears. Her shoulder was very sore for two days. Currently she is not having any pain.  Hand dominance: Right  PERTINENT HISTORY: B mastectomy; Rt Axillary node dissection and radiation   PAIN: 03/30/2023 Are you having pain? Yes: NPRS  scale: 5/10 Pain location: ant right shoulder to laterl and post spots Pain description: sharp and then aches Aggravating factors: riding, vacuuming, sleeping, reaching OH to water flowers Relieving factors: ice, Meloxicam, rest  PRECAUTIONS: Other: R breast CA and subsequent surgeries; high risk Rt arm lymphedema; no repetitive exercise with Rt arm; no heat/ice to Rt arm  RED FLAGS: None   WEIGHT BEARING RESTRICTIONS: No  FALLS:  Has patient fallen in last 6 months? 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   PATIENT SURVEYS:  Quick Dash 52.3 / 100 = 52.3 %  COGNITION: Overall cognitive status: Within functional limits for tasks assessed     SENSATION: WFL  POSTURE: Mild R winging of scapula, mild fwd head  UPPER EXTREMITY ROM: Functional IR - thumb to T7 L, L1 R   A/P ROM Right eval Left eval  Shoulder flexion 145/165 170/180  Shoulder extension    Shoulder abduction 102/135 148/180  Shoulder adduction    Shoulder internal rotation 63/75 53/80  Shoulder external rotation 58/72   Elbow flexion    Elbow extension    (Blank rows = not tested)  UPPER EXTREMITY MMT:  MMT Right eval Left eval  Shoulder flexion 5 5  Shoulder extension 4- 4+  Shoulder abduction 4+ 5  Shoulder adduction    Shoulder internal rotation 4+ 5  Shoulder external rotation 5 5  Middle trapezius    Lower trapezius    Elbow flexion 5   Elbow extension    (Blank rows = not tested)  SHOULDER SPECIAL TESTS: Impingement tests: Neer impingement test: negative and Hawkins/Kennedy  impingement test: positive   Rotator cuff assessment: Drop arm test: negative, Empty can test: negative, Full can test: negative, and Gerber lift off test: negative    PALPATION:  Left levator, UT, lats, deltoids, pectorals   TODAY'S TREATMENT:                                                                                                                                         DATE:  04/08/23 (no UBE due to lymphedema) Doorway stretches 2 x 20 sec Posterior Shoulder Capsule 2 x 30 sec Standing Chest stretch with  long band 5 sec holds x 8 4D ball rolls x 20 each direction (right) 3 way Scap stabilization 2x10 Blue loop (both) Lat Pull Downs 25lbs 2 x 10 Shoulder Rows at cable column 2x10 15# Triceps Extension 15# 2 x 10 Standing shoulder flexion x 20 with 1 lb Standing shoulder scaption x 20 with 1 lb Fwd bent standing shoulder ext, rows and horizontal abduction with 3 lbs (2 lbs with horizontal abd) 2x10 each right    03/30/23 (no UBE due to lymphedema) Doorway stretches x 10 hold 10 sec each Posterior Shoulder Capsule 2 x 30 sec 3 way Scap stabilization 2x10 Blue loop (both) 4D ball rolls x 20 each direction (right) Lat Pull Downs 25lbs 2 x 10 Shoulder Rows at cable column 2x10 15# Shoulder Extension at cable column 2x10  Standing shoulder flexion with Iso shoulder abduction with blue loop 2 x 10 Standing shoulder flexion x 20 with 1 lb Standing shoulder scaption x 20 with 1 lb Standing bilateral shoulder ER with red band  Standing bilateral shoulder horizontal abduction x 20 with red band Fwd bent standing shoulder ext, rows and horizontal abduction with 3 lbs (2 lbs with horizontal abd) 2x10 each right  Side lying ER with 2 lbs x 10 right (increased pain and discomfort) Supine Serratus punch x 20 with 2 lbs right   03/25/23 (no UBE due to lymphedema) 3 way Scap stabilization 2x10 Blue loop (both) 4D ball rolls x 20 each direction (right) Lat Pull Downs 25lbs Doorway  stretches x 10 hold 10 sec each Shoulder Rows at cable column 2x10 15# Standing shoulder flexion x 20 with 1 lb Standing shoulder scaption x 20 with 1 lb Standing bilateral shoulder ER with red band  Standing bilateral shoulder horizontal abduction x 20 with red band Fwd bent shoulder ext, rows and horizontal abduction with 3 lbs (2 lbs with horizontal abd) 2x10 each right  Side lying ER with 2 lbs x 20 right Supine Serratus punch x 20 with 2 lbs right     DATE:  03/09/23 See pt ed and HEP   PATIENT EDUCATION: Education details: PT eval findings, anticipated POC, initial HEP, and role of DN  Person educated: Patient Education method: Explanation, Demonstration, and Handouts Education comprehension: verbalized understanding and returned demonstration  HOME EXERCISE PROGRAM: Access Code: [facility] URL: [email or web address] Date: 03/30/2023 Prepared by: Claude Manges  Exercises - Supine Shoulder Flexion Extension AAROM with Dowel  - 1 x daily - 7 x weekly - 1-2 sets - 10 reps - 5 seconds hold - Supine Shoulder External Rotation with Dowel  - 1 x daily - 7 x weekly - 1-2 sets - 10 reps - 5 sseconds hold - Shoulder Scaption AAROM with Dowel  - 1 x daily - 7 x weekly - 1-2 sets - 10 reps - 3-5 seconds hold - Standing Shoulder Extension with Dowel  - 1 x daily - 7 x weekly - 3 sets - 10 reps - Standing Shoulder External Rotation Stretch in Doorway (Mirrored)  - 2 x daily - 7 x weekly - 1 sets - 3 reps - 15-30 sec hold - Prone Shoulder Extension - Single Arm  - 2 x daily - 7 x weekly - 2 sets - 10 reps - Prone Shoulder Row  - 2 x daily - 7 x weekly - 2 sets - 10 reps - Prone Single Arm Shoulder Horizontal Abduction with Scapular Retraction and Palm Down  - 2 x daily - 7 x weekly -  2 sets - 10 reps - Sidelying Shoulder External Rotation  - 2 x daily - 7 x weekly - 2 sets - 10 reps - Single Arm Serratus Punches in Supine with Dumbbell  - 2 x daily - 7 x weekly - 2 sets - 10  reps - Standing Shoulder Flexion to 90 Degrees with Dumbbells  - 2 x daily - 7 x weekly - 2 sets - 10 reps - Standing Shoulder Scaption  - 2 x daily - 7 x weekly - 2 sets - 10 reps - Shoulder External Rotation and Scapular Retraction with Resistance  - 1 x daily - 7 x weekly - 2 sets - 10 reps - Standing Shoulder Horizontal Abduction with Resistance  - 1 x daily - 7 x weekly - 2 sets - 10 reps - Standing Shoulder Row with Anchored Resistance  - 1 x daily - 7 x weekly - 2 sets - 10 reps  ASSESSMENT:  CLINICAL IMPRESSION: Today's treatment session focused on postural strengthening and shoulder mobility. Patient verbalized feeling 60-65% better since starting therapy. She is now able to reach across her body with her Rt arm without pain. She still experiences pain when she is carrying heavy objects around the farm. She experienced a mild increase in pain when completing shoulder flexion but pain was tolerable. Patient required moderate verbal cues for form correction while performing exercises. Patient will benefit from skilled PT to address the below impairments and improve overall function.    OBJECTIVE IMPAIRMENTS: decreased activity tolerance, decreased ROM, decreased strength, increased muscle spasms, impaired flexibility, impaired UE functional use, postural dysfunction, and pain.   ACTIVITY LIMITATIONS: carrying, lifting, sleeping, bathing, and reach over head  PARTICIPATION LIMITATIONS: yard work  PERSONAL FACTORS: Time since onset of injury/illness/exacerbation and 1 comorbidity: previous breast surgery  are also affecting patient's functional outcome.   REHAB POTENTIAL: Excellent  CLINICAL DECISION MAKING: Stable/uncomplicated  EVALUATION COMPLEXITY: Low   GOALS: Goals reviewed with patient? Yes  SHORT TERM GOALS: Target date: 04/06/2023   Patient will be independent with initial HEP.  Baseline:  Goal status: MET 03/25/23   LONG TERM GOALS: Target date: 05/04/2023    Patient will be independent with advanced/ongoing HEP to improve outcomes and carryover.  Baseline:  Goal status: IN PROGRESS 03/25/23  2.  Patient will report 75% improvement in R shoulder pain to improve QOL.  Baseline:  Goal status: INITIAL  3.  Patient will report 7 on Quick Dash to demonstrate improved functional ability.  Baseline: 52.3 / 100 = 52.3 % Goal status: INITIAL  4.  Patient to improve R shoulder AROM to  [facility]  without pain provocation to allow patient to perform her normal chores/ADLs.  Baseline:  Goal status: INITIAL  5.  Patient will demonstrate improved functional UE strength as demonstrated by 5/5 MMT. Baseline:  Goal status: INITIAL  6 .  Patient will be able to ride her horse with 75% less pain in her R shoulder.   Baseline:  Goal status: INITIAL   7. Patient able to sleep without waking from pain Baseline:  Goal status: INITIAL   PLAN:  PT FREQUENCY: 2x/week  PT DURATION: 8 weeks  PLANNED INTERVENTIONS: Therapeutic exercises, Therapeutic activity, Neuromuscular re-education, Patient/Family education, Self Care, Joint mobilization, Dry Needling, Electrical stimulation, Spinal mobilization, Cryotherapy, Moist heat, Taping, Ionotophoresis 4mg /ml Dexamethasone, and Manual therapy  PLAN FOR NEXT SESSION: Overhead stability; farmer's carries; assess goals    Claude Manges, PT 04/08/23 5:10 PM  [facility]  [address] [place], [place] [address] Phone # [phone] Fax [phone]

## 2023-04-13 ENCOUNTER — Encounter: Payer: 59 | Admitting: Physical Therapy

## 2023-04-13 ENCOUNTER — Ambulatory Visit: Payer: Self-pay | Admitting: Physical Therapy

## 2023-04-13 ENCOUNTER — Encounter: Payer: Self-pay | Admitting: Physical Therapy

## 2023-04-13 DIAGNOSIS — R252 Cramp and spasm: Secondary | ICD-10-CM

## 2023-04-13 DIAGNOSIS — G8929 Other chronic pain: Secondary | ICD-10-CM

## 2023-04-13 DIAGNOSIS — M25611 Stiffness of right shoulder, not elsewhere classified: Secondary | ICD-10-CM

## 2023-04-13 DIAGNOSIS — M25511 Pain in right shoulder: Secondary | ICD-10-CM | POA: Diagnosis not present

## 2023-04-13 DIAGNOSIS — M6281 Muscle weakness (generalized): Secondary | ICD-10-CM

## 2023-04-13 NOTE — Therapy (Signed)
OUTPATIENT PHYSICAL THERAPY SHOULDER TREATMENT   Patient Name: Melissa Mcgrath MRN: [id number] DOB:[birth year], 62 y.o., female Today's Date: 04/13/2023  END OF SESSION:  PT End of Session - 04/13/23 1319     Visit Number 8    Date for PT Re-Evaluation 05/04/23    Authorization Type Jaye Beagle    Authorization Time Period 03/30/2023-07/28/2023    Authorization - Number of Visits 16    PT Start Time 1103    PT Stop Time 1146    PT Time Calculation (min) 43 min    Activity Tolerance Patient tolerated treatment well    Behavior During Therapy [facility] for tasks assessed/performed                  Past Medical History:  Diagnosis Date   Anemia    Depression 01/05/2014   Facial basal cell cancer    Family history of breast cancer    Family history of melanoma    Family history of ovarian cancer    Family history of pancreatic cancer    History of radiation therapy    right chest wall and subclavian 06/11/2021-07/26/2021  Dr Antony Blackbird   Menopause 01/05/2014   Port-A-Cath in place 10/04/2020   Vaginal atrophy 11/09/2014   Past Surgical History:  Procedure Laterality Date   BILATERAL TOTAL MASTECTOMY WITH AXILLARY LYMPH NODE DISSECTION Right 04/25/2021   Procedure: RIGHT TOTAL MASTECTOMY WITH RIGHT AXILLARY LYMPH NODE DISSECTION;  Surgeon: Almond Lint, MD;  Location: [facility] OR;  Service: General;  Laterality: Right;   BREAST ENHANCEMENT SURGERY     BREAST IMPLANT REMOVAL Bilateral 04/25/2021   Procedure: REMOVAL BREAST IMPLANTS;  Surgeon: Almond Lint, MD;  Location: [facility] OR;  Service: General;  Laterality: Bilateral;   CARPAL TUNNEL RELEASE Right    PORT-A-CATH REMOVAL N/A 10/15/2021   Procedure: REMOVAL PORT-A-CATH;  Surgeon: Almond Lint, MD;  Location: [facility] OR;  Service: General;  Laterality: N/A;   PORTACATH PLACEMENT N/A 09/17/2020   Procedure: INSERTION PORT-A-CATH;  Surgeon: Almond Lint, MD;  Location: [facility];  Service: General;  Laterality: N/A;    refractive lensectomy Bilateral    SCAR REVISION Left 10/15/2021   Procedure: REVISION OF LEFT MASTECTOMY SCAR;  Surgeon: Almond Lint, MD;  Location: [facility] OR;  Service: General;  Laterality: Left;   TOTAL MASTECTOMY Left 04/25/2021   Procedure: LEFT TOTAL MASTECTOMY;  Surgeon: Almond Lint, MD;  Location: [facility] OR;  Service: General;  Laterality: Left;   Patient Active Problem List   Diagnosis Date Noted   Chronic right shoulder pain 03/04/2023   Cough 10/17/2022   Encounter for lipid screening for cardiovascular disease 10/17/2022   Diabetes mellitus screening 10/17/2022   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   Genetic testing 09/18/2020   History of augmentation mammoplasty 08/31/2020   Family history of breast cancer    Family history of pancreatic cancer    Family history of melanoma    Family history of ovarian cancer    Malignant neoplasm of upper-outer quadrant of right breast in female, estrogen receptor positive (HCC) 08/28/2020   Vaginal atrophy 11/09/2014   Menopause 01/05/2014    PCP: Elenore Paddy, NP   REFERRING PROVIDER: Marisa Cyphers, MD   REFERRING DIAG: [phone] (ICD-10-CM) - Chronic right shoulder pain  Evaluate and treat for right shoulder rotator cuff tendinopathy and AC joint arthritis.   THERAPY DIAG:  Chronic right shoulder pain  Stiffness of right shoulder, not elsewhere classified  Muscle weakness (generalized)  Cramp  and spasm  Rationale for Evaluation and Treatment: Rehabilitation  ONSET DATE: March 2024  SUBJECTIVE:                                                                                                                                                                                      SUBJECTIVE STATEMENT:  Patient reports she is doing good today. She is not currently having any pain. She was able to blow leaves with the blower around her back without pain.  Hand dominance: Right  PERTINENT HISTORY: B mastectomy; Rt Axillary  node dissection and radiation   PAIN: 03/30/2023 Are you having pain? Yes: NPRS scale: 5/10 Pain location: ant right shoulder to laterl and post spots Pain description: sharp and then aches Aggravating factors: riding, vacuuming, sleeping, reaching OH to water flowers Relieving factors: ice, Meloxicam, rest  PRECAUTIONS: Other: R breast CA and subsequent surgeries; high risk Rt arm lymphedema; no repetitive exercise with Rt arm; no heat/ice to Rt arm  RED FLAGS: None   WEIGHT BEARING RESTRICTIONS: No  FALLS:  Has patient fallen in last 6 months? 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   PATIENT SURVEYS:  Quick Dash 52.3 / 100 = 52.3 % 04/13/2023 Quick Dash 13.6%  COGNITION: Overall cognitive status: Within functional limits for tasks assessed     SENSATION: WFL  POSTURE: Mild R winging of scapula, mild fwd head  UPPER EXTREMITY ROM: Functional IR - thumb to T7 L, L1 R   A/P ROM Right eval Left eval  Shoulder flexion 145/165 170/180  Shoulder extension    Shoulder abduction 102/135 148/180  Shoulder adduction    Shoulder internal rotation 63/75 53/80  Shoulder external rotation 58/72   Elbow flexion    Elbow extension    (Blank rows = not tested)  UPPER EXTREMITY MMT:  MMT Right eval Left eval  Shoulder flexion 5 5  Shoulder extension 4- 4+  Shoulder abduction 4+ 5  Shoulder adduction    Shoulder internal rotation 4+ 5  Shoulder external rotation 5 5  Middle trapezius    Lower trapezius    Elbow flexion 5   Elbow extension    (Blank rows = not  tested)  SHOULDER SPECIAL TESTS: Impingement tests: Neer impingement test: negative and Hawkins/Kennedy impingement test: positive  Rotator cuff assessment: Drop arm test: negative, Empty can test: negative, Full can test: negative, and Gerber lift off test: negative    PALPATION:  Left levator, UT, lats, deltoids, pectorals   TODAY'S TREATMENT:                                                                                                                                         DATE:  04/13/23 (no UBE due to lymphedema) Unilateral farmers carries 25# x 4 3 way Scap stabilization 2x10 Blue loop (both) Lat Pull Downs 25lbs 2 x 10 Shoulder Rows at cable column 2x10 15# Triceps Extension 15# 2 x 10 Single arm DB press 3# 2 x 10 Shoulder flexion abduction against green loop x 5 Standing shoulder flexion x 20 with 1 lb Standing shoulder scaption x 20 with 1 lb Fwd bent shoulder extension, rows and horizontal abduction with 3 lbs  2x10 each  Shoulder flexion with iso ER against yellow TB (hooked on arm rail) 2 x 10 Updated Goals & Re administered Quick Dash (13.6%)   04/08/23 (no UBE due to lymphedema) Doorway stretches 2 x 20 sec Posterior Shoulder Capsule 2 x 30 sec Standing Chest stretch with long band 5 sec holds x 8 4D ball rolls x 20 each direction (right) 3 way Scap stabilization 2x10 Blue loop (both) Lat Pull Downs 25lbs 2 x 10 Shoulder Rows at cable column 2x10 15# Triceps Extension 15# 2 x 10 Standing shoulder flexion x 20 with 1 lb Standing shoulder scaption x 20 with 1 lb Fwd bent standing shoulder ext, rows and horizontal abduction with 3 lbs (2 lbs with horizontal abd) 2x10 each right    03/30/23 (no UBE due to lymphedema) Doorway stretches x 10 hold 10 sec each Posterior Shoulder Capsule 2 x 30 sec 3 way Scap stabilization 2x10 Blue loop (both) 4D ball rolls x 20 each direction (right) Lat Pull Downs 25lbs 2 x 10 Shoulder Rows at cable column 2x10  15# Shoulder Extension at cable column 2x10  Standing shoulder flexion with Iso shoulder abduction with blue loop 2 x 10 Standing shoulder flexion x 20 with 1 lb Standing shoulder scaption x 20 with 1 lb Standing bilateral shoulder ER with red band  Standing bilateral shoulder horizontal abduction x 20 with red band Fwd bent standing shoulder ext, rows and horizontal abduction with 3 lbs (2 lbs with horizontal abd) 2x10 each right  Side lying ER with 2 lbs x 10 right (increased pain and discomfort) Supine Serratus punch x 20 with 2 lbs right    PATIENT EDUCATION: Education details: PT eval findings, anticipated POC, initial HEP, and role of DN  Person educated: Patient Education method: Explanation, Demonstration, and Handouts Education comprehension: verbalized understanding and returned demonstration  HOME EXERCISE PROGRAM: Access Code: [facility] URL: [email or web address] Date: 03/30/2023 Prepared by: Claude Manges  Exercises - Supine Shoulder Flexion Extension AAROM with Dowel  -  1 x daily - 7 x weekly - 1-2 sets - 10 reps - 5 seconds hold - Supine Shoulder External Rotation with Dowel  - 1 x daily - 7 x weekly - 1-2 sets - 10 reps - 5 sseconds hold - Shoulder Scaption AAROM with Dowel  - 1 x daily - 7 x weekly - 1-2 sets - 10 reps - 3-5 seconds hold - Standing Shoulder Extension with Dowel  - 1 x daily - 7 x weekly - 3 sets - 10 reps - Standing Shoulder External Rotation Stretch in Doorway (Mirrored)  - 2 x daily - 7 x weekly - 1 sets - 3 reps - 15-30 sec hold - Prone Shoulder Extension - Single Arm  - 2 x daily - 7 x weekly - 2 sets - 10 reps - Prone Shoulder Row  - 2 x daily - 7 x weekly - 2 sets - 10 reps - Prone Single Arm Shoulder Horizontal Abduction with Scapular Retraction and Palm Down  - 2 x daily - 7 x weekly - 2 sets - 10 reps - Sidelying Shoulder External Rotation  - 2 x daily - 7 x weekly - 2 sets - 10 reps - Single Arm Serratus Punches in Supine with  Dumbbell  - 2 x daily - 7 x weekly - 2 sets - 10 reps - Standing Shoulder Flexion to 90 Degrees with Dumbbells  - 2 x daily - 7 x weekly - 2 sets - 10 reps - Standing Shoulder Scaption  - 2 x daily - 7 x weekly - 2 sets - 10 reps - Shoulder External Rotation and Scapular Retraction with Resistance  - 1 x daily - 7 x weekly - 2 sets - 10 reps - Standing Shoulder Horizontal Abduction with Resistance  - 1 x daily - 7 x weekly - 2 sets - 10 reps - Standing Shoulder Row with Anchored Resistance  - 1 x daily - 7 x weekly - 2 sets - 10 reps  ASSESSMENT:  CLINICAL IMPRESSION: Today's treatment session focused on overhead shoulder stability. Patient tolerated over head exercises well and did not verbalize any increased pain. Incorporated unilateral farmer's carries to simulate patient carrying a five gallon bucket. Patient was able to do exercise without increased shoulder pain. Updated patient's goals and she has met three of her long term goals. [place] continues to show improvement with physical therapy and verbalizing increased function. Overhead activities are her most challenging activity. Discussed possibly decreasing to once a week and patient verbalized agreement.  Patient will benefit from skilled PT to address the below impairments and improve overall function.    OBJECTIVE IMPAIRMENTS: decreased activity tolerance, decreased ROM, decreased strength, increased muscle spasms, impaired flexibility, impaired UE functional use, postural dysfunction, and pain.   ACTIVITY LIMITATIONS: carrying, lifting, sleeping, bathing, and reach over head  PARTICIPATION LIMITATIONS: yard work  PERSONAL FACTORS: Time since onset of injury/illness/exacerbation and 1 comorbidity: previous breast surgery  are also affecting patient's functional outcome.   REHAB POTENTIAL: Excellent  CLINICAL DECISION MAKING: Stable/uncomplicated  EVALUATION COMPLEXITY: Low   GOALS: Goals reviewed with patient? Yes  SHORT TERM  GOALS: Target date: 04/06/2023   Patient will be independent with initial HEP.  Baseline:  Goal status: MET 03/25/23   LONG TERM GOALS: Target date: 05/04/2023   Patient will be independent with advanced/ongoing HEP to improve outcomes and carryover.  Baseline:  Goal status: IN PROGRESS 03/25/23  2.  Patient will report 75% improvement in R shoulder pain  to improve QOL.  Baseline:  Goal status: IN PROGRESS 70% 04/13/2023  3.  Patient will report 20 on Quick Dash to demonstrate improved functional ability.  Baseline: 52.3 / 100 = 52.3 % Goal status: MET 13.6%  4.  Patient to improve R shoulder AROM to  [facility]  without pain provocation to allow patient to perform her normal chores/ADLs.  Baseline:  Goal status: MET 04/13/2023  5.  Patient will demonstrate improved functional UE strength as demonstrated by 5/5 MMT. Baseline:  Goal status: INITIAL  6 .  Patient will be able to ride her horse with 75% less pain in her R shoulder.   Baseline:  Goal status: INITIAL   7. Patient able to sleep without waking from pain Baseline:  Goal status: MET 04/13/2023   PLAN:  PT FREQUENCY: 2x/week  PT DURATION: 8 weeks  PLANNED INTERVENTIONS: Therapeutic exercises, Therapeutic activity, Neuromuscular re-education, Patient/Family education, Self Care, Joint mobilization, Dry Needling, Electrical stimulation, Spinal mobilization, Cryotherapy, Moist heat, Taping, Ionotophoresis 4mg /ml Dexamethasone, and Manual therapy  PLAN FOR NEXT SESSION: continue overhead stability   Claude Manges, PT 04/13/23 1:21 PM   [facility] [address] [place], [place] [address] Phone # [phone] Fax [phone]

## 2023-04-15 ENCOUNTER — Encounter: Payer: Self-pay | Admitting: Physical Therapy

## 2023-04-15 ENCOUNTER — Ambulatory Visit: Payer: 59 | Admitting: Physical Therapy

## 2023-04-15 DIAGNOSIS — M25611 Stiffness of right shoulder, not elsewhere classified: Secondary | ICD-10-CM

## 2023-04-15 DIAGNOSIS — G8929 Other chronic pain: Secondary | ICD-10-CM

## 2023-04-15 DIAGNOSIS — M25511 Pain in right shoulder: Secondary | ICD-10-CM | POA: Diagnosis not present

## 2023-04-15 DIAGNOSIS — R252 Cramp and spasm: Secondary | ICD-10-CM

## 2023-04-15 DIAGNOSIS — M6281 Muscle weakness (generalized): Secondary | ICD-10-CM

## 2023-04-15 NOTE — Therapy (Signed)
OUTPATIENT PHYSICAL THERAPY SHOULDER TREATMENT   Patient Name: Melissa Mcgrath MRN: [id number] DOB:18-Oct-1960, 62 y.o., female Today's Date: 04/15/2023  END OF SESSION:  PT End of Session - 04/15/23 1456     Visit Number 9    Date for PT Re-Evaluation 05/04/23    Authorization Type Jaye Beagle    Authorization Time Period 03/30/2023-07/28/2023    Authorization - Visit Number 4    Authorization - Number of Visits 16    PT Start Time 1405    PT Stop Time 1445    PT Time Calculation (min) 40 min    Activity Tolerance Patient tolerated treatment well    Behavior During Therapy [facility] for tasks assessed/performed                   Past Medical History:  Diagnosis Date   Anemia    Depression 01/05/2014   Facial basal cell cancer    Family history of breast cancer    Family history of melanoma    Family history of ovarian cancer    Family history of pancreatic cancer    History of radiation therapy    right chest wall and subclavian 06/11/2021-07/26/2021  Dr Antony Blackbird   Menopause 01/05/2014   Port-A-Cath in place 10/04/2020   Vaginal atrophy 11/09/2014   Past Surgical History:  Procedure Laterality Date   BILATERAL TOTAL MASTECTOMY WITH AXILLARY LYMPH NODE DISSECTION Right 04/25/2021   Procedure: RIGHT TOTAL MASTECTOMY WITH RIGHT AXILLARY LYMPH NODE DISSECTION;  Surgeon: Almond Lint, MD;  Location: [facility] OR;  Service: General;  Laterality: Right;   BREAST ENHANCEMENT SURGERY     BREAST IMPLANT REMOVAL Bilateral 04/25/2021   Procedure: REMOVAL BREAST IMPLANTS;  Surgeon: Almond Lint, MD;  Location: [facility] OR;  Service: General;  Laterality: Bilateral;   CARPAL TUNNEL RELEASE Right    PORT-A-CATH REMOVAL N/A 10/15/2021   Procedure: REMOVAL PORT-A-CATH;  Surgeon: Almond Lint, MD;  Location: [facility] OR;  Service: General;  Laterality: N/A;   PORTACATH PLACEMENT N/A 09/17/2020   Procedure: INSERTION PORT-A-CATH;  Surgeon: Almond Lint, MD;  Location: [facility];   Service: General;  Laterality: N/A;   refractive lensectomy Bilateral    SCAR REVISION Left 10/15/2021   Procedure: REVISION OF LEFT MASTECTOMY SCAR;  Surgeon: Almond Lint, MD;  Location: [facility] OR;  Service: General;  Laterality: Left;   TOTAL MASTECTOMY Left 04/25/2021   Procedure: LEFT TOTAL MASTECTOMY;  Surgeon: Almond Lint, MD;  Location: [facility] OR;  Service: General;  Laterality: Left;   Patient Active Problem List   Diagnosis Date Noted   Chronic right shoulder pain 03/04/2023   Cough 10/17/2022   Encounter for lipid screening for cardiovascular disease 10/17/2022   Diabetes mellitus screening 10/17/2022   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   Genetic testing 09/18/2020   History of augmentation mammoplasty 08/31/2020   Family history of breast cancer    Family history of pancreatic cancer    Family history of melanoma    Family history of ovarian cancer    Malignant neoplasm of upper-outer quadrant of right breast in female, estrogen receptor positive (HCC) 08/28/2020   Vaginal atrophy 11/09/2014   Menopause 01/05/2014    PCP: Elenore Paddy, NP   REFERRING PROVIDER: Marisa Cyphers, MD   REFERRING DIAG: [phone] (ICD-10-CM) - Chronic right shoulder pain  Evaluate and treat for right shoulder rotator cuff tendinopathy and AC joint arthritis.   THERAPY DIAG:  Chronic right shoulder pain  Stiffness of right shoulder,  not elsewhere classified  Muscle weakness (generalized)  Cramp and spasm  Rationale for Evaluation and Treatment: Rehabilitation  ONSET DATE: March 2024  SUBJECTIVE:                                                                                                                                                                                      SUBJECTIVE STATEMENT:   Patient reports she is not feeling the best today. She has been having GI issues all day. She experienced some mild swelling in her Rt arm she notices last night. She has not worn her  compression sleeve Hand dominance: Right  PERTINENT HISTORY: B mastectomy; Rt Axillary node dissection and radiation   PAIN: 03/30/2023 Are you having pain? Yes: NPRS scale: 5/10 Pain location: ant right shoulder to laterl and post spots Pain description: sharp and then aches Aggravating factors: riding, vacuuming, sleeping, reaching OH to water flowers Relieving factors: ice, Meloxicam, rest  PRECAUTIONS: Other: R breast CA and subsequent surgeries; high risk Rt arm lymphedema; no repetitive exercise with Rt arm; no heat/ice to Rt arm  RED FLAGS: None   WEIGHT BEARING RESTRICTIONS: No  FALLS:  Has patient fallen in last 6 months? 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   PATIENT SURVEYS:  Quick Dash 52.3 / 100 = 52.3 % 04/13/2023 Quick Dash 13.6%  COGNITION: Overall cognitive status: Within functional limits for tasks assessed     SENSATION: WFL  POSTURE: Mild R winging of scapula, mild fwd head  UPPER EXTREMITY ROM: Functional IR - thumb to T7 L, L1 R   A/P ROM Right eval Left eval  Shoulder flexion 145/165 170/180  Shoulder extension    Shoulder abduction 102/135 148/180  Shoulder adduction    Shoulder internal rotation 63/75 53/80  Shoulder external rotation 58/72   Elbow flexion    Elbow extension    (Blank rows = not tested)  UPPER EXTREMITY MMT:  MMT Right eval Left eval  Shoulder flexion 5 5  Shoulder extension 4- 4+  Shoulder abduction 4+ 5  Shoulder adduction    Shoulder internal rotation 4+ 5  Shoulder external rotation 5 5  Middle  trapezius    Lower trapezius    Elbow flexion 5   Elbow extension    (Blank rows =  not tested)  SHOULDER SPECIAL TESTS: Impingement tests: Neer impingement test: negative and Hawkins/Kennedy impingement test: positive   Rotator cuff assessment: Drop arm test: negative, Empty can test: negative, Full can test: negative, and Gerber lift off test: negative    PALPATION:  Left levator, UT, lats, deltoids, pectorals   TODAY'S TREATMENT:                                                                                                                                         DATE:  04/15/23 (no UBE due to lymphedema) 3 way Scap stabilization 2x10 Blue loop (both) Wall Push up 2 x 10 Lat Pull Downs 25lbs 2 x 10 Shoulder Rows at cable column 2x10 25# Triceps Extension 15# 2 x 10 Shoulder flexion abduction against blue loop x 10 Fwd bent shoulder extension, rows and horizontal abduction with 3 lbs  2x10 each  Standing shoulder flexion x 20 with 1 lb Standing shoulder scaption x 20 with 1 lb Around the world's 1# DB x 20 Shoulder flexion with iso ER against yellow TB (hooked on arm rail) 2 x 10 Standing shoulder ER yellow TB 2 x 10 Posterior shoulder capsule stretch 2 x 30 sec Doorway Pec stretch  2 x 30sec Standing "L" stretch x 8 5 sec holds   04/13/23 (no UBE due to lymphedema) Unilateral farmers carries 25# x 4 3 way Scap stabilization 2x10 Blue loop (both) Lat Pull Downs 25lbs 2 x 10 Shoulder Rows at cable column 2x10 15# Triceps Extension 15# 2 x 10 Single arm DB press 3# 2 x 10 Shoulder flexion abduction against green loop x 5 Standing shoulder flexion x 20 with 1 lb Standing shoulder scaption x 20 with 1 lb Fwd bent shoulder extension, rows and horizontal abduction with 3 lbs  2x10 each  Shoulder flexion with iso ER against yellow TB (hooked on arm rail) 2 x 10 Updated Goals & Re administered Quick Dash (13.6%)   04/08/23 (no UBE due to lymphedema) Doorway stretches 2 x  20 sec Posterior Shoulder Capsule 2 x 30 sec Standing Chest stretch with long band 5 sec holds x 8 4D ball rolls x 20 each direction (right) 3 way Scap stabilization 2x10 Blue loop (both) Lat Pull Downs 25lbs 2 x 10 Shoulder Rows at cable column 2x10 15# Triceps Extension 15# 2 x 10 Standing shoulder flexion x 20 with 1 lb Standing shoulder scaption x 20 with 1 lb Fwd bent standing shoulder ext, rows and horizontal abduction with 3 lbs (2 lbs with horizontal abd) 2x10 each right      PATIENT EDUCATION: Education details: PT eval findings, anticipated POC, initial HEP, and role of DN  Person educated: Patient Education method: Explanation, Demonstration, and Handouts Education comprehension: verbalized understanding and returned demonstration  HOME EXERCISE PROGRAM: Access Code: [facility] URL: [email or web address] Date: 03/30/2023 Prepared by: Claude Manges  Exercises - Supine Shoulder Flexion  Extension AAROM with Dowel  - 1 x daily - 7 x weekly - 1-2 sets - 10 reps - 5 seconds hold - Supine Shoulder External Rotation with Dowel  - 1 x daily - 7 x weekly - 1-2 sets - 10 reps - 5 sseconds hold - Shoulder Scaption AAROM with Dowel  - 1 x daily - 7 x weekly - 1-2 sets - 10 reps - 3-5 seconds hold - Standing Shoulder Extension with Dowel  - 1 x daily - 7 x weekly - 3 sets - 10 reps - Standing Shoulder External Rotation Stretch in Doorway (Mirrored)  - 2 x daily - 7 x weekly - 1 sets - 3 reps - 15-30 sec hold - Prone Shoulder Extension - Single Arm  - 2 x daily - 7 x weekly - 2 sets - 10 reps - Prone Shoulder Row  - 2 x daily - 7 x weekly - 2 sets - 10 reps - Prone Single Arm Shoulder Horizontal Abduction with Scapular Retraction and Palm Down  - 2 x daily - 7 x weekly - 2 sets - 10 reps - Sidelying Shoulder External Rotation  - 2 x daily - 7 x weekly - 2 sets - 10 reps - Single Arm Serratus Punches in Supine with Dumbbell  - 2 x daily - 7 x weekly - 2 sets - 10 reps -  Standing Shoulder Flexion to 90 Degrees with Dumbbells  - 2 x daily - 7 x weekly - 2 sets - 10 reps - Standing Shoulder Scaption  - 2 x daily - 7 x weekly - 2 sets - 10 reps - Shoulder External Rotation and Scapular Retraction with Resistance  - 1 x daily - 7 x weekly - 2 sets - 10 reps - Standing Shoulder Horizontal Abduction with Resistance  - 1 x daily - 7 x weekly - 2 sets - 10 reps - Standing Shoulder Row with Anchored Resistance  - 1 x daily - 7 x weekly - 2 sets - 10 reps  ASSESSMENT:  CLINICAL IMPRESSION: Today's treatment session focused on shoulder strengthening and stability. Patient was sore after last treatment session with more overhead activities. 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 Patient's has mild swelling in her Rt forearm that she noticed last night. Educated patient to wear her compression sleeve to help with the swelling and lymph drainage. Patient tolerated treatment session well and did not experience any increased pain or discomfort. After this week patient will decrease to once a week. She verbalized wanting to do dry needling with another therapist. Patient will benefit from skilled PT to address the below impairments and improve overall function.    OBJECTIVE IMPAIRMENTS: decreased activity tolerance, decreased ROM, decreased strength, increased muscle spasms, impaired flexibility, impaired UE functional use, postural dysfunction, and pain.   ACTIVITY LIMITATIONS: carrying, lifting, sleeping, bathing, and reach over head  PARTICIPATION LIMITATIONS: yard work  PERSONAL FACTORS: Time since onset of injury/illness/exacerbation and 1 comorbidity: previous breast surgery  are also affecting patient's functional outcome.   REHAB POTENTIAL: Excellent  CLINICAL DECISION MAKING: Stable/uncomplicated  EVALUATION COMPLEXITY: Low   GOALS: Goals reviewed with patient? Yes  SHORT TERM GOALS: Target date: 04/06/2023   Patient will be independent  with initial HEP.  Baseline:  Goal status: MET 03/25/23   LONG TERM GOALS: Target date: 05/04/2023   Patient will be independent with advanced/ongoing HEP to improve outcomes and carryover.  Baseline:  Goal status: IN  PROGRESS 03/25/23  2.  Patient will report 75% improvement in R shoulder pain to improve QOL.  Baseline:  Goal status: IN PROGRESS 70% 04/13/2023  3.  Patient will report 95 on Quick Dash to demonstrate improved functional ability.  Baseline: 52.3 / 100 = 52.3 % Goal status: MET 13.6%  4.  Patient to improve R shoulder AROM to  [facility]  without pain provocation to allow patient to perform her normal chores/ADLs.  Baseline:  Goal status: MET 04/13/2023  5.  Patient will demonstrate improved functional UE strength as demonstrated by 5/5 MMT. Baseline:  Goal status: INITIAL  6 .  Patient will be able to ride her horse with 75% less pain in her R shoulder.   Baseline:  Goal status: INITIAL   7. Patient able to sleep without waking from pain Baseline:  Goal status: MET 04/13/2023   PLAN:  PT FREQUENCY: 2x/week  PT DURATION: 8 weeks  PLANNED INTERVENTIONS: Therapeutic exercises, Therapeutic activity, Neuromuscular re-education, Patient/Family education, Self Care, Joint mobilization, Dry Needling, Electrical stimulation, Spinal mobilization, Cryotherapy, Moist heat, Taping, Ionotophoresis 4mg /ml Dexamethasone, and Manual therapy  PLAN FOR NEXT SESSION: assess swelling and response to treatment session   Claude Manges, PT 04/15/23 3:01 PM   [facility] [address] [place], [place] [address] Phone # [phone] Fax [phone]

## 2023-04-16 ENCOUNTER — Other Ambulatory Visit: Payer: Self-pay

## 2023-04-22 ENCOUNTER — Ambulatory Visit: Payer: 59 | Admitting: Physical Therapy

## 2023-04-22 ENCOUNTER — Encounter: Payer: Self-pay | Admitting: Physical Therapy

## 2023-04-22 ENCOUNTER — Ambulatory Visit: Payer: 59 | Admitting: Sports Medicine

## 2023-04-22 VITALS — BP 102/64 | Ht 68.0 in | Wt 135.0 lb

## 2023-04-22 DIAGNOSIS — M6281 Muscle weakness (generalized): Secondary | ICD-10-CM

## 2023-04-22 DIAGNOSIS — R252 Cramp and spasm: Secondary | ICD-10-CM

## 2023-04-22 DIAGNOSIS — M25511 Pain in right shoulder: Secondary | ICD-10-CM | POA: Diagnosis not present

## 2023-04-22 DIAGNOSIS — G8929 Other chronic pain: Secondary | ICD-10-CM

## 2023-04-22 DIAGNOSIS — M25611 Stiffness of right shoulder, not elsewhere classified: Secondary | ICD-10-CM

## 2023-04-22 MED ORDER — NITROGLYCERIN 0.2 MG/HR TD PT24: MEDICATED_PATCH | TRANSDERMAL | 0 refills | Status: DC

## 2023-04-22 NOTE — Progress Notes (Unsigned)
   PCP: Elenore Paddy, NP  SUBJECTIVE:   HPI:  Melissa Mcgrath is a 62 y.o. female here for 6 week follow-up of right shoulder pain. She took Meloxicam daily for the first month after our visit, but is now taking it prn. She has also been diligently doing PT. She notes about 80% improvement in her pain. She is curious if she should continue taking the Meloxicam.   ROS:     See HPI  PERTINENT  PMH / PSH FH / / SH:  Past Medical, Surgical, Social, and Family History Reviewed & Updated in the EMR.  Pertinent findings include:  Hx of breast cancer - s/p bilateral mastectomy and lymph node resection in right axilla Hx of radiation therapy to right chest wall (12/22 - 2/23)  Allergies  Allergen Reactions   Peanut-Containing Drug Products Nausea And Vomiting   OBJECTIVE:  BP 102/64   Ht 5\' 8"  (1.727 m)   Wt 135 lb (61.2 kg)   BMI 20.53 kg/m   PHYSICAL EXAM:  GEN: Alert and Oriented, NAD, comfortable in exam room RESP: Unlabored respirations, symmetric chest rise PSY: normal mood, congruent affect   MSK EXAM: Right Shoulder No swelling, ecchymoses.  No gross deformity. No TTP. FROM. Strength 5/5 with empty can and resisted internal/external rotation - no pain with these. Negative Hawkins, Neers. Negative Yergasons and Speeds No pain with Cross-arm testing. NV intact distally.    ASSESSMENT & PLAN:  1. Chronic right shoulder pain Improving chronic supraspinatous and biceps tendinopathy as well as AC joint arthritis. Advised continuation of PT and incorporation of home exercises. Recommended discontinuation or sparing use of Meloxicam until her current Rx runs out. Will start nitroglycerin patch protocol, no contraindications. Rx sent and reviewed with the Melissa Mcgrath common side effects and appropriate use. Will follow-up in 6 weeks.   Glean Salen, MD PGY-4, Sports Medicine Fellow [facility]  Addendum:  I was the preceptor for this visit and available for  immediate consultation.  Norton Blizzard MD Marrianne Mood

## 2023-04-22 NOTE — Patient Instructions (Signed)

## 2023-04-22 NOTE — Therapy (Signed)
OUTPATIENT PHYSICAL THERAPY SHOULDER TREATMENT   Patient Name: Melissa Mcgrath MRN: [id number] DOB:05/24/1961, 62 y.o., female Today's Date: 04/22/2023  END OF SESSION:  PT End of Session - 04/22/23 1713     Visit Number 10    Date for PT Re-Evaluation 05/04/23    Authorization Type Jaye Beagle    Authorization Time Period 03/30/2023-07/28/2023    Authorization - Visit Number 9    Authorization - Number of Visits 16    PT Start Time 1615    PT Stop Time 1655    PT Time Calculation (min) 40 min    Activity Tolerance Patient tolerated treatment well    Behavior During Therapy [facility] for tasks assessed/performed                    Past Medical History:  Diagnosis Date   Anemia    Depression 01/05/2014   Facial basal cell cancer    Family history of breast cancer    Family history of melanoma    Family history of ovarian cancer    Family history of pancreatic cancer    History of radiation therapy    right chest wall and subclavian 06/11/2021-07/26/2021  Dr Antony Blackbird   Menopause 01/05/2014   Port-A-Cath in place 10/04/2020   Vaginal atrophy 11/09/2014   Past Surgical History:  Procedure Laterality Date   BILATERAL TOTAL MASTECTOMY WITH AXILLARY LYMPH NODE DISSECTION Right 04/25/2021   Procedure: RIGHT TOTAL MASTECTOMY WITH RIGHT AXILLARY LYMPH NODE DISSECTION;  Surgeon: Almond Lint, MD;  Location: [facility] OR;  Service: General;  Laterality: Right;   BREAST ENHANCEMENT SURGERY     BREAST IMPLANT REMOVAL Bilateral 04/25/2021   Procedure: REMOVAL BREAST IMPLANTS;  Surgeon: Almond Lint, MD;  Location: [facility] OR;  Service: General;  Laterality: Bilateral;   CARPAL TUNNEL RELEASE Right    PORT-A-CATH REMOVAL N/A 10/15/2021   Procedure: REMOVAL PORT-A-CATH;  Surgeon: Almond Lint, MD;  Location: [facility];  Service: General;  Laterality: N/A;   PORTACATH PLACEMENT N/A 09/17/2020   Procedure: INSERTION PORT-A-CATH;  Surgeon: Almond Lint, MD;  Location: [facility];   Service: General;  Laterality: N/A;   refractive lensectomy Bilateral    SCAR REVISION Left 10/15/2021   Procedure: REVISION OF LEFT MASTECTOMY SCAR;  Surgeon: Almond Lint, MD;  Location: [facility];  Service: General;  Laterality: Left;   TOTAL MASTECTOMY Left 04/25/2021   Procedure: LEFT TOTAL MASTECTOMY;  Surgeon: Almond Lint, MD;  Location: [facility];  Service: General;  Laterality: Left;   Patient Active Problem List   Diagnosis Date Noted   Chronic right shoulder pain 03/04/2023   Cough 10/17/2022   Encounter for lipid screening for cardiovascular disease 10/17/2022   Diabetes mellitus screening 10/17/2022   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   Genetic testing 09/18/2020   History of augmentation mammoplasty 08/31/2020   Family history of breast cancer    Family history of pancreatic cancer    Family history of melanoma    Family history of ovarian cancer    Malignant neoplasm of upper-outer quadrant of right breast in female, estrogen receptor positive (HCC) 08/28/2020   Vaginal atrophy 11/09/2014   Menopause 01/05/2014    PCP: Elenore Paddy, NP   REFERRING PROVIDER: Marisa Cyphers, MD   REFERRING DIAG: [phone] (ICD-10-CM) - Chronic right shoulder pain  Evaluate and treat for right shoulder rotator cuff tendinopathy and AC joint arthritis.   THERAPY DIAG:  Chronic right shoulder pain  Stiffness of right  shoulder, not elsewhere classified  Muscle weakness (generalized)  Cramp and spasm  Rationale for Evaluation and Treatment: Rehabilitation  ONSET DATE: March 2024  SUBJECTIVE:                                                                                                                                                                                      SUBJECTIVE STATEMENT:   Patient reports she is doing good today. She went to the doctor today for a follow up appointment and everything is looking good. Hand dominance: Right  PERTINENT HISTORY: B  mastectomy; Rt Axillary node dissection and radiation   PAIN: 03/30/2023 Are you having pain? Yes: NPRS scale: 5/10 Pain location: ant right shoulder to laterl and post spots Pain description: sharp and then aches Aggravating factors: riding, vacuuming, sleeping, reaching OH to water flowers Relieving factors: ice, Meloxicam, rest  PRECAUTIONS: Other: R breast CA and subsequent surgeries; high risk Rt arm lymphedema; no repetitive exercise with Rt arm; no heat/ice to Rt arm  RED FLAGS: None   WEIGHT BEARING RESTRICTIONS: No  FALLS:  Has patient fallen in last 6 months? 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   PATIENT SURVEYS:  Quick Dash 52.3 / 100 = 52.3 % 04/13/2023 Quick Dash 13.6%  COGNITION: Overall cognitive status: Within functional limits for tasks assessed     SENSATION: WFL  POSTURE: Mild R winging of scapula, mild fwd head  UPPER EXTREMITY ROM: Functional IR - thumb to T7 L, L1 R   A/P ROM Right eval Left eval  Shoulder flexion 145/165 170/180  Shoulder extension    Shoulder abduction 102/135 148/180  Shoulder adduction    Shoulder internal rotation 63/75 53/80  Shoulder external rotation 58/72   Elbow flexion    Elbow extension    (Blank rows = not tested)  UPPER EXTREMITY MMT:  MMT Right eval Left eval  Shoulder flexion 5 5  Shoulder extension 4- 4+  Shoulder abduction 4+ 5  Shoulder adduction    Shoulder internal rotation 4+ 5  Shoulder external rotation 5 5  Middle trapezius    Lower trapezius    Elbow flexion 5   Elbow  extension    (Blank rows = not tested)  SHOULDER SPECIAL TESTS: Impingement tests: Neer impingement test: negative and  Hawkins/Kennedy impingement test: positive   Rotator cuff assessment: Drop arm test: negative, Empty can test: negative, Full can test: negative, and Gerber lift off test: negative    PALPATION:  Left levator, UT, lats, deltoids, pectorals   TODAY'S TREATMENT:                                                                                                                                         DATE:  04/22/23 (no UBE due to lymphedema) 3 way Scap stabilization 2x10 Blue loop (both) Wall Clocks 2# plyoball x 8 Wall Push up 2 x 10 Lat Pull Downs 25lbs 2 x 10 Shoulder Rows at cable column 2x10 15# Triceps Extension 15# 2 x 10 Walking planks at barre x 4 Shoulder flexion abduction against blue loop x 10 Fwd bent shoulder extension, rows and horizontal abduction with 2 lbs  2x10 each  Standing shoulder flexion x 20 with 1 lb Standing shoulder scaption x 20 with 1 lb Around the world's 1# DB x 10 Standing shoulder ER red TB 2 x 10 Shoulder flexion with iso ER against yellow TB (hooked on arm rail) 2 x 10 Posterior shoulder capsule stretch 2 x 30 sec Standing biceps stretch (hands behind back) x 5 Standing pecs in corner 8 x 5 sec hold  04/15/23 (no UBE due to lymphedema) 3 way Scap stabilization 2x10 Blue loop (both) Wall Push up 2 x 10 Lat Pull Downs 25lbs 2 x 10 Shoulder Rows at cable column 2x10 25# Triceps Extension 15# 2 x 10 Shoulder flexion abduction against blue loop x 10 Fwd bent shoulder extension, rows and horizontal abduction with 3 lbs  2x10 each  Standing shoulder flexion x 20 with 1 lb Standing shoulder scaption x 20 with 1 lb Around the world's 1# DB x 20 Shoulder flexion with iso ER against yellow TB (hooked on arm rail) 2 x 10 Standing shoulder ER yellow TB 2 x 10 Posterior shoulder capsule stretch 2 x 30 sec Doorway Pec stretch  2 x  30sec Standing "L" stretch x 8 5 sec holds   04/13/23 (no UBE due to lymphedema) Unilateral farmers carries 25# x 4 3 way Scap stabilization 2x10 Blue loop (both) Lat Pull Downs 25lbs 2 x 10 Shoulder Rows at cable column 2x10 15# Triceps Extension 15# 2 x 10 Single arm DB press 3# 2 x 10 Shoulder flexion abduction against green loop x 5 Standing shoulder flexion x 20 with 1 lb Standing shoulder scaption x 20 with 1 lb Fwd bent shoulder extension, rows and horizontal abduction with 3 lbs  2x10 each  Shoulder flexion with iso ER against yellow TB (hooked on arm rail) 2 x 10 Updated Goals & Re administered Quick Dash (13.6%)    PATIENT EDUCATION: Education details: PT eval findings, anticipated POC, initial HEP, and role of DN  Person educated: Patient Education method: Explanation, Demonstration, and Handouts  Education comprehension: verbalized understanding and returned demonstration  HOME EXERCISE PROGRAM: Access Code: [facility] URL: [email or web address] Date: 03/30/2023 Prepared by: Claude Manges  Exercises - Supine Shoulder Flexion Extension AAROM with Dowel  - 1 x daily - 7 x weekly - 1-2 sets - 10 reps - 5 seconds hold - Supine Shoulder External Rotation with Dowel  - 1 x daily - 7 x weekly - 1-2 sets - 10 reps - 5 sseconds hold - Shoulder Scaption AAROM with Dowel  - 1 x daily - 7 x weekly - 1-2 sets - 10 reps - 3-5 seconds hold - Standing Shoulder Extension with Dowel  - 1 x daily - 7 x weekly - 3 sets - 10 reps - Standing Shoulder External Rotation Stretch in Doorway (Mirrored)  - 2 x daily - 7 x weekly - 1 sets - 3 reps - 15-30 sec hold - Prone Shoulder Extension - Single Arm  - 2 x daily - 7 x weekly - 2 sets - 10 reps - Prone Shoulder Row  - 2 x daily - 7 x weekly - 2 sets - 10 reps - Prone Single Arm Shoulder Horizontal Abduction with Scapular Retraction and Palm Down  - 2 x daily - 7 x weekly - 2 sets - 10 reps - Sidelying Shoulder External Rotation   - 2 x daily - 7 x weekly - 2 sets - 10 reps - Single Arm Serratus Punches in Supine with Dumbbell  - 2 x daily - 7 x weekly - 2 sets - 10 reps - Standing Shoulder Flexion to 90 Degrees with Dumbbells  - 2 x daily - 7 x weekly - 2 sets - 10 reps - Standing Shoulder Scaption  - 2 x daily - 7 x weekly - 2 sets - 10 reps - Shoulder External Rotation and Scapular Retraction with Resistance  - 1 x daily - 7 x weekly - 2 sets - 10 reps - Standing Shoulder Horizontal Abduction with Resistance  - 1 x daily - 7 x weekly - 2 sets - 10 reps - Standing Shoulder Row with Anchored Resistance  - 1 x daily - 7 x weekly - 2 sets - 10 reps  ASSESSMENT:  CLINICAL IMPRESSION: Today's treatment session focused on shoulder strengthening and stability. Patient's swelling in her forearm has decreased since last treatment session. Patient wore her compression sleeve for a few days, but it started irritating her under her arm. Patient tolerated treatment session well and did not experience any increased pain. Required verbal cues for correct form and posture while performing overhead exercises. Incorporated more dynamic shoulder stability exercises. Patient will benefit from skilled PT to address the below impairments and improve overall function.    OBJECTIVE IMPAIRMENTS: decreased activity tolerance, decreased ROM, decreased strength, increased muscle spasms, impaired flexibility, impaired UE functional use, postural dysfunction, and pain.   ACTIVITY LIMITATIONS: carrying, lifting, sleeping, bathing, and reach over head  PARTICIPATION LIMITATIONS: yard work  PERSONAL FACTORS: Time since onset of injury/illness/exacerbation and 1 comorbidity: previous breast surgery  are also affecting patient's functional outcome.   REHAB POTENTIAL: Excellent  CLINICAL DECISION MAKING: Stable/uncomplicated  EVALUATION COMPLEXITY: Low   GOALS: Goals reviewed with patient? Yes  SHORT TERM GOALS: Target date: 04/06/2023    Patient will be independent with initial HEP.  Baseline:  Goal status: MET 03/25/23   LONG TERM GOALS: Target date: 05/04/2023   Patient will be independent with advanced/ongoing HEP to improve outcomes and carryover.  Baseline:  Goal status: IN  PROGRESS 03/25/23  2.  Patient will report 75% improvement in R shoulder pain to improve QOL.  Baseline:  Goal status: IN PROGRESS 70% 04/13/2023  3.  Patient will report 48 on Quick Dash to demonstrate improved functional ability.  Baseline: 52.3 / 100 = 52.3 % Goal status: MET 13.6%  4.  Patient to improve R shoulder AROM to  [facility]  without pain provocation to allow patient to perform her normal chores/ADLs.  Baseline:  Goal status: MET 04/13/2023  5.  Patient will demonstrate improved functional UE strength as demonstrated by 5/5 MMT. Baseline:  Goal status: INITIAL  6 .  Patient will be able to ride her horse with 75% less pain in her R shoulder.   Baseline:  Goal status: INITIAL   7. Patient able to sleep without waking from pain Baseline:  Goal status: MET 04/13/2023   PLAN:  PT FREQUENCY: 2x/week  PT DURATION: 8 weeks  PLANNED INTERVENTIONS: Therapeutic exercises, Therapeutic activity, Neuromuscular re-education, Patient/Family education, Self Care, Joint mobilization, Dry Needling, Electrical stimulation, Spinal mobilization, Cryotherapy, Moist heat, Taping, Ionotophoresis 4mg /ml Dexamethasone, and Manual therapy  PLAN FOR NEXT SESSION: continue shoulder stability & strengthening   Claude Manges, PT 04/22/23 5:15 PM  [facility] [address], [address] [place], [place] [address] Phone # [phone] Fax [phone]

## 2023-04-29 ENCOUNTER — Ambulatory Visit: Payer: 59 | Attending: Sports Medicine

## 2023-04-29 DIAGNOSIS — M6281 Muscle weakness (generalized): Secondary | ICD-10-CM | POA: Diagnosis present

## 2023-04-29 DIAGNOSIS — G8929 Other chronic pain: Secondary | ICD-10-CM | POA: Insufficient documentation

## 2023-04-29 DIAGNOSIS — R293 Abnormal posture: Secondary | ICD-10-CM | POA: Diagnosis present

## 2023-04-29 DIAGNOSIS — R252 Cramp and spasm: Secondary | ICD-10-CM | POA: Insufficient documentation

## 2023-04-29 DIAGNOSIS — M25612 Stiffness of left shoulder, not elsewhere classified: Secondary | ICD-10-CM | POA: Diagnosis present

## 2023-04-29 DIAGNOSIS — M25511 Pain in right shoulder: Secondary | ICD-10-CM | POA: Insufficient documentation

## 2023-04-29 DIAGNOSIS — M25611 Stiffness of right shoulder, not elsewhere classified: Secondary | ICD-10-CM | POA: Diagnosis present

## 2023-04-29 NOTE — Therapy (Signed)
OUTPATIENT PHYSICAL THERAPY SHOULDER TREATMENT   Patient Name: Melissa Mcgrath MRN: [id number] DOB:06/23/1961, 62 y.o., female Today's Date: 04/29/2023  END OF SESSION:  PT End of Session - 04/29/23 1359     Visit Number 11    Date for PT Re-Evaluation 06/23/23    Authorization Type Jaye Beagle    Authorization - Visit Number 11    Authorization - Number of Visits 16    Progress Note Due on Visit 16    PT Start Time 1400    PT Stop Time 1425    PT Time Calculation (min) 25 min    Activity Tolerance Patient tolerated treatment well    Behavior During Therapy [facility] for tasks assessed/performed                    Past Medical History:  Diagnosis Date   Anemia    Depression 01/05/2014   Facial basal cell cancer    Family history of breast cancer    Family history of melanoma    Family history of ovarian cancer    Family history of pancreatic cancer    History of radiation therapy    right chest wall and subclavian 06/11/2021-07/26/2021  Dr Antony Blackbird   Menopause 01/05/2014   Port-A-Cath in place 10/04/2020   Vaginal atrophy 11/09/2014   Past Surgical History:  Procedure Laterality Date   BILATERAL TOTAL MASTECTOMY WITH AXILLARY LYMPH NODE DISSECTION Right 04/25/2021   Procedure: RIGHT TOTAL MASTECTOMY WITH RIGHT AXILLARY LYMPH NODE DISSECTION;  Surgeon: Almond Lint, MD;  Location: [facility] OR;  Service: General;  Laterality: Right;   BREAST ENHANCEMENT SURGERY     BREAST IMPLANT REMOVAL Bilateral 04/25/2021   Procedure: REMOVAL BREAST IMPLANTS;  Surgeon: Almond Lint, MD;  Location: [facility] OR;  Service: General;  Laterality: Bilateral;   CARPAL TUNNEL RELEASE Right    PORT-A-CATH REMOVAL N/A 10/15/2021   Procedure: REMOVAL PORT-A-CATH;  Surgeon: Almond Lint, MD;  Location: [facility] OR;  Service: General;  Laterality: N/A;   PORTACATH PLACEMENT N/A 09/17/2020   Procedure: INSERTION PORT-A-CATH;  Surgeon: Almond Lint, MD;  Location: [facility];  Service:  General;  Laterality: N/A;   refractive lensectomy Bilateral    SCAR REVISION Left 10/15/2021   Procedure: REVISION OF LEFT MASTECTOMY SCAR;  Surgeon: Almond Lint, MD;  Location: [facility];  Service: General;  Laterality: Left;   TOTAL MASTECTOMY Left 04/25/2021   Procedure: LEFT TOTAL MASTECTOMY;  Surgeon: Almond Lint, MD;  Location: [facility];  Service: General;  Laterality: Left;   Patient Active Problem List   Diagnosis Date Noted   Chronic right shoulder pain 03/04/2023   Cough 10/17/2022   Encounter for lipid screening for cardiovascular disease 10/17/2022   Diabetes mellitus screening 10/17/2022   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   Genetic testing 09/18/2020   History of augmentation mammoplasty 08/31/2020   Family history of breast cancer    Family history of pancreatic cancer    Family history of melanoma    Family history of ovarian cancer    Malignant neoplasm of upper-outer quadrant of right breast in female, estrogen receptor positive (HCC) 08/28/2020   Vaginal atrophy 11/09/2014   Menopause 01/05/2014    PCP: Elenore Paddy, NP   REFERRING PROVIDER: Marisa Cyphers, MD   REFERRING DIAG: [phone] (ICD-10-CM) - Chronic right shoulder pain  Evaluate and treat for right shoulder rotator cuff tendinopathy and AC joint arthritis.   THERAPY DIAG:  Chronic right shoulder pain - Plan:  PT plan of care cert/re-cert  Stiffness of left shoulder, not elsewhere classified - Plan: PT plan of care cert/re-cert  Stiffness of right shoulder, not elsewhere classified - Plan: PT plan of care cert/re-cert  Muscle weakness (generalized) - Plan: PT plan of care cert/re-cert  Cramp and spasm - Plan: PT plan of care cert/re-cert  Abnormal posture - Plan: PT plan of care cert/re-cert  Rationale for Evaluation and Treatment: Rehabilitation  ONSET DATE: March 2024  SUBJECTIVE:                                                                                                                                                                                       SUBJECTIVE STATEMENT:   Patient reports she is doing good.  Still fairly restricted in right shoulder and chest wall.  Ok to proceed with DN but had to drop husband at a doctors appt an requests just the DN today if possible.   Hand dominance: Right  PERTINENT HISTORY: B mastectomy; Rt Axillary node dissection and radiation   PAIN: 03/30/2023 Are you having pain? Yes: NPRS scale: 5/10 Pain location: ant right shoulder to laterl and post spots Pain description: sharp and then aches Aggravating factors: riding, vacuuming, sleeping, reaching OH to water flowers Relieving factors: ice, Meloxicam, rest  PRECAUTIONS: Other: R breast CA and subsequent surgeries; high risk Rt arm lymphedema; no repetitive exercise with Rt arm; no heat/ice to Rt arm  RED FLAGS: None   WEIGHT BEARING RESTRICTIONS: No  FALLS:  Has patient fallen in last 6 months? 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   PATIENT SURVEYS:  Quick Dash 52.3 / 100 = 52.3 % 04/13/2023 Quick Dash 13.6% (goal met)  COGNITION: Overall cognitive status: Within functional limits for tasks assessed     SENSATION: WFL  POSTURE: Mild R winging of scapula, mild fwd head  UPPER EXTREMITY ROM: Functional IR - thumb to T7 L, L1 R   A/P ROM Right eval Left eval  Shoulder flexion 145/165 170/180  Shoulder extension    Shoulder abduction 102/135 148/180  Shoulder adduction    Shoulder internal rotation 63/75 53/80  Shoulder external rotation  58/72   Elbow flexion    Elbow extension    (Blank rows = not tested)  04/29/23: all are  WFL , continued restriction at end ranges on right   UPPER EXTREMITY MMT:  MMT Right eval Left eval  Shoulder flexion 5 5  Shoulder extension 4- 4+  Shoulder abduction 4+ 5  Shoulder adduction    Shoulder internal rotation 4+ 5  Shoulder external rotation 5 5  Middle trapezius    Lower trapezius    Elbow flexion 5   Elbow extension    (Blank rows = not tested)    04/29/23: Generally 4 to 5-/5 throughout, able to do basic ADL's and IADL's  SHOULDER SPECIAL TESTS: Impingement tests: Neer impingement test: negative and Hawkins/Kennedy impingement test: positive   Rotator cuff assessment: Drop arm test: negative, Empty can test: negative, Full can test: negative, and Gerber lift off test: negative    PALPATION:  Left levator, UT, lats, deltoids, pectorals   TODAY'S TREATMENT:                                                                                                                                         DATE:  04/29/23 Trigger Point Dry-Needling  Treatment instructions: Expect mild to moderate muscle soreness. S/S of pneumothorax if dry needled over a lung field, and to seek immediate medical attention should they occur. Patient verbalized understanding of these instructions and education. Patient Consent Given: Yes Education handout provided: Yes Muscles treated: right latissimus, right incisions using pincer grip on all available tissue Electrical stimulation performed: No Parameters: N/A Treatment response/outcome: Skilled palpation used to identify taut bands, adhesions and trigger points.  Once identified, dry needling techniques used to treat these areas.  Twitch response ellicited in latissimus along with palpable elongation of muscle.  Adhesion needling tolerated without pain.  Following treatment, patient denies any discomfort and mild soreness in right lat area.     04/22/23 (no UBE due to lymphedema) 3 way Scap stabilization 2x10 Blue loop (both) Wall Clocks 2# plyoball x 8 Wall Push up 2 x 10 Lat Pull Downs 25lbs 2 x 10 Shoulder Rows at cable column 2x10 15# Triceps Extension 15# 2 x 10 Walking planks at barre x 4 Shoulder flexion abduction against blue loop x 10 Fwd bent shoulder extension, rows and horizontal abduction with 2 lbs  2x10 each  Standing shoulder flexion x 20 with 1 lb Standing shoulder scaption x 20 with 1 lb Around the world's 1# DB x 10 Standing shoulder ER red TB 2 x 10 Shoulder flexion with iso ER against yellow TB (hooked on arm rail) 2 x 10 Posterior shoulder capsule stretch 2 x 30 sec Standing biceps stretch (hands behind back) x 5 Standing pecs in corner 8 x 5 sec hold  04/15/23 (no UBE due to lymphedema) 3 way Scap stabilization 2x10 Blue loop (both) Wall Push up 2 x 10 Lat Pull Downs 25lbs 2 x 10 Shoulder Rows at cable column 2x10 25# Triceps Extension 15# 2 x 10 Shoulder  flexion abduction against blue loop x 10 Fwd bent shoulder extension, rows and horizontal abduction with 3 lbs  2x10 each  Standing shoulder flexion x 20 with 1 lb Standing shoulder scaption x 20 with 1 lb Around the world's 1# DB x 20 Shoulder flexion with iso ER against yellow TB (hooked on arm rail) 2 x 10 Standing shoulder ER yellow TB 2 x 10 Posterior shoulder capsule stretch 2 x 30 sec Doorway Pec stretch  2 x 30sec Standing "L" stretch x 8 5 sec holds   PATIENT EDUCATION: Education details: PT eval findings, anticipated POC, initial HEP, and role of DN  Person educated: Patient Education method: Explanation, Demonstration, and Handouts Education comprehension: verbalized understanding and returned demonstration  HOME EXERCISE PROGRAM: Access Code: [facility] URL: [email or web address] Date: 03/30/2023 Prepared by: Claude Manges  Exercises - Supine Shoulder Flexion Extension AAROM with Dowel  - 1 x daily - 7 x  weekly - 1-2 sets - 10 reps - 5 seconds hold - Supine Shoulder External Rotation with Dowel  - 1 x daily - 7 x weekly - 1-2 sets - 10 reps - 5 sseconds hold - Shoulder Scaption AAROM with Dowel  - 1 x daily - 7 x weekly - 1-2 sets - 10 reps - 3-5 seconds hold - Standing Shoulder Extension with Dowel  - 1 x daily - 7 x weekly - 3 sets - 10 reps - Standing Shoulder External Rotation Stretch in Doorway (Mirrored)  - 2 x daily - 7 x weekly - 1 sets - 3 reps - 15-30 sec hold - Prone Shoulder Extension - Single Arm  - 2 x daily - 7 x weekly - 2 sets - 10 reps - Prone Shoulder Row  - 2 x daily - 7 x weekly - 2 sets - 10 reps - Prone Single Arm Shoulder Horizontal Abduction with Scapular Retraction and Palm Down  - 2 x daily - 7 x weekly - 2 sets - 10 reps - Sidelying Shoulder External Rotation  - 2 x daily - 7 x weekly - 2 sets - 10 reps - Single Arm Serratus Punches in Supine with Dumbbell  - 2 x daily - 7 x weekly - 2 sets - 10 reps - Standing Shoulder Flexion to 90 Degrees with Dumbbells  - 2 x daily - 7 x weekly - 2 sets - 10 reps - Standing Shoulder Scaption  - 2 x daily - 7 x weekly - 2 sets - 10 reps - Shoulder External Rotation and Scapular Retraction with Resistance  - 1 x daily - 7 x weekly - 2 sets - 10 reps - Standing Shoulder Horizontal Abduction with Resistance  - 1 x daily - 7 x weekly - 2 sets - 10 reps - Standing Shoulder Row with Anchored Resistance  - 1 x daily - 7 x weekly - 2 sets - 10 reps  ASSESSMENT:  CLINICAL IMPRESSION: Today's treatment session focused on dry needling of right latissimus and areas of scarring and adhesions.   She tolerated this without pain or discomfort.  At end of treatment, she reported mild soreness in the right latissimus.  We would like to try several sessions of the dry needling to assess response and progress with adhesions. We will recertify for another 8 weeks at 1 time per week to attempt dry needling and assess response.   Patient will benefit from  skilled PT to address the below impairments and improve overall function.  OBJECTIVE IMPAIRMENTS: decreased activity tolerance, decreased  ROM, decreased strength, increased muscle spasms, impaired flexibility, impaired UE functional use, postural dysfunction, and pain.   ACTIVITY LIMITATIONS: carrying, lifting, sleeping, bathing, and reach over head  PARTICIPATION LIMITATIONS: yard work  PERSONAL FACTORS: Time since onset of injury/illness/exacerbation and 1 comorbidity: previous breast surgery  are also affecting patient's functional outcome.   REHAB POTENTIAL: Excellent  CLINICAL DECISION MAKING: Stable/uncomplicated  EVALUATION COMPLEXITY: Low   GOALS: Goals reviewed with patient? Yes  SHORT TERM GOALS: Target date: 04/06/2023   Patient will be independent with initial HEP.  Baseline:  Goal status: MET 03/25/23   LONG TERM GOALS: Target date: 05/04/2023   Patient will be independent with advanced/ongoing HEP to improve outcomes and carryover.  Baseline:  Goal status: MET 04/29/23  2.  Patient will report 75% improvement in R shoulder pain to improve QOL.  Baseline:  Goal status: MET 75%   3.  Patient will report 10 on Quick Dash to demonstrate improved functional ability.  Baseline: 52.3 / 100 = 52.3 % Goal status: MET 13.6%  4.  Patient to improve R shoulder AROM to  [facility]  without pain provocation to allow patient to perform her normal chores/ADLs.  Baseline:  Goal status: MET 04/13/2023  5.  Patient will demonstrate improved functional UE strength as demonstrated by 5/5 MMT. Baseline:  Goal status: IN PROGRESS  6 .  Patient will be able to ride her horse with 75% less pain in her R shoulder.   Baseline:  Goal status: IN PROGRESS   7. Patient able to sleep without waking from pain Baseline:  Goal status: MET 04/13/2023   PLAN:  PT FREQUENCY: 1x/week  PT DURATION: 8 weeks  PLANNED INTERVENTIONS: Therapeutic exercises, Therapeutic activity, Neuromuscular  re-education, Patient/Family education, Self Care, Joint mobilization, Dry Needling, Electrical stimulation, Spinal mobilization, Cryotherapy, Moist heat, Taping, Ionotophoresis 4mg /ml Dexamethasone, and Manual therapy  PLAN FOR NEXT SESSION: Recertify for 8 more visits.  Assess response to DN, Continue DN if effective,  continue shoulder stability & strengthening.   Victorino Dike B. Rahim Astorga, PT 04/29/23 2:42 PM [facility] [address] [place], [place] [address] Phone # [phone] Fax [phone]

## 2023-05-02 ENCOUNTER — Other Ambulatory Visit: Payer: Self-pay | Admitting: Sports Medicine

## 2023-05-06 ENCOUNTER — Ambulatory Visit: Payer: 59

## 2023-05-06 DIAGNOSIS — M25611 Stiffness of right shoulder, not elsewhere classified: Secondary | ICD-10-CM

## 2023-05-06 DIAGNOSIS — G8929 Other chronic pain: Secondary | ICD-10-CM

## 2023-05-06 DIAGNOSIS — M6281 Muscle weakness (generalized): Secondary | ICD-10-CM

## 2023-05-06 DIAGNOSIS — M25612 Stiffness of left shoulder, not elsewhere classified: Secondary | ICD-10-CM

## 2023-05-06 DIAGNOSIS — M25511 Pain in right shoulder: Secondary | ICD-10-CM | POA: Diagnosis not present

## 2023-05-06 DIAGNOSIS — R252 Cramp and spasm: Secondary | ICD-10-CM

## 2023-05-06 DIAGNOSIS — R293 Abnormal posture: Secondary | ICD-10-CM

## 2023-05-06 NOTE — Therapy (Signed)
OUTPATIENT PHYSICAL THERAPY SHOULDER TREATMENT   Patient Name: Melissa Mcgrath MRN: [id number] DOB:Nov 21, 1960, 62 y.o., female Today's Date: 05/06/2023  END OF SESSION:  PT End of Session - 05/06/23 1445     Visit Number 12    Date for PT Re-Evaluation 06/23/23    Authorization Type Jaye Beagle    Authorization Time Period 03/30/2023-07/28/2023    Authorization - Visit Number 12    Authorization - Number of Visits 16    Progress Note Due on Visit 16    PT Start Time 1410    PT Stop Time 1445    PT Time Calculation (min) 35 min    Activity Tolerance Patient tolerated treatment well    Behavior During Therapy [facility] for tasks assessed/performed                    Past Medical History:  Diagnosis Date   Anemia    Depression 01/05/2014   Facial basal cell cancer    Family history of breast cancer    Family history of melanoma    Family history of ovarian cancer    Family history of pancreatic cancer    History of radiation therapy    right chest wall and subclavian 06/11/2021-07/26/2021  Dr Antony Blackbird   Menopause 01/05/2014   Port-A-Cath in place 10/04/2020   Vaginal atrophy 11/09/2014   Past Surgical History:  Procedure Laterality Date   BILATERAL TOTAL MASTECTOMY WITH AXILLARY LYMPH NODE DISSECTION Right 04/25/2021   Procedure: RIGHT TOTAL MASTECTOMY WITH RIGHT AXILLARY LYMPH NODE DISSECTION;  Surgeon: Almond Lint, MD;  Location: [facility] OR;  Service: General;  Laterality: Right;   BREAST ENHANCEMENT SURGERY     BREAST IMPLANT REMOVAL Bilateral 04/25/2021   Procedure: REMOVAL BREAST IMPLANTS;  Surgeon: Almond Lint, MD;  Location: [facility] OR;  Service: General;  Laterality: Bilateral;   CARPAL TUNNEL RELEASE Right    PORT-A-CATH REMOVAL N/A 10/15/2021   Procedure: REMOVAL PORT-A-CATH;  Surgeon: Almond Lint, MD;  Location: [facility];  Service: General;  Laterality: N/A;   PORTACATH PLACEMENT N/A 09/17/2020   Procedure: INSERTION PORT-A-CATH;  Surgeon: Almond Lint, MD;   Location: [facility];  Service: General;  Laterality: N/A;   refractive lensectomy Bilateral    SCAR REVISION Left 10/15/2021   Procedure: REVISION OF LEFT MASTECTOMY SCAR;  Surgeon: Almond Lint, MD;  Location: [facility];  Service: General;  Laterality: Left;   TOTAL MASTECTOMY Left 04/25/2021   Procedure: LEFT TOTAL MASTECTOMY;  Surgeon: Almond Lint, MD;  Location: [facility];  Service: General;  Laterality: Left;   Patient Active Problem List   Diagnosis Date Noted   Chronic right shoulder pain 03/04/2023   Cough 10/17/2022   Encounter for lipid screening for cardiovascular disease 10/17/2022   Diabetes mellitus screening 10/17/2022   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   Genetic testing 09/18/2020   History of augmentation mammoplasty 08/31/2020   Family history of breast cancer    Family history of pancreatic cancer    Family history of melanoma    Family history of ovarian cancer    Malignant neoplasm of upper-outer quadrant of right breast in female, estrogen receptor positive (HCC) 08/28/2020   Vaginal atrophy 11/09/2014   Menopause 01/05/2014    PCP: Elenore Paddy, NP   REFERRING PROVIDER: Marisa Cyphers, MD   REFERRING DIAG: [phone] (ICD-10-CM) - Chronic right shoulder pain  Evaluate and treat for right shoulder rotator cuff tendinopathy and AC joint arthritis.   THERAPY DIAG:  Chronic right shoulder pain  Stiffness of left shoulder, not elsewhere classified  Stiffness of right shoulder, not elsewhere classified  Muscle weakness (generalized)  Cramp and spasm  Abnormal posture  Rationale for Evaluation and Treatment: Rehabilitation  ONSET DATE: March 2024  SUBJECTIVE:                                                                                                                                                                                      SUBJECTIVE STATEMENT:   Patient reports she felt some "softening" of the right under arm and  side area after needling last visit.  She has not tried the nitroglycerin patches.   Hand dominance: Right  PERTINENT HISTORY: B mastectomy; Rt Axillary node dissection and radiation   PAIN: 03/30/2023 Are you having pain? Yes: NPRS scale: 5/10 Pain location: ant right shoulder to laterl and post spots Pain description: sharp and then aches Aggravating factors: riding, vacuuming, sleeping, reaching OH to water flowers Relieving factors: ice, Meloxicam, rest  PRECAUTIONS: Other: R breast CA and subsequent surgeries; high risk Rt arm lymphedema; no repetitive exercise with Rt arm; no heat/ice to Rt arm  RED FLAGS: None   WEIGHT BEARING RESTRICTIONS: No  FALLS:  Has patient fallen in last 6 months? 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   PATIENT SURVEYS:  Quick Dash 52.3 / 100 = 52.3 % 04/13/2023 Quick Dash 13.6% (goal met)  COGNITION: Overall cognitive status: Within functional limits for tasks assessed     SENSATION: WFL  POSTURE: Mild R winging of scapula, mild fwd head  UPPER EXTREMITY ROM: Functional IR - thumb to T7 L, L1 R   A/P ROM Right eval Left eval  Shoulder flexion 145/165 170/180  Shoulder extension    Shoulder abduction 102/135 148/180  Shoulder adduction    Shoulder internal rotation 63/75 53/80  Shoulder external rotation 58/72   Elbow flexion    Elbow extension    (Blank rows = not tested)  04/29/23: all are WFL , continued restriction at end ranges on right   UPPER EXTREMITY MMT:  MMT Right eval Left eval  Shoulder flexion 5 5   Shoulder extension 4- 4+  Shoulder abduction 4+ 5  Shoulder adduction    Shoulder internal rotation 4+ 5  Shoulder external  rotation 5 5  Middle trapezius    Lower trapezius    Elbow flexion 5   Elbow extension    (Blank rows = not tested)    04/29/23: Generally 4 to 5-/5 throughout, able to do basic ADL's and IADL's  SHOULDER SPECIAL TESTS: Impingement tests: Neer impingement test: negative and Hawkins/Kennedy impingement test: positive   Rotator cuff assessment: Drop arm test: negative, Empty can test: negative, Full can test: negative, and Gerber lift off test: negative    PALPATION:  Left levator, UT, lats, deltoids, pectorals   TODAY'S TREATMENT:                                                                                                                                         DATE:  05/06/23 PROM right shoulder all planes of motion Trigger Point Dry-Needling  Treatment instructions: Expect mild to moderate muscle soreness. S/S of pneumothorax if dry needled over a lung field, and to seek immediate medical attention should they occur. Patient verbalized understanding of these instructions and education. Patient Consent Given: Yes Education handout provided: Yes Muscles treated: right latissimus, right incisions using pincer grip on all available tissue Electrical stimulation performed: No Parameters: N/A Treatment response/outcome: Skilled palpation used to identify taut bands, adhesions and trigger points.  Once identified, dry needling techniques used to treat these areas.  Twitch response ellicited in latissimus along with palpable elongation of muscle.  Adhesion needling tolerated without pain.  Following treatment, patient denies any discomfort and mild soreness in right lat area.    04/29/23 Trigger Point Dry-Needling  Treatment instructions: Expect mild to moderate muscle soreness. S/S of pneumothorax if dry needled over a lung field, and to seek immediate medical  attention should they occur. Patient verbalized understanding of these instructions and education. Patient Consent Given: Yes Education handout provided: Yes Muscles treated: right latissimus, right incisions using pincer grip on all available tissue Electrical stimulation performed: No Parameters: N/A Treatment response/outcome: Skilled palpation used to identify taut bands, adhesions and trigger points.  Once identified, dry needling techniques used to treat these areas.  Twitch response ellicited in latissimus along with palpable elongation of muscle.  Adhesion needling tolerated without pain.  Following treatment, patient denies any discomfort and mild soreness in right lat area.    04/22/23 (no UBE due to lymphedema) 3 way Scap stabilization 2x10 Blue loop (both) Wall Clocks 2# plyoball x 8 Wall Push up 2 x 10 Lat Pull Downs 25lbs 2 x 10 Shoulder Rows at cable column 2x10 15# Triceps Extension 15# 2 x 10 Walking planks at barre x 4 Shoulder flexion abduction against blue loop x 10 Fwd bent shoulder extension, rows and horizontal abduction with 2 lbs  2x10 each  Standing shoulder flexion x 20 with 1 lb Standing shoulder scaption x 20 with 1 lb Around the world's 1# DB x 10 Standing shoulder ER red TB 2  x 10 Shoulder flexion with iso ER against yellow TB (hooked on arm rail) 2 x 10 Posterior shoulder capsule stretch 2 x 30 sec Standing biceps stretch (hands behind back) x 5 Standing pecs in corner 8 x 5 sec hold   PATIENT EDUCATION: Education details: PT eval findings, anticipated POC, initial HEP, and role of DN  Person educated: Patient Education method: Explanation, Demonstration, and Handouts Education comprehension: verbalized understanding and returned demonstration  HOME EXERCISE PROGRAM: Access Code: [facility] URL: [email or web address] Date: 03/30/2023 Prepared by: Claude Manges  Exercises - Supine Shoulder Flexion Extension AAROM with Dowel  - 1 x  daily - 7 x weekly - 1-2 sets - 10 reps - 5 seconds hold - Supine Shoulder External Rotation with Dowel  - 1 x daily - 7 x weekly - 1-2 sets - 10 reps - 5 sseconds hold - Shoulder Scaption AAROM with Dowel  - 1 x daily - 7 x weekly - 1-2 sets - 10 reps - 3-5 seconds hold - Standing Shoulder Extension with Dowel  - 1 x daily - 7 x weekly - 3 sets - 10 reps - Standing Shoulder External Rotation Stretch in Doorway (Mirrored)  - 2 x daily - 7 x weekly - 1 sets - 3 reps - 15-30 sec hold - Prone Shoulder Extension - Single Arm  - 2 x daily - 7 x weekly - 2 sets - 10 reps - Prone Shoulder Row  - 2 x daily - 7 x weekly - 2 sets - 10 reps - Prone Single Arm Shoulder Horizontal Abduction with Scapular Retraction and Palm Down  - 2 x daily - 7 x weekly - 2 sets - 10 reps - Sidelying Shoulder External Rotation  - 2 x daily - 7 x weekly - 2 sets - 10 reps - Single Arm Serratus Punches in Supine with Dumbbell  - 2 x daily - 7 x weekly - 2 sets - 10 reps - Standing Shoulder Flexion to 90 Degrees with Dumbbells  - 2 x daily - 7 x weekly - 2 sets - 10 reps - Standing Shoulder Scaption  - 2 x daily - 7 x weekly - 2 sets - 10 reps - Shoulder External Rotation and Scapular Retraction with Resistance  - 1 x daily - 7 x weekly - 2 sets - 10 reps - Standing Shoulder Horizontal Abduction with Resistance  - 1 x daily - 7 x weekly - 2 sets - 10 reps - Standing Shoulder Row with Anchored Resistance  - 1 x daily - 7 x weekly - 2 sets - 10 reps  ASSESSMENT:  CLINICAL IMPRESSION: Today's treatment session focused on 2nd session of dry needling of right latissimus and areas of scarring and adhesions.   She tolerated this without pain or discomfort.  At end of treatment, she reported mild soreness in the right latissimus.  We would like to try several sessions of the dry needling to assess response and progress with adhesions. We will recertify for another 8 weeks at 1 time per week to attempt dry needling and assess response.    Patient will benefit from skilled PT to address the below impairments and improve overall function.  OBJECTIVE IMPAIRMENTS: decreased activity tolerance, decreased ROM, decreased strength, increased muscle spasms, impaired flexibility, impaired UE functional use, postural dysfunction, and pain.   ACTIVITY LIMITATIONS: carrying, lifting, sleeping, bathing, and reach over head  PARTICIPATION LIMITATIONS: yard work  PERSONAL FACTORS: Time since onset of injury/illness/exacerbation and 1 comorbidity: previous breast  surgery  are also affecting patient's functional outcome.   REHAB POTENTIAL: Excellent  CLINICAL DECISION MAKING: Stable/uncomplicated  EVALUATION COMPLEXITY: Low   GOALS: Goals reviewed with patient? Yes  SHORT TERM GOALS: Target date: 04/06/2023   Patient will be independent with initial HEP.  Baseline:  Goal status: MET 03/25/23   LONG TERM GOALS: Target date: 05/04/2023   Patient will be independent with advanced/ongoing HEP to improve outcomes and carryover.  Baseline:  Goal status: MET 04/29/23  2.  Patient will report 75% improvement in R shoulder pain to improve QOL.  Baseline:  Goal status: MET 75%   3.  Patient will report 52 on Quick Dash to demonstrate improved functional ability.  Baseline: 52.3 / 100 = 52.3 % Goal status: MET 13.6%  4.  Patient to improve R shoulder AROM to  [facility]  without pain provocation to allow patient to perform her normal chores/ADLs.  Baseline:  Goal status: MET 04/13/2023  5.  Patient will demonstrate improved functional UE strength as demonstrated by 5/5 MMT. Baseline:  Goal status: IN PROGRESS  6 .  Patient will be able to ride her horse with 75% less pain in her R shoulder.   Baseline:  Goal status: IN PROGRESS   7. Patient able to sleep without waking from pain Baseline:  Goal status: MET 04/13/2023   PLAN:  PT FREQUENCY: 1x/week  PT DURATION: 8 weeks  PLANNED INTERVENTIONS: Therapeutic exercises,  Therapeutic activity, Neuromuscular re-education, Patient/Family education, Self Care, Joint mobilization, Dry Needling, Electrical stimulation, Spinal mobilization, Cryotherapy, Moist heat, Taping, Ionotophoresis 4mg /ml Dexamethasone, and Manual therapy  PLAN FOR NEXT SESSION: Assess response to DN #2, Continue DN if effective,  continue shoulder stability & strengthening.   Victorino Dike B. Jessiah Wojnar, PT 05/06/23 9:34 PM [facility] [address] [place], [place] [address] Phone # [phone] Fax [phone]

## 2023-05-13 ENCOUNTER — Ambulatory Visit: Payer: 59

## 2023-05-13 DIAGNOSIS — R293 Abnormal posture: Secondary | ICD-10-CM

## 2023-05-13 DIAGNOSIS — G8929 Other chronic pain: Secondary | ICD-10-CM

## 2023-05-13 DIAGNOSIS — M25612 Stiffness of left shoulder, not elsewhere classified: Secondary | ICD-10-CM

## 2023-05-13 DIAGNOSIS — M25511 Pain in right shoulder: Secondary | ICD-10-CM | POA: Diagnosis not present

## 2023-05-13 DIAGNOSIS — M25611 Stiffness of right shoulder, not elsewhere classified: Secondary | ICD-10-CM

## 2023-05-13 DIAGNOSIS — M6281 Muscle weakness (generalized): Secondary | ICD-10-CM

## 2023-05-13 DIAGNOSIS — R252 Cramp and spasm: Secondary | ICD-10-CM

## 2023-05-13 NOTE — Telephone Encounter (Signed)
Telephone call  

## 2023-05-13 NOTE — Therapy (Signed)
OUTPATIENT PHYSICAL THERAPY SHOULDER TREATMENT   Patient Name: Melissa Mcgrath MRN: [id number] DOB:April 18, 1961, 62 y.o., female Today's Date: 05/13/2023  END OF SESSION:  PT End of Session - 05/13/23 1411     Visit Number 13    Date for PT Re-Evaluation 06/23/23    Authorization Type Jaye Beagle    Authorization Time Period 03/30/2023-07/28/2023    Authorization - Number of Visits 16    Progress Note Due on Visit 16    PT Start Time 1400    PT Stop Time 1445    PT Time Calculation (min) 45 min    Activity Tolerance Patient tolerated treatment well    Behavior During Therapy [facility] for tasks assessed/performed                    Past Medical History:  Diagnosis Date   Anemia    Depression 01/05/2014   Facial basal cell cancer    Family history of breast cancer    Family history of melanoma    Family history of ovarian cancer    Family history of pancreatic cancer    History of radiation therapy    right chest wall and subclavian 06/11/2021-07/26/2021  Dr Antony Blackbird   Menopause 01/05/2014   Port-A-Cath in place 10/04/2020   Vaginal atrophy 11/09/2014   Past Surgical History:  Procedure Laterality Date   BILATERAL TOTAL MASTECTOMY WITH AXILLARY LYMPH NODE DISSECTION Right 04/25/2021   Procedure: RIGHT TOTAL MASTECTOMY WITH RIGHT AXILLARY LYMPH NODE DISSECTION;  Surgeon: Almond Lint, MD;  Location: [facility] OR;  Service: General;  Laterality: Right;   BREAST ENHANCEMENT SURGERY     BREAST IMPLANT REMOVAL Bilateral 04/25/2021   Procedure: REMOVAL BREAST IMPLANTS;  Surgeon: Almond Lint, MD;  Location: [facility] OR;  Service: General;  Laterality: Bilateral;   CARPAL TUNNEL RELEASE Right    PORT-A-CATH REMOVAL N/A 10/15/2021   Procedure: REMOVAL PORT-A-CATH;  Surgeon: Almond Lint, MD;  Location: [facility] OR;  Service: General;  Laterality: N/A;   PORTACATH PLACEMENT N/A 09/17/2020   Procedure: INSERTION PORT-A-CATH;  Surgeon: Almond Lint, MD;  Location: [facility];   Service: General;  Laterality: N/A;   refractive lensectomy Bilateral    SCAR REVISION Left 10/15/2021   Procedure: REVISION OF LEFT MASTECTOMY SCAR;  Surgeon: Almond Lint, MD;  Location: [facility] OR;  Service: General;  Laterality: Left;   TOTAL MASTECTOMY Left 04/25/2021   Procedure: LEFT TOTAL MASTECTOMY;  Surgeon: Almond Lint, MD;  Location: [facility] OR;  Service: General;  Laterality: Left;   Patient Active Problem List   Diagnosis Date Noted   Chronic right shoulder pain 03/04/2023   Cough 10/17/2022   Encounter for lipid screening for cardiovascular disease 10/17/2022   Diabetes mellitus screening 10/17/2022   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   Genetic testing 09/18/2020   History of augmentation mammoplasty 08/31/2020   Family history of breast cancer    Family history of pancreatic cancer    Family history of melanoma    Family history of ovarian cancer    Malignant neoplasm of upper-outer quadrant of right breast in female, estrogen receptor positive (HCC) 08/28/2020   Vaginal atrophy 11/09/2014   Menopause 01/05/2014    PCP: Elenore Paddy, NP   REFERRING PROVIDER: Marisa Cyphers, MD   REFERRING DIAG: [phone] (ICD-10-CM) - Chronic right shoulder pain  Evaluate and treat for right shoulder rotator cuff tendinopathy and AC joint arthritis.   THERAPY DIAG:  Chronic right shoulder pain  Stiffness of  left shoulder, not elsewhere classified  Stiffness of right shoulder, not elsewhere classified  Muscle weakness (generalized)  Cramp and spasm  Abnormal posture  Rationale for Evaluation and Treatment: Rehabilitation  ONSET DATE: March 2024  SUBJECTIVE:                                                                                                                                                                                      SUBJECTIVE STATEMENT:   Patient reports she is feeling some hypersensitivity at the right longitudinal breast incision since beginning  the needling.    She admits, however, that she is doing more work overhead around the stables with less shoulder pain.    Hand dominance: Right  PERTINENT HISTORY: B mastectomy; Rt Axillary node dissection and radiation   PAIN: 05/13/2023 Are you having pain? Yes: NPRS scale: 5/10 Pain location: ant right shoulder to laterl and post spots Pain description: sharp and then aches Aggravating factors: riding, vacuuming, sleeping, reaching OH to water flowers Relieving factors: ice, Meloxicam, rest  PRECAUTIONS: Other: R breast CA and subsequent surgeries; high risk Rt arm lymphedema; no repetitive exercise with Rt arm; no heat/ice to Rt arm  RED FLAGS: None   WEIGHT BEARING RESTRICTIONS: No  FALLS:  Has patient fallen in last 6 months? 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   PATIENT SURVEYS:  Quick Dash 52.3 / 100 = 52.3 % 04/13/2023 Quick Dash 13.6% (goal met)  COGNITION: Overall cognitive status: Within functional limits for tasks assessed     SENSATION: WFL  POSTURE: Mild R winging of scapula, mild fwd head  UPPER EXTREMITY ROM: Functional IR - thumb to T7 L, L1 R   A/P ROM Right eval Left eval  Shoulder flexion 145/165 170/180  Shoulder extension    Shoulder abduction 102/135 148/180  Shoulder adduction    Shoulder internal rotation 63/75 53/80  Shoulder external rotation 58/72   Elbow flexion    Elbow extension    (Blank rows = not tested)  04/29/23: all are WFL , continued restriction at end ranges on right   UPPER EXTREMITY MMT:  MMT  Right eval Left eval  Shoulder flexion 5 5  Shoulder extension 4- 4+  Shoulder abduction 4+ 5  Shoulder adduction    Shoulder internal rotation  4+ 5  Shoulder external rotation 5 5  Middle trapezius    Lower trapezius    Elbow flexion 5   Elbow extension    (Blank rows = not tested)    04/29/23: Generally 4 to 5-/5 throughout, able to do basic ADL's and IADL's  SHOULDER SPECIAL TESTS: Impingement tests: Neer impingement test: negative and Hawkins/Kennedy impingement test: positive   Rotator cuff assessment: Drop arm test: negative, Empty can test: negative, Full can test: negative, and Gerber lift off test: negative    PALPATION:  Left levator, UT, lats, deltoids, pectorals   TODAY'S TREATMENT:                                                                                                                                         DATE:  05/13/23 PROM right shoulder all planes of motion Trigger Point Dry-Needling  Treatment instructions: Expect mild to moderate muscle soreness. S/S of pneumothorax if dry needled over a lung field, and to seek immediate medical attention should they occur. Patient verbalized understanding of these instructions and education. Patient Consent Given: Yes Education handout provided: Yes Muscles treated: right latissimus, right incisions using pincer grip on all available tissue Electrical stimulation performed: No Parameters: N/A Treatment response/outcome: Skilled palpation used to identify taut bands, adhesions and trigger points.  Once identified, dry needling techniques used to treat these areas.  Twitch response ellicited in latissimus along with palpable elongation of muscle.  Adhesion needling tolerated without pain.  Following treatment, patient denies any discomfort and mild soreness in right lat area.     05/06/23 PROM right shoulder all planes of motion Trigger Point Dry-Needling  Treatment instructions: Expect mild to moderate muscle  soreness. S/S of pneumothorax if dry needled over a lung field, and to seek immediate medical attention should they occur. Patient verbalized understanding of these instructions and education. Patient Consent Given: Yes Education handout provided: Yes Muscles treated: right latissimus, right incisions using pincer grip on all available tissue Electrical stimulation performed: No Parameters: N/A Treatment response/outcome: Skilled palpation used to identify taut bands, adhesions and trigger points.  Once identified, dry needling techniques used to treat these areas.  Twitch response ellicited in latissimus along with palpable elongation of muscle.  Adhesion needling tolerated without pain.  Following treatment, patient denies any discomfort and mild soreness in right lat area.    04/29/23 Trigger Point Dry-Needling  Treatment instructions: Expect mild to moderate muscle soreness. S/S of pneumothorax if dry needled over a lung field, and to seek immediate medical attention should they occur. Patient verbalized understanding of these instructions and education. Patient Consent Given: Yes Education handout provided: Yes Muscles treated: right latissimus, right incisions using pincer grip on all available tissue Electrical stimulation performed: No Parameters: N/A Treatment response/outcome: Skilled palpation used to identify taut bands, adhesions and trigger points.  Once identified, dry needling techniques used to treat these areas.  Twitch response ellicited in latissimus along with palpable elongation of muscle.  Adhesion needling tolerated without pain.  Following treatment, patient denies any discomfort and mild soreness in right lat area.     PATIENT EDUCATION: Education details: PT eval findings, anticipated POC, initial HEP, and role of DN  Person educated: Patient Education method: Explanation, Demonstration, and Handouts Education comprehension: verbalized understanding and returned  demonstration  HOME EXERCISE PROGRAM: Access Code: [facility] URL: [email or web address] Date: 03/30/2023 Prepared by: Claude Manges  Exercises - Supine Shoulder Flexion Extension AAROM with Dowel  - 1 x daily - 7 x weekly - 1-2 sets - 10 reps - 5 seconds hold - Supine Shoulder External Rotation with Dowel  - 1 x daily - 7 x weekly - 1-2 sets - 10 reps - 5 sseconds hold - Shoulder Scaption AAROM with Dowel  - 1 x daily - 7 x weekly - 1-2 sets - 10 reps - 3-5 seconds hold - Standing Shoulder Extension with Dowel  - 1 x daily - 7 x weekly - 3 sets - 10 reps - Standing Shoulder External Rotation Stretch in Doorway (Mirrored)  - 2 x daily - 7 x weekly - 1 sets - 3 reps - 15-30 sec hold - Prone Shoulder Extension - Single Arm  - 2 x daily - 7 x weekly - 2 sets - 10 reps - Prone Shoulder Row  - 2 x daily - 7 x weekly - 2 sets - 10 reps - Prone Single Arm Shoulder Horizontal Abduction with Scapular Retraction and Palm Down  - 2 x daily - 7 x weekly - 2 sets - 10 reps - Sidelying Shoulder External Rotation  - 2 x daily - 7 x weekly - 2 sets - 10 reps - Single Arm Serratus Punches in Supine with Dumbbell  - 2 x daily - 7 x weekly - 2 sets - 10 reps - Standing Shoulder Flexion to 90 Degrees with Dumbbells  - 2 x daily - 7 x weekly - 2 sets - 10 reps - Standing Shoulder Scaption  - 2 x daily - 7 x weekly - 2 sets - 10 reps - Shoulder External Rotation and Scapular Retraction with Resistance  - 1 x daily - 7 x weekly - 2 sets - 10 reps - Standing Shoulder Horizontal Abduction with Resistance  - 1 x daily - 7 x weekly - 2 sets - 10 reps - Standing Shoulder Row with Anchored Resistance  - 1 x daily - 7 x weekly - 2 sets - 10 reps  ASSESSMENT:  CLINICAL IMPRESSION: Zhara had some sensitivity since last session around the main incision.  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    Patient will benefit from skilled PT to address the below impairments and improve  overall function.  OBJECTIVE IMPAIRMENTS: decreased activity tolerance, decreased ROM, decreased strength, increased muscle spasms, impaired flexibility, impaired UE functional use, postural dysfunction, and pain.   ACTIVITY LIMITATIONS: carrying, lifting, sleeping, bathing, and reach over head  PARTICIPATION LIMITATIONS: yard work  PERSONAL FACTORS: Time since onset of injury/illness/exacerbation and 1 comorbidity: previous breast surgery  are also affecting patient's functional outcome.   REHAB POTENTIAL: Excellent  CLINICAL DECISION MAKING: Stable/uncomplicated  EVALUATION COMPLEXITY: Low   GOALS: Goals reviewed with patient? Yes  SHORT TERM GOALS: Target date: 04/06/2023   Patient will be independent with initial HEP.  Baseline:  Goal status: MET 03/25/23   LONG TERM GOALS: Target  date: 05/04/2023   Patient will be independent with advanced/ongoing HEP to improve outcomes and carryover.  Baseline:  Goal status: MET 04/29/23  2.  Patient will report 75% improvement in R shoulder pain to improve QOL.  Baseline:  Goal status: MET 75%   3.  Patient will report 68 on Quick Dash to demonstrate improved functional ability.  Baseline: 52.3 / 100 = 52.3 % Goal status: MET 13.6%  4.  Patient to improve R shoulder AROM to  [facility]  without pain provocation to allow patient to perform her normal chores/ADLs.  Baseline:  Goal status: MET 04/13/2023  5.  Patient will demonstrate improved functional UE strength as demonstrated by 5/5 MMT. Baseline:  Goal status: IN PROGRESS  6 .  Patient will be able to ride her horse with 75% less pain in her R shoulder.   Baseline:  Goal status: IN PROGRESS   7. Patient able to sleep without waking from pain Baseline:  Goal status: MET 04/13/2023   PLAN:  PT FREQUENCY: 1x/week  PT DURATION: 8 weeks  PLANNED INTERVENTIONS: Therapeutic exercises, Therapeutic activity, Neuromuscular re-education, Patient/Family education, Self Care,  Joint mobilization, Dry Needling, Electrical stimulation, Spinal mobilization, Cryotherapy, Moist heat, Taping, Ionotophoresis 4mg /ml Dexamethasone, and Manual therapy  PLAN FOR NEXT SESSION: Resume shoulder and scapular stabilization, assess response to DN #3, Continue DN if effective.   Victorino Dike B. Thompson Mckim, PT 05/13/23 9:09 PM [facility] [address] [place], [place] [address] Phone # [phone] Fax [phone]

## 2023-05-27 ENCOUNTER — Ambulatory Visit: Payer: 59 | Attending: Sports Medicine

## 2023-05-27 DIAGNOSIS — M25611 Stiffness of right shoulder, not elsewhere classified: Secondary | ICD-10-CM | POA: Diagnosis present

## 2023-05-27 DIAGNOSIS — M25511 Pain in right shoulder: Secondary | ICD-10-CM | POA: Diagnosis present

## 2023-05-27 DIAGNOSIS — M6281 Muscle weakness (generalized): Secondary | ICD-10-CM | POA: Insufficient documentation

## 2023-05-27 DIAGNOSIS — M25612 Stiffness of left shoulder, not elsewhere classified: Secondary | ICD-10-CM | POA: Insufficient documentation

## 2023-05-27 DIAGNOSIS — G8929 Other chronic pain: Secondary | ICD-10-CM | POA: Diagnosis present

## 2023-05-27 DIAGNOSIS — R252 Cramp and spasm: Secondary | ICD-10-CM | POA: Insufficient documentation

## 2023-05-27 NOTE — Therapy (Addendum)
 OUTPATIENT PHYSICAL THERAPY SHOULDER TREATMENT PHYSICAL THERAPY DISCHARGE SUMMARY  Visits from Start of Care: 14  Current functional level related to goals / functional outcomes: See below    Remaining deficits: See below   Education / Equipment: See below   Patient agrees to discharge. Patient goals were partially met. Patient is being discharged due to not returning since the last visit.    Patient Name: Melissa Mcgrath MRN: [id number] DOB:January 25, 1961, 62 y.o., female Today's Date: 05/27/2023  END OF SESSION:  PT End of Session - 05/27/23 1023     Visit Number 14    Date for PT Re-Evaluation 06/23/23    Authorization Type [employer]    Authorization Time Period 03/30/2023-07/28/2023    Authorization - Visit Number 14    Authorization - Number of Visits 16    Progress Note Due on Visit 16    PT Start Time 1018    PT Stop Time 1058    PT Time Calculation (min) 40 min    Activity Tolerance Patient tolerated treatment well    Behavior During Therapy [facility] for tasks assessed/performed                    Past Medical History:  Diagnosis Date   Anemia    Depression 01/05/2014   Facial basal cell cancer    Family history of breast cancer    Family history of melanoma    Family history of ovarian cancer    Family history of pancreatic cancer    History of radiation therapy    right chest wall and subclavian 06/11/2021-07/26/2021  Dr Lynwood Nasuti   Menopause 01/05/2014   Port-A-Cath in place 10/04/2020   Vaginal atrophy 11/09/2014   Past Surgical History:  Procedure Laterality Date   BILATERAL TOTAL MASTECTOMY WITH AXILLARY LYMPH NODE DISSECTION Right 04/25/2021   Procedure: RIGHT TOTAL MASTECTOMY WITH RIGHT AXILLARY LYMPH NODE DISSECTION;  Surgeon: Aron Shoulders, MD;  Location: [facility] OR;  Service: General;  Laterality: Right;   BREAST ENHANCEMENT SURGERY     BREAST IMPLANT REMOVAL Bilateral 04/25/2021   Procedure: REMOVAL BREAST IMPLANTS;  Surgeon: Aron Shoulders,  MD;  Location: [facility] OR;  Service: General;  Laterality: Bilateral;   CARPAL TUNNEL RELEASE Right    PORT-A-CATH REMOVAL N/A 10/15/2021   Procedure: REMOVAL PORT-A-CATH;  Surgeon: Aron Shoulders, MD;  Location: [facility] OR;  Service: General;  Laterality: N/A;   PORTACATH PLACEMENT N/A 09/17/2020   Procedure: INSERTION PORT-A-CATH;  Surgeon: Aron Shoulders, MD;  Location: [facility];  Service: General;  Laterality: N/A;   refractive lensectomy Bilateral    SCAR REVISION Left 10/15/2021   Procedure: REVISION OF LEFT MASTECTOMY SCAR;  Surgeon: Aron Shoulders, MD;  Location: [facility] OR;  Service: General;  Laterality: Left;   TOTAL MASTECTOMY Left 04/25/2021   Procedure: LEFT TOTAL MASTECTOMY;  Surgeon: Aron Shoulders, MD;  Location: [facility] OR;  Service: General;  Laterality: Left;   Patient Active Problem List   Diagnosis Date Noted   Chronic right shoulder pain 03/04/2023   Cough 10/17/2022   Encounter for lipid screening for cardiovascular disease 10/17/2022   Diabetes mellitus screening 10/17/2022   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   Genetic testing 09/18/2020   History of augmentation mammoplasty 08/31/2020   Family history of breast cancer    Family history of pancreatic cancer    Family history of melanoma    Family history of ovarian cancer    Malignant neoplasm of upper-outer quadrant of right breast  in female, estrogen receptor positive (HCC) 08/28/2020   Vaginal atrophy 11/09/2014   Menopause 01/05/2014    PCP: Elnor Lauraine BRAVO, NP   REFERRING PROVIDER: Levorn Prentice BIRCH, MD   REFERRING DIAG: [id number] (ICD-10-CM) - Chronic right shoulder pain  Evaluate and treat for right shoulder rotator cuff tendinopathy and AC joint arthritis.   THERAPY DIAG:  Chronic right shoulder pain  Stiffness of left shoulder, not elsewhere classified  Stiffness of right shoulder, not elsewhere classified  Muscle weakness (generalized)  Cramp and spasm  Rationale for Evaluation and Treatment:  Rehabilitation  ONSET DATE: March 2024  SUBJECTIVE:                                                                                                                                                                                      SUBJECTIVE STATEMENT:   Patient reports she overdid it during Thanksgiving.    Hand dominance: Right  PERTINENT HISTORY: B mastectomy; Rt Axillary node dissection and radiation   PAIN: 05/13/2023 Are you having pain? Yes: NPRS scale: 5/10 Pain location: ant right shoulder to laterl and post spots Pain description: sharp and then aches Aggravating factors: riding, vacuuming, sleeping, reaching OH to water flowers Relieving factors: ice, Meloxicam , rest  PRECAUTIONS: Other: R breast CA and subsequent surgeries; high risk Rt arm lymphedema; no repetitive exercise with Rt arm; no heat/ice to Rt arm  RED FLAGS: None   WEIGHT BEARING RESTRICTIONS: No  FALLS:  Has patient fallen in last 6 months? 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   PATIENT SURVEYS:  Quick Dash 52.3 / 100 = 52.3 % 04/13/2023 Quick Dash 13.6% (goal met)  COGNITION: Overall cognitive status: Within functional limits for tasks assessed     SENSATION: WFL  POSTURE: Mild R winging of scapula, mild fwd head  UPPER EXTREMITY ROM: Functional IR - thumb to T7 L, L1 R   A/P ROM Right eval Left eval  Shoulder flexion 145/165 170/180  Shoulder extension    Shoulder abduction 102/135 148/180  Shoulder adduction    Shoulder internal rotation 63/75 53/80  Shoulder  external rotation 58/72   Elbow flexion    Elbow extension    (Blank rows = not tested)  04/29/23: all are WFL , continued restriction at end ranges on  right   UPPER EXTREMITY MMT:  MMT Right eval Left eval  Shoulder flexion 5 5  Shoulder extension 4- 4+  Shoulder abduction 4+ 5  Shoulder adduction    Shoulder internal rotation 4+ 5  Shoulder external rotation 5 5  Middle trapezius    Lower trapezius    Elbow flexion 5   Elbow extension    (Blank rows = not tested)    04/29/23: Generally 4 to 5-/5 throughout, able to do basic ADL's and IADL's  SHOULDER SPECIAL TESTS: Impingement tests: Neer impingement test: negative and Hawkins/Kennedy impingement test: positive   Rotator cuff assessment: Drop arm test: negative, Empty can test: negative, Full can test: negative, and Gerber lift off test: negative    PALPATION:  Left levator, UT, lats, deltoids, pectorals   TODAY'S TREATMENT:                                                                                                                                         DATE:  05/13/23 PROM right shoulder all planes of motion Trigger Point Dry-Needling  Treatment instructions: Expect mild to moderate muscle soreness. S/S of pneumothorax if dry needled over a lung field, and to seek immediate medical attention should they occur. Patient verbalized understanding of these instructions and education. Patient Consent Given: Yes Education handout provided: Yes Muscles treated: right latissimus, right incisions using pincer grip on all available tissue Electrical stimulation performed: No Parameters: N/A Treatment response/outcome: Skilled palpation used to identify taut bands, adhesions and trigger points.  Once identified, dry needling techniques used to treat these areas.  Twitch response ellicited in latissimus along with palpable elongation of muscle.  Adhesion needling tolerated without pain.  Following treatment, patient denies any  discomfort and mild soreness in right lat area.     05/06/23 PROM right shoulder all planes of motion Trigger Point Dry-Needling  Treatment instructions: Expect mild to moderate muscle soreness. S/S of pneumothorax if dry needled over a lung field, and to seek immediate medical attention should they occur. Patient verbalized understanding of these instructions and education. Patient Consent Given: Yes Education handout provided: Yes Muscles treated: right latissimus, right incisions using pincer grip on all available tissue Electrical stimulation performed: No Parameters: N/A Treatment response/outcome: Skilled palpation used to identify taut bands, adhesions and trigger points.  Once identified, dry needling techniques used to treat these areas.  Twitch response ellicited in latissimus along with palpable elongation of muscle.  Adhesion needling tolerated without pain.  Following treatment, patient denies any discomfort and mild soreness in right lat area.    04/29/23 Trigger Point Dry-Needling  Treatment instructions: Expect mild to moderate muscle soreness. S/S of pneumothorax if dry needled over a lung field, and to seek immediate medical attention should they occur. Patient verbalized understanding of these instructions and education. Patient Consent Given: Yes Education handout provided: Yes Muscles treated: right latissimus, right incisions using  pincer grip on all available tissue Electrical stimulation performed: No Parameters: N/A Treatment response/outcome: Skilled palpation used to identify taut bands, adhesions and trigger points.  Once identified, dry needling techniques used to treat these areas.  Twitch response ellicited in latissimus along with palpable elongation of muscle.  Adhesion needling tolerated without pain.  Following treatment, patient denies any discomfort and mild soreness in right lat area.     PATIENT EDUCATION: Education details: PT eval findings,  anticipated POC, initial HEP, and role of DN  Person educated: Patient Education method: Explanation, Demonstration, and Handouts Education comprehension: verbalized understanding and returned demonstration  HOME EXERCISE PROGRAM: Access Code: [facility] URL: [email or web address] Date: 03/30/2023 Prepared by: Kristeen Sar  Exercises - Supine Shoulder Flexion Extension AAROM with Dowel  - 1 x daily - 7 x weekly - 1-2 sets - 10 reps - 5 seconds hold - Supine Shoulder External Rotation with Dowel  - 1 x daily - 7 x weekly - 1-2 sets - 10 reps - 5 sseconds hold - Shoulder Scaption AAROM with Dowel  - 1 x daily - 7 x weekly - 1-2 sets - 10 reps - 3-5 seconds hold - Standing Shoulder Extension with Dowel  - 1 x daily - 7 x weekly - 3 sets - 10 reps - Standing Shoulder External Rotation Stretch in Doorway (Mirrored)  - 2 x daily - 7 x weekly - 1 sets - 3 reps - 15-30 sec hold - Prone Shoulder Extension - Single Arm  - 2 x daily - 7 x weekly - 2 sets - 10 reps - Prone Shoulder Row  - 2 x daily - 7 x weekly - 2 sets - 10 reps - Prone Single Arm Shoulder Horizontal Abduction with Scapular Retraction and Palm Down  - 2 x daily - 7 x weekly - 2 sets - 10 reps - Sidelying Shoulder External Rotation  - 2 x daily - 7 x weekly - 2 sets - 10 reps - Single Arm Serratus Punches in Supine with Dumbbell  - 2 x daily - 7 x weekly - 2 sets - 10 reps - Standing Shoulder Flexion to 90 Degrees with Dumbbells  - 2 x daily - 7 x weekly - 2 sets - 10 reps - Standing Shoulder Scaption  - 2 x daily - 7 x weekly - 2 sets - 10 reps - Shoulder External Rotation and Scapular Retraction with Resistance  - 1 x daily - 7 x weekly - 2 sets - 10 reps - Standing Shoulder Horizontal Abduction with Resistance  - 1 x daily - 7 x weekly - 2 sets - 10 reps - Standing Shoulder Row with Anchored Resistance  - 1 x daily - 7 x weekly - 2 sets - 10 reps  ASSESSMENT:  CLINICAL IMPRESSION: Chassidy had some sensitivity since  last session around the main incision.  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    Patient will benefit from skilled PT to address the below impairments and improve overall function.  OBJECTIVE IMPAIRMENTS: decreased activity tolerance, decreased ROM, decreased strength, increased muscle spasms, impaired flexibility, impaired UE functional use, postural dysfunction, and pain.   ACTIVITY LIMITATIONS: carrying, lifting, sleeping, bathing, and reach over head  PARTICIPATION LIMITATIONS: yard work  PERSONAL FACTORS: Time since onset of injury/illness/exacerbation and 1 comorbidity: previous breast surgery are also affecting patient's functional outcome.   REHAB POTENTIAL: Excellent  CLINICAL DECISION MAKING: Stable/uncomplicated  EVALUATION COMPLEXITY: Low  GOALS: Goals reviewed with patient? Yes  SHORT TERM GOALS: Target date: 04/06/2023   Patient will be independent with initial HEP.  Baseline:  Goal status: MET 03/25/23   LONG TERM GOALS: Target date: 05/04/2023   Patient will be independent with advanced/ongoing HEP to improve outcomes and carryover.  Baseline:  Goal status: MET 04/29/23  2.  Patient will report 75% improvement in R shoulder pain to improve QOL.  Baseline:  Goal status: MET 75%   3.  Patient will report 14 on Quick Dash to demonstrate improved functional ability.  Baseline: 52.3 / 100 = 52.3 % Goal status: MET 13.6%  4.  Patient to improve R shoulder AROM to [facility] without pain provocation to allow patient to perform her normal chores/ADLs.  Baseline:  Goal status: MET 04/13/2023  5.  Patient will demonstrate improved functional UE strength as demonstrated by 5/5 MMT. Baseline:  Goal status: IN PROGRESS  6 .  Patient will be able to ride her horse with 75% less pain in her R shoulder.   Baseline:  Goal status: IN PROGRESS   7. Patient able to sleep without waking from pain Baseline:  Goal status: MET  04/13/2023   PLAN:  PT FREQUENCY: 1x/week  PT DURATION: 8 weeks  PLANNED INTERVENTIONS: Therapeutic exercises, Therapeutic activity, Neuromuscular re-education, Patient/Family education, Self Care, Joint mobilization, Dry Needling, Electrical stimulation, Spinal mobilization, Cryotherapy, Moist heat, Taping, Ionotophoresis 4mg /ml Dexamethasone , and Manual therapy  PLAN FOR NEXT SESSION: Resume shoulder and scapular stabilization, assess response to DN #3, Continue DN if effective.   Delon B. Dollye Glasser, PT 05/27/23 1:24 PM [facility] [address] [place], [place] [address] Phone # [phone] Fax [phone]

## 2023-06-03 ENCOUNTER — Ambulatory Visit: Payer: 59

## 2023-06-03 ENCOUNTER — Ambulatory Visit: Payer: 59 | Admitting: Family Medicine

## 2023-06-05 ENCOUNTER — Telehealth: Payer: Self-pay | Admitting: Pharmacist

## 2023-06-05 NOTE — Telephone Encounter (Signed)
Left patient a message in regards to rescheduled appointment times/dates

## 2023-06-08 ENCOUNTER — Other Ambulatory Visit: Payer: 59

## 2023-06-08 ENCOUNTER — Inpatient Hospital Stay: Payer: 59

## 2023-06-08 ENCOUNTER — Inpatient Hospital Stay: Payer: 59 | Admitting: Pharmacist

## 2023-06-08 ENCOUNTER — Telehealth: Payer: Self-pay | Admitting: Pharmacist

## 2023-06-08 ENCOUNTER — Ambulatory Visit: Payer: 59 | Admitting: Pharmacist

## 2023-06-10 ENCOUNTER — Ambulatory Visit: Payer: 59

## 2023-06-10 ENCOUNTER — Other Ambulatory Visit: Payer: Self-pay | Admitting: Hematology and Oncology

## 2023-06-10 DIAGNOSIS — C50411 Malignant neoplasm of upper-outer quadrant of right female breast: Secondary | ICD-10-CM

## 2023-06-29 ENCOUNTER — Inpatient Hospital Stay: Payer: 59 | Admitting: Pharmacist

## 2023-06-29 ENCOUNTER — Inpatient Hospital Stay: Payer: 59

## 2023-06-30 ENCOUNTER — Encounter: Payer: Self-pay | Admitting: *Deleted

## 2023-06-30 NOTE — Progress Notes (Signed)
 Received VM from pt to reschedule missed appts from yesterday.  RN sent high priority message to scheduling team.

## 2023-07-01 ENCOUNTER — Telehealth: Payer: Self-pay | Admitting: Pharmacy Technician

## 2023-07-01 ENCOUNTER — Inpatient Hospital Stay: Payer: 59 | Admitting: Pharmacist

## 2023-07-01 ENCOUNTER — Inpatient Hospital Stay: Payer: 59 | Attending: Hematology and Oncology

## 2023-07-01 ENCOUNTER — Other Ambulatory Visit (HOSPITAL_COMMUNITY): Payer: Self-pay

## 2023-07-01 VITALS — BP 105/51 | HR 78 | Temp 98.2°F | Resp 18 | Wt 137.4 lb

## 2023-07-01 DIAGNOSIS — C50411 Malignant neoplasm of upper-outer quadrant of right female breast: Secondary | ICD-10-CM | POA: Insufficient documentation

## 2023-07-01 DIAGNOSIS — Z170 Estrogen receptor positive status [ER+]: Secondary | ICD-10-CM | POA: Diagnosis not present

## 2023-07-01 DIAGNOSIS — Z1721 Progesterone receptor positive status: Secondary | ICD-10-CM | POA: Insufficient documentation

## 2023-07-01 DIAGNOSIS — Z1732 Human epidermal growth factor receptor 2 negative status: Secondary | ICD-10-CM | POA: Insufficient documentation

## 2023-07-01 LAB — CBC WITH DIFFERENTIAL (CANCER CENTER ONLY)
Abs Immature Granulocytes: 0.02 10*3/uL (ref 0.00–0.07)
Basophils Absolute: 0.1 10*3/uL (ref 0.0–0.1)
Basophils Relative: 2 %
Eosinophils Absolute: 0.2 10*3/uL (ref 0.0–0.5)
Eosinophils Relative: 4 %
HCT: 31.6 % — ABNORMAL LOW (ref 36.0–46.0)
Hemoglobin: 10.6 g/dL — ABNORMAL LOW (ref 12.0–15.0)
Immature Granulocytes: 1 %
Lymphocytes Relative: 24 %
Lymphs Abs: 0.9 10*3/uL (ref 0.7–4.0)
MCH: 34.8 pg — ABNORMAL HIGH (ref 26.0–34.0)
MCHC: 33.5 g/dL (ref 30.0–36.0)
MCV: 103.6 fL — ABNORMAL HIGH (ref 80.0–100.0)
Monocytes Absolute: 0.3 10*3/uL (ref 0.1–1.0)
Monocytes Relative: 9 %
Neutro Abs: 2.4 10*3/uL (ref 1.7–7.7)
Neutrophils Relative %: 60 %
Platelet Count: 184 10*3/uL (ref 150–400)
RBC: 3.05 MIL/uL — ABNORMAL LOW (ref 3.87–5.11)
RDW: 11.9 % (ref 11.5–15.5)
WBC Count: 4 10*3/uL (ref 4.0–10.5)
nRBC: 0 % (ref 0.0–0.2)

## 2023-07-01 LAB — CMP (CANCER CENTER ONLY)
ALT: 10 U/L (ref 0–44)
AST: 16 U/L (ref 15–41)
Albumin: 4.1 g/dL (ref 3.5–5.0)
Alkaline Phosphatase: 71 U/L (ref 38–126)
Anion gap: 6 (ref 5–15)
BUN: 13 mg/dL (ref 8–23)
CO2: 29 mmol/L (ref 22–32)
Calcium: 9.5 mg/dL (ref 8.9–10.3)
Chloride: 107 mmol/L (ref 98–111)
Creatinine: 1.19 mg/dL — ABNORMAL HIGH (ref 0.44–1.00)
GFR, Estimated: 52 mL/min — ABNORMAL LOW (ref >60)
Glucose, Bld: 88 mg/dL (ref 70–99)
Potassium: 3.9 mmol/L (ref 3.5–5.1)
Sodium: 142 mmol/L (ref 135–145)
Total Bilirubin: 0.5 mg/dL (ref 0.0–1.2)
Total Protein: 7.2 g/dL (ref 6.5–8.1)

## 2023-07-01 NOTE — Progress Notes (Signed)
 [facility]       Telephone: [phone]?Fax: [phone]   Oncology Clinical Pharmacist Practitioner Progress Note  Melissa Mcgrath was contacted via in-person to discuss her chemotherapy regimen for abemaciclib  which they receive under the care of Dr. Vinay Gudena.   Current treatment regimen and start date Abemaciclib  (11/09/21) Decreased to 50 mg BID on 04/22/22 due to nausea, diarrhea Increased to 100 mg BID on 03/16/22 Started at 50 mg BID on 11/09/21 Letrozole  (07/10/21)   Interval History She continues  on abemaciclib  50 mg by mouth every 12 hours on days 1 to 28 of a 28-day cycle. This is being given in combination with letrozole . Therapy is planned to continue until  2 years in the adjuvant setting per the [facility] trial data .  Ms. Waldvogel was seen today by clinical pharmacy as a follow-up to her abemaciclib  management.  She last saw clinical pharmacy on 10/02/22 and Dr. Odean on 02/05/23. She also saw Morna Kendall NP on 04/06/23.   Response to Therapy Ms. Schomer is doing well. She was off abemaciclib  from a week before Thanksgiving and then started back around the [place]. She was not feeling well and then did not want to be on abemaciclib  during Christmas with family in town. She started back a few days after 06/24/23. She continues to have occasional loose stool. She continues to feel fatigued and at times short of breath which is stable. She sees Dr. Gudena with labs in 2 months and she prefers to continue abemaciclib  at least until then which would be two months short of her goal of 11/09/23. She started on 11/09/21. She mentioned today that [facility] is stating they need updated information to continue to cover the medication with a copay card. We gave this information to Claire Medlin today. Ms. Ems will reach out to her to discuss options. She has about 6 weeks left of abemaciclib  after this week per her report.  She also states she was on her feet  all day at the fair and after that her ankles were swollen and it continues now when she is on her feet a lot. She will be establishing care with Jeoffrey Barrio NP on 07/21/23 from [facility] who can access. We discussed that they may start her on a diuretic if they feel it is necessary. The swelling starts when she is on her feet and then gets better with reset. Labs, vitals, treatment parameters, and manufacturer guidelines assessing toxicity were reviewed with Alisa GORMAN Margo today. Her serum creatinine remains elevated but stable. She does report having a few episodes where she did not drink enough fluid and urine was a little darker in color. Based on these values, patient is in agreement to continue abemaciclib  therapy at this time.  Allergies Allergies  Allergen Reactions   Peanut-Containing Drug Products Nausea And Vomiting    Vitals    04/22/2023    1:54 PM 03/04/2023    1:57 PM 02/24/2023    1:54 PM  Oncology Vitals  Height 173 cm 173 cm 173 cm  Weight 61.236 kg 61.236 kg 61.236 kg  Weight (lbs) 135 lbs 135 lbs 135 lbs  BMI 20.53 kg/m2 20.53 kg/m2 20.53 kg/m2  BP 102/64 108/80 102/64  BSA (m2) 1.71 m2 1.71 m2 1.71 m2    Laboratory Data    Latest Ref Rng & Units 02/05/2023   10:27 AM 10/02/2022   10:29 AM 08/08/2022   11:05  AM  CBC EXTENDED  WBC 4.0 - 10.5 K/uL 3.2  3.1  3.2   RBC 3.87 - 5.11 MIL/uL 3.13  3.15  3.04   Hemoglobin 12.0 - 15.0 g/dL 88.9  88.5  88.9   HCT 36.0 - 46.0 % 32.4  32.7  31.5   Platelets 150 - 400 K/uL 191  211  210   NEUT# 1.7 - 7.7 K/uL 1.7  1.7  1.9   Lymph# 0.7 - 4.0 K/uL 1.0  0.8  0.8        Latest Ref Rng & Units 02/05/2023   10:27 AM 10/17/2022    3:24 PM 10/02/2022   10:29 AM  CMP  Glucose 70 - 99 mg/dL 87  79  54   BUN 8 - 23 mg/dL 10  17  16    Creatinine 0.44 - 1.00 mg/dL 8.96  8.75  8.97   Sodium 135 - 145 mmol/L 140  139  142   Potassium 3.5 - 5.1 mmol/L 4.0  4.1  4.0   Chloride 98 - 111 mmol/L 105  103   107   CO2 22 - 32 mmol/L 29  29  31    Calcium 8.9 - 10.3 mg/dL 9.3  9.7  89.8   Total Protein 6.5 - 8.1 g/dL 7.0  7.2  7.3   Total Bilirubin 0.3 - 1.2 mg/dL 0.5  0.3  0.5   Alkaline Phos 38 - 126 U/L 69  67  73   AST 15 - 41 U/L 14  17  15    ALT 0 - 44 U/L 9  11  10      Adverse Effects Assessment Fatigue: stable Loose stools: stable Serum creatinine: stable  Adherence Assessment Alisa GORMAN Margo reports missing several doses over the past 8 weeks.   Reason for missed dose: was not feeling well and then stayed of abemaciclib  through the holidays Patient was re-educated on importance of adherence.   Access Assessment ALFRED ECKLEY is currently receiving her abemaciclib  [facility]  Insurance concerns:  may need updated copay card. Estefana Moellers aware. Patient will reach out to her.  Medication Reconciliation The patient's medication list was reviewed today with the patient? Yes New medications or herbal supplements have recently been started? No  Any medications have been discontinued? No  The medication list was updated and reconciled based on the patient's most recent medication list in the electronic medical record (EMR) including herbal products and OTC medications.   Medications Current Outpatient Medications  Medication Sig Dispense Refill   acyclovir  (ZOVIRAX ) 400 MG tablet Take 1 tablet (400 mg total) by mouth 3 (three) times daily as needed (Cold sore). For 5-10 days, discontinue after 10 days or after symptom resolution. 30 tablet 3   albuterol  (VENTOLIN  HFA) 108 (90 Base) MCG/ACT inhaler Inhale 1-2 puffs into the lungs every 6 (six) hours as needed for wheezing or shortness of breath. 8 g 0   anastrozole  (ARIMIDEX ) 1 MG tablet Take 1 tablet (1 mg total) by mouth daily. 90 tablet 3   meloxicam  (MOBIC ) 15 MG tablet Take 1 tablet (15 mg total) by mouth daily. 30 tablet 1   nitroGLYCERIN  (NITRODUR - DOSED IN MG/24 HR) 0.2 mg/hr patch 1/4 patch  applied to your right shoulder once daily. 30 patch 0   VERZENIO  50 MG tablet TAKE 1 TABLET BY MOUTH 2 TIMES A DAY. SWALLOW TABLETS WHOLE. DO NOT CHEW, CRUSH, OR SPLIT TABLETS BEFORE SWALLOWING. 56 tablet 3   No  current facility-administered medications for this visit.    Drug-Drug Interactions (DDIs) DDIs were evaluated? Yes Significant DDIs? No  The patient was instructed to speak with their health care provider and/or the oral chemotherapy pharmacist before starting any new drug, including prescription or over the counter, natural / herbal products, or vitamins.  Supportive Care Diarrhea: we reviewed that diarrhea is common with abemaciclib  and confirmed that she does have loperamide (Imodium) at home.  We reviewed how to take this medication PRN. Neutropenia: we discussed the importance of having a thermometer and what the Centers for Disease Control and Prevention (CDC) considers a fever which is 100.48F (38C) or higher.  Gave patient 24/7 triage line to call if any fevers or symptoms. ILD/Pneumonitis: we reviewed potential symptoms including cough, shortness, and fatigue.  VTE: reviewed signs of DVT such as leg swelling, redness, pain, or tenderness and signs of PE such as shortness of breath, rapid or irregular heartbeat, cough, chest pain, or lightheadedness. Reviewed to take the medication every 12 hours (with food sometimes can be easier on the stomach) and to take it at the same time every day. Hepatotoxicity:WNL Drug interactions with grapefruit products  Dosing Assessment Hepatic adjustments needed? No  Renal adjustments needed? No  Toxicity adjustments needed? No  The current dosing regimen is appropriate to continue at this time.  Follow-Up Plan Continue abemaciclib  50 mg by mouth every 12 hours. She has a follow up with Dr. Odean with labs on 09/09/23 and will finish up adjuvant abemaciclib  around that time per her request. Continue letrozole  2.5 mg by mouth daily She  will establish care with Jeoffrey Barrio NP from [facility] Monitor ankle edema that occurs when on feet all day Monitor for worsening fatigue, loose stool. Labs, Dr. Odean visit, scheduled for 09/09/23. Ms. Rotondo plans to continue abemaciclib  at least through that visit which would be about 2 months short of the two year mark in the adjuvant setting. Started abemaciclib  on 11/09/21 Work with Claire Medlin if applicable for copay card and [facility]. She has about 6 weeks left of abemaciclib  after this week. She can follow up with clinical pharmacy as needed going forward.  Alisa GORMAN Paola participated in the discussion, expressed understanding, and voiced agreement with the above plan. All questions were answered to her satisfaction. The patient was advised to contact the clinic at [phone] with any questions or concerns prior to her return visit.   I spent 30 minutes assessing and educating the patient.  Jerral Mccauley A. Lucila, PharmD, BCOP, CPP  Norleen DELENA Lucila, RPH-CPP, 07/01/2023  1:24 PM   **Disclaimer: This note was dictated with voice recognition software. Similar sounding words can inadvertently be transcribed and this note may contain transcription errors which may not have been corrected upon publication of note.**

## 2023-07-01 NOTE — Telephone Encounter (Signed)
 Oral Oncology Patient Advocate Encounter   Was successful in obtaining a copay card for Verzenio .  This copay card will make the patients copay $0.  I have spoken with the patient.    The billing information is as follows and has been shared with [facility].   RxBin: [id number] PCN: [id number] Member ID: [id number] Group ID: [id number]   Estefana Moellers, CPhT-Adv Oncology Pharmacy Patient Advocate [facility] Direct Number: [phone]  Fax: [phone]

## 2023-07-17 ENCOUNTER — Ambulatory Visit
Admission: EM | Admit: 2023-07-17 | Discharge: 2023-07-17 | Disposition: A | Discharge status: Home / Self Care | Payer: 59 | Attending: Family Medicine | Admitting: Family Medicine

## 2023-07-17 DIAGNOSIS — S61211A Laceration without foreign body of left index finger without damage to nail, initial encounter: Secondary | ICD-10-CM

## 2023-07-17 MED ORDER — CEPHALEXIN 500 MG PO CAPS: 500.0000 mg | ORAL_CAPSULE | Freq: Two times a day (BID) | ORAL | 0 refills | Status: DC

## 2023-07-17 MED ORDER — MUPIROCIN 2 % EX OINT: 1.0000 | TOPICAL_OINTMENT | Freq: Two times a day (BID) | CUTANEOUS | 0 refills | Status: DC

## 2023-07-17 MED ORDER — CHLORHEXIDINE GLUCONATE 4 % EX SOLN: Freq: Every day | CUTANEOUS | 0 refills | Status: DC | PRN

## 2023-07-17 NOTE — ED Provider Notes (Signed)
[facility]    CSN: [id number] Arrival date & time: 07/17/23  8295      History   Chief Complaint No chief complaint on file.   HPI Melissa Mcgrath is a 63 y.o. female.   Patient presenting today with a laceration to the distal left index finger that occurred about 30 minutes prior to arrival when she was cutting bread with a sharp knife.  States she was unable to get the bleeding under control with pressure to wanted to come get it checked out.  Denies loss of range of motion, damage to the nail, concern for foreign body, numbness.  Tdap up-to-date, 03/2019.    Past Medical History:  Diagnosis Date   Anemia    Depression 01/05/2014   Facial basal cell cancer    Family history of breast cancer    Family history of melanoma    Family history of ovarian cancer    Family history of pancreatic cancer    History of radiation therapy    right chest wall and subclavian 06/11/2021-07/26/2021  Dr Antony Blackbird   Menopause 01/05/2014   Port-A-Cath in place 10/04/2020   Vaginal atrophy 11/09/2014    Patient Active Problem List   Diagnosis Date Noted   Chronic right shoulder pain 03/04/2023   Cough 10/17/2022   Encounter for lipid screening for cardiovascular disease 10/17/2022   Diabetes mellitus screening 10/17/2022   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   Genetic testing 09/18/2020   History of augmentation mammoplasty 08/31/2020   Family history of breast cancer    Family history of pancreatic cancer    Family history of melanoma    Family history of ovarian cancer    Malignant neoplasm of upper-outer quadrant of right breast in female, estrogen receptor positive (HCC) 08/28/2020   Vaginal atrophy 11/09/2014   Menopause 01/05/2014    Past Surgical History:  Procedure Laterality Date   BILATERAL TOTAL MASTECTOMY WITH AXILLARY LYMPH NODE DISSECTION Right 04/25/2021   Procedure: RIGHT TOTAL MASTECTOMY WITH RIGHT AXILLARY LYMPH NODE DISSECTION;  Surgeon: Almond Lint, MD;  Location: [facility];  Service: General;  Laterality: Right;   BREAST ENHANCEMENT SURGERY     BREAST IMPLANT REMOVAL Bilateral 04/25/2021   Procedure: REMOVAL BREAST IMPLANTS;  Surgeon: Almond Lint, MD;  Location: [facility];  Service: General;  Laterality: Bilateral;   CARPAL TUNNEL RELEASE Right    PORT-A-CATH REMOVAL N/A 10/15/2021   Procedure: REMOVAL PORT-A-CATH;  Surgeon: Almond Lint, MD;  Location: [facility];  Service: General;  Laterality: N/A;   PORTACATH PLACEMENT N/A 09/17/2020   Procedure: INSERTION PORT-A-CATH;  Surgeon: Almond Lint, MD;  Location: [facility];  Service: General;  Laterality: N/A;   refractive lensectomy Bilateral    SCAR REVISION Left 10/15/2021   Procedure: REVISION OF LEFT MASTECTOMY SCAR;  Surgeon: Almond Lint, MD;  [facility];  Service: General;  Laterality: Left;   TOTAL MASTECTOMY Left 04/25/2021   Procedure: LEFT TOTAL MASTECTOMY;  Surgeon: Almond Lint, MD;  [facility];  Service: General;  Laterality: Left;    OB History     Gravida  4   Para  2   Term      Preterm      AB  2   Living  2      SAB  2   IAB      Ectopic      Multiple      Live Births  Home Medications    Prior to Admission medications   Medication Sig Start Date End Date Taking? Authorizing Provider  cephALEXin (KEFLEX) 500 MG capsule Take 1 capsule (500 mg total) by mouth 2 (two) times daily. 07/17/23  Yes Particia Nearing, PA-C  chlorhexidine (HIBICLENS) 4 % external liquid Apply topically daily as needed. 07/17/23  Yes Particia Nearing, PA-C  mupirocin ointment (BACTROBAN) 2 % Apply 1 Application topically 2 (two) times daily. 07/17/23  Yes Particia Nearing, PA-C  acyclovir (ZOVIRAX) 400 MG tablet Take 1 tablet (400 mg total) by mouth 3 (three) times daily as needed (Cold sore). For 5-10 days, discontinue after 10 days or after symptom resolution. 10/17/22   Elenore Paddy, NP  albuterol (VENTOLIN HFA)  108 (90 Base) MCG/ACT inhaler Inhale 1-2 puffs into the lungs every 6 (six) hours as needed for wheezing or shortness of breath. 10/17/22   Elenore Paddy, NP  anastrozole (ARIMIDEX) 1 MG tablet Take 1 tablet (1 mg total) by mouth daily. 04/06/23   Loa Socks, NP  meloxicam (MOBIC) 15 MG tablet Take 1 tablet (15 mg total) by mouth daily. 03/04/23   Marisa Cyphers, MD  nitroGLYCERIN (NITRODUR - DOSED IN MG/24 HR) 0.2 mg/hr patch 1/4 patch applied to your right shoulder once daily. 04/22/23   Marisa Cyphers, MD  VERZENIO 50 MG tablet TAKE 1 TABLET BY MOUTH 2 TIMES A DAY. SWALLOW TABLETS WHOLE. DO NOT CHEW, CRUSH, OR SPLIT TABLETS BEFORE SWALLOWING. 06/10/23   Serena Croissant, MD    Family History Family History  Problem Relation Age of Onset   Breast cancer Mother 81       breast   Hypertension Father    Heart attack Father    Alzheimer's disease Father    Fibromyalgia Sister    Diabetes Sister    Heart attack Sister    Hypertension Sister    Hypertension Brother    Diabetes Brother    Pancreatic cancer Maternal Grandmother 52       Pancreatic   Melanoma Paternal Uncle        dx 23s   Melanoma Paternal Uncle        dx 40s   Ovarian cancer Paternal Aunt        dx 50s/60s    Social History Social History   Tobacco Use   Smoking status: Never   Smokeless tobacco: Never  Vaping Use   Vaping status: Never Used  Substance Use Topics   Alcohol use: No   Drug use: No     Allergies   Peanut-containing drug products   Review of Systems Review of Systems Per HPI  Physical Exam Triage Vital Signs ED Triage Vitals  Encounter Vitals Group     BP 07/17/23 1827 127/64     Systolic BP Percentile --      Diastolic BP Percentile --      Pulse Rate 07/17/23 1827 98     Resp 07/17/23 1827 16     Temp 07/17/23 1827 97.9 F (36.6 C)     Temp Source 07/17/23 1827 Oral     SpO2 07/17/23 1827 100 %     Weight --      Height --      Head Circumference --       Peak Flow --      Pain Score 07/17/23 1829 0     Pain Loc --      Pain Education --  Exclude from Growth Chart --    No data found.  Updated Vital Signs BP 127/64 (BP Location: Right Arm)   Pulse 98   Temp 97.9 F (36.6 C) (Oral)   Resp 16   SpO2 100%   Visual Acuity Right Eye Distance:   Left Eye Distance:   Bilateral Distance:    Right Eye Near:   Left Eye Near:    Bilateral Near:     Physical Exam Vitals and nursing note reviewed.  Constitutional:      Appearance: Normal appearance. She is not ill-appearing.  HENT:     Head: Atraumatic.  Eyes:     Extraocular Movements: Extraocular movements intact.     Conjunctiva/sclera: Conjunctivae normal.  Cardiovascular:     Rate and Rhythm: Normal rate.  Pulmonary:     Effort: Pulmonary effort is normal.  Musculoskeletal:        General: Tenderness and signs of injury present. No swelling or deformity. Normal range of motion.     Cervical back: Normal range of motion and neck supple.  Skin:    General: Skin is warm.     Comments: Superficial avulsion to the pad of the left index finger.  Bleeding fairly well-controlled after let gel applied with pressure dressing.  No foreign body appreciable  Neurological:     Mental Status: She is alert and oriented to person, place, and time.     Motor: No weakness.     Comments: Left upper extremity neurovascularly intact  Psychiatric:        Mood and Affect: Mood normal.        Thought Content: Thought content normal.        Judgment: Judgment normal.      UC Treatments / Results  Labs (all labs ordered are listed, but only abnormal results are displayed) Labs Reviewed - No data to display  EKG   Radiology No results found.  Procedures Procedures (including critical care time)  Medications Ordered in UC Medications - No data to display  Initial Impression / Assessment and Plan / UC Course  I have reviewed the triage vital signs and the nursing  notes.  Pertinent labs & imaging results that were available during my care of the patient were reviewed by me and considered in my medical decision making (see chart for details).     Let gel applied to help with bleeding, bleeding almost completely stopped prior to discharge.  Pressure dressing placed after wound cleaned thoroughly, will treat with Keflex, mupirocin, Hibiclens, elevation, over-the-counter pain relievers.  Return for worsening symptoms.  Final Clinical Impressions(s) / UC Diagnoses   Final diagnoses:  Laceration of left index finger without foreign body without damage to nail, initial encounter     Discharge Instructions      Clean the area at least once a day with the Hibiclens solution and apply the mupirocin ointment and a nonstick dressing.  I have sent in a course of antibiotics additionally.    ED Prescriptions     Medication Sig Dispense Auth. Provider   mupirocin ointment (BACTROBAN) 2 % Apply 1 Application topically 2 (two) times daily. 22 g Particia Nearing, [place]   chlorhexidine (HIBICLENS) 4 % external liquid Apply topically daily as needed. 236 mL Particia Nearing, PA-C   cephALEXin (KEFLEX) 500 MG capsule Take 1 capsule (500 mg total) by mouth 2 (two) times daily. 14 capsule Particia Nearing, [place]      PDMP not reviewed this encounter.  Particia Nearing, [place] 07/17/23 1933

## 2023-07-17 NOTE — ED Triage Notes (Signed)
Pt reports she was cutting with a sharp knife and cut her left index finger about 30 min ago.

## 2023-07-17 NOTE — ED Notes (Addendum)
Applied muipiricin ointment and a nonadherent dressing to pt left index finger and wrapped it in coban

## 2023-07-17 NOTE — Discharge Instructions (Signed)
Clean the area at least once a day with the Hibiclens solution and apply the mupirocin ointment and a nonstick dressing.  I have sent in a course of antibiotics additionally.

## 2023-07-21 ENCOUNTER — Encounter: Payer: Self-pay | Admitting: Family Medicine

## 2023-07-21 ENCOUNTER — Ambulatory Visit (INDEPENDENT_AMBULATORY_CARE_PROVIDER_SITE_OTHER): Payer: 59 | Admitting: Family Medicine

## 2023-07-21 VITALS — BP 130/70 | HR 83 | Temp 98.2°F | Ht 68.0 in | Wt 140.0 lb

## 2023-07-21 DIAGNOSIS — C439 Malignant melanoma of skin, unspecified: Secondary | ICD-10-CM | POA: Diagnosis not present

## 2023-07-21 DIAGNOSIS — Z85820 Personal history of malignant melanoma of skin: Secondary | ICD-10-CM

## 2023-07-21 DIAGNOSIS — Z13220 Encounter for screening for lipoid disorders: Secondary | ICD-10-CM

## 2023-07-21 DIAGNOSIS — Z0001 Encounter for general adult medical examination with abnormal findings: Secondary | ICD-10-CM | POA: Diagnosis not present

## 2023-07-21 DIAGNOSIS — Z1329 Encounter for screening for other suspected endocrine disorder: Secondary | ICD-10-CM

## 2023-07-21 DIAGNOSIS — F418 Other specified anxiety disorders: Secondary | ICD-10-CM | POA: Insufficient documentation

## 2023-07-21 DIAGNOSIS — Z0000 Encounter for general adult medical examination without abnormal findings: Secondary | ICD-10-CM | POA: Insufficient documentation

## 2023-07-21 NOTE — Assessment & Plan Note (Addendum)
Well controlled without medication. She denies SI/HI. Would not like referral to therapist or medication at this time. Recommend self care and mindfulness. Return to office as needed for better control.      07/21/2023   11:10 AM  GAD 7 : Generalized Anxiety Score  Nervous, Anxious, on Edge 2  Control/stop worrying 3  Worry too much - different things 3  Trouble relaxing 2  Restless 3  Easily annoyed or irritable 3  Afraid - awful might happen 3  Total GAD 7 Score 19  Anxiety Difficulty Somewhat difficult

## 2023-07-21 NOTE — Patient Instructions (Signed)
It was great to meet you today and I'm excited to have you join the [facility] practice. I hope you had a positive experience today! If you feel so inclined, please feel free to recommend our practice to friends and family. Kurtis Bushman, FNP-C

## 2023-07-21 NOTE — Assessment & Plan Note (Signed)
History of melanoma, she has new concerning lesions to her face and would like referral to in network dermatologist.

## 2023-07-21 NOTE — Assessment & Plan Note (Signed)

## 2023-07-21 NOTE — Progress Notes (Signed)
New Patient Office Visit  Subjective    Patient ID: Melissa Mcgrath, female    DOB: March 03, 1961  Age: 63 y.o. MRN: [id number]  CC: No chief complaint on file.   HPI Melissa Mcgrath presents to establish care. Oriented to practice routines and expectations. PMH includes anemia, depression, right shoulder rotator cuff tears, and breast cancer s/p bilateral mastectomy. She has been seeing a PCP regularly and does see OBGYN for well womens care  Breast CA screening: Mammogram status: No longer required due to double mastectomy. Cervical CA screening: was normal she does see OBGYN Colon CA screening: cologuard Tobacco: non-smoker STI: declines Vaccines:  declines    Outpatient Encounter Medications as of 07/21/2023  Medication Sig   acyclovir (ZOVIRAX) 400 MG tablet Take 1 tablet (400 mg total) by mouth 3 (three) times daily as needed (Cold sore). For 5-10 days, discontinue after 10 days or after symptom resolution.   albuterol (VENTOLIN HFA) 108 (90 Base) MCG/ACT inhaler Inhale 1-2 puffs into the lungs every 6 (six) hours as needed for wheezing or shortness of breath.   anastrozole (ARIMIDEX) 1 MG tablet Take 1 tablet (1 mg total) by mouth daily.   cephALEXin (KEFLEX) 500 MG capsule Take 1 capsule (500 mg total) by mouth 2 (two) times daily.   chlorhexidine (HIBICLENS) 4 % external liquid Apply topically daily as needed.   meloxicam (MOBIC) 15 MG tablet Take 1 tablet (15 mg total) by mouth daily.   mupirocin ointment (BACTROBAN) 2 % Apply 1 Application topically 2 (two) times daily.   VERZENIO 50 MG tablet TAKE 1 TABLET BY MOUTH 2 TIMES A DAY. SWALLOW TABLETS WHOLE. DO NOT CHEW, CRUSH, OR SPLIT TABLETS BEFORE SWALLOWING.   nitroGLYCERIN (NITRODUR - DOSED IN MG/24 HR) 0.2 mg/hr patch 1/4 patch applied to your right shoulder once daily. (Patient not taking: Reported on 07/21/2023)   No facility-administered encounter medications on file as of 07/21/2023.    Past Medical History:   Diagnosis Date   Anemia    Depression 01/05/2014   Facial basal cell cancer    Family history of breast cancer    Family history of melanoma    Family history of ovarian cancer    Family history of pancreatic cancer    History of radiation therapy    right chest wall and subclavian 06/11/2021-07/26/2021  Dr Antony Blackbird   Menopause 01/05/2014   Port-A-Cath in place 10/04/2020   Vaginal atrophy 11/09/2014    Past Surgical History:  Procedure Laterality Date   BILATERAL TOTAL MASTECTOMY WITH AXILLARY LYMPH NODE DISSECTION Right 04/25/2021   Procedure: RIGHT TOTAL MASTECTOMY WITH RIGHT AXILLARY LYMPH NODE DISSECTION;  Surgeon: Almond Lint, MD;  Location: [facility];  Service: General;  Laterality: Right;   BREAST ENHANCEMENT SURGERY     BREAST IMPLANT REMOVAL Bilateral 04/25/2021   Procedure: REMOVAL BREAST IMPLANTS;  Surgeon: Almond Lint, MD;  Location: [facility];  Service: General;  Laterality: Bilateral;   CARPAL TUNNEL RELEASE Right    PORT-A-CATH REMOVAL N/A 10/15/2021   Procedure: REMOVAL PORT-A-CATH;  Surgeon: Almond Lint, MD;  Location: [facility];  Service: General;  Laterality: N/A;   PORTACATH PLACEMENT N/A 09/17/2020   Procedure: INSERTION PORT-A-CATH;  Surgeon: Almond Lint, MD;  Location: [facility];  Service: General;  Laterality: N/A;   refractive lensectomy Bilateral    SCAR REVISION Left 10/15/2021   Procedure: REVISION OF LEFT MASTECTOMY SCAR;  Surgeon: Almond Lint, MD;  [facility]: [facility];  Service: General;  Laterality:  Left;   TOTAL MASTECTOMY Left 04/25/2021   Procedure: LEFT TOTAL MASTECTOMY;  Surgeon: Almond Lint, MD;  [facility]: [facility];  Service: General;  Laterality: Left;    Family History  Problem Relation Age of Onset   Breast cancer Mother 75       breast   Hypertension Father    Heart attack Father    Alzheimer's disease Father    Fibromyalgia Sister    Diabetes Sister    Heart attack Sister    Hypertension Sister    Hypertension Brother     Diabetes Brother    Pancreatic cancer Maternal Grandmother 80       Pancreatic   Melanoma Paternal Uncle        dx 68s   Melanoma Paternal Uncle        dx 54s   Ovarian cancer Paternal Aunt        dx 50s/60s    Social History   Socioeconomic History   Marital status: Married    Spouse name: Not on file   Number of children: Not on file   Years of education: Not on file   Highest education level: Not on file  Occupational History   Not on file  Tobacco Use   Smoking status: Never   Smokeless tobacco: Never  Vaping Use   Vaping status: Never Used  Substance and Sexual Activity   Alcohol use: No   Drug use: No   Sexual activity: Not Currently    Birth control/protection: Post-menopausal  Other Topics Concern   Not on file  Social History Narrative   Married 34 years,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   Social Drivers of Corporate investment banker Strain: Low Risk  (08/29/2020)   Overall Financial Resource Strain (CARDIA)    Difficulty of Paying Living Expenses: Not very hard  Food Insecurity: No Food Insecurity (07/22/2021)   Received from [facility], [facility]   Hunger Vital Sign    Worried About Running Out of Food in the Last Year: Never true    Ran Out of Food in the Last Year: Never true  Transportation Needs: No Transportation Needs (08/29/2020)   PRAPARE - Administrator, Civil Service (Medical): No    Lack of Transportation (Non-Medical): No  Physical Activity: Not on file  Stress: Not on file  Social Connections: Unknown (10/23/2021)   Received from [facility], [facility]   Social Network    Social Network: Not on file  Intimate Partner Violence: Unknown (09/26/2021)   Received from [facility]   HITS    Physically Hurt: Not on file    Insult or Talk Down To: Not on file    Threaten Physical Harm: Not on file    Scream or Curse: Not on file    Review of Systems  Constitutional: Negative.   HENT:  Negative.    Eyes: Negative.   Respiratory:  Positive for shortness of breath.   Cardiovascular: Negative.   Gastrointestinal: Negative.   Genitourinary: Negative.   Musculoskeletal: Negative.   Skin: Negative.   Neurological: Negative.   Endo/Heme/Allergies: Negative.   Psychiatric/Behavioral:  The patient is nervous/anxious.   All other systems reviewed and are negative.       Objective    BP 130/70   Pulse 83   Temp 98.2 F (36.8 C) (Oral)   Ht 5\' 8"  (1.727 m)   Wt 140 lb (63.5 kg)   SpO2 98%  BMI 21.29 kg/m   Physical Exam Vitals and nursing note reviewed.  Constitutional:      Appearance: Normal appearance. She is normal weight.  HENT:     Head: Normocephalic and atraumatic.     Right Ear: Tympanic membrane, ear canal and external ear normal.     Left Ear: Tympanic membrane, ear canal and external ear normal.     Nose: Nose normal.     Mouth/Throat:     Mouth: Mucous membranes are moist.     Pharynx: Oropharynx is clear.  Eyes:     Extraocular Movements: Extraocular movements intact.     Conjunctiva/sclera: Conjunctivae normal.     Pupils: Pupils are equal, round, and reactive to light.  Cardiovascular:     Rate and Rhythm: Normal rate and regular rhythm.     Pulses: Normal pulses.     Heart sounds: Normal heart sounds.  Pulmonary:     Effort: Pulmonary effort is normal.     Breath sounds: Normal breath sounds.  Chest:  Breasts:    Right: Absent.     Left: Absent.  Abdominal:     General: Bowel sounds are normal.     Palpations: Abdomen is soft.  Musculoskeletal:        General: Normal range of motion.     Cervical back: Normal range of motion and neck supple.  Skin:    General: Skin is warm and dry.     Capillary Refill: Capillary refill takes less than 2 seconds.  Neurological:     General: No focal deficit present.     Mental Status: She is alert and oriented to person, place, and time. Mental status is at baseline.  Psychiatric:         Mood and Affect: Mood normal.        Behavior: Behavior normal.        Thought Content: Thought content normal.        Judgment: Judgment normal.         Assessment & Plan:   Problem List Items Addressed This Visit     Situational anxiety   Well controlled without medication. She denies SI/HI. Would not like referral to therapist or medication at this time. Recommend self care and mindfulness. Return to office as needed for better control.      07/21/2023   11:10 AM  GAD 7 : Generalized Anxiety Score  Nervous, Anxious, on Edge 2  Control/stop worrying 3  Worry too much - different things 3  Trouble relaxing 2  Restless 3  Easily annoyed or irritable 3  Afraid - awful might happen 3  Total GAD 7 Score 19  Anxiety Difficulty Somewhat difficult          Physical exam, annual - Primary   Today your medical history was reviewed and routine physical exam with labs was performed. Recommend 150 minutes of moderate intensity exercise weekly and consuming a well-balanced diet. Advised to stop smoking if a smoker, avoid smoking if a non-smoker, limit alcohol consumption to 1 drink per day for women and 2 drinks per day for men, and avoid illicit drug use. Counseled in mental health awareness and when to seek medical care. Vaccine maintenance discussed. Appropriate health maintenance items reviewed. Return to office in 1 year for annual physical exam.       Recurrent skin melanoma (HCC)   History of melanoma, she has new concerning lesions to her face and would like referral to in network dermatologist.  Relevant Orders   Ambulatory referral to Dermatology   Other Visit Diagnoses       History of melanoma         Screening for lipoid disorders       Relevant Orders   Lipid panel     Screening for thyroid disorder       Relevant Orders   TSH       Return in about 1 year (around 07/20/2024) for annual physical with labs 1 week prior.   Park Meo, FNP

## 2023-07-22 ENCOUNTER — Other Ambulatory Visit: Payer: 59

## 2023-07-23 LAB — LIPID PANEL
Cholesterol: 204 mg/dL — ABNORMAL HIGH (ref <200)
HDL: 66 mg/dL (ref >=50)
LDL Cholesterol (Calc): 121 mg/dL — ABNORMAL HIGH
Non-HDL Cholesterol (Calc): 138 mg/dL — ABNORMAL HIGH (ref <130)
Total CHOL/HDL Ratio: 3.1 (calc) (ref <5.0)
Triglycerides: 73 mg/dL (ref <150)

## 2023-07-23 LAB — TSH: TSH: 2.49 m[IU]/L (ref 0.40–4.50)

## 2023-07-27 ENCOUNTER — Encounter: Payer: Self-pay | Admitting: Family Medicine

## 2023-08-19 ENCOUNTER — Ambulatory Visit (INDEPENDENT_AMBULATORY_CARE_PROVIDER_SITE_OTHER): Payer: 59 | Admitting: Dermatology

## 2023-08-19 ENCOUNTER — Encounter: Payer: Self-pay | Admitting: Dermatology

## 2023-08-19 VITALS — BP 111/60 | HR 80

## 2023-08-19 DIAGNOSIS — D229 Melanocytic nevi, unspecified: Secondary | ICD-10-CM

## 2023-08-19 DIAGNOSIS — L578 Other skin changes due to chronic exposure to nonionizing radiation: Secondary | ICD-10-CM

## 2023-08-19 DIAGNOSIS — D1801 Hemangioma of skin and subcutaneous tissue: Secondary | ICD-10-CM

## 2023-08-19 DIAGNOSIS — L570 Actinic keratosis: Secondary | ICD-10-CM

## 2023-08-19 DIAGNOSIS — D0472 Carcinoma in situ of skin of left lower limb, including hip: Secondary | ICD-10-CM

## 2023-08-19 DIAGNOSIS — W908XXA Exposure to other nonionizing radiation, initial encounter: Secondary | ICD-10-CM | POA: Diagnosis not present

## 2023-08-19 DIAGNOSIS — L309 Dermatitis, unspecified: Secondary | ICD-10-CM

## 2023-08-19 DIAGNOSIS — L821 Other seborrheic keratosis: Secondary | ICD-10-CM

## 2023-08-19 DIAGNOSIS — L814 Other melanin hyperpigmentation: Secondary | ICD-10-CM

## 2023-08-19 DIAGNOSIS — Z1283 Encounter for screening for malignant neoplasm of skin: Secondary | ICD-10-CM

## 2023-08-19 DIAGNOSIS — B001 Herpesviral vesicular dermatitis: Secondary | ICD-10-CM

## 2023-08-19 DIAGNOSIS — D492 Neoplasm of unspecified behavior of bone, soft tissue, and skin: Secondary | ICD-10-CM

## 2023-08-19 DIAGNOSIS — D485 Neoplasm of uncertain behavior of skin: Secondary | ICD-10-CM

## 2023-08-19 DIAGNOSIS — Z7189 Other specified counseling: Secondary | ICD-10-CM

## 2023-08-19 MED ORDER — VALACYCLOVIR HCL 500 MG PO TABS
500.0000 mg | ORAL_TABLET | Freq: Every day | ORAL | 1 refills | Status: AC
Start: 2023-08-19 — End: ?

## 2023-08-19 MED ORDER — FLUOROURACIL 5 % EX CREA
TOPICAL_CREAM | Freq: Two times a day (BID) | CUTANEOUS | 0 refills | Status: AC
Start: 2023-08-19 — End: 2023-09-02

## 2023-08-19 MED ORDER — CLOBETASOL PROPIONATE 0.05 % EX OINT
1.0000 | TOPICAL_OINTMENT | Freq: Two times a day (BID) | CUTANEOUS | 3 refills | Status: DC
Start: 2023-08-19 — End: 2023-09-09

## 2023-08-19 NOTE — Patient Instructions (Addendum)
 Important Information  Due to recent changes in healthcare laws, you may see results of your pathology and/or laboratory studies on MyChart before the doctors have had a chance to review them. We understand that in some cases there may be results that are confusing or concerning to you. Please understand that not all results are received at the same time and often the doctors may need to interpret multiple results in order to provide you with the best plan of care or course of treatment. Therefore, we ask that you please give [place] 2 business days to thoroughly review all your results before contacting the office for clarification. Should we see a critical lab result, you will be contacted sooner.   If You Need Anything After Your Visit  If you have any questions or concerns for your doctor, please call our main line at [phone] If no one answers, please leave a voicemail as directed and we will return your call as soon as possible. Messages left after 4 pm will be answered the following business day.   You may also send [place] a message via MyChart. We typically respond to MyChart messages within 1-2 business days.  For prescription refills, please ask your pharmacy to contact our office. Our fax number is [phone].  If you have an urgent issue when the clinic is closed that cannot wait until the next business day, you can page your doctor at the number below.    Please note that while we do our best to be available for urgent issues outside of office hours, we are not available 24/7.   If you have an urgent issue and are unable to reach [place], you may choose to seek medical care at your doctor's office, retail clinic, urgent care center, or emergency room.  If you have a medical emergency, please immediately call 911 or go to the emergency department. In the event of inclement weather, please call our main line at [phone] for an update on the status of any delays or  closures.  Dermatology Medication Tips: Please keep the boxes that topical medications come in in order to help keep track of the instructions about where and how to use these. Pharmacies typically print the medication instructions only on the boxes and not directly on the medication tubes.   If your medication is too expensive, please contact our office at [phone] or send [place] a message through MyChart.   We are unable to tell what your co-pay for medications will be in advance as this is different depending on your insurance coverage. However, we may be able to find a substitute medication at lower cost or fill out paperwork to get insurance to cover a needed medication.   If a prior authorization is required to get your medication covered by your insurance company, please allow [place] 1-2 business days to complete this process.  Drug prices often vary depending on where the prescription is filled and some pharmacies may offer cheaper prices.  The website www.goodrx.com contains coupons for medications through different pharmacies. The prices here do not account for what the cost may be with help from insurance (it may be cheaper with your insurance), but the website can give you the price if you did not use any insurance.  - You can print the associated coupon and take it with your prescription to the pharmacy.  - You may also stop by our office during regular business hours and pick up a GoodRx coupon card.  - If  you need your prescription sent electronically to a different pharmacy, notify our office through [facility] or by phone at [phone]    Skin Education :   I counseled the patient regarding the following: Sun screen (SPF 30 or greater) should be applied during peak UV exposure (between 10am and 2pm) and reapplied after exercise or swimming.  The ABCDEs of melanoma were reviewed with the patient, and the importance of monthly self-examination of moles was emphasized.  Should any moles change in shape or color, or itch, bleed or burn, pt will contact our office for evaluation sooner then their interval appointment.  Plan: Sunscreen Recommendations I recommended a broad spectrum sunscreen with a SPF of 30 or higher. I explained that SPF 30 sunscreens block approximately 97 percent of the sun's harmful rays. Sunscreens should be applied at least 15 minutes prior to expected sun exposure and then every 2 hours after that as long as sun exposure continues. If swimming or exercising sunscreen should be reapplied every 45 minutes to an hour after getting wet or sweating. One ounce, or the equivalent of a shot glass full of sunscreen, is adequate to protect the skin not covered by a bathing suit. I also recommended a lip balm with a sunscreen as well. Sun protective clothing can be used in lieu of sunscreen but must be worn the entire time you are exposed to the sun's rays. For areas treated with Liquid Nitrogen:  Keep clean with soap and water.  Apply Vaseline or Aquaphor twice daily. Actinic keratoses are precancerous spots that appear secondary to cumulative UV radiation exposure/sun exposure over time. They are chronic with expected duration over 1 year. A portion of actinic keratoses will progress to squamous cell carcinoma of the skin. It is not possible to reliably predict which spots will progress to skin cancer and so treatment is recommended to prevent development of skin cancer.  Recommend daily broad spectrum sunscreen SPF 30+ to sun-exposed areas, reapply every 2 hours as needed.  Recommend staying in the shade or wearing long sleeves, sun glasses (UVA+UVB protection) and wide brim hats (4-inch brim around the entire circumference of the hat). Call for new or changing lesions. - Start 5-fluorouracil cream twice a day for 14 days to affected areas including face and left forearm Patient Handout: Wound Care for Skin Biopsy Site  Taking Care of Your Skin Biopsy  Site  Proper care of the biopsy site is essential for promoting healing and minimizing scarring. This handout provides instructions on how to care for your biopsy site to ensure optimal recovery.  1. Cleaning the Wound:  Clean the biopsy site daily with gentle soap and water. Gently pat the area dry with a clean, soft towel. Avoid harsh scrubbing or rubbing the area, as this can irritate the skin and delay healing.  2. Applying Aquaphor and Bandage:  After cleaning the wound, apply a thin layer of Aquaphor ointment to the biopsy site. Cover the area with a sterile bandage to protect it from dirt, bacteria, and friction. Change the bandage daily or as needed if it becomes soiled or wet.  3. Continued Care for One Week:  Repeat the cleaning, Aquaphor application, and bandaging process daily for one week following the biopsy procedure. Keeping the wound clean and moist during this initial healing period will help prevent infection and promote optimal healing.  4. Massaging Aquaphor into the Area:  ---After one week, discontinue the use of bandages but continue to apply Aquaphor to the  biopsy site. ----Gently massage the Aquaphor into the area using circular motions. ---Massaging the skin helps to promote circulation and prevent the formation of scar tissue.   Additional Tips:  Avoid exposing the biopsy site to direct sunlight during the healing process, as this can cause hyperpigmentation or worsen scarring. If you experience any signs of infection, such as increased redness, swelling, warmth, or drainage from the wound, contact your healthcare provider immediately. Follow any additional instructions provided by your healthcare provider for caring for the biopsy site and managing any discomfort. Conclusion:  Taking proper care of your skin biopsy site is crucial for ensuring optimal healing and minimizing scarring. By following these instructions for cleaning, applying Aquaphor, and  massaging the area, you can promote a smooth and successful recovery. If you have any questions or concerns about caring for your biopsy site, don't hesitate to contact your healthcare provider for guidance.  .  Reviewed course of treatment and expected reaction.  Patient advised to expect inflammation and crusting and advised that erosions are possible.  Patient advised to be diligent with sun protection during and after treatment. Handout with details of how to apply medication and what to expect provided. Counseled to keep medication out of reach of children and pets.

## 2023-08-19 NOTE — Progress Notes (Signed)
 New Patient Visit   Subjective  Melissa Mcgrath is a 63 y.o. female who presents for the following: Skin Cancer Screening and Full Body Skin Exam. Hx of NMSC. No family hx of skin cancer.  The patient presents for Total-Body Skin Exam (TBSE) for skin cancer screening and mole check. The patient has spots, moles and lesions to be evaluated, some may be new or changing.  The following portions of the chart were reviewed this encounter and updated as appropriate: medications, allergies, medical history  Review of Systems:  No other skin or systemic complaints except as noted in HPI or Assessment and Plan.  Objective  Well appearing patient in no apparent distress; mood and affect are within normal limits.  A full examination was performed including scalp, head, eyes, ears, nose, lips, neck, chest, axillae, abdomen, back, buttocks, bilateral upper extremities, bilateral lower extremities, hands, feet, fingers, toes, fingernails, and toenails. All findings within normal limits unless otherwise noted below.   Relevant physical exam findings are noted in the Assessment and Plan.  Left Lower Leg - Posterior 8 mm irregularly shape pink brown papule   Assessment & Plan   SKIN CANCER SCREENING PERFORMED TODAY.  ACTINIC DAMAGE - Chronic condition, secondary to cumulative UV/sun exposure - diffuse scaly erythematous macules with underlying dyspigmentation - Recommend daily broad spectrum sunscreen SPF 30+ to sun-exposed areas, reapply every 2 hours as needed.  - Staying in the shade or wearing long sleeves, sun glasses (UVA+UVB protection) and wide brim hats (4-inch brim around the entire circumference of the hat) are also recommended for sun protection.  - Call for new or changing lesions.  LENTIGINES, SEBORRHEIC KERATOSES, HEMANGIOMAS - Benign normal skin lesions - Benign-appearing - Call for any changes  MELANOCYTIC NEVI - Tan-brown and/or pink-flesh-colored symmetric macules and  papules - Benign appearing on exam today - Observation - Call clinic for new or changing moles - Recommend daily use of broad spectrum spf 30+ sunscreen to sun-exposed areas.   HAND DERMATITIS Exam Scaly pink plaques +/- fissures  Flared  Hand Dermatitis is a chronic type of eczema that can come and go on the hands and fingers.  While there is no cure, the rash and symptoms can be managed with topical prescription medications, and for more severe cases, with systemic medications.  Recommend mild soap and routine use of moisturizing cream after handwashing.  Minimize soap/water exposure when possible.    Treatment Plan - Clobetasol ointment - Emollients - Will consider Dupixent at next visit if it hasn't improved  ACTINIC KERATOSIS Exam: Erythematous thin papules/macules with gritty scale at the left cheek, right nasolabial fold, left eyebrow and left forearm  Actinic keratoses are precancerous spots that appear secondary to cumulative UV radiation exposure/sun exposure over time. They are chronic with expected duration over 1 year. A portion of actinic keratoses will progress to squamous cell carcinoma of the skin. It is not possible to reliably predict which spots will progress to skin cancer and so treatment is recommended to prevent development of skin cancer.  Recommend daily broad spectrum sunscreen SPF 30+ to sun-exposed areas, reapply every 2 hours as needed.  Recommend staying in the shade or wearing long sleeves, sun glasses (UVA+UVB protection) and wide brim hats (4-inch brim around the entire circumference of the hat). Call for new or changing lesions.  Treatment Plan: Start 5-fluorouracil cream twice a day for 14 days to affected areas including left cheek, right nasolabial fold, and left eyebrow.  Reviewed course of  treatment and expected reaction.  Patient advised to expect inflammation and crusting and advised that erosions are possible.  Patient advised to be diligent  with sun protection during and after treatment. Handout with details of how to apply medication and what to expect provided. Counseled to keep medication out of reach of children and pets.  Reviewed course of treatment and expected reaction.  Patient advised to expect inflammation and crusting and advised that erosions are possible.  Patient advised to be diligent with sun protection during and after treatment. Handout with details of how to apply medication and what to expect provided. Counseled to keep medication out of reach of children and pets.  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 We discussed that daily suppressive therapy can help reduce the frequency and severity of outbreaks, as well as lower the risk of viral shedding and transmission. The patient was advised on the recommended dosing regimen and the importance of adherence for optimal effectiveness. Potential side effects were reviewed, and the patient was instructed to monitor for any adverse reactions. We will reassess the need for continued prophylaxis at future follow-ups.  AK (ACTINIC KERATOSIS) (3) Left Forearm - Posterior, Left Malar Cheek, Right Buccal Cheek Related Medications fluorouracil (EFUDEX) 5 % cream Apply topically 2 (two) times daily for 14 days. Apply 2x per day to face for 14 days PIGMENTED ACTINIC KERATOSIS (3) Right Shoulder - Posterior, Right Upper Arm - Posterior, Right Upper Back Destruction of lesion - Right Shoulder - Posterior, Right Upper Arm - Posterior, Right Upper Back Complexity: extensive   Destruction method: cryotherapy   Informed consent: discussed and consent obtained   Timeout:  patient name, date of birth, surgical site, and procedure verified Lesion destroyed using liquid nitrogen: Yes   Region  frozen until ice ball extended beyond lesion: Yes   Cryotherapy cycles:  2 Outcome: patient tolerated procedure well with no complications   Post-procedure details: wound care instructions given   NEOPLASM OF UNCERTAIN BEHAVIOR OF SKIN Left Lower Leg - Posterior Skin / nail biopsy Type of biopsy: tangential   Informed consent: discussed and consent obtained   Timeout: patient name, date of birth, surgical site, and procedure verified   Procedure prep:  Patient was prepped and draped in usual sterile fashion Prep type:  Isopropyl alcohol Anesthesia: the lesion was anesthetized in a standard fashion   Anesthetic:  1% lidocaine w/ epinephrine 1-100,000 buffered w/ 8.4% NaHCO3 Instrument used: DermaBlade   Hemostasis achieved with: aluminum chloride   Outcome: patient tolerated procedure well   Post-procedure details: sterile dressing applied and wound care instructions given   Dressing type: bandage and petrolatum   Specimen 1 - Surgical pathology Differential Diagnosis: r/o MM VS DM VS SK  Check Margins: No ECZEMA, UNSPECIFIED TYPE Left Hand - Posterior, Right Hand - Posterior Related Medications clobetasol ointment (TEMOVATE) 0.05 % Apply 1 Application topically 2 (two) times daily. HERPES LABIALIS [place] Lip valACYclovir (VALTREX) 500 MG tablet - [place] Lip Take 1 tablet (500 mg total) by mouth daily.  Return in about 2 months (around 10/17/2023) for F/U.  Dominga Ferry, Surg Tech III, am acting as scribe for Gwenith Daily, MD.   Documentation: I have reviewed the above documentation for accuracy and completeness, and I agree with the above.  Lanett Lasorsa M Casanova Schurman,  MD

## 2023-08-20 LAB — SURGICAL PATHOLOGY

## 2023-08-26 ENCOUNTER — Telehealth: Payer: Self-pay | Admitting: Dermatology

## 2023-08-26 NOTE — Telephone Encounter (Signed)
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

## 2023-08-26 NOTE — Telephone Encounter (Signed)
 Called and lvm to call back to go over results.

## 2023-08-27 ENCOUNTER — Telehealth: Payer: Self-pay

## 2023-08-27 NOTE — Telephone Encounter (Signed)
 Advised patient of results and will schedule with Dr Caralyn Guile for excision.

## 2023-09-01 ENCOUNTER — Telehealth: Payer: Self-pay | Admitting: Dermatology

## 2023-09-01 NOTE — Telephone Encounter (Signed)
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

## 2023-09-01 NOTE — Telephone Encounter (Signed)
 Called ans lvm to call us back.

## 2023-09-09 ENCOUNTER — Inpatient Hospital Stay: Payer: 59 | Attending: Hematology and Oncology

## 2023-09-09 ENCOUNTER — Inpatient Hospital Stay (HOSPITAL_BASED_OUTPATIENT_CLINIC_OR_DEPARTMENT_OTHER): Payer: 59 | Admitting: Hematology and Oncology

## 2023-09-09 VITALS — BP 128/54 | HR 67 | Temp 97.9°F | Resp 16 | Ht 68.0 in | Wt 141.0 lb

## 2023-09-09 DIAGNOSIS — Z170 Estrogen receptor positive status [ER+]: Secondary | ICD-10-CM | POA: Diagnosis not present

## 2023-09-09 DIAGNOSIS — Z1732 Human epidermal growth factor receptor 2 negative status: Secondary | ICD-10-CM | POA: Insufficient documentation

## 2023-09-09 DIAGNOSIS — Z9013 Acquired absence of bilateral breasts and nipples: Secondary | ICD-10-CM | POA: Insufficient documentation

## 2023-09-09 DIAGNOSIS — M25511 Pain in right shoulder: Secondary | ICD-10-CM | POA: Insufficient documentation

## 2023-09-09 DIAGNOSIS — Z79811 Long term (current) use of aromatase inhibitors: Secondary | ICD-10-CM | POA: Insufficient documentation

## 2023-09-09 DIAGNOSIS — Z1721 Progesterone receptor positive status: Secondary | ICD-10-CM | POA: Insufficient documentation

## 2023-09-09 DIAGNOSIS — C50411 Malignant neoplasm of upper-outer quadrant of right female breast: Secondary | ICD-10-CM | POA: Diagnosis present

## 2023-09-09 DIAGNOSIS — I890 Lymphedema, not elsewhere classified: Secondary | ICD-10-CM | POA: Diagnosis not present

## 2023-09-09 LAB — CBC WITH DIFFERENTIAL (CANCER CENTER ONLY)
Abs Immature Granulocytes: 0.01 10*3/uL (ref 0.00–0.07)
Basophils Absolute: 0.1 10*3/uL (ref 0.0–0.1)
Basophils Relative: 2 %
Eosinophils Absolute: 0.2 10*3/uL (ref 0.0–0.5)
Eosinophils Relative: 4 %
HCT: 34.3 % — ABNORMAL LOW (ref 36.0–46.0)
Hemoglobin: 11.6 g/dL — ABNORMAL LOW (ref 12.0–15.0)
Immature Granulocytes: 0 %
Lymphocytes Relative: 24 %
Lymphs Abs: 1.1 10*3/uL (ref 0.7–4.0)
MCH: 34.1 pg — ABNORMAL HIGH (ref 26.0–34.0)
MCHC: 33.8 g/dL (ref 30.0–36.0)
MCV: 100.9 fL — ABNORMAL HIGH (ref 80.0–100.0)
Monocytes Absolute: 0.5 10*3/uL (ref 0.1–1.0)
Monocytes Relative: 10 %
Neutro Abs: 2.8 10*3/uL (ref 1.7–7.7)
Neutrophils Relative %: 60 %
Platelet Count: 201 10*3/uL (ref 150–400)
RBC: 3.4 MIL/uL — ABNORMAL LOW (ref 3.87–5.11)
RDW: 12.9 % (ref 11.5–15.5)
WBC Count: 4.6 10*3/uL (ref 4.0–10.5)
nRBC: 0 % (ref 0.0–0.2)

## 2023-09-09 LAB — CMP (CANCER CENTER ONLY)
ALT: 10 U/L (ref 0–44)
AST: 15 U/L (ref 15–41)
Albumin: 4.3 g/dL (ref 3.5–5.0)
Alkaline Phosphatase: 66 U/L (ref 38–126)
Anion gap: 6 (ref 5–15)
BUN: 15 mg/dL (ref 8–23)
CO2: 27 mmol/L (ref 22–32)
Calcium: 9.7 mg/dL (ref 8.9–10.3)
Chloride: 107 mmol/L (ref 98–111)
Creatinine: 1 mg/dL (ref 0.44–1.00)
GFR, Estimated: >60 mL/min (ref >60)
Glucose, Bld: 89 mg/dL (ref 70–99)
Potassium: 4 mmol/L (ref 3.5–5.1)
Sodium: 140 mmol/L (ref 135–145)
Total Bilirubin: 0.5 mg/dL (ref 0.0–1.2)
Total Protein: 7.3 g/dL (ref 6.5–8.1)

## 2023-09-09 NOTE — Assessment & Plan Note (Signed)
 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%, PR 2%, HER-2 not amplified, with an MIB-1 of 40% T2N2 Stage 3A Neoadj chemo with AC-T completed 10/26/20 Bil mastectomies 04/25/21: Rt: 1.6 cm, grade 3, Deep margin pos, Perineural inv present, 0/24 Axln, 0/2 Intra mamm LN Neg  Adj XRT completed 07/24/21 ------------------------------------------------------------------------------------------------------------------------------------------------  Treatment Plan: Adj Anti estrogen therapy with letrozole started 07/10/2021 and abemaciclib started 11/09/2021 completed May 2025 Letrozole toxicities: No side effects   Verzinio toxicities:  Diarrhea: Trying to stay hydrated. Louann Liv it was bad. Staying at 50 bid Takes Imodium Fatigue Mild leukopenia: Monitoring I recommended discontinuation of Verzinio therapy since she completed 2 years of treatment.  01/14/2022: CT chest: Stable radiation changes, prior bilateral mastectomies, no findings of metastatic disease. Breast cancer surveillance: Chest wall examination does not reveal any lumps or nodules.   Breast cancer surveillance: No role of imaging since she had bilateral mastectomies Chest examination: Benign Recommended guardant reveal testing for MRD  Return to clinic in 1 year for follow-up

## 2023-09-09 NOTE — Progress Notes (Signed)
 Patient Care Team: Park Meo, FNP as PCP - General (Family Medicine) Antony Blackbird, MD as Consulting Physician (Radiation Oncology) Marcelle Overlie, MD as Consulting Physician (Obstetrics and Gynecology) Bettey Costa, MD as Consulting Physician (Dermatology) Serena Croissant, MD as Consulting Physician (Hematology and Oncology) Anselm Lis, RPH-CPP as Pharmacist (Hematology and Oncology)  DIAGNOSIS:  Encounter Diagnosis  Name Primary?   Malignant neoplasm of upper-outer quadrant of right breast in female, estrogen receptor positive (HCC) Yes    SUMMARY OF ONCOLOGIC HISTORY: Oncology History  Malignant neoplasm of upper-outer quadrant of right breast in female, estrogen receptor positive (HCC)  08/21/2020 Initial Diagnosis   right breast upper outer quadrant biopsy 08/21/2020 for a clinical mT2 N1, stage IIA/B invasive ductal carcinoma, grade 3, estrogen receptor positive, progesterone receptor weakly positive, HER-2 not amplified, with an MIB-1 of 40%   08/29/2020 Cancer Staging   Staging form: Breast, AJCC 8th Edition - Clinical stage from 08/29/2020: Stage IIIA (cT2, cN2, cM0, G3, ER+, PR+, HER2-) - Signed by Loa Socks, NP on 09/07/2020 Stage prefix: Initial diagnosis Stage used in treatment planning: Yes National guidelines used in treatment planning: Yes Type of national guideline used in treatment planning: NCCN   09/18/2020 - 03/01/2021 Chemotherapy   AC X 4 foll by Taxol X 12    09/18/2020 Genetic Testing   Negative genetic testing:  No pathogenic variants detected on the [facility] CancerNext-Expanded + RNAinsight panel. The report date is 09/18/2020.   The CancerNext-Expanded + RNAinsight gene panel offered by [employer] and includes sequencing and rearrangement analysis for the following 77 genes: AIP, ALK, APC, ATM, AXIN2, BAP1, BARD1, BLM, BMPR1A, BRCA1, BRCA2, BRIP1, CDC73, CDH1, CDK4, CDKN1B, CDKN2A, CHEK2, CTNNA1, DICER1, FANCC, FH, FLCN, GALNT12,  KIF1B, LZTR1, MAX, MEN1, MET, MLH1, MSH2, MSH3, MSH6, MUTYH, NBN, NF1, NF2, NTHL1, PALB2, PHOX2B, PMS2, POT1, PRKAR1A, PTCH1, PTEN, RAD51C, RAD51D, RB1, RECQL, RET, SDHA, SDHAF2, SDHB, SDHC, SDHD, SMAD4, SMARCA4, SMARCB1, SMARCE1, STK11, SUFU, TMEM127, TP53, TSC1, TSC2, VHL and XRCC2 (sequencing and deletion/duplication); EGFR, EGLN1, HOXB13, KIT, MITF, PDGFRA, POLD1 and POLE (sequencing only); EPCAM and GREM1 (deletion/duplication only). RNA data is routinely analyzed for use in variant interpretation for all genes.    Surgery   bilateral mastectomies with right axillary lymph nodes dissection on 04/25/2021 showing (A) on the left, no evidence of malignancy (B) on the right a residual ypT1c ypN0 invasive ductal carcinoma, grade 3, with a focally positive deep margin a total of 24 right axillary and 2 right intramammary lymph nodes were removed, all clear   06/12/2021 - 07/24/2021 Radiation Therapy   Adj XRT   06/2021 -  Anti-estrogen oral therapy   Letrozole beginning 06/2021; Verzenio added 11/09/2021; changed to anastrozole 02/2023      CHIEF COMPLIANT: Follow-up on anastrozole therapy  HISTORY OF PRESENT ILLNESS:  History of Present Illness The patient, with a history of breast cancer, has completed two years of anastrozole treatment. She reports that her diarrhea, which was a persistent issue, has improved in the last few months, becoming sporadic rather than constant. The patient also has a finger infection for which she is taking Keflex. She is also on daily Valtrex due to frequent sores, likely due to her outdoor activities. The patient also reports a shoulder issue, possibly a tear in the rotator cuff, which has been causing discomfort and limiting her ability to perform certain tasks. The patient is considering taking some supplements and has brought the information for the doctor to review.  ALLERGIES:  is allergic to peanut-containing drug products.  MEDICATIONS:  Current  Outpatient Medications  Medication Sig Dispense Refill   albuterol (VENTOLIN HFA) 108 (90 Base) MCG/ACT inhaler Inhale 1-2 puffs into the lungs every 6 (six) hours as needed for wheezing or shortness of breath. 8 g 0   anastrozole (ARIMIDEX) 1 MG tablet Take 1 tablet (1 mg total) by mouth daily. 90 tablet 3   meloxicam (MOBIC) 15 MG tablet Take 1 tablet (15 mg total) by mouth daily. 30 tablet 1   valACYclovir (VALTREX) 500 MG tablet Take 1 tablet (500 mg total) by mouth daily. 90 tablet 1   VERZENIO 50 MG tablet TAKE 1 TABLET BY MOUTH 2 TIMES A DAY. SWALLOW TABLETS WHOLE. DO NOT CHEW, CRUSH, OR SPLIT TABLETS BEFORE SWALLOWING. 56 tablet 3   No current facility-administered medications for this visit.    PHYSICAL EXAMINATION: ECOG PERFORMANCE STATUS: 1 - Symptomatic but completely ambulatory  Vitals:   09/09/23 1147  BP: (!) 128/54  Pulse: 67  Resp: 16  Temp: 97.9 F (36.6 C)  SpO2: 100%   Filed Weights   09/09/23 1147  Weight: 141 lb (64 kg)    Physical Exam No palpable lumps or nodules in bilateral chest wall or axilla  (exam performed in the presence of a chaperone)  LABORATORY DATA:  I have reviewed the data as listed    Latest Ref Rng & Units 09/09/2023   11:03 AM 07/01/2023    1:15 PM 02/05/2023   10:27 AM  CMP  Glucose 70 - 99 mg/dL 89  88  87   BUN 8 - 23 mg/dL 15  13  10    Creatinine 0.44 - 1.00 mg/dL 4.09  8.11  9.14   Sodium 135 - 145 mmol/L 140  142  140   Potassium 3.5 - 5.1 mmol/L 4.0  3.9  4.0   Chloride 98 - 111 mmol/L 107  107  105   CO2 22 - 32 mmol/L 27  29  29    Calcium 8.9 - 10.3 mg/dL 9.7  9.5  9.3   Total Protein 6.5 - 8.1 g/dL 7.3  7.2  7.0   Total Bilirubin 0.0 - 1.2 mg/dL 0.5  0.5  0.5   Alkaline Phos 38 - 126 U/L 66  71  69   AST 15 - 41 U/L 15  16  14    ALT 0 - 44 U/L 10  10  9      Lab Results  Component Value Date   WBC 4.6 09/09/2023   HGB 11.6 (L) 09/09/2023   HCT 34.3 (L) 09/09/2023   MCV 100.9 (H) 09/09/2023   PLT 201 09/09/2023    NEUTROABS 2.8 09/09/2023    ASSESSMENT & PLAN:  Malignant neoplasm of upper-outer quadrant of right breast in female, estrogen receptor positive (HCC) 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%, PR 2%, HER-2 not amplified, with an MIB-1 of 40% T2N2 Stage 3A Neoadj chemo with AC-T completed 10/26/20 Bil mastectomies 04/25/21: Rt: 1.6 cm, grade 3, Deep margin pos, Perineural inv present, 0/24 Axln, 0/2 Intra mamm LN Neg  Adj XRT completed 07/24/21 ------------------------------------------------------------------------------------------------------------------------------------------------  Treatment Plan: Adj Anti estrogen therapy with letrozole started 07/10/2021 and abemaciclib started 11/09/2021 completed May 2025 Letrozole toxicities: No side effects I recommended discontinuation of Verzinio therapy since she completed 2 years of treatment.  01/14/2022: CT chest: Stable radiation changes, prior bilateral mastectomies, no findings  of metastatic disease. Breast cancer surveillance: Chest wall examination does not reveal any lumps or nodules.   Breast cancer surveillance: No role of imaging since she had bilateral mastectomies Chest examination 09/09/2023: Benign Recommended guardant reveal testing for MRD  Return to clinic in 1 year for follow-up  Assessment & Plan Malignant neoplasm of upper-outer quadrant of right breast, estrogen receptor positive Two years post-treatment for estrogen receptor-positive breast cancer. Discussed potential cancer recurrence and [facility] Reveal blood test for early detection of cancer DNA. - Continue anastrozole therapy. - Perform [facility] Reveal blood test every six months. - Provide brochure about the [facility] Reveal test. - Inform her about home testing and insurance coverage.  Right shoulder pain Confirmed rotator cuff tear. Physical therapy not fully effective.  Discussed potential surgery and lymphedema risk. - Refer to orthopedic specialist for further evaluation and potential interventions.  Mild lymphedema of the right arm Mild lymphedema present. Risk manageable with sufficient lymph nodes remaining. - Monitor lymphedema status, especially if shoulder surgery is pursued.  Follow-up She will be contacted for [facility] Reveal test scheduling and results. - Schedule follow-up appointment based on test results and condition management.      No orders of the defined types were placed in this encounter.  The patient has a good understanding of the overall plan. she agrees with it. she will call with any problems that may develop before the next visit here. Total time spent: 30 mins including face to face time and time spent for planning, charting and co-ordination of care   Tamsen Meek, MD 09/09/23

## 2023-09-10 ENCOUNTER — Ambulatory Visit: Payer: Self-pay

## 2023-09-10 NOTE — Telephone Encounter (Signed)
 Copied from CRM [id number]. Topic: Clinical - Red Word Triage >> Sep 10, 2023 10:58 AM Elle L wrote: Red Word that prompted transfer to Nurse Triage: The patient advised that she is having worsening shoulder pain. She was in Physical Therapy for it last year which helped temporarily but the pain has now come back and worsens when she uses her shoulder.   Chief Complaint: Pain Symptoms: Right shoulder pain Frequency: Chronic Pertinent Negatives: Patient denies any other symptoms Disposition: [] ED /[] Urgent Care (no appt availability in office) / [x] Appointment(In office/virtual)/ []  [place] [facility]/ [] Home Care/ [] Refused Recommended Disposition /[] [facility]/ []  Follow-up with PCP Additional Notes: Patient stated that she has been having chronic right shoulder pain. She feels like it has been getting worse in the past month. She requested to schedule appt with Joice Lofts, FNP.  Appointment scheduled for 3/31.   Reason for Disposition  Shoulder pain is a chronic symptom (recurrent or ongoing AND present > 4 weeks)  Answer Assessment - Initial Assessment Questions 1. ONSET: "When did the pain start?"     Fall 2024, worsening within the past month  2. LOCATION: "Where is the pain located?"     R shoulder  3. PAIN: "How bad is the pain?" (Scale 1-10; or mild, moderate, severe)   - MILD (1-3): doesn't interfere with normal activities   - MODERATE (4-7): interferes with normal activities (e.g., work or school) or awakens from sleep   - SEVERE (8-10): excruciating pain, unable to do any normal activities, unable to move arm at all due to pain     2/10 currently. Worse when doing activity. Pain is manageable with OTC medication  4. CAUSE: "What do you think is causing the shoulder pain?"     Patient stated she saw a sports medicine provider in the past and was told that she had a meniscus and rotator cuff tear  5. OTHER SYMPTOMS: "Do you have any other symptoms?" (e.g., neck pain,  swelling, rash, fever, numbness, weakness)     No other symptoms reported  Protocols used: Shoulder Pain-A-AH

## 2023-09-20 ENCOUNTER — Encounter: Payer: Self-pay | Admitting: Hematology and Oncology

## 2023-09-21 ENCOUNTER — Ambulatory Visit: Admitting: Family Medicine

## 2023-09-23 ENCOUNTER — Other Ambulatory Visit: Payer: Self-pay

## 2023-09-23 ENCOUNTER — Encounter: Payer: Self-pay | Admitting: Orthopedic Surgery

## 2023-09-23 ENCOUNTER — Ambulatory Visit: Admitting: Orthopedic Surgery

## 2023-09-23 DIAGNOSIS — M25511 Pain in right shoulder: Secondary | ICD-10-CM

## 2023-09-23 DIAGNOSIS — G8929 Other chronic pain: Secondary | ICD-10-CM | POA: Diagnosis not present

## 2023-09-23 DIAGNOSIS — M7501 Adhesive capsulitis of right shoulder: Secondary | ICD-10-CM | POA: Diagnosis not present

## 2023-09-24 ENCOUNTER — Encounter: Payer: Self-pay | Admitting: Hematology and Oncology

## 2023-09-24 ENCOUNTER — Encounter: Payer: Self-pay | Admitting: Orthopedic Surgery

## 2023-09-25 ENCOUNTER — Encounter: Payer: Self-pay | Admitting: *Deleted

## 2023-09-25 NOTE — Progress Notes (Signed)
Per MD request, RN successfully faxed [facility] Reveal orders.

## 2023-09-26 ENCOUNTER — Encounter: Payer: Self-pay | Admitting: Orthopedic Surgery

## 2023-09-26 MED ORDER — BUPIVACAINE HCL 0.5 % IJ SOLN
9.0000 mL | INTRAMUSCULAR | Status: AC | PRN
Start: 2023-09-23 — End: 2023-09-23
  Administered 2023-09-23: 9 mL via INTRA_ARTICULAR

## 2023-09-26 MED ORDER — METHYLPREDNISOLONE ACETATE 40 MG/ML IJ SUSP
40.0000 mg | INTRAMUSCULAR | Status: AC | PRN
Start: 2023-09-23 — End: 2023-09-23
  Administered 2023-09-23: 40 mg via INTRA_ARTICULAR

## 2023-09-26 MED ORDER — LIDOCAINE HCL 1 % IJ SOLN
5.0000 mL | INTRAMUSCULAR | Status: AC | PRN
Start: 2023-09-23 — End: 2023-09-23
  Administered 2023-09-23: 5 mL

## 2023-09-26 NOTE — Progress Notes (Signed)
 Office Visit Note   Patient: Melissa Mcgrath           Date of Birth: 1961-06-16           MRN: [id number] Visit Date: 09/23/2023 Requested by: Serena Croissant, MD [address] [place],  [place] [id number] PCP: Park Meo, FNP  Subjective: Chief Complaint  Patient presents with   Right Shoulder - Pain    HPI: Melissa Mcgrath is a 63 y.o. female who presents to the office reporting right shoulder pain.  This has been going on since August 2024.  No known injury.  Describes relatively constant pain in the shoulder region.  Has tried physical therapy for 3 to 4 months with only minimal relief.  She is right-hand dominant.  Pain does wake her from sleep at night.  She reports catching.  Pain localizes primarily anterior.  No prior right shoulder surgery.  She has had some dry needling done in the axilla.  Feels tight in this area because of prior bilateral mastectomy.  She does report some catching with crossarm adduction as well as decreased range of motion.  Denies much in the way of neck symptoms or numbness and tingling or radicular symptoms.  He uses Mobic occasionally as well as nitroglycerin patch.  She is getting a new horse soon.  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                ROS: All systems reviewed are negative as they relate to the chief complaint within the history of present illness.  Patient denies fevers or chills.  Assessment & Plan: Visit Diagnoses:  1. Chronic right shoulder pain     Plan: Impression is right shoulder pain in a patient who is very active.  She does have a little restricted range of motion on that right-hand side so it could be early adhesive capsulitis.  Rotator cuff strength is pretty good.  Plan at this time is ultrasound-guided injection into the glenohumeral joint.  26-month return with decision for or against MRI scan at that time.  Radiographs are negative for any discrete bony pathology.  They were done about 6  months ago.  Follow-Up Instructions: No follow-ups on file.   Orders:  Orders Placed This Encounter  Procedures   US Guided Needle Placement - No Linked Charges   No orders of the defined types were placed in this encounter.     Procedures: Large Joint Inj: R glenohumeral on 09/23/2023 4:21 PM Indications: diagnostic evaluation and pain Details: 22 G 3.5 in needle, ultrasound-guided posterior approach  Arthrogram: No  Medications: 9 mL bupivacaine 0.5 %; 40 mg methylPREDNISolone acetate 40 MG/ML; 5 mL lidocaine 1 % Outcome: tolerated well, no immediate complications Procedure, treatment alternatives, risks and benefits explained, specific risks discussed. Consent was given by the patient. Immediately prior to procedure a time out was called to verify the correct patient, procedure, equipment, support staff and site/side marked as required. Patient was prepped and draped in the usual sterile fashion.       Clinical Data: No additional findings.  Objective: Vital Signs: There were no vitals taken for this visit.  Physical Exam:  Constitutional: Patient appears well-developed HEENT:  Head: Normocephalic Eyes:EOM are normal Neck: Normal range of motion Cardiovascular: Normal rate Pulmonary/chest: Effort normal Neurologic: Patient is alert Skin: Skin is warm Psychiatric: Patient has normal mood and affect  Ortho Exam: Ortho exam demonstrates passive range of motion  on the right to 40/90/165 range of motion passive on the left is 70/100/180.  Rotator cuff strength intact infraspinatus supraspinatus and subscap muscle testing.  No discrete AC joint tenderness right versus left shoulder.  No coarse grinding with internal or external rotation at 90 degrees of abduction.  No masses lymphadenopathy or skin changes noted in that shoulder girdle region. Patient has bilateral 5 out of 5 grip EPL FPL interosseous wrist flexion wrist extension bicep triceps and deltoid strength.   Bilateral palpable radial pulses and no paresthesias C5-T1 in either arm.  Neck range of motion flexion chin to chest with extension approximately 50 degrees with approximately 50 degrees of rotation bilaterally.  No masses lymphadenopathy or skin changes around the neck or shoulder girdle region bilaterally   Specialty Comments:  No specialty comments available.  Imaging: No results found.   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   Genetic testing 09/18/2020   History of augmentation mammoplasty 08/31/2020   Family history of breast cancer    Family history of pancreatic cancer    Family history of melanoma    Family history of ovarian cancer    Malignant neoplasm of upper-outer quadrant of right breast in female, estrogen receptor positive (HCC) 08/28/2020   Vaginal atrophy 11/09/2014   Menopause 01/05/2014   Past Medical History:  Diagnosis Date   Anemia    Depression 01/05/2014   Facial basal cell cancer    Family history of breast cancer    Family history of melanoma    Family history of ovarian cancer    Family history of pancreatic cancer    History of radiation therapy    right chest wall and subclavian 06/11/2021-07/26/2021  Dr Antony Blackbird   Menopause 01/05/2014   Port-A-Cath in place 10/04/2020   Squamous cell carcinoma of skin    Left lower leg - insitu - needs excision   Vaginal atrophy 11/09/2014    Family History  Problem Relation Age of Onset   Breast cancer Mother 38       breast   Hypertension Father    Heart attack Father    Alzheimer's disease Father    Fibromyalgia Sister    Diabetes Sister    Heart attack  Sister    Hypertension Sister    Hypertension Brother    Diabetes Brother    Pancreatic cancer Maternal Grandmother 26       Pancreatic   Melanoma Paternal Uncle        dx 71s   Melanoma Paternal Uncle        dx 51s   Ovarian cancer Paternal Aunt        dx 50s/60s    Past Surgical History:  Procedure Laterality Date   BILATERAL TOTAL MASTECTOMY WITH AXILLARY LYMPH NODE DISSECTION Right 04/25/2021   Procedure: RIGHT TOTAL MASTECTOMY WITH RIGHT AXILLARY LYMPH NODE DISSECTION;  Surgeon: Almond Lint, MD;  Location: [facility] OR;  Service: General;  Laterality: Right;   BREAST ENHANCEMENT SURGERY     BREAST IMPLANT REMOVAL Bilateral 04/25/2021   Procedure: REMOVAL BREAST IMPLANTS;  Surgeon: Almond Lint, MD;  Location: [facility] OR;  Service: General;  Laterality: Bilateral;  CARPAL TUNNEL RELEASE Right    PORT-A-CATH REMOVAL N/A 10/15/2021   Procedure: REMOVAL PORT-A-CATH;  Surgeon: Almond Lint, MD;  Location: [facility] OR;  Service: General;  Laterality: N/A;   PORTACATH PLACEMENT N/A 09/17/2020   Procedure: INSERTION PORT-A-CATH;  Surgeon: Almond Lint, MD;  Location: [facility];  Service: General;  Laterality: N/A;   refractive lensectomy Bilateral    SCAR REVISION Left 10/15/2021   Procedure: REVISION OF LEFT MASTECTOMY SCAR;  Surgeon: Almond Lint, MD;  Location: [facility] OR;  Service: General;  Laterality: Left;   TOTAL MASTECTOMY Left 04/25/2021   Procedure: LEFT TOTAL MASTECTOMY;  Surgeon: Almond Lint, MD;  Location: [facility] OR;  Service: General;  Laterality: Left;   Social History   Occupational History   Not on file  Tobacco Use   Smoking status: Never   Smokeless tobacco: Never  Vaping Use   Vaping status: Never Used  Substance and Sexual Activity   Alcohol use: No   Drug use: No   Sexual activity: Not Currently    Birth control/protection: Post-menopausal

## 2023-09-26 NOTE — Telephone Encounter (Signed)
 Yes  thx

## 2023-09-28 MED ORDER — MELOXICAM 15 MG PO TABS: 15.0000 mg | ORAL_TABLET | Freq: Every day | ORAL | 1 refills | Status: DC

## 2023-10-01 ENCOUNTER — Encounter: Payer: Self-pay | Admitting: Hematology and Oncology

## 2023-10-13 ENCOUNTER — Telehealth: Payer: Self-pay

## 2023-10-13 NOTE — Telephone Encounter (Signed)
 Called pt per MD to advise guardant testing was negative/not detected. Pt verbalized understanding of results and knows [facility] will be in touch to schedule 6 mo repeat lab.

## 2023-10-14 ENCOUNTER — Encounter: Payer: Self-pay | Admitting: Hematology and Oncology

## 2023-10-15 ENCOUNTER — Encounter: Payer: Self-pay | Admitting: Hematology and Oncology

## 2023-10-21 ENCOUNTER — Ambulatory Visit: Payer: 59 | Admitting: Dermatology

## 2023-11-20 ENCOUNTER — Encounter: Payer: Self-pay | Admitting: Hematology and Oncology

## 2023-11-23 ENCOUNTER — Ambulatory Visit: Admitting: Orthopedic Surgery

## 2024-01-04 ENCOUNTER — Encounter: Payer: Self-pay | Admitting: Adult Health

## 2024-01-05 ENCOUNTER — Ambulatory Visit: Payer: Self-pay

## 2024-01-05 NOTE — Telephone Encounter (Signed)
 FYI Only or Action Required?: FYI only for provider.  Patient was last seen in primary care on 07/21/2023 by Kayla Jeoffrey RAMAN, FNP.  Called Nurse Triage reporting diarrhea/weakness  Symptoms began Saturday.  Interventions attempted: Rest, hydration, or home remedies.  Symptoms are: gradually improving.  Triage Disposition: See Physician Within 24 Hours  Patient/caregiver understands and will follow disposition?: no   Pt is tolerating fluid rehydration. No vomiting since Sunday. No further blackouts. Pt wants to try care advice and will call back to make appt if not better.     Copied from CRM #[id number]. Topic: Clinical - Red Word Triage >> Jan 05, 2024 10:38 AM Shardie S wrote: Red Word that prompted transfer to Nurse Triage: vomiting, diarrhea, fatigue, dehydration Reason for Disposition  [1] SEVERE diarrhea (e.g., 7 or more times / day more than normal) AND [2] present > 24 hours (1 day)  Answer Assessment - Initial Assessment Questions 1. DIARRHEA SEVERITY: How bad is the diarrhea? How many more stools have you had in the past 24 hours than normal?      severe 2. ONSET: When did the diarrhea begin?      Sat night 3. STOOL DESCRIPTION:  How loose or watery is the diarrhea? What is the stool color? Is there any blood or mucous in the stool?     Yellow water 4. VOMITING: Are you also vomiting? If Yes, ask: How many times in the past 24 hours?      Sunday am only  5. ABDOMEN PAIN: Are you having any abdomen pain? If Yes, ask: What does it feel like? (e.g., crampy, dull, intermittent, constant)      Crampy sensation 6. ABDOMEN PAIN SEVERITY: If present, ask: How bad is the pain?  (e.g., Scale 1-10; mild, moderate, or severe)     None  7. ORAL INTAKE: If vomiting, Have you been able to drink liquids? How much liquids have you had in the past 24 hours?     Yes  36 oz water  8. HYDRATION: Any signs of dehydration? (e.g., dry mouth [not just dry lips], too  weak to stand, dizziness, new weight loss) When did you last urinate?     Weakness with standing, last urinated a few minutes ago  9. EXPOSURE: Have you traveled to a foreign country recently? Have you been exposed to anyone with diarrhea? Could you have eaten any food that was spoiled?      10 . ANTIBIOTIC USE: Are you taking antibiotics now or have you taken antibiotics in the past 2 months?       On cancer medicine  11. OTHER SYMPTOMS: Do you have any other symptoms? (e.g., fever, blood in stool)       Blacked out twice with standing on Sunday, weakness  Protocols used: [facility]

## 2024-02-29 ENCOUNTER — Encounter: Payer: Self-pay | Admitting: Hematology and Oncology

## 2024-04-20 ENCOUNTER — Telehealth: Payer: Self-pay

## 2024-04-20 NOTE — Telephone Encounter (Signed)
 Called pt per MD to advise [facility] testing was negative/not detected. Pt verbalized understanding of results and knows [facility] will be in touch to schedule 6 mo repeat lab.

## 2024-04-21 ENCOUNTER — Encounter: Payer: Self-pay | Admitting: Hematology and Oncology

## 2024-04-25 ENCOUNTER — Encounter: Payer: Self-pay | Admitting: Radiology

## 2024-05-13 ENCOUNTER — Other Ambulatory Visit: Payer: Self-pay | Admitting: Adult Health

## 2024-05-22 ENCOUNTER — Other Ambulatory Visit: Payer: Self-pay | Admitting: Orthopedic Surgery

## 2024-06-28 ENCOUNTER — Other Ambulatory Visit: Payer: Self-pay | Admitting: Orthopedic Surgery

## 2024-07-22 ENCOUNTER — Other Ambulatory Visit: Payer: 59

## 2024-07-25 ENCOUNTER — Encounter: Payer: 59 | Admitting: Family Medicine

## 2024-07-26 ENCOUNTER — Other Ambulatory Visit: Payer: Self-pay | Admitting: Orthopedic Surgery

## 2024-08-22 ENCOUNTER — Other Ambulatory Visit

## 2024-09-08 ENCOUNTER — Ambulatory Visit: Admitting: Hematology and Oncology

## 2024-09-08 ENCOUNTER — Other Ambulatory Visit

## 2024-11-07 ENCOUNTER — Other Ambulatory Visit

## 2024-11-16 ENCOUNTER — Encounter: Admitting: Family Medicine
